# Patient Record
Sex: Male | Born: 1955 | Race: White | Hispanic: No | Marital: Married | Smoking: Former smoker
Health system: Southern US, Community
[De-identification: ages and names within clinical notes are randomized; demographics above are authoritative.]

## PROBLEM LIST (undated history)

## (undated) DIAGNOSIS — Z7982 Long term (current) use of aspirin: Secondary | ICD-10-CM

## (undated) DIAGNOSIS — Z955 Presence of coronary angioplasty implant and graft: Secondary | ICD-10-CM

## (undated) DIAGNOSIS — J449 Chronic obstructive pulmonary disease, unspecified: Secondary | ICD-10-CM

## (undated) DIAGNOSIS — K219 Gastro-esophageal reflux disease without esophagitis: Secondary | ICD-10-CM

## (undated) DIAGNOSIS — E785 Hyperlipidemia, unspecified: Secondary | ICD-10-CM

## (undated) DIAGNOSIS — C3490 Malignant neoplasm of unspecified part of unspecified bronchus or lung: Secondary | ICD-10-CM

## (undated) DIAGNOSIS — M1990 Unspecified osteoarthritis, unspecified site: Secondary | ICD-10-CM

## (undated) DIAGNOSIS — I10 Essential (primary) hypertension: Secondary | ICD-10-CM

## (undated) DIAGNOSIS — I700 Atherosclerosis of aorta: Secondary | ICD-10-CM

## (undated) DIAGNOSIS — I2510 Atherosclerotic heart disease of native coronary artery without angina pectoris: Secondary | ICD-10-CM

## (undated) DIAGNOSIS — E876 Hypokalemia: Secondary | ICD-10-CM

## (undated) DIAGNOSIS — N400 Enlarged prostate without lower urinary tract symptoms: Secondary | ICD-10-CM

## (undated) DIAGNOSIS — Z87891 Personal history of nicotine dependence: Secondary | ICD-10-CM

## (undated) DIAGNOSIS — K4020 Bilateral inguinal hernia, without obstruction or gangrene, not specified as recurrent: Secondary | ICD-10-CM

## (undated) DIAGNOSIS — K5790 Diverticulosis of intestine, part unspecified, without perforation or abscess without bleeding: Secondary | ICD-10-CM

## (undated) DIAGNOSIS — R918 Other nonspecific abnormal finding of lung field: Secondary | ICD-10-CM

## (undated) DIAGNOSIS — I219 Acute myocardial infarction, unspecified: Secondary | ICD-10-CM

## (undated) HISTORY — DX: Hypokalemia: E87.6

## (undated) HISTORY — PX: CARDIAC CATHETERIZATION: SHX172

## (undated) HISTORY — DX: Malignant neoplasm of unspecified part of unspecified bronchus or lung: C34.90

## (undated) HISTORY — PX: COLONOSCOPY: SHX174

## (undated) HISTORY — PX: KNEE ARTHROSCOPY: SUR90

## (undated) HISTORY — PX: UPPER GI ENDOSCOPY: SHX6162

## (undated) HISTORY — DX: Personal history of nicotine dependence: Z87.891

---

## 2011-10-02 ENCOUNTER — Ambulatory Visit: Payer: Self-pay | Admitting: Unknown Physician Specialty

## 2014-04-03 DIAGNOSIS — K219 Gastro-esophageal reflux disease without esophagitis: Secondary | ICD-10-CM | POA: Insufficient documentation

## 2014-04-24 DIAGNOSIS — I219 Acute myocardial infarction, unspecified: Secondary | ICD-10-CM

## 2014-04-24 DIAGNOSIS — I2109 ST elevation (STEMI) myocardial infarction involving other coronary artery of anterior wall: Secondary | ICD-10-CM

## 2014-04-24 HISTORY — DX: Acute myocardial infarction, unspecified: I21.9

## 2014-04-24 HISTORY — PX: CORONARY ANGIOPLASTY: SHX604

## 2014-04-24 HISTORY — DX: ST elevation (STEMI) myocardial infarction involving other coronary artery of anterior wall: I21.09

## 2014-05-03 ENCOUNTER — Encounter (HOSPITAL_COMMUNITY): Payer: Self-pay | Admitting: Cardiovascular Disease

## 2014-05-03 ENCOUNTER — Inpatient Hospital Stay (HOSPITAL_COMMUNITY)
Admission: EM | Admit: 2014-05-03 | Discharge: 2014-05-06 | DRG: 247 | Disposition: A | Discharge status: Home / Self Care | Payer: BC Managed Care – PPO | Source: Other Acute Inpatient Hospital | Attending: Cardiovascular Disease | Admitting: Cardiovascular Disease

## 2014-05-03 ENCOUNTER — Emergency Department: Payer: Self-pay | Admitting: Emergency Medicine

## 2014-05-03 ENCOUNTER — Encounter (HOSPITAL_COMMUNITY)
Admission: EM | Disposition: A | Discharge status: Home / Self Care | Payer: MEDICAID | Source: Other Acute Inpatient Hospital | Attending: Cardiovascular Disease

## 2014-05-03 DIAGNOSIS — Z8249 Family history of ischemic heart disease and other diseases of the circulatory system: Secondary | ICD-10-CM

## 2014-05-03 DIAGNOSIS — I2109 ST elevation (STEMI) myocardial infarction involving other coronary artery of anterior wall: Secondary | ICD-10-CM | POA: Insufficient documentation

## 2014-05-03 DIAGNOSIS — E876 Hypokalemia: Secondary | ICD-10-CM

## 2014-05-03 DIAGNOSIS — G4700 Insomnia, unspecified: Secondary | ICD-10-CM

## 2014-05-03 DIAGNOSIS — K219 Gastro-esophageal reflux disease without esophagitis: Secondary | ICD-10-CM | POA: Diagnosis present

## 2014-05-03 DIAGNOSIS — E780 Pure hypercholesterolemia: Secondary | ICD-10-CM | POA: Diagnosis present

## 2014-05-03 DIAGNOSIS — Z955 Presence of coronary angioplasty implant and graft: Secondary | ICD-10-CM

## 2014-05-03 DIAGNOSIS — I2510 Atherosclerotic heart disease of native coronary artery without angina pectoris: Secondary | ICD-10-CM | POA: Diagnosis present

## 2014-05-03 DIAGNOSIS — N400 Enlarged prostate without lower urinary tract symptoms: Secondary | ICD-10-CM | POA: Diagnosis present

## 2014-05-03 DIAGNOSIS — Z87891 Personal history of nicotine dependence: Secondary | ICD-10-CM

## 2014-05-03 DIAGNOSIS — Z9861 Coronary angioplasty status: Secondary | ICD-10-CM

## 2014-05-03 DIAGNOSIS — I10 Essential (primary) hypertension: Secondary | ICD-10-CM | POA: Diagnosis present

## 2014-05-03 DIAGNOSIS — I2102 ST elevation (STEMI) myocardial infarction involving left anterior descending coronary artery: Secondary | ICD-10-CM

## 2014-05-03 DIAGNOSIS — E785 Hyperlipidemia, unspecified: Secondary | ICD-10-CM

## 2014-05-03 DIAGNOSIS — I213 ST elevation (STEMI) myocardial infarction of unspecified site: Secondary | ICD-10-CM

## 2014-05-03 HISTORY — DX: Atherosclerotic heart disease of native coronary artery without angina pectoris: I25.10

## 2014-05-03 HISTORY — DX: Essential (primary) hypertension: I10

## 2014-05-03 HISTORY — DX: Hyperlipidemia, unspecified: E78.5

## 2014-05-03 HISTORY — DX: Gastro-esophageal reflux disease without esophagitis: K21.9

## 2014-05-03 HISTORY — DX: Benign prostatic hyperplasia without lower urinary tract symptoms: N40.0

## 2014-05-03 HISTORY — PX: LEFT HEART CATHETERIZATION WITH CORONARY ANGIOGRAM: SHX5451

## 2014-05-03 LAB — BRAIN NATRIURETIC PEPTIDE: B NATRIURETIC PEPTIDE 5: 20.1 pg/mL (ref 0.0–100.0)

## 2014-05-03 LAB — CBC WITH DIFFERENTIAL/PLATELET
BASOS PCT: 0 % (ref 0–1)
Basophils Absolute: 0 10*3/uL (ref 0.0–0.1)
EOS ABS: 0 10*3/uL (ref 0.0–0.7)
Eosinophils Relative: 0 % (ref 0–5)
HEMATOCRIT: 39.1 % (ref 39.0–52.0)
Hemoglobin: 13.3 g/dL (ref 13.0–17.0)
LYMPHS ABS: 1.3 10*3/uL (ref 0.7–4.0)
Lymphocytes Relative: 15 % (ref 12–46)
MCH: 31.4 pg (ref 26.0–34.0)
MCHC: 34 g/dL (ref 30.0–36.0)
MCV: 92.2 fL (ref 78.0–100.0)
Monocytes Absolute: 0.4 10*3/uL (ref 0.1–1.0)
Monocytes Relative: 5 % (ref 3–12)
NEUTROS PCT: 80 % — AB (ref 43–77)
Neutro Abs: 7.2 10*3/uL (ref 1.7–7.7)
Platelets: 218 10*3/uL (ref 150–400)
RBC: 4.24 MIL/uL (ref 4.22–5.81)
RDW: 13.3 % (ref 11.5–15.5)
WBC: 9 10*3/uL (ref 4.0–10.5)

## 2014-05-03 LAB — COMPREHENSIVE METABOLIC PANEL
ALT: 18 U/L (ref 0–53)
AST: 25 U/L (ref 0–37)
Albumin: 3.5 g/dL (ref 3.5–5.2)
Alkaline Phosphatase: 106 U/L (ref 39–117)
Anion gap: 11 (ref 5–15)
BUN: 7 mg/dL (ref 6–23)
CALCIUM: 9 mg/dL (ref 8.4–10.5)
CO2: 23 mmol/L (ref 19–32)
Chloride: 107 mEq/L (ref 96–112)
Creatinine, Ser: 0.99 mg/dL (ref 0.50–1.35)
GFR calc non Af Amer: 87 mL/min — ABNORMAL LOW (ref >90)
GLUCOSE: 113 mg/dL — AB (ref 70–99)
Potassium: 3.4 mmol/L — ABNORMAL LOW (ref 3.5–5.1)
SODIUM: 141 mmol/L (ref 135–145)
Total Bilirubin: 1 mg/dL (ref 0.3–1.2)
Total Protein: 6 g/dL (ref 6.0–8.3)

## 2014-05-03 LAB — BASIC METABOLIC PANEL
Anion Gap: 9 (ref 7–16)
BUN: 10 mg/dL (ref 7–18)
CHLORIDE: 107 mmol/L (ref 98–107)
CREATININE: 1.11 mg/dL (ref 0.60–1.30)
Calcium, Total: 8.3 mg/dL — ABNORMAL LOW (ref 8.5–10.1)
Co2: 26 mmol/L (ref 21–32)
EGFR (African American): >60
EGFR (Non-African Amer.): >60
GLUCOSE: 89 mg/dL (ref 65–99)
OSMOLALITY: 282 (ref 275–301)
POTASSIUM: 3.3 mmol/L — AB (ref 3.5–5.1)
SODIUM: 142 mmol/L (ref 136–145)

## 2014-05-03 LAB — CBC
HCT: 44.8 % (ref 40.0–52.0)
HGB: 15.1 g/dL (ref 13.0–18.0)
MCH: 32.4 pg (ref 26.0–34.0)
MCHC: 33.6 g/dL (ref 32.0–36.0)
MCV: 96 fL (ref 80–100)
Platelet: 234 10*3/uL (ref 150–440)
RBC: 4.65 10*6/uL (ref 4.40–5.90)
RDW: 13.3 % (ref 11.5–14.5)
WBC: 7.9 10*3/uL (ref 3.8–10.6)

## 2014-05-03 LAB — TROPONIN I
TROPONIN-I: 0.03 ng/mL
Troponin I: 0.92 ng/mL (ref <0.031)

## 2014-05-03 LAB — TSH: TSH: 1.007 u[IU]/mL (ref 0.350–4.500)

## 2014-05-03 LAB — MRSA PCR SCREENING: MRSA BY PCR: NEGATIVE

## 2014-05-03 SURGERY — LEFT HEART CATHETERIZATION WITH CORONARY ANGIOGRAM
Anesthesia: LOCAL

## 2014-05-03 MED ORDER — ATORVASTATIN CALCIUM 80 MG PO TABS
80.0000 mg | ORAL_TABLET | Freq: Every day | ORAL | Status: DC
  Administered 2014-05-03 – 2014-05-05 (×3): 80 mg via ORAL
  Filled 2014-05-03 (×4): qty 1

## 2014-05-03 MED ORDER — ASPIRIN 81 MG PO CHEW
81.0000 mg | CHEWABLE_TABLET | Freq: Every day | ORAL | Status: DC
  Administered 2014-05-04 – 2014-05-06 (×2): 81 mg via ORAL
  Filled 2014-05-03 (×2): qty 1

## 2014-05-03 MED ORDER — METOPROLOL TARTRATE 12.5 MG HALF TABLET
12.5000 mg | ORAL_TABLET | Freq: Two times a day (BID) | ORAL | Status: DC
  Administered 2014-05-03 – 2014-05-04 (×3): 12.5 mg via ORAL
  Filled 2014-05-03 (×5): qty 1

## 2014-05-03 MED ORDER — TICAGRELOR 90 MG PO TABS
90.0000 mg | ORAL_TABLET | Freq: Two times a day (BID) | ORAL | Status: DC
  Administered 2014-05-03 – 2014-05-06 (×6): 90 mg via ORAL
  Filled 2014-05-03 (×7): qty 1

## 2014-05-03 MED ORDER — MIDAZOLAM HCL 2 MG/2ML IJ SOLN
INTRAMUSCULAR | Status: AC
Start: 2014-05-03 — End: 2014-05-03
  Filled 2014-05-03: qty 2

## 2014-05-03 MED ORDER — SODIUM CHLORIDE 0.9 % IV SOLN: INTRAVENOUS | Status: DC

## 2014-05-03 MED ORDER — OXYCODONE-ACETAMINOPHEN 5-325 MG PO TABS: 1.0000 | ORAL_TABLET | ORAL | Status: DC | PRN

## 2014-05-03 MED ORDER — HEPARIN (PORCINE) IN NACL 2-0.9 UNIT/ML-% IJ SOLN
INTRAMUSCULAR | Status: AC
  Filled 2014-05-03: qty 1500

## 2014-05-03 MED ORDER — NITROGLYCERIN 0.4 MG SL SUBL
0.4000 mg | SUBLINGUAL_TABLET | SUBLINGUAL | Status: DC | PRN
Start: 2014-05-03 — End: 2014-05-06

## 2014-05-03 MED ORDER — LIDOCAINE HCL (PF) 1 % IJ SOLN
INTRAMUSCULAR | Status: AC
  Filled 2014-05-03: qty 30

## 2014-05-03 MED ORDER — BIVALIRUDIN 250 MG IV SOLR
INTRAVENOUS | Status: AC
  Filled 2014-05-03: qty 250

## 2014-05-03 MED ORDER — VERAPAMIL HCL 2.5 MG/ML IV SOLN
INTRAVENOUS | Status: AC
  Filled 2014-05-03: qty 2

## 2014-05-03 MED ORDER — MIDAZOLAM HCL 2 MG/2ML IJ SOLN
INTRAMUSCULAR | Status: AC
  Filled 2014-05-03: qty 2

## 2014-05-03 MED ORDER — NITROGLYCERIN 1 MG/10 ML FOR IR/CATH LAB
INTRA_ARTERIAL | Status: AC
Start: 2014-05-03 — End: 2014-05-03
  Filled 2014-05-03: qty 10

## 2014-05-03 MED ORDER — HEPARIN SODIUM (PORCINE) 5000 UNIT/ML IJ SOLN
5000.0000 [IU] | Freq: Three times a day (TID) | INTRAMUSCULAR | Status: DC
  Administered 2014-05-04 – 2014-05-05 (×4): 5000 [IU] via SUBCUTANEOUS
  Filled 2014-05-03 (×6): qty 1

## 2014-05-03 MED ORDER — HEPARIN SODIUM (PORCINE) 1000 UNIT/ML IJ SOLN
INTRAMUSCULAR | Status: AC
  Filled 2014-05-03: qty 1

## 2014-05-03 MED ORDER — FENTANYL CITRATE 0.05 MG/ML IJ SOLN
INTRAMUSCULAR | Status: AC
  Filled 2014-05-03: qty 2

## 2014-05-03 MED ORDER — ONDANSETRON HCL 4 MG/2ML IJ SOLN: 4.0000 mg | Freq: Four times a day (QID) | INTRAMUSCULAR | Status: DC | PRN

## 2014-05-03 MED ORDER — ACETAMINOPHEN 325 MG PO TABS: 650.0000 mg | ORAL_TABLET | ORAL | Status: DC | PRN

## 2014-05-03 NOTE — Progress Notes (Signed)
Chaplain received Code Stemi call for a patient transported from [facility]  but was unable to respond due to being in another crisis at the time.  Chaplain was able to speak to wife in the Cath Lab waiting area and offered comfort measures as well as informing Cath Lab Staff of wifes presence.  The patient went to [facility], the family was informed of the same. Follow-up by the Unit Chaplain would be appreciated.

## 2014-05-03 NOTE — CV Procedure (Signed)
Cardiac Catheterization Procedure Note  Name: Timothy Yoder MRN: [id number] DOB: 1955/12/02  Procedure: Left Heart Cath, Selective Coronary Angiography, LV angiography, PTCA and stenting of the mid-LAD  Indication: Anterolateral STEMI. Pt initially presented to [facility] and was diagnosed with an anterolateral STEMI. He was treated with IV heparin and a Brilinta bolus of 180 mg. He was transferred directly for cardiac cath and possible PCI.  Procedural Details:  The right wrist was prepped, draped, and anesthetized with 1% lidocaine. Using the modified Seldinger technique, a 5/6 French Slender sheath was introduced into the right radial artery. 3 mg of verapamil was administered through the sheath, weight-based unfractionated heparin was administered intravenously. Standard Judkins catheters were used for selective coronary angiography and left ventriculography. Ventriculography was performed after PCI. Catheter exchanges were performed over an exchange length guidewire.  PROCEDURAL FINDINGS Hemodynamics: AO 112/74 LV 110/11   Coronary angiography: Coronary dominance: right  Left mainstem: Left main is widely patent without stenosis. Divides into the LAD and left circumflex.  Left anterior descending (LAD): The LAD is diffusely diseased. It reaches the LV apex. The first diagonal has a 50% stenosis in its proximal body. The mid LAD has a 85% stenosis. I suspected this was the patient's culprit lesion based on location of injury current on EKG.  Left circumflex (LCx): The circumflex is patent. The ramus intermedius branches are fairly large and it divides into twin vessels. There is no significant stenosis. The first obtuse marginal is totally occluded. The vessel is very small and they occlusion has the appearance of a chronic occlusion.  Right coronary artery (RCA): Large, dominant vessel. The vessel has diffuse plaquing. The mid vessel has 20-30% stenosis. At the junction of  the mid and distal vessel there is a 70% hypodense lesion. The PDA is a large branching and has an eccentric 50% stenosis in its mid body. The PLA is a smaller branch and it has a 70% stenosis at the junction of the posterior AV segment and the second PLA branch.  Left ventriculography: There is mild hypokinesis of the LV apex. The other LV wall segments contract vigorously. The estimated LVEF is 60%.  PCI Note:  Following the diagnostic procedure, the decision was made to proceed with PCI of the mid LAD.  Weight-based bivalirudin was given for anticoagulation. Once a therapeutic ACT was achieved, a 6 [place] XB LAD guide catheter was inserted.  A cougar coronary guidewire was used to cross the lesion.  The lesion was predilated with a 2.5 x 12 mm balloon.  The lesion was then stented with a 2.75 x 16 mm Promus DES.  The stent was postdilated with a 2.75 mm noncompliant balloon to 18 atm.  Following PCI, there was 0% residual stenosis and TIMI-3 flow. Final angiography confirmed an excellent result. The patient tolerated the procedure well. There were no immediate procedural complications. A TR band was used for radial hemostasis. The patient was transferred to the post catheterization recovery area for further monitoring.  PCI Data: Vessel - LAD/Segment - mid Percent Stenosis (pre)  85 TIMI-flow 3 Stent 2.75x16 mm Promus DES Percent Stenosis (post) 0 TIMI-flow (post) 3  Contrast: 155 cc  Radiation dose/Fluoro time: 6.5 minutes  Estimated Blood Loss: minimal  Final Conclusions:   1. Acute anterior wall MI secondary to severe LAD stenosis. Suspect reperfusion occurred after heparin and brilinta was administered. 2. 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 3. Chronic occlusion of a  very small obtuse marginal branch, otherwise patency of the left circumflex. 4. Mild contraction abnormality of the left ventricle with preserved overall LV function.   Recommendations:   Dual antiplatelet therapy with aspirin and brilinta for at least 12 months. Post MI medical therapy. Consider staged PCI +/- FFR of the mid-RCA. Complete Trial would be a reasonable consideration.  Sherren Mocha MD, [facility] 05/03/2014, 5:06 PM

## 2014-05-03 NOTE — H&P (Signed)
History and Physical  Patient ID: Timothy Yoder MRN: [id number], SOB: 1955/07/24 59 y.o. Date of Encounter: 05/03/2014, 5:19 PM  Primary Physician: No primary care provider on file. Primary Cardiologist: none  Chief Complaint: Chest pain  HPI: 59 y.o. male w/ PMHx significant for GERD and BPH who presented to [facility] on 05/03/2014 direct to the cardiac cath lab as a Code STEMI. The patient initially presented to [facility] and was diagnosed with an anterolateral STEMI after presenting with chest pain. He was doing some yard work today when he developed substernal chest discomfort. There were no associated symptoms. The pain was nonradiating. He has a past history of similar symptoms. He does have a history of gastroesophageal reflux disease, but this pain was not similar to that. In the emergency department, he was treated with a heparin bolus and brilinta 180 mg. He was transferred here emergently as a code STEMI directed to the cardiac catheterization lab.  Past Medical History  Diagnosis Date  . Acute MI, anterolateral wall, initial episode of care 05/03/2014  . Gastroesophageal reflux disease   . BPH (benign prostatic hyperplasia)      Surgical History: No past surgical history on file.   Home Meds: Prior to Admission medications   Not on File    Allergies: No Known Allergies  History   Social History  . Marital Status: Married    Spouse Name: N/A    Number of Children: N/A  . Years of Education: N/A   Occupational History  . Not on file.   Social History Main Topics  . Smoking status: Not on file  . Smokeless tobacco: Not on file  . Alcohol Use: Not on file  . Drug Use: Not on file  . Sexual Activity: Not on file   Other Topics Concern  . Not on file   Social History Narrative   The patient is married. He lives in [place]. He works full-time. He quit smoking in 2013.     Family History  Problem Relation Age of Onset  . CAD Father 25    Died of  a massive MI  . CAD Mother 67    Died of an MI    Review of Systems: General: negative for chills, fever, night sweats or weight changes.  ENT: negative for rhinorrhea or epistaxis Cardiovascular: See history of present illness Dermatological: negative for rash Respiratory: negative for cough or wheezing GI: negative for nausea, vomiting, diarrhea, bright red blood per rectum, melena, or hematemesis. Positive for gastroesophageal reflux disease GU: no hematuria, urgency, or frequency Neurologic: negative for visual changes, syncope, headache, or dizziness Heme: no easy bruising or bleeding Endo: negative for excessive thirst, thyroid disorder, or flushing Musculoskeletal: negative for joint pain or swelling, negative for myalgias All other systems reviewed and are otherwise negative except as noted above.  Physical Exam: Heart rate 70 bpm, blood pressure 112/74, respiratory rate 20 General: Well developed, well nourished, alert and oriented, in no acute distress. HEENT: Normocephalic, atraumatic, sclera non-icteric, no xanthomas, nares are without discharge.  Neck: Supple. Carotids 2+ without bruits. JVP normal Lungs: Clear bilaterally to auscultation without wheezes, rales, or rhonchi. Breathing is unlabored. Heart: RRR with normal S1 and S2. No murmurs, rubs, or gallops appreciated. Abdomen: Soft, non-tender, non-distended with normoactive bowel sounds. No hepatomegaly. No rebound/guarding. No obvious abdominal masses. Back: No CVA tenderness Msk:  Strength and tone appear normal for age. Extremities: No clubbing, cyanosis, or edema.  Distal pedal pulses  are 2+ and equal bilaterally. Neuro: CNII-XII intact, moves all extremities spontaneously. Psych:  Responds to questions appropriately with a normal affect.   Labs:  No results found for: WBC, HGB, HCT, MCV, PLT No results for input(s): NA, K, CL, CO2, BUN, CREATININE, CALCIUM, PROT, BILITOT, ALKPHOS, ALT, AST, GLUCOSE in the last  168 hours.  Invalid input(s): LABALBU No results for input(s): CKTOTAL, CKMB, TROPONINI in the last 72 hours. No results found for: CHOL, HDL, LDLCALC, TRIG No results found for: DDIMER  Radiology/Studies:  No results found.   EKG: Normal sinus rhythm with an acute anterolateral injury current  ASSESSMENT AND PLAN:  Acute anterolateral STEMI. The patient has been appropriately treated with heparin and brilinta. He will go directly to cardiac catheterization and possible PCI. I have reviewed the risks, indications, and alternatives to this procedure. He understands and agrees to proceed. Emergency implied consent was obtained. Further disposition pending the results of his cardiac catheterization. Routine post MI medical therapy will be initiated.  Will begin high-dose statin with atorvastatin 80 mg  Will start a beta blocker low-dose  The patient will need dual antiplatelet therapy with aspirin and brilinta  Signed, Sherren Mocha MD 05/03/2014, 5:19 PM

## 2014-05-04 ENCOUNTER — Inpatient Hospital Stay (HOSPITAL_COMMUNITY): Payer: BC Managed Care – PPO

## 2014-05-04 ENCOUNTER — Encounter (HOSPITAL_COMMUNITY): Payer: Self-pay | Admitting: Neurology

## 2014-05-04 LAB — BASIC METABOLIC PANEL
Anion gap: 4 — ABNORMAL LOW (ref 5–15)
BUN: 9 mg/dL (ref 6–23)
CO2: 26 mmol/L (ref 19–32)
Calcium: 8.6 mg/dL (ref 8.4–10.5)
Chloride: 108 mEq/L (ref 96–112)
Creatinine, Ser: 0.99 mg/dL (ref 0.50–1.35)
GFR calc Af Amer: >90 mL/min (ref >90)
GFR calc non Af Amer: 87 mL/min — ABNORMAL LOW (ref >90)
Glucose, Bld: 95 mg/dL (ref 70–99)
POTASSIUM: 3.4 mmol/L — AB (ref 3.5–5.1)
Sodium: 138 mmol/L (ref 135–145)

## 2014-05-04 LAB — CBC
HCT: 38.9 % — ABNORMAL LOW (ref 39.0–52.0)
Hemoglobin: 13.7 g/dL (ref 13.0–17.0)
MCH: 33.3 pg (ref 26.0–34.0)
MCHC: 35.2 g/dL (ref 30.0–36.0)
MCV: 94.4 fL (ref 78.0–100.0)
Platelets: 217 10*3/uL (ref 150–400)
RBC: 4.12 MIL/uL — AB (ref 4.22–5.81)
RDW: 13.4 % (ref 11.5–15.5)
WBC: 7.7 10*3/uL (ref 4.0–10.5)

## 2014-05-04 LAB — LIPID PANEL
CHOLESTEROL: 167 mg/dL (ref 0–200)
HDL: 27 mg/dL — AB (ref >39)
LDL Cholesterol: 123 mg/dL — ABNORMAL HIGH (ref 0–99)
TRIGLYCERIDES: 87 mg/dL (ref <150)
Total CHOL/HDL Ratio: 6.2 RATIO
VLDL: 17 mg/dL (ref 0–40)

## 2014-05-04 LAB — POCT I-STAT, CHEM 8
BUN: 9 mg/dL (ref 6–23)
CHLORIDE: 96 meq/L (ref 96–112)
Calcium, Ion: 1.19 mmol/L (ref 1.13–1.30)
Creatinine, Ser: 0.8 mg/dL (ref 0.50–1.35)
Glucose, Bld: 106 mg/dL — ABNORMAL HIGH (ref 70–99)
HCT: 40 % (ref 39.0–52.0)
Hemoglobin: 13.6 g/dL (ref 13.0–17.0)
Potassium: 3.1 mmol/L — ABNORMAL LOW (ref 3.5–5.1)
Sodium: 135 mmol/L (ref 135–145)
TCO2: 20 mmol/L (ref 0–100)

## 2014-05-04 LAB — TROPONIN I
Troponin I: 3.23 ng/mL (ref <0.031)
Troponin I: 3.9 ng/mL (ref <0.031)

## 2014-05-04 LAB — POCT ACTIVATED CLOTTING TIME: Activated Clotting Time: 435 seconds

## 2014-05-04 MED ORDER — TAMSULOSIN HCL 0.4 MG PO CAPS
0.4000 mg | ORAL_CAPSULE | Freq: Every day | ORAL | Status: DC
  Administered 2014-05-04 – 2014-05-05 (×2): 0.4 mg via ORAL
  Filled 2014-05-04 (×3): qty 1

## 2014-05-04 MED ORDER — PANTOPRAZOLE SODIUM 40 MG PO TBEC
80.0000 mg | DELAYED_RELEASE_TABLET | Freq: Every day | ORAL | Status: DC
  Administered 2014-05-04 – 2014-05-06 (×3): 80 mg via ORAL
  Filled 2014-05-04 (×4): qty 2

## 2014-05-04 MED ORDER — POTASSIUM CHLORIDE CRYS ER 20 MEQ PO TBCR
20.0000 meq | EXTENDED_RELEASE_TABLET | Freq: Two times a day (BID) | ORAL | Status: DC
  Administered 2014-05-04 – 2014-05-06 (×5): 20 meq via ORAL
  Filled 2014-05-04 (×6): qty 1

## 2014-05-04 MED ORDER — ASPIRIN 81 MG PO CHEW: 81.0000 mg | CHEWABLE_TABLET | ORAL | Status: AC

## 2014-05-04 MED ORDER — SODIUM CHLORIDE 0.9 % IJ SOLN: 3.0000 mL | INTRAMUSCULAR | Status: DC | PRN

## 2014-05-04 MED ORDER — SODIUM CHLORIDE 0.9 % IV SOLN: 250.0000 mL | INTRAVENOUS | Status: DC | PRN

## 2014-05-04 MED ORDER — CETYLPYRIDINIUM CHLORIDE 0.05 % MT LIQD
7.0000 mL | Freq: Two times a day (BID) | OROMUCOSAL | Status: DC
  Administered 2014-05-04: 7 mL via OROMUCOSAL

## 2014-05-04 MED ORDER — SODIUM CHLORIDE 0.9 % IJ SOLN
3.0000 mL | Freq: Two times a day (BID) | INTRAMUSCULAR | Status: DC
  Administered 2014-05-04 – 2014-05-05 (×2): 3 mL via INTRAVENOUS

## 2014-05-04 MED ORDER — LOSARTAN POTASSIUM 25 MG PO TABS
25.0000 mg | ORAL_TABLET | Freq: Every day | ORAL | Status: DC
  Administered 2014-05-04 – 2014-05-06 (×3): 25 mg via ORAL
  Filled 2014-05-04 (×3): qty 1

## 2014-05-04 MED FILL — Sodium Chloride IV Soln 0.9%: INTRAVENOUS | Qty: 50 | Status: AC

## 2014-05-04 NOTE — Progress Notes (Signed)
Patient Name: Timothy Yoder Date of Encounter: 05/04/2014     Active Problems:   Acute MI, anterolateral wall, initial episode of care   Acute anterolateral myocardial infarction    SUBJECTIVE  The patient is not having any recurrent chest discomfort.  His potassium is slightly low and we will replete this.  Blood pressure running high and we will add low-dose losartan. The patient was considered for the complete trial but has declined.  He would prefer to proceed and have his follow-up cardiac catheterization during this admission for staged PCI of his moderately severe stenosis of the mid right coronary artery.  We will schedule this for Tuesday, January 12 with Dr. Martinique in Dr. Antionette Char absence.  CURRENT MEDS . antiseptic oral rinse  7 mL Mouth Rinse BID  . aspirin  81 mg Oral Daily  . atorvastatin  80 mg Oral q1800  . heparin  5,000 Units Subcutaneous 3 times per day  . metoprolol tartrate  12.5 mg Oral BID  . ticagrelor  90 mg Oral BID    OBJECTIVE  Filed Vitals:   05/04/14 0300 05/04/14 0400 05/04/14 0600 05/04/14 0800  BP:  125/82 132/90 158/95  Pulse: 66 62 60 77  Temp: 98.4 F (36.9 C)   98.6 F (37 C)  TempSrc: Oral   Oral  Resp: 13 13 13 16   Height:      Weight:      SpO2: 97% 97% 98% 99%    Intake/Output Summary (Last 24 hours) at 05/04/14 1141 Last data filed at 05/04/14 0900  Gross per 24 hour  Intake 894.25 ml  Output    650 ml  Net 244.25 ml   Filed Weights   05/03/14 1745  Weight: 161 lb 13.1 oz (73.4 kg)    PHYSICAL EXAM  General: Pleasant, NAD. Neuro: Alert and oriented X 3. Moves all extremities spontaneously. Psych: Normal affect. HEENT:  Normal  Neck: Supple without bruits or JVD. Lungs:  Resp regular and unlabored, CTA. Heart: RRR no s3, s4, or murmurs. Abdomen: Soft, non-tender, non-distended, BS + x 4.  Extremities: No clubbing, cyanosis or edema. DP/PT/Radials 2+ and equal bilaterally.  Accessory Clinical  Findings  CBC  Recent Labs  05/03/14 1928 05/04/14 0033  WBC 9.0 7.7  NEUTROABS 7.2  --   HGB 13.3 13.7  HCT 39.1 38.9*  MCV 92.2 94.4  PLT 218 272   Basic Metabolic Panel  Recent Labs  05/03/14 1928 05/04/14 0033  NA 141 138  K 3.4* 3.4*  CL 107 108  CO2 23 26  GLUCOSE 113* 95  BUN 7 9  CREATININE 0.99 0.99  CALCIUM 9.0 8.6   Liver Function Tests  Recent Labs  05/03/14 1928  AST 25  ALT 18  ALKPHOS 106  BILITOT 1.0  PROT 6.0  ALBUMIN 3.5   No results for input(s): LIPASE, AMYLASE in the last 72 hours. Cardiac Enzymes  Recent Labs  05/03/14 1928 05/04/14 0033 05/04/14 0735  TROPONINI 0.92* 3.23* 3.90*   BNP Invalid input(s): POCBNP D-Dimer No results for input(s): DDIMER in the last 72 hours. Hemoglobin A1C No results for input(s): HGBA1C in the last 72 hours. Fasting Lipid Panel  Recent Labs  05/04/14 0033  CHOL 167  HDL 27*  LDLCALC 123*  TRIG 87  CHOLHDL 6.2   Thyroid Function Tests  Recent Labs  05/03/14 1930  TSH 1.007    TELE  Normal sinus rhythm  ECG  Normal sinus rhythm Low voltage QRS  Cannot rule out Anteroseptal infarct , age undetermined Abnormal ECG  Radiology/Studies  No results found.  ASSESSMENT AND PLAN  1. Acute anterior wall MI secondary to severe LAD stenosis. Suspect reperfusion occurred after heparin and brilinta was administered. 2. 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 3. 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 4. Mild contraction abnormality of the left ventricle with preserved overall LV function.  5.  Hypercholesterolemia now on statin therapy 6.  Labile hypertension.  Continue low-dose beta blocker.  Add low-dose losartan. 7.  Hypokalemia.  Replete.  Check BMET in a.m. Recommendations:  Dual antiplatelet therapy with aspirin and brilinta for at least 12 months. Post MI medical therapy. Consider  staged PCI +/- FFR of the mid-RCA. Complete Trial would be a reasonable consideration.  However the patient declines to be randomized.  He would like to proceed with cardiac catheterization during this admission.  I will arrange this for tomorrow with Dr. Martinique in Dr. Antionette Char absence.  Signed, Darlin Coco MD

## 2014-05-04 NOTE — Progress Notes (Addendum)
Came to ambulate pt however BP 179/99. He received 12.5 Lopressor 1 hr ago. Pt sts he didn't sleep well and has many visitors right now. Will hold ambulation and he can walk with family later when BP lower. Discussed with pt and family MI, stent, Brilinta, restrictions. Good reception. Will f/u for ambulation and more ed tomorrow. [place], ACSM 11:32 AM 05/04/2014

## 2014-05-04 NOTE — Care Management Note (Addendum)
    Page 1 of 1   05/06/2014     11:22:51 AM CARE MANAGEMENT NOTE 05/06/2014  Patient:  Timothy Yoder, Timothy Yoder   Account Number:  [id number]  Date Initiated:  05/04/2014  Documentation initiated by:  Timothy Yoder  Subjective/Objective Assessment:   adm w mi     Action/Plan:   lives w wife, pcp [facility] in [place] Date:     Anticipated DC Plan:  [place]  CM consult  Medication Assistance      Choice offered to / List presented to:             Status of service:   Medicare Important Message given?  NO (If response is "NO", the following Medicare IM given date fields will be blank) Date Medicare IM given:   Medicare IM given by:   Date Additional Medicare IM given:   Additional Medicare IM given by:    Discharge Disposition:  HOME/SELF CARE  Per UR Regulation:  Reviewed for med. necessity/level of care/duration of stay  If discussed at [place] of Stay Meetings, dates discussed:    Comments:   05-06-14 [place], RN, BSN [phone] CM did call [facility] in [place] and medication Timothy Yoder is available. No further needs from CM at this time.   1/11 1031a Timothy dowell rn,bsn spoke w pt and wife. he has pcp w [facility]. he has [employer] ins. gave him 30day free and copay assist card for brilinta.

## 2014-05-05 ENCOUNTER — Encounter (HOSPITAL_COMMUNITY): Payer: Self-pay | Admitting: Cardiology

## 2014-05-05 ENCOUNTER — Encounter (HOSPITAL_COMMUNITY)
Admission: EM | Disposition: A | Discharge status: Home / Self Care | Payer: BC Managed Care – PPO | Source: Other Acute Inpatient Hospital | Attending: Cardiovascular Disease

## 2014-05-05 DIAGNOSIS — E780 Pure hypercholesterolemia: Secondary | ICD-10-CM

## 2014-05-05 HISTORY — PX: PERCUTANEOUS CORONARY STENT INTERVENTION (PCI-S): SHX5485

## 2014-05-05 LAB — CBC
HEMATOCRIT: 38.9 % — AB (ref 39.0–52.0)
Hemoglobin: 13.4 g/dL (ref 13.0–17.0)
MCH: 32.1 pg (ref 26.0–34.0)
MCHC: 34.4 g/dL (ref 30.0–36.0)
MCV: 93.3 fL (ref 78.0–100.0)
Platelets: 220 10*3/uL (ref 150–400)
RBC: 4.17 MIL/uL — ABNORMAL LOW (ref 4.22–5.81)
RDW: 13.4 % (ref 11.5–15.5)
WBC: 7.1 10*3/uL (ref 4.0–10.5)

## 2014-05-05 LAB — BASIC METABOLIC PANEL
Anion gap: 5 (ref 5–15)
BUN: 9 mg/dL (ref 6–23)
CHLORIDE: 107 meq/L (ref 96–112)
CO2: 28 mmol/L (ref 19–32)
Calcium: 8.8 mg/dL (ref 8.4–10.5)
Creatinine, Ser: 1.07 mg/dL (ref 0.50–1.35)
GFR calc Af Amer: 85 mL/min — ABNORMAL LOW (ref >90)
GFR, EST NON AFRICAN AMERICAN: 74 mL/min — AB (ref >90)
Glucose, Bld: 91 mg/dL (ref 70–99)
POTASSIUM: 3.7 mmol/L (ref 3.5–5.1)
SODIUM: 140 mmol/L (ref 135–145)

## 2014-05-05 LAB — PROTIME-INR
INR: 1.07 (ref 0.00–1.49)
Prothrombin Time: 14 seconds (ref 11.6–15.2)

## 2014-05-05 LAB — POCT ACTIVATED CLOTTING TIME: Activated Clotting Time: 276 seconds

## 2014-05-05 SURGERY — PERCUTANEOUS CORONARY STENT INTERVENTION (PCI-S)

## 2014-05-05 MED ORDER — METOPROLOL TARTRATE 25 MG PO TABS: 25.0000 mg | ORAL_TABLET | Freq: Two times a day (BID) | ORAL | Status: DC

## 2014-05-05 MED ORDER — VERAPAMIL HCL 2.5 MG/ML IV SOLN
INTRAVENOUS | Status: AC
  Filled 2014-05-05: qty 2

## 2014-05-05 MED ORDER — ADENOSINE 12 MG/4ML IV SOLN
12.0000 mL | Freq: Once | INTRAVENOUS | Status: DC
  Filled 2014-05-05: qty 12

## 2014-05-05 MED ORDER — HEPARIN SODIUM (PORCINE) 5000 UNIT/ML IJ SOLN
5000.0000 [IU] | Freq: Three times a day (TID) | INTRAMUSCULAR | Status: DC
Start: 2014-05-06 — End: 2014-05-06
  Administered 2014-05-06: 5000 [IU] via SUBCUTANEOUS
  Filled 2014-05-05: qty 1

## 2014-05-05 MED ORDER — FENTANYL CITRATE 0.05 MG/ML IJ SOLN
INTRAMUSCULAR | Status: AC
  Filled 2014-05-05: qty 2

## 2014-05-05 MED ORDER — HEPARIN (PORCINE) IN NACL 2-0.9 UNIT/ML-% IJ SOLN
INTRAMUSCULAR | Status: AC
  Filled 2014-05-05: qty 1500

## 2014-05-05 MED ORDER — NITROGLYCERIN 1 MG/10 ML FOR IR/CATH LAB
INTRA_ARTERIAL | Status: AC
  Filled 2014-05-05: qty 10

## 2014-05-05 MED ORDER — SODIUM CHLORIDE 0.9 % IV SOLN: 1.0000 mL/kg/h | INTRAVENOUS | Status: DC

## 2014-05-05 MED ORDER — SODIUM CHLORIDE 0.9 % IV SOLN
1.0000 mL/kg/h | INTRAVENOUS | Status: AC
  Administered 2014-05-05: 1 mL/kg/h via INTRAVENOUS

## 2014-05-05 MED ORDER — METOPROLOL TARTRATE 25 MG PO TABS
25.0000 mg | ORAL_TABLET | Freq: Two times a day (BID) | ORAL | Status: DC
  Administered 2014-05-05 – 2014-05-06 (×3): 25 mg via ORAL
  Filled 2014-05-05 (×3): qty 1

## 2014-05-05 MED ORDER — LIDOCAINE HCL (PF) 1 % IJ SOLN
INTRAMUSCULAR | Status: AC
  Filled 2014-05-05: qty 30

## 2014-05-05 MED ORDER — ZOLPIDEM TARTRATE 5 MG PO TABS: 5.0000 mg | ORAL_TABLET | Freq: Every evening | ORAL | Status: DC | PRN

## 2014-05-05 MED ORDER — HEPARIN SODIUM (PORCINE) 1000 UNIT/ML IJ SOLN
INTRAMUSCULAR | Status: AC
  Filled 2014-05-05: qty 1

## 2014-05-05 MED ORDER — MIDAZOLAM HCL 2 MG/2ML IJ SOLN
INTRAMUSCULAR | Status: AC
  Filled 2014-05-05: qty 2

## 2014-05-05 NOTE — CV Procedure (Signed)
    CARDIAC CATH NOTE  Name: Timothy Yoder MRN: [id number] DOB: 04/05/1956  Procedure: FFR of the RCA  Indication: 59 yo WM presented with an anterior MI treated with stenting of the LAD. There was an eccentric 70% lesion in the mid RCA. FFR planned to decide functional significance.  Procedural Details: The right wrist was prepped, draped, and anesthetized with 1% lidocaine. Using the modified Seldinger technique, a 6 Fr slender sheath was introduced into the radial artery. 3 mg verapamil was administered through the radial sheath. Weight-based heparin was given for anticoagulation. Once a therapeutic ACT was achieved, a 6 [place] FR4 guide catheter was inserted.  A Flow wire was used to cross the lesion.  The patient was given IV Adenosine infusion per protocol for maximal hyperemia. FFR was measured at 0.90. This indicates that the lesion is not hemodynamically significant. The patient tolerated the procedure well. There were no immediate procedural complications. A TR band was used for radial hemostasis. The patient was transferred to the post catheterization recovery area for further monitoring.  Conclusions: FFR of 0.90 of the mid RCA  Recommendations: medical management.  Devlin Mcveigh [place], [place] 05/05/2014, 4:22 PM

## 2014-05-05 NOTE — Interval H&P Note (Signed)
History and Physical Interval Note:  05/05/2014 3:27 PM  Timothy Yoder  has presented today for surgery, with the diagnosis of cad  The various methods of treatment have been discussed with the patient and family. After consideration of risks, benefits and other options for treatment, the patient has consented to  Procedure(s): PERCUTANEOUS CORONARY STENT INTERVENTION (PCI-S) (N/A) as a surgical intervention .  The patient's history has been reviewed, patient examined, no change in status, stable for surgery.  I have reviewed the patient's chart and labs.  Questions were answered to the patient's satisfaction.   Cath Lab Visit (complete for each Cath Lab visit)  Clinical Evaluation Leading to the Procedure:   ACS: Yes.    Non-ACS:    Anginal Classification: CCS II  Anti-ischemic medical therapy: Minimal Therapy (1 class of medications)  Non-Invasive Test Results: No non-invasive testing performed  Prior CABG: No previous CABG        Timothy Yoder 05/05/2014 3:27 PM

## 2014-05-05 NOTE — H&P (View-Only) (Signed)
Patient Name: Timothy Yoder Date of Encounter: 05/05/2014     Active Problems:   Acute MI, anterolateral wall, initial episode of care   Acute anterolateral myocardial infarction    SUBJECTIVE  Patient feels well.  He is not having any chest discomfort.  His blood pressure is still labile and is high at times.  He has not been sleeping well here in the hospital.  He is awaiting repeat cardiac catheterization this afternoon.  CURRENT MEDS . aspirin  81 mg Oral Daily  . atorvastatin  80 mg Oral q1800  . heparin  5,000 Units Subcutaneous 3 times per day  . losartan  25 mg Oral Daily  . metoprolol tartrate  12.5 mg Oral BID  . pantoprazole  80 mg Oral Daily  . potassium chloride  20 mEq Oral BID  . sodium chloride  3 mL Intravenous Q12H  . tamsulosin  0.4 mg Oral QPC supper  . ticagrelor  90 mg Oral BID    OBJECTIVE  Filed Vitals:   05/04/14 2345 05/04/14 2347 05/05/14 0436 05/05/14 0500  BP:  119/74 151/86   Pulse:  79 81   Temp: 98.5 F (36.9 C)   98 F (36.7 C)  TempSrc: Oral   Oral  Resp:  18 18   Height:      Weight:      SpO2:  95% 95% 96%    Intake/Output Summary (Last 24 hours) at 05/05/14 0823 Last data filed at 05/05/14 0700  Gross per 24 hour  Intake  903.5 ml  Output    300 ml  Net  603.5 ml   Filed Weights   05/03/14 1745  Weight: 161 lb 13.1 oz (73.4 kg)    PHYSICAL EXAM  General: Pleasant, NAD. Neuro: Alert and oriented X 3. Moves all extremities spontaneously. Psych: Normal affect. HEENT:  Normal  Neck: Supple without bruits or JVD. Lungs:  Resp regular and unlabored, CTA. Heart: RRR no s3, s4, or murmurs. Abdomen: Soft, non-tender, non-distended, BS + x 4.  Extremities: No clubbing, cyanosis or edema. DP/PT/Radials 2+ and equal bilaterally.  Accessory Clinical Findings  CBC  Recent Labs  05/03/14 1928 05/04/14 0033  WBC 9.0 7.7  NEUTROABS 7.2  --   HGB 13.3 13.7  HCT 39.1 38.9*  MCV 92.2 94.4  PLT 218 725   Basic  Metabolic Panel  Recent Labs  05/04/14 0033 05/05/14 0241  NA 138 140  K 3.4* 3.7  CL 108 107  CO2 26 28  GLUCOSE 95 91  BUN 9 9  CREATININE 0.99 1.07  CALCIUM 8.6 8.8   Liver Function Tests  Recent Labs  05/03/14 1928  AST 25  ALT 18  ALKPHOS 106  BILITOT 1.0  PROT 6.0  ALBUMIN 3.5   No results for input(s): LIPASE, AMYLASE in the last 72 hours. Cardiac Enzymes  Recent Labs  05/03/14 1928 05/04/14 0033 05/04/14 0735  TROPONINI 0.92* 3.23* 3.90*   BNP Invalid input(s): POCBNP D-Dimer No results for input(s): DDIMER in the last 72 hours. Hemoglobin A1C No results for input(s): HGBA1C in the last 72 hours. Fasting Lipid Panel  Recent Labs  05/04/14 0033  CHOL 167  HDL 27*  LDLCALC 123*  TRIG 87  CHOLHDL 6.2   Thyroid Function Tests  Recent Labs  05/03/14 1930  TSH 1.007    TELE  Normal sinus rhythm  ECG  Personally reviewed.  EKG today shows significant increase in widespread lateral T-wave inversion since yesterday.  Radiology/Studies  Dg Chest Port 1v Same Day  05/04/2014   CLINICAL DATA:  Myocardial infarction. Congestive heart failure yesterday. Feeling better today.  EXAM: PORTABLE CHEST - 1 VIEW SAME DAY  COMPARISON:  None.  FINDINGS: The cardiac silhouette, mediastinal and hilar contours are within normal limits. The lungs are clear. No pulmonary edema or pleural effusions. The bony thorax is intact.  IMPRESSION: No acute cardiopulmonary findings.   Electronically Signed   By: Kalman Jewels M.D.   On: 05/04/2014 12:50    ASSESSMENT AND PLAN 1. Acute anterior wall MI secondary to severe LAD stenosis. Suspect reperfusion occurred after heparin and brilinta was administered. 2. 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 3. 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 4. Mild contraction abnormality of the left ventricle with preserved overall LV  function.  5. Hypercholesterolemia now on statin therapy 6. Labile hypertension. Will increase dose of beta blocker. Continue losartan. 7. Hypokalemia. Improved after repletion yesterday 8.  Insomnia.  Will add low-dose Ambien  Plan: Repeat cardiac catheterization with probable PCI of right coronary artery lesion today by Dr. Martinique.  Possibly home tomorrow if stable.  Signed, Darlin Coco MD

## 2014-05-05 NOTE — Progress Notes (Signed)
Patient Name: Timothy Yoder Date of Encounter: 05/05/2014     Active Problems:   Acute MI, anterolateral wall, initial episode of care   Acute anterolateral myocardial infarction    SUBJECTIVE  Patient feels well.  He is not having any chest discomfort.  His blood pressure is still labile and is high at times.  He has not been sleeping well here in the hospital.  He is awaiting repeat cardiac catheterization this afternoon.  CURRENT MEDS . aspirin  81 mg Oral Daily  . atorvastatin  80 mg Oral q1800  . heparin  5,000 Units Subcutaneous 3 times per day  . losartan  25 mg Oral Daily  . metoprolol tartrate  12.5 mg Oral BID  . pantoprazole  80 mg Oral Daily  . potassium chloride  20 mEq Oral BID  . sodium chloride  3 mL Intravenous Q12H  . tamsulosin  0.4 mg Oral QPC supper  . ticagrelor  90 mg Oral BID    OBJECTIVE  Filed Vitals:   05/04/14 2345 05/04/14 2347 05/05/14 0436 05/05/14 0500  BP:  119/74 151/86   Pulse:  79 81   Temp: 98.5 F (36.9 C)   98 F (36.7 C)  TempSrc: Oral   Oral  Resp:  18 18   Height:      Weight:      SpO2:  95% 95% 96%    Intake/Output Summary (Last 24 hours) at 05/05/14 0823 Last data filed at 05/05/14 0700  Gross per 24 hour  Intake  903.5 ml  Output    300 ml  Net  603.5 ml   Filed Weights   05/03/14 1745  Weight: 161 lb 13.1 oz (73.4 kg)    PHYSICAL EXAM  General: Pleasant, NAD. Neuro: Alert and oriented X 3. Moves all extremities spontaneously. Psych: Normal affect. HEENT:  Normal  Neck: Supple without bruits or JVD. Lungs:  Resp regular and unlabored, CTA. Heart: RRR no s3, s4, or murmurs. Abdomen: Soft, non-tender, non-distended, BS + x 4.  Extremities: No clubbing, cyanosis or edema. DP/PT/Radials 2+ and equal bilaterally.  Accessory Clinical Findings  CBC  Recent Labs  05/03/14 1928 05/04/14 0033  WBC 9.0 7.7  NEUTROABS 7.2  --   HGB 13.3 13.7  HCT 39.1 38.9*  MCV 92.2 94.4  PLT 218 409   Basic  Metabolic Panel  Recent Labs  05/04/14 0033 05/05/14 0241  NA 138 140  K 3.4* 3.7  CL 108 107  CO2 26 28  GLUCOSE 95 91  BUN 9 9  CREATININE 0.99 1.07  CALCIUM 8.6 8.8   Liver Function Tests  Recent Labs  05/03/14 1928  AST 25  ALT 18  ALKPHOS 106  BILITOT 1.0  PROT 6.0  ALBUMIN 3.5   No results for input(s): LIPASE, AMYLASE in the last 72 hours. Cardiac Enzymes  Recent Labs  05/03/14 1928 05/04/14 0033 05/04/14 0735  TROPONINI 0.92* 3.23* 3.90*   BNP Invalid input(s): POCBNP D-Dimer No results for input(s): DDIMER in the last 72 hours. Hemoglobin A1C No results for input(s): HGBA1C in the last 72 hours. Fasting Lipid Panel  Recent Labs  05/04/14 0033  CHOL 167  HDL 27*  LDLCALC 123*  TRIG 87  CHOLHDL 6.2   Thyroid Function Tests  Recent Labs  05/03/14 1930  TSH 1.007    TELE  Normal sinus rhythm  ECG  Personally reviewed.  EKG today shows significant increase in widespread lateral T-wave inversion since yesterday.  Radiology/Studies  Dg Chest Port 1v Same Day  05/04/2014   CLINICAL DATA:  Myocardial infarction. Congestive heart failure yesterday. Feeling better today.  EXAM: PORTABLE CHEST - 1 VIEW SAME DAY  COMPARISON:  None.  FINDINGS: The cardiac silhouette, mediastinal and hilar contours are within normal limits. The lungs are clear. No pulmonary edema or pleural effusions. The bony thorax is intact.  IMPRESSION: No acute cardiopulmonary findings.   Electronically Signed   By: Kalman Jewels M.D.   On: 05/04/2014 12:50    ASSESSMENT AND PLAN 1. Acute anterior wall MI secondary to severe LAD stenosis. Suspect reperfusion occurred after heparin and brilinta was administered. 2. 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 3. 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 4. Mild contraction abnormality of the left ventricle with preserved overall LV  function.  5. Hypercholesterolemia now on statin therapy 6. Labile hypertension. Will increase dose of beta blocker. Continue losartan. 7. Hypokalemia. Improved after repletion yesterday 8.  Insomnia.  Will add low-dose Ambien  Plan: Repeat cardiac catheterization with probable PCI of right coronary artery lesion today by Dr. [place].  Possibly home tomorrow if stable.  Signed, Darlin Coco MD

## 2014-05-06 ENCOUNTER — Encounter (HOSPITAL_COMMUNITY): Payer: Self-pay | Admitting: Nurse Practitioner

## 2014-05-06 ENCOUNTER — Telehealth: Payer: Self-pay | Admitting: Physician Assistant

## 2014-05-06 ENCOUNTER — Other Ambulatory Visit: Payer: Self-pay | Admitting: Nurse Practitioner

## 2014-05-06 DIAGNOSIS — E876 Hypokalemia: Secondary | ICD-10-CM

## 2014-05-06 DIAGNOSIS — I2109 ST elevation (STEMI) myocardial infarction involving other coronary artery of anterior wall: Principal | ICD-10-CM

## 2014-05-06 DIAGNOSIS — I2510 Atherosclerotic heart disease of native coronary artery without angina pectoris: Secondary | ICD-10-CM

## 2014-05-06 DIAGNOSIS — E785 Hyperlipidemia, unspecified: Secondary | ICD-10-CM

## 2014-05-06 DIAGNOSIS — G4700 Insomnia, unspecified: Secondary | ICD-10-CM

## 2014-05-06 DIAGNOSIS — I10 Essential (primary) hypertension: Secondary | ICD-10-CM

## 2014-05-06 DIAGNOSIS — Z9861 Coronary angioplasty status: Secondary | ICD-10-CM

## 2014-05-06 MED ORDER — METOPROLOL TARTRATE 25 MG PO TABS: 25.0000 mg | ORAL_TABLET | Freq: Two times a day (BID) | ORAL | Status: DC

## 2014-05-06 MED ORDER — ASPIRIN 81 MG PO CHEW: 81.0000 mg | CHEWABLE_TABLET | Freq: Every day | ORAL | Status: DC

## 2014-05-06 MED ORDER — LOSARTAN POTASSIUM 25 MG PO TABS: 25.0000 mg | ORAL_TABLET | Freq: Every day | ORAL | Status: DC

## 2014-05-06 MED ORDER — TICAGRELOR 90 MG PO TABS: 90.0000 mg | ORAL_TABLET | Freq: Two times a day (BID) | ORAL | Status: DC

## 2014-05-06 MED ORDER — PANTOPRAZOLE SODIUM 40 MG PO TBEC: 40.0000 mg | DELAYED_RELEASE_TABLET | Freq: Every day | ORAL | Status: DC

## 2014-05-06 MED ORDER — NITROGLYCERIN 0.4 MG SL SUBL: 0.4000 mg | SUBLINGUAL_TABLET | SUBLINGUAL | Status: DC | PRN

## 2014-05-06 MED ORDER — ATORVASTATIN CALCIUM 80 MG PO TABS: 80.0000 mg | ORAL_TABLET | Freq: Every day | ORAL | Status: DC

## 2014-05-06 MED ORDER — POTASSIUM CHLORIDE CRYS ER 20 MEQ PO TBCR
20.0000 meq | EXTENDED_RELEASE_TABLET | Freq: Every day | ORAL | Status: DC
Start: 2014-05-06 — End: 2015-05-14

## 2014-05-06 MED ORDER — POTASSIUM CHLORIDE CRYS ER 20 MEQ PO TBCR: 20.0000 meq | EXTENDED_RELEASE_TABLET | Freq: Every day | ORAL | Status: DC

## 2014-05-06 MED ORDER — ATORVASTATIN CALCIUM 80 MG PO TABS
80.0000 mg | ORAL_TABLET | Freq: Every day | ORAL | Status: DC
Start: 2014-05-06 — End: 2014-05-06

## 2014-05-06 MED FILL — Sodium Chloride IV Soln 0.9%: INTRAVENOUS | Qty: 50 | Status: CN

## 2014-05-06 NOTE — Discharge Instructions (Signed)
**PLEASE REMEMBER TO BRING ALL OF YOUR MEDICATIONS TO EACH OF YOUR FOLLOW-UP OFFICE VISITS.  NO HEAVY LIFTING X 4 WEEKS. NO SEXUAL ACTIVITY X 4 WEEKS. NO DRIVING X 2 WEEKS. NO SOAKING BATHS, HOT TUBS, POOLS, ETC., X 7 DAYS.  Radial Site Care Refer to this sheet in the next few weeks. These instructions provide you with information on caring for yourself after your procedure. Your caregiver may also give you more specific instructions. Your treatment has been planned according to current medical practices, but problems sometimes occur. Call your caregiver if you have any problems or questions after your procedure. HOME CARE INSTRUCTIONS  You may shower the day after the procedure.Remove the bandage (dressing) and gently wash the site with plain soap and water.Gently pat the site dry.   Do not apply powder or lotion to the site.   Do not submerge the affected site in water for 3 to 5 days.   Inspect the site at least twice daily.   Do not flex or bend the affected arm for 24 hours.   No lifting over 5 pounds (2.3 kg) for 5 days after your procedure.   Do not drive home if you are discharged the same day of the procedure. Have someone else drive you.   What to expect:  Any bruising will usually fade within 1 to 2 weeks.   Blood that collects in the tissue (hematoma) may be painful to the touch. It should usually decrease in size and tenderness within 1 to 2 weeks.  SEEK IMMEDIATE MEDICAL CARE IF:  You have unusual pain at the radial site.   You have redness, warmth, swelling, or pain at the radial site.   You have drainage (other than a small amount of blood on the dressing).   You have chills.   You have a fever or persistent symptoms for more than 72 hours.   You have a fever and your symptoms suddenly get worse.   Your arm becomes pale, cool, tingly, or numb.   You have heavy bleeding from the site. Hold pressure on the site.    Cardiac Diet This diet can help  prevent heart disease and stroke. Many factors influence your heart health, including eating and exercise habits. Coronary risk rises a lot with abnormal blood fat (lipid) levels. Cardiac meal planning includes limiting unhealthy fats, increasing healthy fats, and making other small dietary changes. General guidelines are as follows:  Adjust calorie intake to reach and maintain desirable body weight.  Limit total fat intake to less than 30% of total calories. Saturated fat should be less than 7% of calories.  Saturated fats are found in animal products and in some vegetable products. Saturated vegetable fats are found in coconut oil, cocoa butter, palm oil, and palm kernel oil. Read labels carefully to avoid these products as much as possible. Use butter in moderation. Choose tub margarines and oils that have 2 grams of fat or less. Good cooking oils are canola and olive oils.  Practice low-fat cooking techniques. Do not fry food. Instead, broil, bake, boil, steam, grill, roast on a rack, stir-fry, or microwave it. Other fat reducing suggestions include:  Remove the skin from poultry.  Remove all visible fat from meats.  Skim the fat off stews, soups, and gravies before serving them.  Steam vegetables in water or broth instead of sauting them in fat.  Avoid foods with trans fat (or hydrogenated oils), such as commercially fried foods and commercially baked goods. Chiropractor  shortening and deep-frying fats will contain trans fat.  Increase intake of fruits, vegetables, whole grains, and legumes to replace foods high in fat.  Increase consumption of nuts, legumes, and seeds to at least 4 servings weekly. One serving of a legume equals  cup, and 1 serving of nuts or seeds equals  cup.  Choose whole grains more often. Have 3 servings per day (a serving is 1 ounce [oz]).  Eat 4 to 5 servings of vegetables per day. A serving of vegetables is 1 cup of raw leafy vegetables;  cup of raw or  cooked cut-up vegetables;  cup of vegetable juice.  Eat 4 to 5 servings of fruit per day. A serving of fruit is 1 medium whole fruit;  cup of dried fruit;  cup of fresh, frozen, or canned fruit;  cup of 100% fruit juice.  Increase your intake of dietary fiber to 20 to 30 grams per day. Insoluble fiber may help lower your risk of heart disease and may help curb your appetite.  Soluble fiber binds cholesterol to be removed from the blood. Foods high in soluble fiber are dried beans, citrus fruits, oats, apples, bananas, broccoli, Brussels sprouts, and eggplant.  Try to include foods fortified with plant sterols or stanols, such as yogurt, breads, juices, or margarines. Choose several fortified foods to achieve a daily intake of 2 to 3 grams of plant sterols or stanols.  Foods with omega-3 fats can help reduce your risk of heart disease. Aim to have a 3.5 oz portion of fatty fish twice per week, such as salmon, mackerel, albacore tuna, sardines, lake trout, or herring. If you wish to take a fish oil supplement, choose one that contains 1 gram of both DHA and EPA.  Limit processed meats to 2 servings (3 oz portion) weekly.  Limit the sodium in your diet to 1500 milligrams (mg) per day. If you have high blood pressure, talk to a registered dietitian about a DASH (Dietary Approaches to Stop Hypertension) eating plan.  Limit sweets and beverages with added sugar, such as soda, to no more than 5 servings per week. One serving is:   1 tablespoon sugar.  1 tablespoon jelly or jam.   cup sorbet.  1 cup lemonade.   cup regular soda. CHOOSING FOODS Starches  Allowed: Breads: All kinds (wheat, rye, raisin, white, oatmeal, [place], [place], and English muffin bread). Low-fat rolls: English muffins, frankfurter and hamburger buns, bagels, pita bread, tortillas (not fried). Pancakes, waffles, biscuits, and muffins made with recommended oil.  Avoid: Products made with saturated or trans fats,  oils, or whole milk products. Butter rolls, cheese breads, croissants. Commercial doughnuts, muffins, sweet rolls, biscuits, waffles, pancakes, store-bought mixes. Crackers  Allowed: Low-fat crackers and snacks: Animal, graham, rye, saltine (with recommended oil, no lard), oyster, and matzo crackers. Bread sticks, melba toast, rusks, flatbread, pretzels, and light popcorn.  Avoid: High-fat crackers: cheese crackers, butter crackers, and those made with coconut, palm oil, or trans fat (hydrogenated oils). Buttered popcorn. Cereals  Allowed: Hot or cold whole-grain cereals.  Avoid: Cereals containing coconut, hydrogenated vegetable fat, or animal fat. Potatoes / Pasta / Rice  Allowed: All kinds of potatoes, rice, and pasta (such as macaroni, spaghetti, and noodles).  Avoid: Pasta or rice prepared with cream sauce or high-fat cheese. Chow mein noodles, [place] fries. Vegetables  Allowed: All vegetables and vegetable juices.  Avoid: Fried vegetables. Vegetables in cream, butter, or high-fat cheese sauces. Limit coconut. Fruit in cream or custard. Protein  Allowed:  Limit your intake of meat, seafood, and poultry to no more than 6 oz (cooked weight) per day. All lean, well-trimmed beef, veal, pork, and lamb. All chicken and [place] without skin. All fish and shellfish. Wild game: wild duck, rabbit, pheasant, and venison. Egg whites or low-cholesterol egg substitutes may be used as desired. Meatless dishes: recipes with dried beans, peas, lentils, and tofu (soybean curd). Seeds and nuts: all seeds and most nuts.  Avoid: Prime grade and other heavily marbled and fatty meats, such as short ribs, spare ribs, rib eye roast or steak, frankfurters, sausage, bacon, and high-fat luncheon meats, mutton. Caviar. Commercially fried fish. Domestic duck, goose, venison sausage. Organ meats: liver, gizzard, heart, chitterlings, brains, kidney, sweetbreads. Dairy  Allowed: Low-fat cheeses: nonfat or low-fat  cottage cheese (1% or 2% fat), cheeses made with part skim milk, such as mozzarella, farmers, string, or ricotta. (Cheeses should be labeled no more than 2 to 6 grams fat per oz.). Skim (or 1%) milk: liquid, powdered, or evaporated. Buttermilk made with low-fat milk. Drinks made with skim or low-fat milk or cocoa. Chocolate milk or cocoa made with skim or low-fat (1%) milk. Nonfat or low-fat yogurt.  Avoid: Whole milk cheeses, including colby, cheddar, [place], Monterey Jack, [place], [place], [place], American, Swiss, and blue. Creamed cottage cheese, cream cheese. Whole milk and whole milk products, including buttermilk or yogurt made from whole milk, drinks made from whole milk. Condensed milk, evaporated whole milk, and 2% milk. Soups and Combination Foods  Allowed: Low-fat low-sodium soups: broth, dehydrated soups, homemade broth, soups with the fat removed, homemade cream soups made with skim or low-fat milk. Low-fat spaghetti, lasagna, chili, and Spanish rice if low-fat ingredients and low-fat cooking techniques are used.  Avoid: Cream soups made with whole milk, cream, or high-fat cheese. All other soups. Desserts and Sweets  Allowed: Sherbet, fruit ices, gelatins, meringues, and angel food cake. Homemade desserts with recommended fats, oils, and milk products. Jam, jelly, honey, marmalade, sugars, and syrups. Pure sugar candy, such as gum drops, hard candy, jelly beans, marshmallows, mints, and small amounts of dark chocolate.  Avoid: Commercially prepared cakes, pies, cookies, frosting, pudding, or mixes for these products. Desserts containing whole milk products, chocolate, coconut, lard, palm oil, or palm kernel oil. Ice cream or ice cream drinks. Candy that contains chocolate, coconut, butter, hydrogenated fat, or unknown ingredients. Buttered syrups. Fats and Oils  Allowed: Vegetable oils: safflower, sunflower, corn, soybean, cottonseed, sesame, canola, olive, or peanut. Non-hydrogenated  margarines. Salad dressing or mayonnaise: homemade or commercial, made with a recommended oil. Low or nonfat salad dressing or mayonnaise.  Limit added fats and oils to 6 to 8 tsp per day (includes fats used in cooking, baking, salads, and spreads on bread). Remember to count the "hidden fats" in foods.  Avoid: Solid fats and shortenings: butter, lard, salt pork, bacon drippings. Gravy containing meat fat, shortening, or suet. Cocoa butter, coconut. Coconut oil, palm oil, palm kernel oil, or hydrogenated oils: these ingredients are often used in bakery products, nondairy creamers, whipped toppings, candy, and commercially fried foods. Read labels carefully. Salad dressings made of unknown oils, sour cream, or cheese, such as blue cheese and Roquefort. Cream, all kinds: half-and-half, light, heavy, or whipping. Sour cream or cream cheese (even if "light" or low-fat). Nondairy cream substitutes: coffee creamers and sour cream substitutes made with palm, palm kernel, hydrogenated oils, or coconut oil. Beverages  Allowed: Coffee (regular or decaffeinated), tea. Diet carbonated beverages, mineral water. Alcohol: Check with your  caregiver. Moderation is recommended.  Avoid: Whole milk, regular sodas, and juice drinks with added sugar. Condiments  Allowed: All seasonings and condiments. Cocoa powder. "Cream" sauces made with recommended ingredients.  Avoid: Carob powder made with hydrogenated fats. SAMPLE MENU Breakfast   cup orange juice   cup oatmeal  1 slice toast  1 tsp margarine  1 cup skim milk Lunch  [place] sandwich with 2 oz [place], 2 slices bread  Lettuce and tomato slices  Fresh fruit  Carrot sticks  Coffee or tea Snack  Fresh fruit or low-fat crackers Dinner  3 oz lean ground beef  1 baked potato  1 tsp margarine   cup asparagus  Lettuce salad  1 tbs non-creamy dressing   cup peach slices  1 cup skim milk Document Released: 01/18/2008 Document  Revised: 10/10/2011 Document Reviewed: 06/10/2013 [employer] Patient Information 2015 [place], [place]. This information is not intended to replace advice given to you by your health care provider. Make sure you discuss any questions you have with your health care provider.  Myocardial Infarction A myocardial infarction (MI) is damage to the heart that is not reversible. It is also called a heart attack. An MI usually occurs when a heart (coronary) artery becomes blocked or narrowed. This cuts off the blood supply to the heart. When one or more of the heart (coronary) arteries becomes blocked, that area of the heart begins to die. This causes pain felt during an MI.  If you think you might be having an MI, call your local emergency services immediately (911 in [place] It is recommended that you chew and swallow 3 non-enteric coated baby aspirin if you do not have an aspirin allergy. Do not drive yourself to the hospital or wait to see if your symptoms go away. The sooner MI is treated, the greater the amount of heart muscle saved. Time is muscle. It can save your life. CAUSES  An MI can occur from:  A gradual buildup of a fatty substance called plaque. When plaque builds up in the arteries, this condition is called atherosclerosis. This buildup can block or reduce the blood supply to the heart artery(s).  A sudden plaque rupture within a heart artery that causes a blood clot (thrombus). A blood clot can block the heart artery which does not allow blood flow to the heart.  A severe tightening (spasm) of the heart artery. This is a less common cause of a heart attack. When a heart artery spasms, it cuts off blood flow through the artery. Spasms can occur in heart arteries that do not have atherosclerosis. RISK FACTORS People at risk for an MI usually have one or more risk factors, such as:  High blood pressure.  High cholesterol.  Smoking.  Gender. Men have a higher heart attack  risk.  Overweight/obesity.  Age.  Family history.  Lack of exercise.  Diabetes.  Stress.  Excessive alcohol use.  Street drug use (cocaine and methamphetamines). SYMPTOMS  MI symptoms can vary, such as:  In both men and women, MI symptoms can include the following:  Chest pain. The chest pain may feel like a crushing, squeezing, or "pressure" type feeling. MI pain can be "referred," meaning pain can be caused in one part of the body but felt in another part of the body. Referred MI pain may occur in the left arm, neck, or jaw. Pain may even be felt in the right arm.  Shortness of breath (dyspnea).  Heartburn or indigestion with or without vomiting,  shortness of breath, or sweating (diaphoresis).  Sudden, cold sweats.  Sudden lightheadedness.  Upper back pain.  Women can have unique MI symptoms, such as:  Unexplained feelings of nervousness or anxiety.  Discomfort between the shoulder blades (scapula) or upper back.  Tingling in the hands and arms.  In elderly people (regardless of gender), MI symptoms can be subtle, such as:  Sweating (diaphoresis).  Shortness of breath (dyspnea).  General tiredness (fatigue) or not feeling well (malaise). DIAGNOSIS  Diagnosis of an MI involves several tests such as:  An assessment of your vital signs such as heart rhythm, blood pressure, respiratory rate, and oxygen level.  An EKG (ECG) to look at the electrical activity of your heart.  Blood tests called cardiac markers are drawn at scheduled times to measure proteins or enzymes released by the damaged heart muscle.  A chest X-ray.  An echocardiogram to evaluate heart motion and blood flow.  Coronary angiography (cardiac catheterization). This is a diagnostic procedure to look at the heart arteries. TREATMENT  Acute Intervention. For an MI, the national standard in the [place] is to have an acute intervention in under 90 minutes from the time you get to the  hospital. An acute intervention is a special procedure to open up the heart arteries. It is done in a treatment room called a "catheterization lab" (cath lab). Some hospitals do no have a cath lab. If you are having an MI and the hospital does not have a cath lab, the standard is to transport you to a hospital that has one. In the cath lab, acute intervention includes:  Angioplasty. An angioplasty involves inserting a thin, flexible tube (catheter) into an artery in either your groin or wrist. The catheter is threaded to the heart arteries. A balloon at the end of the catheter is inflated to open a narrowed or blocked heart artery. During an angioplasty procedure, a small mesh tube (stent) may be used to keep the heart artery open. Depending on your condition and health history, one of two types of stents may be placed:  Drug-eluting stent (DES). A DES is coated with a medicine to prevent scar tissue from growing over the stent. With drug-eluting stents, blood thinning medicine will need to be taken for up to a year.  Bare metal stent. This type of stent has no special coating to keep tissue from growing over it. This type of stent is used if you cannot take blood thinning medicine for a prolonged time or you need surgery in the near future. After a bare metal stent is placed, blood thinning medicine will need to be taken for about a month.  If you are taking blood thinning medicine (anti-platelet therapy) after stent placement, do not stop taking it unless your caregiver says it is okay to do so. Make sure you understand how long you need to take the medicine. Surgical Intervention  If an acute intervention is not successful, surgery may be needed:  Open heart surgery (coronary artery bypass graft, CABG). CABG takes a vein (saphenous vein) from your leg. The vein is then attached to the blocked heart artery which bypasses the blockage. This then allows blood flow to the heart muscle. Additional  Interventions  A "clot buster" medicine (thrombolytic) may be given. This medicine can help break up a clot in the heart artery. This medicine may be given if a person cannot get to a cath lab right away.  Intra-aortic balloon pump (IABP). If you have suffered a  very severe MI and are too unstable to go to the cath lab or to surgery, an IABP may be used. This is a temporary mechanical device used to increase blood flow to the heart and reduce the workload of the heart until you are stable enough to go to the cath lab or surgery. HOME CARE INSTRUCTIONS After an MI, you may need the following:  Medicine. Take medicine as directed by your caregiver. Medicines after an MI may:  Keep your blood from clotting easily (blood thinners).  Control your blood pressure.  Help lower your cholesterol.  Control abnormal heart rhythms.  Lifestyle changes. Under the guidance of your caregiver, lifestyle changes include:  Quitting smoking, if you smoke. Your caregiver can help you quit.  Being physically active.  Maintaining a healthy weight.  Eating a heart healthy diet. A dietitian can help you learn healthy eating options.  Managing diabetes.  Reducing stress.  Limiting alcohol intake. SEEK IMMEDIATE MEDICAL CARE IF:   You have severe chest pain, especially if the pain is crushing or pressure-like and spreads to the arms, back, neck, or jaw. This is an emergency. Do not wait to see if the pain will go away. Get medical help at once. Call your local emergency services (911 in the [place] Do not drive yourself to the hospital.  You have shortness of breath during rest, sleep, or with activity.  You have sudden sweating or clammy skin.  You feel sick to your stomach (nauseous) and throw up (vomit).  You suddenly become lightheaded or dizzy.  You feel your heart beating rapidly or you notice "skipped" beats. MAKE SURE YOU:   Understand these instructions.  Will watch your  condition.  Will get help right away if you are not doing well or get worse. Document Released: 04/10/2005 Document Revised: 04/15/2013 Document Reviewed: 06/13/2013 [facility] Patient Information 2015 [place], [place]. This information is not intended to replace advice given to you by your health care provider. Make sure you discuss any questions you have with your health care provider.

## 2014-05-06 NOTE — Progress Notes (Signed)
Patient Name: Timothy Yoder Date of Encounter: 05/06/2014   Principal Problem:   Acute MI, anterolateral wall, initial episode of care Active Problems:   CAD (coronary artery disease)   Essential hypertension   Hyperlipidemia   Hypokalemia   Insomnia    SUBJECTIVE  No chest pain or sob.  R wrist stable w/o pain/bleeding.  Ambulated some last night w/o difficulty.  CURRENT MEDS . aspirin  81 mg Oral Daily  . atorvastatin  80 mg Oral q1800  . heparin  5,000 Units Subcutaneous 3 times per day  . losartan  25 mg Oral Daily  . metoprolol tartrate  25 mg Oral BID  . pantoprazole  80 mg Oral Daily  . potassium chloride  20 mEq Oral BID  . tamsulosin  0.4 mg Oral QPC supper  . ticagrelor  90 mg Oral BID    OBJECTIVE  Filed Vitals:   05/05/14 2204 05/05/14 2235 05/05/14 2341 05/06/14 0400  BP: 134/81 120/70 138/88 132/75  Pulse: 79 75 79 83  Temp:   98.5 F (36.9 C) 98.2 F (36.8 C)  TempSrc:   Oral Oral  Resp:   18 18  Height:      Weight:    155 lb 4.8 oz (70.444 kg)  SpO2: 97% 95% 97% 98%    Intake/Output Summary (Last 24 hours) at 05/06/14 0719 Last data filed at 05/05/14 2143  Gross per 24 hour  Intake 1426.8 ml  Output    175 ml  Net 1251.8 ml   Filed Weights   05/03/14 1745 05/06/14 0400  Weight: 161 lb 13.1 oz (73.4 kg) 155 lb 4.8 oz (70.444 kg)    PHYSICAL EXAM  General: Pleasant, NAD. Neuro: Alert and oriented X 3. Moves all extremities spontaneously. Psych: Normal affect. HEENT:  Normal  Neck: Supple without bruits or JVD. Lungs:  Resp regular and unlabored, CTA. Heart: RRR no s3, s4, or murmurs. Abdomen: Soft, non-tender, non-distended, BS + x 4.  Extremities: No clubbing, cyanosis or edema. DP/PT/Radials 2+ and equal bilaterally.  R wrist w/o bleeding/bruit/hematoma.  Accessory Clinical Findings  CBC  Recent Labs  05/03/14 1928 05/04/14 0033 05/05/14 1830  WBC 9.0 7.7 7.1  NEUTROABS 7.2  --   --   HGB 13.3 13.7 13.4  HCT 39.1  38.9* 38.9*  MCV 92.2 94.4 93.3  PLT 218 217 580   Basic Metabolic Panel  Recent Labs  05/04/14 0033 05/05/14 0241  NA 138 140  K 3.4* 3.7  CL 108 107  CO2 26 28  GLUCOSE 95 91  BUN 9 9  CREATININE 0.99 1.07  CALCIUM 8.6 8.8   Liver Function Tests  Recent Labs  05/03/14 1928  AST 25  ALT 18  ALKPHOS 106  BILITOT 1.0  PROT 6.0  ALBUMIN 3.5   Cardiac Enzymes  Recent Labs  05/03/14 1928 05/04/14 0033 05/04/14 0735  TROPONINI 0.92* 3.23* 3.90*   Fasting Lipid Panel  Recent Labs  05/04/14 0033  CHOL 167  HDL 27*  LDLCALC 123*  TRIG 87  CHOLHDL 6.2   Thyroid Function Tests  Recent Labs  05/03/14 1930  TSH 1.007   TELE  RSR  ECG  1/12 - RSR, 76, poor R progression, deep anterolateral TWI.  Radiology/Studies  Dg Chest Port 1v Same Day  05/04/2014   CLINICAL DATA:  Myocardial infarction. Congestive heart failure yesterday. Feeling better today.  EXAM: PORTABLE CHEST - 1 VIEW SAME DAY  COMPARISON:  None.  FINDINGS: The cardiac  silhouette, mediastinal and hilar contours are within normal limits. The lungs are clear. No pulmonary edema or pleural effusions. The bony thorax is intact.  IMPRESSION: No acute cardiopulmonary findings.   Electronically Signed   By: Kalman Jewels M.D.   On: 05/04/2014 12:50    ASSESSMENT AND PLAN  1.  Acute anterolateral STEMI, initial episode of care/CAD:  S/P successful PCI/DES of the LAD on 1/10 with f/u FFR w/in the RCA yesterday to assess residual dzs.  FFR was nl @ 0.90 Med Rx recommended.  No chest pain overnight.  Ambulating without difficulty.  Plan d/c today.  Cont asa, brilinta, bb, high potency statin.  He is considering cardiac rehab @ [facility].  He'd like to f/u with Dr. Burt Knack here in [place].  2.  HTN:  Stable on bb/arb.  3.  HL:  LDL 123 (statin naive).  Cont high potency lipitor.  LFT's wnl.  F/U lipids/lft's in 6-8 wks.  4.  Hypokalemia: stable - 3.7.  5.  Insomnia:  Slept well last night.  Ambien added  while in hospital.  Signed, Murray Hodgkins NP  I have personally seen and examined patient and agree with note as outlined by Danna Hefty NP.  Patient s/p acute anterolateral MI with PCI of LAD with DES.  Repeat cath yesterday with flow wire showed normal FFR so medical management.  Will continue on DAPT for at least a year, statin and BB.  He is already on an ARB for HTN.  He was started on high dose statin for elevated LDL.  His potassium has been low throughout the admission and he is not on diuretics.  Will supplement with low dose Kdur and have him followup with PCP.  Stable for discharge home today.  Signed: Fransico Him, MD [facility] 05/06/2014

## 2014-05-06 NOTE — Telephone Encounter (Signed)
New message     TCM appt on 05-13-14 with Dayna per Gerald Stabs

## 2014-05-06 NOTE — Progress Notes (Signed)
CARDIAC REHAB PHASE I   PRE:  Rate/Rhythm: 93 SR  BP:  Supine: 144/83  Sitting:   Standing:    SaO2:   MODE:  Ambulation: 450 ft   POST:  Rate/Rhythm: 100 SR  BP:  Supine:   Sitting: 146/86  Standing:    SaO2:  0945-1025 Pt walked 450 ft with steady gait without CP. Would have walked farther but had fire drill. Completed MI ed with pt  And wife. Reviewed NTG use, importance of brilinta with stent, risk factors and ex ed. Understanding voiced. Discussed CRP 2 and permission given to refer to [facility].   Graylon Good, RN BSN  05/06/2014 10:20 AM

## 2014-05-06 NOTE — Discharge Summary (Signed)
Discharge Summary   Patient ID: Timothy Yoder,  MRN: [id number], DOB/AGE: 12/15/1955 59 y.o.  Admit date: 05/03/2014 Discharge date: 05/06/2014  Primary Care Provider: No primary care provider on file. Primary Cardiologist: M. Burt Knack, MD   Discharge Diagnoses Principal Problem:   Acute MI, anterolateral wall, initial episode of care  **Status post successful PCI/DES to the mid LAD this admission.  **70% lesion in the RCA evaluated with FFR and felt to be non-obstructive (FFR = 0.90).  Active Problems:   CAD (coronary artery disease)   Essential hypertension   Hyperlipidemia   Hypokalemia requiring supplementation   Insomnia  Allergies No Known Allergies  Procedures  Cardiac Catheterization and Percutaneous Coronary Intervention 1.10.2016  PROCEDURAL FINDINGS Hemodynamics: AO 112/74 LV 110/11              Coronary angiography: Coronary dominance: right  Left mainstem: Left main is widely patent without stenosis. Divides into the LAD and left circumflex.  Left anterior descending (LAD): The LAD is diffusely diseased. It reaches the LV apex. The first diagonal has a 50% stenosis in its proximal body. The mid LAD has a 85% stenosis. I suspected this was the patient's culprit lesion based on location of injury current on EKG.   **The mid LAD was successfully stented using a 2.75 x 16 mm Promus drug-eluting stent.**  Left circumflex (LCx): The circumflex is patent. The ramus intermedius branches are fairly large and it divides into twin vessels. There is no significant stenosis. The first obtuse marginal is totally occluded. The vessel is very small and they occlusion has the appearance of a chronic occlusion.  Right coronary artery (RCA): Large, dominant vessel. The vessel has diffuse plaquing. The mid vessel has 20-30% stenosis. At the junction of the mid and distal vessel there is a 70% hypodense lesion. The PDA is a large branching and has an eccentric 50% stenosis in its  mid body. The PLA is a smaller branch and it has a 70% stenosis at the junction of the posterior AV segment and the second PLA branch.   **Fractional Flow Reserve was performed within the RCA on 1.12.2016 and was normal @ 0.90.  Medical therapy recommended.**  Left ventriculography: There is mild hypokinesis of the LV apex. The other LV wall segments contract vigorously. The estimated LVEF is 60%. _____________   History of Present Illness  59 y/o male without prior cardiac history.  He was in her usual state of health until 05/03/2014, when he developed substernal chest discomfort while working in his yard.  He presented to [facility] in [place] and was found to have anterolateral ST elevation.  A code STEMI was activated and the patient was emergently transported to the [facility] catheterization laboratory.  Hospital Course  Following arrival at [facility], Timothy Yoder underwent emergent diagnostic cardiac catheterization revealing severe mid LAD disease with an 85% stenosis, which was felt to be the culprit for his presentation.  He was also noted to have an occluded OM1, though this was felt to be chronic.  A 70% stenosis was noted in the mid and distal RCA.  The LAD was successfully stented using a 2.75 x 16 mm Promus DES.  Patient tolerated procedure well and was subsequently monitored in the coronary intensive care unit where he remained stable and peaked his troponin at 3.90.  He was placed on asa, brilinta, beta blocker, arb, and high potency statin therapy.  He had no further chest pain.    With residual RCA disease  of unclear ischemic significance, decision was made to take him back to the cath lab on 05/05/2014 for fractional flow reserve measurement.  This revealed a normal FFR of 0.90, and thus medical therapy was recommended.    Timothy Yoder has been seen by cardiac rehab and has been ambulating without difficulty.  He is considering partaking in outpatient cardiac rehab at [facility].  He will be  discharged home today in good condition.  We have arranged for f/u in 7 days in [place] at the patient's request.  Of note, he has been hypokalemic throughout this admission.  He is currently on potassium supplementation and will require a repeat BMET upon f/u on 05/13/2014.  Discharge Vitals Blood pressure 148/93, pulse 80, temperature 97.8 F (36.6 C), temperature source Oral, resp. rate 18, height 5\' 6"  (1.676 m), weight 155 lb 4.8 oz (70.444 kg), SpO2 96 %.  Filed Weights   05/03/14 1745 05/06/14 0400  Weight: 161 lb 13.1 oz (73.4 kg) 155 lb 4.8 oz (70.444 kg)    Labs  CBC  Recent Labs  05/03/14 1928 05/04/14 0033 05/05/14 1830  WBC 9.0 7.7 7.1  NEUTROABS 7.2  --   --   HGB 13.3 13.7 13.4  HCT 39.1 38.9* 38.9*  MCV 92.2 94.4 93.3  PLT 218 217 786   Basic Metabolic Panel  Recent Labs  05/04/14 0033 05/05/14 0241  NA 138 140  K 3.4* 3.7  CL 108 107  CO2 26 28  GLUCOSE 95 91  BUN 9 9  CREATININE 0.99 1.07  CALCIUM 8.6 8.8   Liver Function Tests  Recent Labs  05/03/14 1928  AST 25  ALT 18  ALKPHOS 106  BILITOT 1.0  PROT 6.0  ALBUMIN 3.5   Cardiac Enzymes  Recent Labs  05/03/14 1928 05/04/14 0033 05/04/14 0735  TROPONINI 0.92* 3.23* 3.90*   Fasting Lipid Panel  Recent Labs  05/04/14 0033  CHOL 167  HDL 27*  LDLCALC 123*  TRIG 87  CHOLHDL 6.2   Thyroid Function Tests  Recent Labs  05/03/14 1930  TSH 1.007   Disposition  Pt is being discharged home today in good condition.  Follow-up Plans & Appointments      Follow-up Information    Follow up with Melina Copa, PA-C On 05/13/2014.   Specialty:  Cardiology   Why:  9:00 AM - Dr. Antionette Char PA.   Contact information:   [address] [place] [address] [phone]      Discharge Medications    Medication List    STOP taking these medications        ibuprofen 200 MG tablet  Commonly known as:  ADVIL,MOTRIN      TAKE these medications         aspirin 81 MG chewable tablet  Chew 1 tablet (81 mg total) by mouth daily.     atorvastatin 80 MG tablet  Commonly known as:  LIPITOR  Take 1 tablet (80 mg total) by mouth daily at 6 PM.     losartan 25 MG tablet  Commonly known as:  COZAAR  Take 1 tablet (25 mg total) by mouth daily.     metoprolol tartrate 25 MG tablet  Commonly known as:  LOPRESSOR  Take 1 tablet (25 mg total) by mouth 2 (two) times daily.     nitroGLYCERIN 0.4 MG SL tablet  Commonly known as:  NITROSTAT  Place 1 tablet (0.4 mg total) under the tongue every 5 (five) minutes x  3 doses as needed for chest pain.     omeprazole 40 MG capsule  Commonly known as:  PRILOSEC  Take 40 mg by mouth daily.     potassium chloride SA 20 MEQ tablet  Commonly known as:  K-DUR,KLOR-CON  Take 1 tablet (20 mEq total) by mouth daily.     tamsulosin 0.4 MG Caps capsule  Commonly known as:  FLOMAX  Take 0.4 mg by mouth daily after supper.     ticagrelor 90 MG Tabs tablet  Commonly known as:  BRILINTA  Take 1 tablet (90 mg total) by mouth 2 (two) times daily.  Notes to Patient:  TAKE ONE DOSE TONIGHT       Outstanding Labs/Studies  Follow-up lipids/lft's in 6-8 wks. Follow-up potassium on 05/13/2014.  Duration of Discharge Encounter   Greater than 30 minutes including physician time.  Signed, Murray Hodgkins NP 05/06/2014, 11:40 AM

## 2014-05-07 ENCOUNTER — Encounter: Payer: Self-pay | Admitting: Physician Assistant

## 2014-05-07 NOTE — Telephone Encounter (Signed)
Patient contacted regarding discharge from [facility] on 05/06/14.  Patient understands to follow up with provider Melina Copa PA on 05/13/2014 at 09:00 at [address]. Patient understands discharge instructions? yes Patient understands medications and regiment? yes Patient understands to bring all medications to this visit? yes  Patient complained of diarrhea, which started yesterday and continues today. Advised patient that if it continues to follow up with his PCP and to try to keep hydrated. Looking at patient's medications, I did not see anything that would cause diarrhea. Patient had no other concerns or questions at this time. Patient verbalized understanding of follow up appointment with Melina Copa PA on 05/13/2014 at 9:00.

## 2014-05-08 ENCOUNTER — Other Ambulatory Visit: Payer: Self-pay | Admitting: Pharmacist

## 2014-05-13 ENCOUNTER — Ambulatory Visit (INDEPENDENT_AMBULATORY_CARE_PROVIDER_SITE_OTHER): Payer: BC Managed Care – PPO | Admitting: Pharmacist

## 2014-05-13 ENCOUNTER — Encounter: Payer: Self-pay | Admitting: Physician Assistant

## 2014-05-13 ENCOUNTER — Ambulatory Visit (INDEPENDENT_AMBULATORY_CARE_PROVIDER_SITE_OTHER): Payer: BC Managed Care – PPO | Admitting: Physician Assistant

## 2014-05-13 VITALS — BP 110/74 | HR 71 | Ht 69.0 in | Wt 157.4 lb

## 2014-05-13 DIAGNOSIS — E785 Hyperlipidemia, unspecified: Secondary | ICD-10-CM

## 2014-05-13 DIAGNOSIS — E876 Hypokalemia: Secondary | ICD-10-CM

## 2014-05-13 DIAGNOSIS — I10 Essential (primary) hypertension: Secondary | ICD-10-CM

## 2014-05-13 DIAGNOSIS — I2109 ST elevation (STEMI) myocardial infarction involving other coronary artery of anterior wall: Secondary | ICD-10-CM

## 2014-05-13 DIAGNOSIS — I2510 Atherosclerotic heart disease of native coronary artery without angina pectoris: Secondary | ICD-10-CM

## 2014-05-13 DIAGNOSIS — Z87891 Personal history of nicotine dependence: Secondary | ICD-10-CM | POA: Insufficient documentation

## 2014-05-13 DIAGNOSIS — Z9861 Coronary angioplasty status: Secondary | ICD-10-CM

## 2014-05-13 LAB — BASIC METABOLIC PANEL
BUN: 10 mg/dL (ref 6–23)
CO2: 30 mEq/L (ref 19–32)
CREATININE: 1.04 mg/dL (ref 0.40–1.50)
Calcium: 9.6 mg/dL (ref 8.4–10.5)
Chloride: 104 mEq/L (ref 96–112)
GFR: 77.41 mL/min (ref >60.00)
Glucose, Bld: 96 mg/dL (ref 70–99)
Potassium: 4.3 mEq/L (ref 3.5–5.1)
Sodium: 137 mEq/L (ref 135–145)

## 2014-05-13 NOTE — Patient Instructions (Addendum)
Your physician recommends that you continue on your current medications as directed. Please refer to the Current Medication list given to you today.    FOLLOW UP WITH DR COOPER IN 6 TO 8 WEEKS  LABS ON SAME DAY   LAB WORK TO BE DONE IN 6 TO 8 WEEKS  LIPIDS AND CMET    LAB WORK TO BE DONE TODAY BMET    PAPER WORK IS BEING FAXED TO OFFICE AND [facility] WILL CONTACT YOU WITH FURTHER INFORMATION  [phone]

## 2014-05-13 NOTE — Progress Notes (Signed)
Pharmacy Transitions of Care Post-ACS Visit  Admit Complaint: Timothy Yoder is a very pleasant 26 yom who was discharged on 05/06/14 after NSTEMI. PMH is significant for CAD, s/p PCI and DES, HTN, HLD, hypokalemia, and insomnia. Patient presents to clinic today with his wife for pharmacy medication reconciliation.  Medications and allergies were reviewed with patient and his wife. Medications were not brought to today's appointment, but patient does have a medication list with him today. He has good recall of his medications and states that he uses a pill box to keep his medications organized.  Current Outpatient Prescriptions  Medication Sig Dispense Refill  . aspirin 81 MG chewable tablet Chew 1 tablet (81 mg total) by mouth daily.    Marland Kitchen atorvastatin (LIPITOR) 80 MG tablet Take 1 tablet (80 mg total) by mouth daily at 6 PM. 30 tablet 3  . losartan (COZAAR) 25 MG tablet Take 1 tablet (25 mg total) by mouth daily. 30 tablet 3  . metoprolol tartrate (LOPRESSOR) 25 MG tablet Take 1 tablet (25 mg total) by mouth 2 (two) times daily. 60 tablet 3  . nitroGLYCERIN (NITROSTAT) 0.4 MG SL tablet Place 1 tablet (0.4 mg total) under the tongue every 5 (five) minutes x 3 doses as needed for chest pain. 25 tablet 3  . omeprazole (PRILOSEC) 40 MG capsule Take 40 mg by mouth daily.    . potassium chloride SA (K-DUR,KLOR-CON) 20 MEQ tablet Take 1 tablet (20 mEq total) by mouth daily. 30 tablet 3  . tamsulosin (FLOMAX) 0.4 MG CAPS capsule Take 0.4 mg by mouth daily after supper.    . ticagrelor (BRILINTA) 90 MG TABS tablet Take 1 tablet (90 mg total) by mouth 2 (two) times daily. 180 tablet 3   No current facility-administered medications for this visit.    ACS Medication Checklist:     Medication      YES      NO N/A or C/I and Explanation  Beta Blocker [x]  []    ACEi or ARB [x]  []     High-dose statin    (atorvastatin 40 or 80mg , rosuvastatin 20 or 40mg ) [x]  []    Clopidogrel +/- other         anti-platelets [x]          []     Aspirin [x]  []    Nitroglycerin [x]  []     Smoking cessation counseling provided [x]  []  Pt quit smoking 3 years ago and is doing great.  Cardiac rehab referral [x]         []  Pt wants to switch his cardiac rehab over to [facility] since he works in [place].    Assessment/Plan: refer to chart below.    Interventions during today's visit include: Intervention     YES      NO  Explanation   Indications      Needs Therapy []  [x]     Unnecessary Therapy []  [x]    Efficacy     Suboptimal Drug Selection []  [x]    Insufficient Dose/Duration []  [x]     Safety      Adverse Drug Event []  [x]     Drug Interaction []  [x]     Excessive Dose/Duration []  [x]    Compliance     Underuse []  [x]     Overuse []  [x]     Pt states that he was only on 1 or 2 medications prior to his heart attack. We reviewed all of his new medications thoroughly today and he was provided with a medication sheet explaining his cardiac medications. He  has been feeling well since he returned home last week. We discussed using Tylenol for pain at home rather than NSAIDs d/t increased risk of bleeding with NSAIDs. Pt understands and is in agreement. He also wonders if we can monitor his labs to check his potassium. He was started on K supplements in the hospital but complains that they are big pills. With the recent addition of losartan, his K will increase and he may not need to continue supplementation. Will f/u with lab work.   Megan E. Supple, Pharm.D Clinical Pharmacy Resident Pager: [phone] 05/13/2014 9:03 AM

## 2014-05-13 NOTE — Progress Notes (Addendum)
Cardiology Office Note  Date:  05/13/2014   Patient ID:  Timothy Yoder, Timothy Yoder Aug 18, 1955, MRN [id number]  PCP:  No primary care provider on file.  Cardiologist: Burt Knack   Chief Complaint: here for post-hospital follow-up of heart attack, doing well  History of Present Illness: Timothy Yoder is a 59 y.o. male with history of recent anterolateral STEMI/CAD (s/p DES to mLAD, moderate RCA felt to be nonobstructive by FFR), HTn, HLD, hypokalemia who presents for post-hospital followup. Prior to recent admission he had no prior cardiac history. He was admitted 1/10-1/13 after presenting with chest pain at which time cath showed severe mid LAD disease with an 85% stenosis, which was felt to be the culprit for his presentation.  He was also noted to have an occluded OM1, though this was felt to be chronic.  A 70% stenosis was noted in the mid and distal RCA. The LAD was successfully stented using a 2.75 x 16 mm Promus DES. LVEF 60% with mild HK of LV. Peak troponin 3.9. He was placed on ASA Brilinta, BB, ARB, and high potency statin therapy. LDL 123. With residual RCA disease, decision was made to take him back to the cath lab on 05/05/2014 for FFR which was 0.90, thus medical therapy was recommended.    He is doing well post-MI. He is a little fatigued but says it's getting better. Denies CP, SOB, LEE, orthopnea, syncope. No difficulty obtaining meds. He did notice how big the KCl tabs are and cuts them in half so they are easier to swallow. No dysphagia.  Past Medical History  Diagnosis Date  . CAD (coronary artery disease)     a. 04/2014 anterolateral STEMI/PCI: LM nl, LAD 14m (2.75x16 Promus DES, D1 50p, LCX nl, RI nl, OM1 100 small/chronic, RCA dominant, 20-83m, 35m/d (FFR 0.90->Med Rx), RPDA 50, RPLA 70, EF 60%.  . Gastroesophageal reflux disease   . BPH (benign prostatic hyperplasia)   . HTN (hypertension)   . Hyperlipidemia   . Hypokalemia   . Former tobacco use     Past Surgical History    Procedure Laterality Date  . Left heart catheterization with coronary angiogram N/A 05/03/2014    Procedure: LEFT HEART CATHETERIZATION WITH CORONARY ANGIOGRAM;  Surgeon: Blane Ohara, MD;  Location: [facility] CATH LAB;  Service: Cardiovascular;  Laterality: N/A;  . Percutaneous coronary stent intervention (pci-s) N/A 05/05/2014    Procedure: PERCUTANEOUS CORONARY STENT INTERVENTION (PCI-S);  Surgeon: Peter M Martinique, MD;  Location: [facility] CATH LAB;  Service: Cardiovascular;  Laterality: N/A;    Current Outpatient Prescriptions  Medication Sig Dispense Refill  . aspirin 81 MG chewable tablet Chew 1 tablet (81 mg total) by mouth daily.    Marland Kitchen atorvastatin (LIPITOR) 80 MG tablet Take 1 tablet (80 mg total) by mouth daily at 6 PM. 30 tablet 3  . losartan (COZAAR) 25 MG tablet Take 1 tablet (25 mg total) by mouth daily. 30 tablet 3  . metoprolol tartrate (LOPRESSOR) 25 MG tablet Take 1 tablet (25 mg total) by mouth 2 (two) times daily. 60 tablet 3  . nitroGLYCERIN (NITROSTAT) 0.4 MG SL tablet Place 1 tablet (0.4 mg total) under the tongue every 5 (five) minutes x 3 doses as needed for chest pain. 25 tablet 3  . omeprazole (PRILOSEC) 40 MG capsule Take 40 mg by mouth daily.    . potassium chloride SA (K-DUR,KLOR-CON) 20 MEQ tablet Take 1 tablet (20 mEq total) by mouth daily. 30 tablet 3  . tamsulosin (  FLOMAX) 0.4 MG CAPS capsule Take 0.4 mg by mouth daily after supper.    . ticagrelor (BRILINTA) 90 MG TABS tablet Take 1 tablet (90 mg total) by mouth 2 (two) times daily. 180 tablet 3   No current facility-administered medications for this visit.    Allergies:   Review of patient's allergies indicates no known allergies.   Social History:  The patient  reports that he has quit smoking. He has never used smokeless tobacco. He reports that he does not drink alcohol or use illicit drugs.   Family History:  The patient's family history includes CAD (age of onset: 38) in his father; CAD (age of onset: 55) in his  mother.  ROS:  Please see the history of present illness. Otherwise, review of systems is positive for mild fatigue. All other systems are reviewed and otherwise negative.   PHYSICAL EXAM:  VS:  BP 110/74 mmHg  Pulse 71  Ht 5\' 9"  (1.753 m)  Wt 157 lb 6.4 oz (71.396 kg)  BMI 23.23 kg/m2 BMI: Body mass index is 23.23 kg/(m^2). Well nourished, well developed, in no acute distress HEENT: normocephalic, atraumatic Neck: no JVD Cardiac:  normal S1, S2; RRR; no murmur Lungs:  clear to auscultation bilaterally, no wheezing, rhonchi or rales Abd: soft, nontender, no hepatomegaly Ext: no edema, right radial cath site without significant ecchymosis, hematoma - Good radial pulse. Skin: warm and dry Neuro:  moves all extremities spontaneously, no focal abnormalities noted  EKG:  NSR 71bpm low voltage QRS with residual ST-T changes anterolaterally - slightly improved from last tracing. QTc 481ms. (This was ordered due to recent STEMI)  Recent Labs: 05/03/2014: ALT 18; B Natriuretic Peptide 20.1; TSH 1.007 05/05/2014: BUN 9; Creatinine 1.07; Hemoglobin 13.4; Platelets 220; Potassium 3.7; Sodium 140  05/04/2014: Cholesterol, Total 167; HDL Cholesterol by NMR 27*; LDL (calc) 123*; Total CHOL/HDL Ratio 6.2; Triglycerides 87; VLDL 17   Estimated Creatinine Clearance: 73.4 mL/min (by C-G formula based on Cr of 1.07).   Wt Readings from Last 3 Encounters:  05/13/14 157 lb 6.4 oz (71.396 kg)  05/06/14 155 lb 4.8 oz (70.444 kg)     Other studies reviewed: Additional studies/records reviewed today include: cardiac cath results (both procedures) - summarized above. All hospital labs reviewed to include CMET and TSH as awell.  ASSESSMENT AND PLAN:  1. CAD/recent anterolateral STEMI s/p PCI as above - doing well post-PCI. Continue aspirin, statin, BB, Brilinta. He would prefer doing cardiac rehab at [facility] since it is closer to his job - will have our office try to set this up. He works a fairly physical job  and we agreed that he should get started with cardiac rehab and if he does well within that first week we can release him to go back to work as tolerated. His wife says she will contact us with what paperwork she determines he needs. He will probably be about 3 weeks out from STEMI at that time. He lives in [place] but prefers office f/u here in [place].  2. Essential HTN - controlled. Continue current regimen.  3. Hyperlipidemia - continue statin. F/u CMET/lipids in 6-8 weeks. 4. Recent hypokalemia - recheck BMET today. He is tolerating KCl but doesn't like the size of the pills. If he still requires supplementation will offer him the smaller 46meq tabs.  Disposition: F/u with Dr. Burt Knack in 6-8 weeks.  Current medicines are reviewed at length with the patient today. See above regarding concerns.  Signed, Melina Copa PA-C 05/13/2014  10:00 AM     [facility] [address] [place] [address] [phone] (office)  [phone] (fax)

## 2014-05-13 NOTE — Progress Notes (Signed)
Quick Note:  Please call patient and let him know BMET is normal. His K is now in a good range and I do think he should continue to take his potassium as prescribed. If he wants to try to split the 102meq dose into two 29meq tablets, I'm fine with that. (He is not a fan of the size of the pills.) Alternatively there is also an elixir and a solution of KCl but I think these might be more pricey (and I hear from some patients are not particularly palatable). F/u as planned. Also let him know we faxed in the cardiac rehab request back to [facility] so he should hear back from them soon. Jasminemarie Sherrard PA-C  ______

## 2014-05-14 ENCOUNTER — Telehealth: Payer: Self-pay | Admitting: *Deleted

## 2014-05-14 NOTE — Telephone Encounter (Signed)
pt notified about lab results and that he can cut K+ in half and take 10 meq in AM and 10 meq in PM. Pt aware cardiac rehab form also faxed over as well. Pt verbalized understanding to Plan of Care

## 2014-05-16 ENCOUNTER — Encounter (HOSPITAL_COMMUNITY): Payer: Self-pay | Admitting: Cardiovascular Disease

## 2014-05-18 ENCOUNTER — Encounter (HOSPITAL_COMMUNITY): Payer: Self-pay | Admitting: Cardiovascular Disease

## 2014-05-22 ENCOUNTER — Telehealth: Payer: Self-pay | Admitting: Cardiovascular Disease

## 2014-05-22 ENCOUNTER — Encounter: Payer: Self-pay | Admitting: *Deleted

## 2014-05-22 NOTE — Telephone Encounter (Signed)
New message      Calling to verify that we got a disability form that was faxed on 05-20-14

## 2014-05-22 NOTE — Telephone Encounter (Signed)
[place] calling to verify if disability forms for the pt were faxed to our office.  According to our medical records dept the disability forms were faxed and received by our office, and are now located at [facility] for process for the next 14 days.  Tried Ambulance person back, and number provided is invalid and inaccurate.  Medical records aware that [employer] is calling.  Will update primary nurse and med records to follow-up on.

## 2014-05-22 NOTE — Telephone Encounter (Signed)
Calling wanting a letter to return to work on Monday.  Per My Chart message he sent to Dr. Burt Knack he occ lifts 50 lbs.but probably get someone else to assist.  Advised would have to send message to Dr. Burt Knack to see if OK to return to work.  Dr. Burt Knack replied that he may return to work part time for one week starting Mon 05/25/14 with no restrictions and then can return full time. Pt requested I fax letter to [facility] office for him to pick up. Faxed to [place] in [place] office.

## 2014-05-22 NOTE — Telephone Encounter (Signed)
New message      Calling to get the status of the disability forms sent from [employer] to [place]

## 2014-06-29 ENCOUNTER — Telehealth: Payer: Self-pay | Admitting: Cardiovascular Disease

## 2014-06-29 NOTE — Telephone Encounter (Signed)
Ruby at [facility] needs clarification of back to work instructions.

## 2014-06-29 NOTE — Telephone Encounter (Signed)
New MEssage  Rn from [place] calling to verify/confirm that pt can go back to work- full duty. Please call back and discuss.

## 2014-06-29 NOTE — Telephone Encounter (Signed)
Letter dated 05/22/14 indicates pt can return to work 06/01/14 without restrictions. Avoid lifting over 50 lbs.  Ruby asking if pt can return to work 06/01/14 without any restrictions or if pt can return to work 06/01/14 with 50 lb weight restriction.  If pt has a 50 lb weight restriction when he returns to work 06/01/14, is there a time limit on that 50 lb weight restriction?  Ruby advised I will forward to Dr Burt Knack for review.

## 2014-06-30 NOTE — Telephone Encounter (Signed)
Changed form to 'no restrictions' 06/01/2014.  Sherren Mocha 06/30/2014 5:47 PM

## 2014-07-01 NOTE — Telephone Encounter (Signed)
I left a message on Timothy Yoder's confidential voicemail that the pt could return to work with no restrictions 06/01/14.  Dr Burt Knack has completed [employer] paperwork and this has been given to medical records.

## 2014-07-03 ENCOUNTER — Telehealth: Payer: Self-pay | Admitting: Cardiovascular Disease

## 2014-07-03 NOTE — Telephone Encounter (Signed)
LVM for Pt FMLA ready For Pick up.

## 2014-07-03 NOTE — Telephone Encounter (Signed)
Per pt Mail FMLA to pt home address, placed in mail today.

## 2014-07-31 ENCOUNTER — Encounter: Payer: Self-pay | Admitting: Cardiovascular Disease

## 2014-07-31 ENCOUNTER — Ambulatory Visit (INDEPENDENT_AMBULATORY_CARE_PROVIDER_SITE_OTHER): Payer: BLUE CROSS/BLUE SHIELD | Admitting: Cardiovascular Disease

## 2014-07-31 ENCOUNTER — Other Ambulatory Visit (INDEPENDENT_AMBULATORY_CARE_PROVIDER_SITE_OTHER): Payer: BLUE CROSS/BLUE SHIELD | Admitting: *Deleted

## 2014-07-31 VITALS — BP 110/70 | HR 67 | Ht 69.0 in | Wt 158.0 lb

## 2014-07-31 DIAGNOSIS — I2510 Atherosclerotic heart disease of native coronary artery without angina pectoris: Secondary | ICD-10-CM | POA: Diagnosis not present

## 2014-07-31 DIAGNOSIS — E785 Hyperlipidemia, unspecified: Secondary | ICD-10-CM | POA: Diagnosis not present

## 2014-07-31 LAB — COMPREHENSIVE METABOLIC PANEL
ALK PHOS: 154 U/L — AB (ref 39–117)
ALT: 40 U/L (ref 0–53)
AST: 30 U/L (ref 0–37)
Albumin: 4 g/dL (ref 3.5–5.2)
BUN: 13 mg/dL (ref 6–23)
CALCIUM: 9.5 mg/dL (ref 8.4–10.5)
CO2: 29 mEq/L (ref 19–32)
Chloride: 105 mEq/L (ref 96–112)
Creatinine, Ser: 0.95 mg/dL (ref 0.40–1.50)
GFR: 85.87 mL/min (ref >60.00)
Glucose, Bld: 91 mg/dL (ref 70–99)
POTASSIUM: 4.4 meq/L (ref 3.5–5.1)
Sodium: 139 mEq/L (ref 135–145)
Total Bilirubin: 0.5 mg/dL (ref 0.2–1.2)
Total Protein: 6.9 g/dL (ref 6.0–8.3)

## 2014-07-31 LAB — LIPID PANEL
CHOL/HDL RATIO: 3
Cholesterol: 86 mg/dL (ref 0–200)
HDL: 25.6 mg/dL — AB (ref >39.00)
LDL Cholesterol: 45 mg/dL (ref 0–99)
NonHDL: 60.4
TRIGLYCERIDES: 76 mg/dL (ref 0.0–149.0)
VLDL: 15.2 mg/dL (ref 0.0–40.0)

## 2014-07-31 MED ORDER — METOPROLOL TARTRATE 25 MG PO TABS: 12.5000 mg | ORAL_TABLET | Freq: Two times a day (BID) | ORAL | Status: DC

## 2014-07-31 NOTE — Patient Instructions (Signed)
Your physician has recommended you make the following change in your medication:  1. STOP Losartan 2. DECREASE Metoprolol Tartrate 25mg  take one-half tablet by mouth twice a day  Your physician wants you to follow-up in: 3 MONTHS with Melina Copa PA. You will receive a reminder letter in the mail two months in advance. If you don't receive a letter, please call our office to schedule the follow-up appointment.  Your physician wants you to follow-up in: January 2017 with Dr Burt Knack.  You will receive a reminder letter in the mail two months in advance. If you don't receive a letter, please call our office to schedule the follow-up appointment.

## 2014-07-31 NOTE — Progress Notes (Signed)
Cardiology Office Note Date:  07/31/2014   ID:  Timothy Yoder, DOB [birth year], MRN [id number]  PCP:  Madelyn Brunner, MD  Cardiologist:  Sherren Mocha, MD    Chief Complaint  Patient presents with  . Follow-up    STEMI     History of Present Illness: Timothy Yoder is a 59 y.o. male who presents for follow-up of coronary artery disease. The patient initially presented 05/03/2014 with an acute anterolateral STEMI. He was noted to have severe stenosis of the mid LAD and this was felt to be his culprit lesion. The patient's LVEF is 60%. He was treated with primary PCI using a 2.75 mm drug-eluting stent. The patient was noted to have a 70% lesion in the right coronary artery and he underwent FFR which was negative. Ongoing medical therapy was recommended. He presents today for follow-up evaluation. Last seen by Melina Copa, PA-C on January 20th and was doing fairly well at that time. Other medical problems include hypertension and hyperlipidemia.  The patient is doing fairly well. He does complain of generalized fatigue, especially at the end of the day. He is participating in cardiac rehabilitation at [facility]. He tells me that his blood pressure has been low after exercise.  The patient denies chest pain, chest pressure, edema, orthopnea, PND, or heart palpitations. He does experience exertional dyspnea with moderate level activity. He also notes easy bruising.    Past Medical History  Diagnosis Date  . CAD (coronary artery disease)     a. 04/2014 anterolateral STEMI/PCI: LM nl, LAD 87m (2.75x16 Promus DES, D1 50p, LCX nl, RI nl, OM1 100 small/chronic, RCA dominant, 20-84m, 104m/d (FFR 0.90->Med Rx), RPDA 50, RPLA 70, EF 60%.  . Gastroesophageal reflux disease   . BPH (benign prostatic hyperplasia)   . HTN (hypertension)   . Hyperlipidemia   . Hypokalemia   . Former tobacco use     Past Surgical History  Procedure Laterality Date  . Left heart catheterization with coronary  angiogram N/A 05/03/2014    Procedure: LEFT HEART CATHETERIZATION WITH CORONARY ANGIOGRAM;  Surgeon: Blane Ohara, MD;  Location: [facility] CATH LAB;  Service: Cardiovascular;  Laterality: N/A;  . Percutaneous coronary stent intervention (pci-s) N/A 05/05/2014    Procedure: PERCUTANEOUS CORONARY STENT INTERVENTION (PCI-S);  Surgeon: Peter M Martinique, MD;  Location: [facility] CATH LAB;  Service: Cardiovascular;  Laterality: N/A;    Current Outpatient Prescriptions  Medication Sig Dispense Refill  . aspirin 81 MG chewable tablet Chew 1 tablet (81 mg total) by mouth daily.    Marland Kitchen atorvastatin (LIPITOR) 80 MG tablet Take 1 tablet (80 mg total) by mouth daily at 6 PM. 30 tablet 3  . metoprolol tartrate (LOPRESSOR) 25 MG tablet Take 0.5 tablets (12.5 mg total) by mouth 2 (two) times daily. 30 tablet 11  . nitroGLYCERIN (NITROSTAT) 0.4 MG SL tablet Place 1 tablet (0.4 mg total) under the tongue every 5 (five) minutes x 3 doses as needed for chest pain. 25 tablet 3  . omeprazole (PRILOSEC) 40 MG capsule Take 40 mg by mouth daily.    . potassium chloride SA (K-DUR,KLOR-CON) 20 MEQ tablet Take 1 tablet (20 mEq total) by mouth daily. 30 tablet 3  . tamsulosin (FLOMAX) 0.4 MG CAPS capsule Take 0.4 mg by mouth daily after supper.    . ticagrelor (BRILINTA) 90 MG TABS tablet Take 1 tablet (90 mg total) by mouth 2 (two) times daily. 180 tablet 3   No current facility-administered medications  for this visit.    Allergies:   Review of patient's allergies indicates no known allergies.   Social History:  The patient  reports that he has quit smoking. He has never used smokeless tobacco. He reports that he does not drink alcohol or use illicit drugs.   Family History:  The patient's family history includes CAD (age of onset: 44) in his father; CAD (age of onset: 72) in his mother.    ROS:  Please see the history of present illness.  Otherwise, review of systems is positive for shortness of breath, easy bruising, excessive  fatigue.  All other systems are reviewed and negative.    PHYSICAL EXAM: VS:  BP 110/70 mmHg  Pulse 67  Ht 5\' 9"  (1.753 m)  Wt 158 lb (71.668 kg)  BMI 23.32 kg/m2  SpO2 95% , BMI Body mass index is 23.32 kg/(m^2). GEN: Well nourished, well developed, in no acute distress HEENT: normal Neck: no JVD, no masses. No carotid bruits Cardiac: RRR without murmur or gallop                Respiratory:  clear to auscultation bilaterally, normal work of breathing GI: soft, nontender, nondistended, + BS MS: no deformity or atrophy Ext: no pretibial edema, pedal pulses 2+= bilaterally Skin: warm and dry, no rash Neuro:  Strength and sensation are intact Psych: euthymic mood, full affect  EKG:  EKG is not ordered today.  Recent Labs: 05/03/2014: ALT 18; B Natriuretic Peptide 20.1; TSH 1.007 05/05/2014: Hemoglobin 13.4; Platelets 220 05/13/2014: BUN 10; Creatinine 1.04; Potassium 4.3; Sodium 137   Lipid Panel     Component Value Date/Time   CHOL 167 05/04/2014 0033   TRIG 87 05/04/2014 0033   HDL 27* 05/04/2014 0033   CHOLHDL 6.2 05/04/2014 0033   VLDL 17 05/04/2014 0033   LDLCALC 123* 05/04/2014 0033     Wt Readings from Last 3 Encounters:  07/31/14 158 lb (71.668 kg)  05/13/14 157 lb 6.4 oz (71.396 kg)  05/06/14 155 lb 4.8 oz (70.444 kg)    ASSESSMENT AND PLAN: 1.  Coronary artery disease, native vessel: The patient is stable without symptoms of angina. I think he is likely experiencing some side effects of his medications. With low blood pressure, I advised him to stop losartan and decrease metoprolol to 12.5 mg twice daily. He does have normal LV systolic function and no diabetes, so an ARB is not a mandatory part of his medical program. I would like him to see Melina Copa, PA in follow-up in 3 months. I will plan on seeing him back when he is 1 year out from his MI.  2. Hyperlipidemia: The patient is tolerating atorvastatin at high dose. He had lipids and LFTs drawn this  morning.  Current medicines are reviewed with the patient today.  The patient does not have concerns regarding medicines.  Labs/ tests ordered today include:  No orders of the defined types were placed in this encounter.    Disposition:   FU 3 months with Melina Copa, PA-C  Signed, Sherren Mocha, MD  07/31/2014 8:17 AM    [facility] [address], [place], [place]  [address] Phone: [phone]; Fax: [phone]

## 2014-07-31 NOTE — Addendum Note (Signed)
Addended by: Eulis Foster on: 07/31/2014 07:34 AM   Modules accepted: Orders

## 2014-08-07 ENCOUNTER — Telehealth: Payer: Self-pay | Admitting: *Deleted

## 2014-08-07 NOTE — Telephone Encounter (Signed)
ptcb and has been notified of lab results with verbal understanding. Pt advised to f/uw/PCP at some time in reference to LFT w/the Alkaline Phosphatase elevated. Pt said ok and thank you. I will fax results to PCP for pt.

## 2014-08-07 NOTE — Telephone Encounter (Signed)
lmptcb to go over lab results 

## 2014-09-11 DIAGNOSIS — R55 Syncope and collapse: Secondary | ICD-10-CM | POA: Insufficient documentation

## 2014-09-14 ENCOUNTER — Telehealth: Payer: Self-pay | Admitting: Cardiovascular Disease

## 2014-09-14 NOTE — Telephone Encounter (Signed)
New message      Pt c/o BP issue: STAT if pt c/o blurred vision, one-sided weakness or slurred speech  1. What are your last 5 BP readings?  Wife did not know  2. Are you having any other symptoms (ex. Dizziness, headache, blurred vision, passed out)?  Pt passed out last friday  3. What is your BP issue? Pt was hospitalized in [place] on Friday because of bp dropped and pt fainted bp was 70/40.  Pt was admitted for observation.  He wore a 24hr monitor.  Pt was released.  Pt is scheduled for a stess test on wed the 25th in [place].  He works in [employer] (medical). Pt was already in cardiac rehab because of a heart attack jan 2016.  Doctors in [place] want to make sure pt can proceed with stress test because of this new bp issure.

## 2014-09-14 NOTE — Telephone Encounter (Signed)
I spoke with the pt's wife and there are 2 issues that need to be addressed:  1.  The pt is scheduled for a treadmill test on Wednesday as part of his conclusion testing for cardiac rehab. Cardiac Rehab will not perform stress test unless approved by Dr Burt Knack.  The pt's records are in [place] for review.    2.   Per DC summary: Other Instructions  Follow-up with physician (specify)  Please follow up with your Cardiologist about your metoprolol in the next 1-2 weeks. He may want to stop this.   I will forward this information to Dr Burt Knack to review.

## 2014-09-14 NOTE — Telephone Encounter (Signed)
Reviewed notes from [place]. Treadmill test is fine. Would stop metoprolol. thx  Sherren Mocha 09/14/2014 12:51 PM

## 2014-09-16 NOTE — Telephone Encounter (Signed)
I spoke with the pt's wife and made her aware that the pt can stop Metoprolol per Dr Burt Knack.  The pt is also okay to proceed with treadmill stress test in cardiac rehab. The pt participates in cardiac rehab and [facility] (phone [phone]).  I contacted the wellness center and confirmed that Mr Kading is a patient. I will fax this note to [phone] so the pt can have treadmill test performed.

## 2014-10-01 ENCOUNTER — Emergency Department
Admission: EM | Admit: 2014-10-01 | Discharge: 2014-10-01 | Disposition: A | Discharge status: Home / Self Care | Payer: BLUE CROSS/BLUE SHIELD | Attending: Emergency Medicine | Admitting: Emergency Medicine

## 2014-10-01 ENCOUNTER — Encounter: Payer: Self-pay | Admitting: Emergency Medicine

## 2014-10-01 ENCOUNTER — Telehealth: Payer: Self-pay | Admitting: Cardiovascular Disease

## 2014-10-01 DIAGNOSIS — Z79899 Other long term (current) drug therapy: Secondary | ICD-10-CM | POA: Insufficient documentation

## 2014-10-01 DIAGNOSIS — R040 Epistaxis: Secondary | ICD-10-CM | POA: Insufficient documentation

## 2014-10-01 DIAGNOSIS — Z87891 Personal history of nicotine dependence: Secondary | ICD-10-CM | POA: Insufficient documentation

## 2014-10-01 DIAGNOSIS — Z7982 Long term (current) use of aspirin: Secondary | ICD-10-CM | POA: Diagnosis not present

## 2014-10-01 DIAGNOSIS — I10 Essential (primary) hypertension: Secondary | ICD-10-CM | POA: Insufficient documentation

## 2014-10-01 LAB — BASIC METABOLIC PANEL
Anion gap: 8 (ref 5–15)
BUN: 9 mg/dL (ref 6–20)
CALCIUM: 9.3 mg/dL (ref 8.9–10.3)
CO2: 27 mmol/L (ref 22–32)
CREATININE: 0.97 mg/dL (ref 0.61–1.24)
Chloride: 107 mmol/L (ref 101–111)
GFR calc non Af Amer: >60 mL/min (ref >60)
GLUCOSE: 101 mg/dL — AB (ref 65–99)
Potassium: 3.7 mmol/L (ref 3.5–5.1)
SODIUM: 142 mmol/L (ref 135–145)

## 2014-10-01 LAB — PROTIME-INR
INR: 1
PROTHROMBIN TIME: 13.4 s (ref 11.4–15.0)

## 2014-10-01 LAB — CBC WITH DIFFERENTIAL/PLATELET
BASOS PCT: 3 %
Basophils Absolute: 0.2 10*3/uL — ABNORMAL HIGH (ref 0–0.1)
EOS PCT: 2 %
Eosinophils Absolute: 0.1 10*3/uL (ref 0–0.7)
HCT: 41.1 % (ref 40.0–52.0)
Hemoglobin: 13.7 g/dL (ref 13.0–18.0)
Lymphocytes Relative: 20 %
Lymphs Abs: 1.2 10*3/uL (ref 1.0–3.6)
MCH: 32.2 pg (ref 26.0–34.0)
MCHC: 33.3 g/dL (ref 32.0–36.0)
MCV: 96.5 fL (ref 80.0–100.0)
Monocytes Absolute: 0.4 10*3/uL (ref 0.2–1.0)
Monocytes Relative: 7 %
Neutro Abs: 3.9 10*3/uL (ref 1.4–6.5)
Neutrophils Relative %: 68 %
Platelets: 232 10*3/uL (ref 150–440)
RBC: 4.26 MIL/uL — ABNORMAL LOW (ref 4.40–5.90)
RDW: 13.8 % (ref 11.5–14.5)
WBC: 5.8 10*3/uL (ref 3.8–10.6)

## 2014-10-01 MED ORDER — CEPHALEXIN 500 MG PO CAPS
ORAL_CAPSULE | ORAL | Status: AC
  Administered 2014-10-01: 500 mg via ORAL
  Filled 2014-10-01: qty 1

## 2014-10-01 MED ORDER — CEPHALEXIN 500 MG PO CAPS: 500.0000 mg | ORAL_CAPSULE | Freq: Three times a day (TID) | ORAL | Status: DC

## 2014-10-01 MED ORDER — CEPHALEXIN 500 MG PO CAPS
500.0000 mg | ORAL_CAPSULE | Freq: Once | ORAL | Status: AC
  Administered 2014-10-01: 500 mg via ORAL

## 2014-10-01 NOTE — ED Notes (Signed)
Pt reports that his nose started bleeding around 0100, they tried to stop it at home with what the call a nurse told him to do. Nose is starting to slow but still bleeding. Pt is very nervous and shaking. He takes blood thinners for a heart attack in Jan.

## 2014-10-01 NOTE — ED Notes (Signed)
MD at bedside. Using nose bleed clip and kit t bedside. Ice pack applied to bridge of nose. Bleeding from left nare.

## 2014-10-01 NOTE — Telephone Encounter (Signed)
New message    Wife calling patient went to emergency room last night - [place] - Emergency room told him to hold medication today.    Pt C/O medication issue:  1. Name of Medication: brilinta  90 mg   2. How are you currently taking this medication (dosage and times per day)? Twice daily   3. Are you having a reaction (difficulty breathing--STAT)? Nose bleeds.   4. What is your medication issue? Wife stating - was told never to stop medication

## 2014-10-01 NOTE — Discharge Instructions (Signed)
1. Take antibiotics as prescribed (Keflex 500 mg 3 times daily 7 days). 2. Keep nasal packing in place. 3. Return to the ER for worsening symptoms, recurrent or persistent bleeding, persistent vomiting, difficulty breathing or other concerns.  Nosebleed Nosebleeds can be caused by many conditions, including trauma, infections, polyps, foreign bodies, dry mucous membranes or climate, medicines, and air conditioning. Most nosebleeds occur in the front of the nose. Because of this location, most nosebleeds can be controlled by pinching the nostrils gently and continuously for at least 10 to 20 minutes. The long, continuous pressure allows enough time for the blood to clot. If pressure is released during that 10 to 20 minute time period, the process may have to be started again. The nosebleed may stop by itself or quit with pressure, or it may need concentrated heating (cautery) or pressure from packing. HOME CARE INSTRUCTIONS   If your nose was packed, try to maintain the pack inside until your health care provider removes it. If a gauze pack was used and it starts to fall out, gently replace it or cut the end off. Do not cut if a balloon catheter was used to pack the nose. Otherwise, do not remove unless instructed.  Avoid blowing your nose for 12 hours after treatment. This could dislodge the pack or clot and start the bleeding again.  If the bleeding starts again, sit up and bend forward, gently pinching the front half of your nose continuously for 20 minutes.  If bleeding was caused by dry mucous membranes, use over-the-counter saline nasal spray or gel. This will keep the mucous membranes moist and allow them to heal. If you must use a lubricant, choose the water-soluble variety. Use it only sparingly and not within several hours of lying down.  Do not use petroleum jelly or mineral oil, as these may drip into the lungs and cause serious problems.  Maintain humidity in your home by using less  air conditioning or by using a humidifier.  Do not use aspirin or medicines which make bleeding more likely. Your health care provider can give you recommendations on this.  Resume normal activities as you are able, but try to avoid straining, lifting, or bending at the waist for several days.  If the nosebleeds become recurrent and the cause is unknown, your health care provider may suggest laboratory tests. SEEK MEDICAL CARE IF: You have a fever. SEEK IMMEDIATE MEDICAL CARE IF:   Bleeding recurs and cannot be controlled.  There is unusual bleeding from or bruising on other parts of the body.  Nosebleeds continue.  There is any worsening of the condition which originally brought you in.  You become light-headed, feel faint, become sweaty, or vomit blood. MAKE SURE YOU:   Understand these instructions.  Will watch your condition.  Will get help right away if you are not doing well or get worse. Document Released: 01/18/2005 Document Revised: 08/25/2013 Document Reviewed: 03/11/2009 [facility] Patient Information 2015 [place], [place]. This information is not intended to replace advice given to you by your health care provider. Make sure you discuss any questions you have with your health care provider.

## 2014-10-01 NOTE — Telephone Encounter (Signed)
I spoke with the pt and his wife and made them aware that per Dr Burt Knack the pt was okay to hold Brilinta today and should resume tomorrow, continue aspirin. The pt's wife did contact a local ENT and because the pt had bleeding today they wanted the pt to call the office tomorrow about an appointment.   The pt's wife was also concerned about the BP being elevated while in the ER.  I made her aware that the pt was most likely anxious due to uncontrolled bleeding and this can increase the BP.  The pt's BP at home yesterday was 115/72, pulse 77. I advised her to contact the office with any other questions or concerns.

## 2014-10-01 NOTE — ED Provider Notes (Signed)
[facility] Emergency Department Provider Note  ____________________________________________  Time seen: Approximately 2:32 AM  I have reviewed the triage vital signs and the nursing notes.   HISTORY  Chief Complaint Epistaxis    HPI Timothy Yoder is a 59 y.o. male who presents from home for left-sided nosebleed which started approximately 1.5 hours ago. Patient awoke with bleeding. Patient denies recent congestion or trauma/injury. Patient takes Brilinta. Patient denies feeling lightheaded, dizzy, nausea, headache.No history of nosebleeds.   Past Medical History  Diagnosis Date  . CAD (coronary artery disease)     a. 04/2014 anterolateral STEMI/PCI: LM nl, LAD 83m(2.75x16 Promus DES, D1 50p, LCX nl, RI nl, OM1 100 small/chronic, RCA dominant, 20-350m7027m(FFR 0.90->Med Rx), RPDA 50, RPLA 70, EF 60%.  . Gastroesophageal reflux disease   . BPH (benign prostatic hyperplasia)   . HTN (hypertension)   . Hyperlipidemia   . Hypokalemia   . Former tobacco use     Patient Active Problem List   Diagnosis Date Noted  . Former tobacco use   . CAD S/P percutaneous coronary angioplasty 05/06/2014  . Essential hypertension 05/06/2014  . Hyperlipidemia 05/06/2014  . Hypokalemia 05/06/2014  . Insomnia 05/06/2014  . Acute MI, anterolateral wall, initial episode of care 05/03/2014  . Acute anterolateral myocardial infarction 05/03/2014    Past Surgical History  Procedure Laterality Date  . Left heart catheterization with coronary angiogram N/A 05/03/2014    Procedure: LEFT HEART CATHETERIZATION WITH CORONARY ANGIOGRAM;  Surgeon: MicBlane OharaD;  [facility];  Service: Cardiovascular;  Laterality: N/A;  . Percutaneous coronary stent intervention (pci-s) N/A 05/05/2014    Procedure: PERCUTANEOUS CORONARY STENT INTERVENTION (PCI-S);  Surgeon: Peter M JorMartiniqueD;  [facility];  Service: Cardiovascular;  Laterality: N/A;    Current  Outpatient Rx  Name  Route  Sig  Dispense  Refill  . aspirin 81 MG chewable tablet   Oral   Chew 1 tablet (81 mg total) by mouth daily.         . aMarland Kitchenorvastatin (LIPITOR) 80 MG tablet   Oral   Take 1 tablet (80 mg total) by mouth daily at 6 PM.   30 tablet   3   . nitroGLYCERIN (NITROSTAT) 0.4 MG SL tablet   Sublingual   Place 1 tablet (0.4 mg total) under the tongue every 5 (five) minutes x 3 doses as needed for chest pain.   25 tablet   3   . omeprazole (PRILOSEC) 40 MG capsule   Oral   Take 40 mg by mouth daily.         . potassium chloride SA (K-DUR,KLOR-CON) 20 MEQ tablet   Oral   Take 1 tablet (20 mEq total) by mouth daily.   30 tablet   3   . tamsulosin (FLOMAX) 0.4 MG CAPS capsule   Oral   Take 0.4 mg by mouth daily after supper.         . ticagrelor (BRILINTA) 90 MG TABS tablet   Oral   Take 1 tablet (90 mg total) by mouth 2 (two) times daily.   180 tablet   3     Allergies Review of patient's allergies indicates no known allergies.  Family History  Problem Relation Age of Onset  . CAD Father 40 54 Died of a massive MI  . CAD Mother 70 78 Died of an MI    Social History History  Substance Use  Topics  . Smoking status: Former Research scientist (life sciences)  . Smokeless tobacco: Never Used  . Alcohol Use: No    Review of Systems Constitutional: No fever/chills Eyes: No visual changes. ENT: Positive for nosebleed. No sore throat. Cardiovascular: Denies chest pain. Respiratory: Denies shortness of breath. Gastrointestinal: No abdominal pain.  No nausea, no vomiting.  No diarrhea.  No constipation. Genitourinary: Negative for dysuria. Musculoskeletal: Negative for back pain. Skin: Negative for rash. Neurological: Negative for headaches, focal weakness or numbness.  10-point ROS otherwise negative.  ____________________________________________   PHYSICAL EXAM:  VITAL SIGNS: ED Triage Vitals  Enc Vitals Group     BP 10/01/14 0218 168/92 mmHg     Pulse  Rate 10/01/14 0218 86     Resp 10/01/14 0218 18     Temp 10/01/14 0218 97.5 F (36.4 C)     Temp Source 10/01/14 0218 Oral     SpO2 10/01/14 0218 100 %     Weight 10/01/14 0215 155 lb (70.308 kg)     Height 10/01/14 0215 '5\' 9"'$  (1.753 m)     Head Cir --      Peak Flow --      Pain Score 10/01/14 0217 0     Pain Loc --      Pain Edu? --      Excl. in [place]? --     Constitutional: Alert and oriented. Well appearing and in mild acute distress. Eyes: Conjunctivae are normal. PERRL. EOMI. Head: Atraumatic. Nose: Nasal clamp in place. Mouth/Throat: Mucous membranes are moist.  Oropharynx non-erythematous. Neck: No stridor.   Cardiovascular: Normal rate, regular rhythm. Grossly normal heart sounds.  Good peripheral circulation. Respiratory: Normal respiratory effort.  No retractions. Lungs CTAB. Gastrointestinal: Soft and nontender. No distention. No abdominal bruits. No CVA tenderness. Musculoskeletal: No lower extremity tenderness nor edema.  No joint effusions. Neurologic:  Normal speech and language. No gross focal neurologic deficits are appreciated. Speech is normal. No gait instability. Skin:  Skin is warm, dry and intact. No rash noted. Psychiatric: Mood and affect are normal. Speech and behavior are normal.  ____________________________________________   LABS (all labs ordered are listed, but only abnormal results are displayed)  Labs Reviewed  CBC WITH DIFFERENTIAL/PLATELET - Abnormal; Notable for the following:    RBC 4.26 (*)    Basophils Absolute 0.2 (*)    All other components within normal limits  BASIC METABOLIC PANEL - Abnormal; Notable for the following:    Glucose, Bld 101 (*)    All other components within normal limits  PROTIME-INR   ____________________________________________  EKG  None ____________________________________________  RADIOLOGY  None ____________________________________________   PROCEDURES  Procedure(s) performed: None  Critical  Care performed: No  ____________________________________________   INITIAL IMPRESSION / ASSESSMENT AND PLAN / ED COURSE  Pertinent labs & imaging results that were available during my care of the patient were reviewed by me and considered in my medical decision making (see chart for details).  59 year old male who presents with left-sided nosebleed. Cotton ball soaked with lidocaine + Afrin and inserted into the left nares. Nasal clamp replaced. Ice pack applied. Will check some basic labs and recheck in 20 minutes.  ----------------------------------------- 3:23 AM on 10/01/2014 -----------------------------------------  Packing removed. Right nares within normal limits. Left nares remains with slow trickle of bleeding. Merocel nasal tampon placed. Patient tolerated procedure well. Will continue to observe for further bleeding.  ----------------------------------------- 4:22 AM on 10/01/2014 -----------------------------------------  No further bleeding. Merocel nasal tampon remains in place. Patient voices  no complaints. Plan for Keflex and ENT follow-up. Advised patient to hold tomorrow's doses of Brilinta, then resume the following day. Strict return precautions given. Patient and spouse verbalized understanding and agree with plan of care. ____________________________________________   FINAL CLINICAL IMPRESSION(S) / ED DIAGNOSES  Final diagnoses:  Left-sided nosebleed      Paulette Blanch, MD 10/01/14 [phone]

## 2014-10-07 ENCOUNTER — Encounter: Payer: Self-pay | Admitting: Cardiovascular Disease

## 2014-10-14 DIAGNOSIS — Z86010 Personal history of colonic polyps: Secondary | ICD-10-CM | POA: Insufficient documentation

## 2014-10-14 DIAGNOSIS — Z860101 Personal history of adenomatous and serrated colon polyps: Secondary | ICD-10-CM | POA: Insufficient documentation

## 2014-11-24 ENCOUNTER — Ambulatory Visit (INDEPENDENT_AMBULATORY_CARE_PROVIDER_SITE_OTHER): Payer: BLUE CROSS/BLUE SHIELD | Admitting: Nurse Practitioner

## 2014-11-24 ENCOUNTER — Encounter: Payer: Self-pay | Admitting: Nurse Practitioner

## 2014-11-24 VITALS — BP 130/86 | HR 86 | Ht 69.0 in | Wt 154.8 lb

## 2014-11-24 DIAGNOSIS — I10 Essential (primary) hypertension: Secondary | ICD-10-CM | POA: Diagnosis not present

## 2014-11-24 DIAGNOSIS — I2510 Atherosclerotic heart disease of native coronary artery without angina pectoris: Secondary | ICD-10-CM | POA: Diagnosis not present

## 2014-11-24 DIAGNOSIS — E785 Hyperlipidemia, unspecified: Secondary | ICD-10-CM

## 2014-11-24 MED ORDER — NITROGLYCERIN 0.4 MG SL SUBL: 0.4000 mg | SUBLINGUAL_TABLET | SUBLINGUAL | Status: DC | PRN

## 2014-11-24 NOTE — Progress Notes (Signed)
CARDIOLOGY OFFICE NOTE  Date:  11/24/2014    Timothy Yoder Date of Birth: 01/13/1956 Medical Record #[id number]  PCP:  Madelyn Brunner, MD  Cardiologist:  Burt Knack    Chief Complaint  Patient presents with  . Coronary Artery Disease    3 month check - seen for Dr. Burt Knack    History of Present Illness: Timothy Yoder is a 59 y.o. male who presents today for a follow up visit. This is a 3 month check. Seen for Dr. Burt Knack. He has known coronary artery disease, HTN, HLD and is a former smoker. The patient initially presented 05/03/2014 with an acute anterolateral STEMI. He was noted to have severe stenosis of the mid LAD and this was felt to be his culprit lesion. The patient's LVEF is 60%. He was treated with primary PCI using a 2.75 mm drug-eluting stent. The patient was noted to have a 70% lesion in the right coronary artery and he underwent FFR which was negative. Ongoing medical therapy was recommended.   He was last seen in April and felt to be doing well. Doing cardiac rehab in [place]. Endorsed some fatigue and his medicines were adjusted.  Comes back today. Here with his wife. Doing ok. No chest pain. He is no longer on his Metoprolol - stopped as of last day at rehab due to a "blackout spell". He had finished exercising - BP low. Dr. Burt Knack stopped the metoprolol. He has not had recurrence. He walks 8 to 10 miles a day and has no issues. BP good at home. Needs NTG refilled. He is no longer fatigued.   Past Medical History  Diagnosis Date  . CAD (coronary artery disease)     a. 04/2014 anterolateral STEMI/PCI: LM nl, LAD 55m(2.75x16 Promus DES, D1 50p, LCX nl, RI nl, OM1 100 small/chronic, RCA dominant, 20-35m7036m(FFR 0.90->Med Rx), RPDA 50, RPLA 70, EF 60%.  . Gastroesophageal reflux disease   . BPH (benign prostatic hyperplasia)   . HTN (hypertension)   . Hyperlipidemia   . Hypokalemia   . Former tobacco use     Past Surgical History  Procedure Laterality  Date  . Left heart catheterization with coronary angiogram N/A 05/03/2014    Procedure: LEFT HEART CATHETERIZATION WITH CORONARY ANGIOGRAM;  Surgeon: MicBlane OharaD;  [facility];  Service: Cardiovascular;  Laterality: N/A;  . Percutaneous coronary stent intervention (pci-s) N/A 05/05/2014    Procedure: PERCUTANEOUS CORONARY STENT INTERVENTION (PCI-S);  Surgeon: Peter M JorMartiniqueD;  [facility];  Service: Cardiovascular;  Laterality: N/A;     Medications: Current Outpatient Prescriptions  Medication Sig Dispense Refill  . aspirin 81 MG chewable tablet Chew 1 tablet (81 mg total) by mouth daily.    . aMarland Kitchenorvastatin (LIPITOR) 80 MG tablet Take 1 tablet (80 mg total) by mouth daily at 6 PM. 30 tablet 3  . nitroGLYCERIN (NITROSTAT) 0.4 MG SL tablet Place 1 tablet (0.4 mg total) under the tongue every 5 (five) minutes x 3 doses as needed for chest pain. 25 tablet 3  . omeprazole (PRILOSEC) 40 MG capsule Take 40 mg by mouth daily.    . potassium chloride SA (K-DUR,KLOR-CON) 20 MEQ tablet Take 1 tablet (20 mEq total) by mouth daily. 30 tablet 3  . tamsulosin (FLOMAX) 0.4 MG CAPS capsule Take 0.4 mg by mouth daily after supper.    . ticagrelor (BRILINTA) 90 MG TABS tablet Take 1 tablet (90 mg total)  by mouth 2 (two) times daily. 180 tablet 3   No current facility-administered medications for this visit.    Allergies: No Known Allergies  Social History: The patient  reports that he has quit smoking. He has never used smokeless tobacco. He reports that he does not drink alcohol or use illicit drugs.   Family History: The patient's family history includes CAD (age of onset: 50) in his father; CAD (age of onset: 78) in his mother.   Review of Systems: Please see the history of present illness.   Otherwise, the review of systems is positive for none.   All other systems are reviewed and negative.   Physical Exam: VS:  BP 130/86 mmHg  Pulse 86  Ht '5\' 9"'$  (1.753 m)  Wt 154  lb 12.8 oz (70.217 kg)  BMI 22.85 kg/m2  SpO2 98% .  BMI Body mass index is 22.85 kg/(m^2).  Wt Readings from Last 3 Encounters:  11/24/14 154 lb 12.8 oz (70.217 kg)  10/01/14 155 lb (70.308 kg)  07/31/14 158 lb (71.668 kg)    General: Pleasant. Well developed, well nourished and in no acute distress.  HEENT: Normal. Neck: Supple, no JVD, carotid bruits, or masses noted.  Cardiac: Regular rate and rhythm. No murmurs, rubs, or gallops. No edema.  Respiratory:  Lungs are clear to auscultation bilaterally with normal work of breathing.  GI: Soft and nontender.  MS: No deformity or atrophy. Gait and ROM intact. Skin: Warm and dry. Color is normal.  Neuro:  Strength and sensation are intact and no gross focal deficits noted.  Psych: Alert, appropriate and with normal affect.   LABORATORY DATA:  EKG:  EKG is not ordered today.   Lab Results  Component Value Date   WBC 5.8 10/01/2014   HGB 13.7 10/01/2014   HCT 41.1 10/01/2014   PLT 232 10/01/2014   GLUCOSE 101* 10/01/2014   CHOL 86 07/31/2014   TRIG 76.0 07/31/2014   HDL 25.60* 07/31/2014   LDLCALC 45 07/31/2014   ALT 40 07/31/2014   AST 30 07/31/2014   NA 142 10/01/2014   K 3.7 10/01/2014   CL 107 10/01/2014   CREATININE 0.97 10/01/2014   BUN 9 10/01/2014   CO2 27 10/01/2014   TSH 1.007 05/03/2014   INR 1.00 10/01/2014    BNP (last 3 results)  Recent Labs  05/03/14 1928  BNP 20.1    ProBNP (last 3 results) No results for input(s): PROBNP in the last 8760 hours.   Other Studies Reviewed Today: N/A  Assessment/Plan: 1. Coronary artery disease, native vessel: He is doing well clinically without symptoms. Continue with CV risk factor modification.   2. Hyperlipidemia: The patient is tolerating atorvastatin at high dose. Lipids from April look great.  3. HTN - BP ok - I have asked him to continue to monitor.   4. Fatigue - resolved.   Current medicines are reviewed with the patient today. The patient  does not have concerns regarding medicines.  Current medicines are reviewed with the patient today.  The patient does not have concerns regarding medicines other than what has been noted above.  The following changes have been made:  See above.  Labs/ tests ordered today include:   No orders of the defined types were placed in this encounter.    Disposition:   FU with Dr. Burt Knack in January as planned.    Patient is agreeable to this plan and will call if any problems develop in the interim.  Signed: Burtis Junes, RN, ANP-C 11/24/2014 8:05 AM  [facility] [address] [place], [place]  [address] Phone: [phone] Fax: [phone]

## 2014-11-24 NOTE — Patient Instructions (Addendum)
We will be checking the following labs today - NONE   Medication Instructions:    Continue with your current medicines.     Testing/Procedures To Be Arranged:  N/A  Follow-Up:   See Dr. Burt Knack in January    Other Special Instructions:   N/A  Call the [place] office at [phone] if you have any questions, problems or concerns.

## 2015-02-17 ENCOUNTER — Telehealth: Payer: Self-pay | Admitting: Cardiovascular Disease

## 2015-02-17 NOTE — Telephone Encounter (Signed)
I spoke with the pt and he complains of a stabbing pain in his left upper chest and left ribs that occur every 2-3 days and lasts for 10-15 seconds.  The pt has also noticed over the past 3 weeks that when he exercises and goes up an incline he is more SOB.  He also notices this when he exerts himself more than his usual.  I have arranged for the pt to see Dr Burt Knack on 02/17/15. Pt instructed to proceed to the ER if he has worsening or change in symptoms.

## 2015-02-17 NOTE — Telephone Encounter (Signed)
Pt c/o Shortness Of Breath: STAT if SOB developed within the last 24 hours or pt is noticeably SOB on the phone  1. Are you currently SOB (can you hear that pt is SOB on the phone)? no  2. How long have you been experiencing SOB? 3wks ago  3. Are you SOB when sitting or when up moving around? Moving around w/strenous activity  4. Are you currently experiencing any other symptoms? Have jabbing pain around left side of chest that last a few seconds.  Pt wish to speak to nurse to see if he need to be seen. Pt stated this eposode doesn't happen everyday. Please advise pt.

## 2015-02-19 ENCOUNTER — Ambulatory Visit (INDEPENDENT_AMBULATORY_CARE_PROVIDER_SITE_OTHER): Payer: BLUE CROSS/BLUE SHIELD | Admitting: Cardiovascular Disease

## 2015-02-19 ENCOUNTER — Encounter: Payer: Self-pay | Admitting: Cardiovascular Disease

## 2015-02-19 VITALS — BP 162/78 | HR 94 | Ht 69.0 in | Wt 157.8 lb

## 2015-02-19 DIAGNOSIS — R0602 Shortness of breath: Secondary | ICD-10-CM

## 2015-02-19 DIAGNOSIS — I10 Essential (primary) hypertension: Secondary | ICD-10-CM | POA: Diagnosis not present

## 2015-02-19 DIAGNOSIS — I2510 Atherosclerotic heart disease of native coronary artery without angina pectoris: Secondary | ICD-10-CM | POA: Diagnosis not present

## 2015-02-19 DIAGNOSIS — I252 Old myocardial infarction: Secondary | ICD-10-CM

## 2015-02-19 DIAGNOSIS — E785 Hyperlipidemia, unspecified: Secondary | ICD-10-CM

## 2015-02-19 DIAGNOSIS — R072 Precordial pain: Secondary | ICD-10-CM | POA: Diagnosis not present

## 2015-02-19 DIAGNOSIS — Z9861 Coronary angioplasty status: Secondary | ICD-10-CM

## 2015-02-19 DIAGNOSIS — R079 Chest pain, unspecified: Secondary | ICD-10-CM

## 2015-02-19 NOTE — Patient Instructions (Addendum)
Medication Instructions:  Your physician recommends that you continue on your current medications as directed. Please refer to the Current Medication list given to you today.  Labwork: No new orders.   Testing/Procedures: Your physician has requested that you have a stress echocardiogram. For further information please visit [email or web address]. Please follow instruction sheet as given.  Follow-Up: Your physician recommends that you schedule a follow-up appointment in: February 2017 with Dr Burt Knack.    Any Other Special Instructions Will Be Listed Below (If Applicable).  Your physician has requested that you regularly monitor and record your blood pressure readings at home. Please use the same machine at the same time of day to check your readings and record them to bring to your follow-up visit.  If your BP is consistently above 140/90 please contact the office at [phone].       If you need a refill on your cardiac medications before your next appointment, please call your pharmacy.

## 2015-02-19 NOTE — Progress Notes (Signed)
Cardiology Office Note Date:  02/21/2015   ID:  Timothy Yoder, DOB [birth year], MRN [id number]  PCP:  Timothy Brunner, MD  Cardiologist:  Timothy Mocha, MD    No chief complaint on file.    History of Present Illness: Timothy Yoder is a 59 y.o. male who presents for follow-up of coronary artery disease. The patient initially presented in January 2016 with an acute anterolateral STEMI. He was noted to have severe stenosis of the mid LAD and was treated with drug-eluting stent platform. His LVEF was normal at 60%. He had a moderate 70% lesion in the right coronary artery and underwent pressure wire analysis which was negative for ischemia. Ongoing medical therapy was recommended.  The patient has done well. However, over the last few weeks he has experienced symptoms of exertional dyspnea and left-sided chest pain. His chest pains are described as a sharp pain, nonradiating, and not associated with physical exertion. These have occurred in the left chest as well as the left upper back. They only last 10-15 seconds. He complains of shortness of breath with exercise. He has not experienced this before. He denies orthopnea, PND, cough, fever, chills, or leg swelling. He's had no recent medication changes. Initially, he was treated with a beta blocker and ARB. However, he had symptomatic hypotension early on at cardiac rehabilitation and his medications had to be stopped. He has not had any bleeding problems on dual antiplatelet therapy with aspirin and brilinta.   Past Medical History  Diagnosis Date  . CAD (coronary artery disease)     a. 04/2014 anterolateral STEMI/PCI: LM nl, LAD 75m(2.75x16 Promus DES, D1 50p, LCX nl, RI nl, OM1 100 small/chronic, RCA dominant, 20-332m7063m(FFR 0.90->Med Rx), RPDA 50, RPLA 70, EF 60%.  . Gastroesophageal reflux disease   . BPH (benign prostatic hyperplasia)   . HTN (hypertension)   . Hyperlipidemia   . Hypokalemia   . Former tobacco use     Past  Surgical History  Procedure Laterality Date  . Left heart catheterization with coronary angiogram N/A 05/03/2014    Procedure: LEFT HEART CATHETERIZATION WITH CORONARY ANGIOGRAM;  Surgeon: MicBlane OharaD;  [facility];  Service: Cardiovascular;  Laterality: N/A;  . Percutaneous coronary stent intervention (pci-s) N/A 05/05/2014    Procedure: PERCUTANEOUS CORONARY STENT INTERVENTION (PCI-S);  Surgeon: Peter M JorMartiniqueD;  [facility];  Service: Cardiovascular;  Laterality: N/A;    Current Outpatient Prescriptions  Medication Sig Dispense Refill  . aspirin 81 MG chewable tablet Chew 1 tablet (81 mg total) by mouth daily.    . aMarland Kitchenorvastatin (LIPITOR) 80 MG tablet Take 1 tablet (80 mg total) by mouth daily at 6 PM. 30 tablet 3  . nitroGLYCERIN (NITROSTAT) 0.4 MG SL tablet Place 1 tablet (0.4 mg total) under the tongue every 5 (five) minutes x 3 doses as needed for chest pain. 25 tablet 3  . omeprazole (PRILOSEC) 40 MG capsule Take 40 mg by mouth daily.    . potassium chloride SA (K-DUR,KLOR-CON) 20 MEQ tablet Take 1 tablet (20 mEq total) by mouth daily. 30 tablet 3  . tamsulosin (FLOMAX) 0.4 MG CAPS capsule Take 0.4 mg by mouth daily after supper.    . ticagrelor (BRILINTA) 90 MG TABS tablet Take 1 tablet (90 mg total) by mouth 2 (two) times daily. 180 tablet 3   No current facility-administered medications for this visit.    Allergies:   Review of patient's allergies  indicates no known allergies.   Social History:  The patient  reports that he has quit smoking. He has never used smokeless tobacco. He reports that he does not drink alcohol or use illicit drugs.   Family History:  The patient's  family history includes CAD (age of onset: 68) in his father; CAD (age of onset: 78) in his mother.    ROS:  Please see the history of present illness.  Otherwise, review of systems is positive for easy bruising.  All other systems are reviewed and negative.    PHYSICAL  EXAM: VS:  BP 162/78 mmHg  Pulse 94  Ht '5\' 9"'$  (1.753 m)  Wt 157 lb 12.8 oz (71.578 kg)  BMI 23.29 kg/m2 , BMI Body mass index is 23.29 kg/(m^2). GEN: Well nourished, well developed, in no acute distress HEENT: normal Neck: no JVD, no masses. No carotid bruits Cardiac: RRR without murmur or gallop                Respiratory:  clear to auscultation bilaterally, normal work of breathing GI: soft, nontender, nondistended, + BS MS: no deformity or atrophy Ext: no pretibial edema, pedal pulses 2+= bilaterally Skin: warm and dry, no rash Neuro:  Strength and sensation are intact Psych: euthymic mood, full affect  EKG:  EKG is ordered today. The ekg ordered today shonormal sinus rhythm 94 bpm, within normal limits.   Recent Labs: 05/03/2014: B Natriuretic Peptide 20.1; TSH 1.007 07/31/2014: ALT 40 10/01/2014: BUN 9; Creatinine, Ser 0.97; Hemoglobin 13.7; Platelets 232; Potassium 3.7; Sodium 142   Lipid Panel     Component Value Date/Time   CHOL 86 07/31/2014 0736   TRIG 76.0 07/31/2014 0736   HDL 25.60* 07/31/2014 0736   CHOLHDL 3 07/31/2014 0736   VLDL 15.2 07/31/2014 0736   LDLCALC 45 07/31/2014 0736      Wt Readings from Last 3 Encounters:  02/19/15 157 lb 12.8 oz (71.578 kg)  11/24/14 154 lb 12.8 oz (70.217 kg)  10/01/14 155 lb (70.308 kg)     ASSESSMENT AND PLAN: 1. CAD, NATIVE VESSEL, WITH OLD MI: THE PATIENT HAS NEW SYMPTOMS OF EXERTIONAL DYSPNEA. I THINK HIS CHEST PAIN IS ATYPICAL. HOWEVER, WITH NEW ONSET EXERTIONAL DYSPNEA I HAVE RECOMMENDED AN EXERCISE STRESS ECHOCARDIOGRAM. IT IS POSSIBLE THAT BRILINTA IS CONTRIBUTING TO SHORTNESS OF BREATH, BUT THAT WOULD BE UNUSUAL AFTER HE HAS TOLERATED IT FOR THIS MANY MONTHS. I DON'T APPRECIATE ANY ABNORMALITY ON HIS EXAM. HE HAS CLEAR LUNG FIELDS AND NO EVIDENCE OF VOLUME EXCESS. WILL TOUCH BASE WITH HIM AGAIN AFTER THE RESULTS OF HIS STRESS TEST ARE AVAILABLE.  2. HIGH BLOOD PRESSURE: THE PATIENT HAS HAD LOW BLOOD PRESSURE IN THE  PAST. I THINK THERE IS AN ANXIETY COMPONENT TODAY. HE HAS BEEN VERY WORRIED ABOUT HIS SYMPTOMS. HE IS GOING TO CHECK BLOOD PRESSURES AT HOME AND REPORT THEM TO [place]. I DID NOT MAKE ANY MEDICATION CHANGES AS HE PREVIOUSLY HAS NOT TOLERATED EVEN LOW DOSES OF METOPROLOL AND LOSARTAN.  3. HYPERLIPIDEMIA: CONTINUES ON ATORVASTATIN AT HIGH DOSE.  Current medicines are reviewed with the patient today.  The patient does not have concerns regarding medicines.  Labs/ tests ordered today include:   Orders Placed This Encounter  Procedures  . EKG 12-Lead  . Echo stress    Disposition:   FU 4 months  Signed, Timothy Mocha, MD  02/21/2015 12:14 PM    [facility] [place], [place], [place]  [address] Phone: [phone];  Fax: [phone]

## 2015-02-21 ENCOUNTER — Encounter: Payer: Self-pay | Admitting: Cardiovascular Disease

## 2015-02-26 ENCOUNTER — Other Ambulatory Visit: Payer: Self-pay | Admitting: Cardiovascular Disease

## 2015-02-26 DIAGNOSIS — I2510 Atherosclerotic heart disease of native coronary artery without angina pectoris: Secondary | ICD-10-CM

## 2015-03-02 ENCOUNTER — Other Ambulatory Visit: Payer: Self-pay | Admitting: Cardiovascular Disease

## 2015-03-02 ENCOUNTER — Ambulatory Visit (HOSPITAL_COMMUNITY): Payer: BLUE CROSS/BLUE SHIELD | Attending: Cardiovascular Disease

## 2015-03-02 ENCOUNTER — Ambulatory Visit (HOSPITAL_BASED_OUTPATIENT_CLINIC_OR_DEPARTMENT_OTHER): Payer: BLUE CROSS/BLUE SHIELD

## 2015-03-02 DIAGNOSIS — R0602 Shortness of breath: Secondary | ICD-10-CM | POA: Insufficient documentation

## 2015-03-02 DIAGNOSIS — R079 Chest pain, unspecified: Secondary | ICD-10-CM

## 2015-03-02 MED ORDER — CARVEDILOL 3.125 MG PO TABS: 3.1250 mg | ORAL_TABLET | Freq: Two times a day (BID) | ORAL | Status: DC

## 2015-03-04 ENCOUNTER — Telehealth: Payer: Self-pay | Admitting: *Deleted

## 2015-03-04 NOTE — Telephone Encounter (Signed)
Called patient to inform of normal stress echo results. He wanted to make sure Dr. Burt Knack is aware that his BP was elevated during the study and that he was prescribed a medication. He is now taking coreg 3.125 twice daily  Also, he dropped off his recent BPs and was told they would be given to Dr. Burt Knack to review.   I advised to continue to monitor BP and call if systolic drops below 875 because he has been sensitive to BP medications in the past.   He also asked if I was sure the study was ok because "they acted like something was wrong while I was there".   I reassured him that Dr. Burt Knack has reviewed the report.  His SOB continues, unchanged, but overall he is feeling better because his BP is lower.  He has BP check scheduled with nurse on 04/01/15 which I advised him to keep.

## 2015-04-01 ENCOUNTER — Telehealth: Payer: Self-pay

## 2015-04-01 ENCOUNTER — Ambulatory Visit (INDEPENDENT_AMBULATORY_CARE_PROVIDER_SITE_OTHER): Payer: BLUE CROSS/BLUE SHIELD

## 2015-04-01 VITALS — BP 134/87 | HR 84 | Wt 156.0 lb

## 2015-04-01 DIAGNOSIS — Z136 Encounter for screening for cardiovascular disorders: Secondary | ICD-10-CM | POA: Diagnosis not present

## 2015-04-01 DIAGNOSIS — Z0130 Encounter for examination of blood pressure without abnormal findings: Secondary | ICD-10-CM

## 2015-04-01 NOTE — Patient Instructions (Signed)
Continue with current plan of care.

## 2015-04-01 NOTE — Telephone Encounter (Signed)
BP recheck.   Patient brought in blood pressure readings from home.  Copied for Dr. Antionette Char review. BP 134/87 HR 84 Weight 156.  Continues on Coreg 3.125 mg bid  Complains of an intermittent moist cough since he started on Coreg 03/03/15  Patient would like to know if he needs to continue Brilinta past January 2107.  He doesn't mind continuing if he needs to; just wonders if he as to.

## 2015-04-01 NOTE — Progress Notes (Signed)
BP recheck.   Patient brought in blood pressure readings from home.  Copied for Dr. Antionette Char review. BP 134/87 HR 84 Weight 156.  Complains of an intermittent moist cough since he started on Coreg 03/03/15  Patient would like to know if he needs to continue Brilinta past January 2107.  He doesn't mind continuing if he needs to; just wonders if he as to.

## 2015-04-03 NOTE — Telephone Encounter (Signed)
Pt can stop brilinta in January 2017 when he is one year out from his MI/PCI.

## 2015-04-05 NOTE — Telephone Encounter (Signed)
Dr Burt Knack reviewed the pt's BP readings and did not recommend any changes at this time. The pt can stop Brilinta in January 2017.

## 2015-04-05 NOTE — Telephone Encounter (Signed)
I spoke with the pt by phone and made him aware of this information.

## 2015-05-14 ENCOUNTER — Other Ambulatory Visit: Payer: Self-pay | Admitting: *Deleted

## 2015-05-14 ENCOUNTER — Other Ambulatory Visit: Payer: Self-pay

## 2015-05-14 MED ORDER — POTASSIUM CHLORIDE CRYS ER 20 MEQ PO TBCR: 20.0000 meq | EXTENDED_RELEASE_TABLET | Freq: Every day | ORAL | Status: DC

## 2015-05-14 MED ORDER — ATORVASTATIN CALCIUM 80 MG PO TABS: 80.0000 mg | ORAL_TABLET | Freq: Every day | ORAL | Status: DC

## 2015-05-14 NOTE — Telephone Encounter (Signed)
Okay to refill? 

## 2015-05-25 ENCOUNTER — Other Ambulatory Visit: Payer: Self-pay | Admitting: Cardiovascular Disease

## 2015-05-25 MED ORDER — CARVEDILOL 3.125 MG PO TABS: 3.1250 mg | ORAL_TABLET | Freq: Two times a day (BID) | ORAL | Status: DC

## 2015-06-02 ENCOUNTER — Encounter: Payer: Self-pay | Admitting: Cardiovascular Disease

## 2015-06-10 ENCOUNTER — Ambulatory Visit (INDEPENDENT_AMBULATORY_CARE_PROVIDER_SITE_OTHER): Payer: BLUE CROSS/BLUE SHIELD | Admitting: Cardiovascular Disease

## 2015-06-10 ENCOUNTER — Encounter: Payer: Self-pay | Admitting: Cardiovascular Disease

## 2015-06-10 VITALS — BP 138/82 | HR 70 | Ht 68.0 in | Wt 163.4 lb

## 2015-06-10 DIAGNOSIS — I252 Old myocardial infarction: Secondary | ICD-10-CM

## 2015-06-10 DIAGNOSIS — E785 Hyperlipidemia, unspecified: Secondary | ICD-10-CM

## 2015-06-10 NOTE — Patient Instructions (Signed)
Medication Instructions:  Your physician has recommended you make the following change in your medication:  1. STOP Brilinita  Labwork: Your physician recommends that you return for a FASTING LIPID and CMP in April--nothing to eat or drink after midnight, lab opens at 7:30 AM  Testing/Procedures: No new orders.   Follow-Up: Your physician wants you to follow-up in: 1 YEAR with Dr Burt Knack. You will receive a reminder letter in the mail two months in advance. If you don't receive a letter, please call our office to schedule the follow-up appointment.   Any Other Special Instructions Will Be Listed Below (If Applicable).     If you need a refill on your cardiac medications before your next appointment, please call your pharmacy.

## 2015-06-10 NOTE — Progress Notes (Signed)
Cardiology Office Note Date:  06/10/2015   ID:  Timothy Yoder, DOB 06/15/1955, MRN [id number]  PCP:  Madelyn Brunner, MD  Cardiologist:  Sherren Mocha, MD    Chief Complaint  Patient presents with  . Coronary Artery Disease    f/u  . Shortness of Breath    f/u    History of Present Illness: Timothy Yoder is a 60 y.o. male who presents for follow-up of coronary artery disease. The patient initially presented in January 2016 with an acute anterolateral STEMI. He was noted to have severe stenosis of the mid LAD and was treated with drug-eluting stent platform. His LVEF was normal at 60%. He had a moderate 70% lesion in the right coronary artery and underwent pressure wire analysis which was negative for ischemia. Ongoing medical therapy was recommended.  The patient is doing fairly well. He complains of easy bruising on aspirin and brilinta. He has had shortness of breath with activity. This was evaluated with a stress echocardiogram and this study was normal. The patient has had no recurrence of chest pain or pressure. He denies edema, orthopnea, or PND. He's undergone evaluation by his primary care physician and was diagnosed with asthma. He's been prescribed an inhaler.   Past Medical History  Diagnosis Date  . CAD (coronary artery disease)     a. 04/2014 anterolateral STEMI/PCI: LM nl, LAD 4m(2.75x16 Promus DES, D1 50p, LCX nl, RI nl, OM1 100 small/chronic, RCA dominant, 20-343m708m(FFR 0.90->Med Rx), RPDA 50, RPLA 70, EF 60%.  . Gastroesophageal reflux disease   . BPH (benign prostatic hyperplasia)   . HTN (hypertension)   . Hyperlipidemia   . Hypokalemia   . Former tobacco use     Past Surgical History  Procedure Laterality Date  . Left heart catheterization with coronary angiogram N/A 05/03/2014    Procedure: LEFT HEART CATHETERIZATION WITH CORONARY ANGIOGRAM;  Surgeon: MicBlane OharaD;  [facility];  Service: Cardiovascular;  Laterality: N/A;  .  Percutaneous coronary stent intervention (pci-s) N/A 05/05/2014    Procedure: PERCUTANEOUS CORONARY STENT INTERVENTION (PCI-S);  Surgeon: Peter M JorMartiniqueD;  [facility];  Service: Cardiovascular;  Laterality: N/A;    Current Outpatient Prescriptions  Medication Sig Dispense Refill  . albuterol (PROAIR HFA) 108 (90 Base) MCG/ACT inhaler Inhale 2 puffs into the lungs every 6 (six) hours as needed for wheezing or shortness of breath.     . aMarland Kitchenpirin 81 MG chewable tablet Chew 1 tablet (81 mg total) by mouth daily.    . aMarland Kitchenorvastatin (LIPITOR) 80 MG tablet Take 1 tablet (80 mg total) by mouth daily at 6 PM. 90 tablet 2  . carvedilol (COREG) 3.125 MG tablet Take 1 tablet (3.125 mg total) by mouth 2 (two) times daily. 180 tablet 2  . fluticasone furoate-vilanterol (BREO ELLIPTA) 200-25 MCG/INH AEPB Inhale 1 puff into the lungs daily.     . nitroGLYCERIN (NITROSTAT) 0.4 MG SL tablet Place 1 tablet (0.4 mg total) under the tongue every 5 (five) minutes x 3 doses as needed for chest pain. 25 tablet 3  . omeprazole (PRILOSEC) 40 MG capsule Take 40 mg by mouth daily.    . potassium chloride SA (K-DUR,KLOR-CON) 20 MEQ tablet Take 1 tablet (20 mEq total) by mouth daily. 90 tablet 0  . tamsulosin (FLOMAX) 0.4 MG CAPS capsule Take 0.4 mg by mouth daily after supper.     No current facility-administered medications for this visit.  Allergies:   Review of patient's allergies indicates no known allergies.   Social History:  The patient  reports that he has quit smoking. He has never used smokeless tobacco. He reports that he does not drink alcohol or use illicit drugs.   Family History:  The patient's family history includes CAD (age of onset: 42) in his father; CAD (age of onset: 45) in his mother.    ROS:  Please see the history of present illness.  Otherwise, review of systems is positive for exertional dyspnea, easy bruising.  All other systems are reviewed and negative.    PHYSICAL  EXAM: VS:  BP 138/82 mmHg  Pulse 70  Ht 5' 8"  (1.727 m)  Wt 74.118 kg (163 lb 6.4 oz)  BMI 24.85 kg/m2 , BMI Body mass index is 24.85 kg/(m^2). GEN: Well nourished, well developed, in no acute distress HEENT: normal Neck: no JVD, no masses. No carotid bruits Cardiac: RRR without murmur or gallop                Respiratory:  clear to auscultation bilaterally, normal work of breathing GI: soft, nontender, nondistended, + BS MS: no deformity or atrophy Ext: no pretibial edema, pedal pulses 2+= bilaterally Skin: warm and dry, no rash Neuro:  Strength and sensation are intact Psych: euthymic mood, full affect  EKG:  EKG is not ordered today.  Recent Labs: 07/31/2014: ALT 40 10/01/2014: BUN 9; Creatinine, Ser 0.97; Hemoglobin 13.7; Platelets 232; Potassium 3.7; Sodium 142   Lipid Panel     Component Value Date/Time   CHOL 86 07/31/2014 0736   TRIG 76.0 07/31/2014 0736   HDL 25.60* 07/31/2014 0736   CHOLHDL 3 07/31/2014 0736   VLDL 15.2 07/31/2014 0736   LDLCALC 45 07/31/2014 0736      Wt Readings from Last 3 Encounters:  06/10/15 74.118 kg (163 lb 6.4 oz)  04/01/15 70.761 kg (156 lb)  02/19/15 71.578 kg (157 lb 12.8 oz)     Cardiac Studies Reviewed: Exercise Stress Echo 03/02/2015: Study Conclusions  - Stress ECG conclusions: The stress ECG was normal. - Staged echo: Normal echo stress  Impressions:  - Normal study after maximal exercise. Low risk study.  ASSESSMENT AND PLAN: 1.  CAD, native vessel, with old MI: The patient has no symptoms of angina. His recent stress echocardiogram showed no evidence of ischemia. Recommended that he can stop brilinta. He should continue on aspirin 81 mg indefinitely. He will continue with his current risk reduction measures which included high-dose atorvastatin. I will see him back in one year.  2. Essential hypertension: Blood pressure is controlled on carvedilol.  3. Hyperlipidemia: Patient is treated with atorvastatin. Most recent  lipids reviewed. Will order repeat lipids and LFTs in about 3 months.  Current medicines are reviewed with the patient today.  The patient does not have concerns regarding medicines.  Labs/ tests ordered today include:   Orders Placed This Encounter  Procedures  . Lipid panel  . Comp Met (CMET)    Disposition:   FU one year  Signed, Sherren Mocha, MD  06/10/2015 8:58 AM    [facility] [place], [place], [place]  [address] Phone: [phone]; Fax: [phone]

## 2015-06-28 ENCOUNTER — Other Ambulatory Visit: Payer: Self-pay | Admitting: Nurse Practitioner

## 2015-07-22 ENCOUNTER — Other Ambulatory Visit: Payer: Self-pay | Admitting: Nurse Practitioner

## 2015-07-22 DIAGNOSIS — R748 Abnormal levels of other serum enzymes: Secondary | ICD-10-CM

## 2015-07-26 ENCOUNTER — Ambulatory Visit
Admission: RE | Admit: 2015-07-26 | Discharge: 2015-07-26 | Disposition: A | Discharge status: Home / Self Care | Payer: BLUE CROSS/BLUE SHIELD | Source: Ambulatory Visit | Attending: Nurse Practitioner | Admitting: Nurse Practitioner

## 2015-07-26 DIAGNOSIS — K8020 Calculus of gallbladder without cholecystitis without obstruction: Secondary | ICD-10-CM | POA: Insufficient documentation

## 2015-07-26 DIAGNOSIS — R748 Abnormal levels of other serum enzymes: Secondary | ICD-10-CM

## 2015-07-30 ENCOUNTER — Other Ambulatory Visit: Payer: Self-pay | Admitting: Nurse Practitioner

## 2015-07-30 DIAGNOSIS — R9389 Abnormal findings on diagnostic imaging of other specified body structures: Secondary | ICD-10-CM

## 2015-07-30 DIAGNOSIS — K8020 Calculus of gallbladder without cholecystitis without obstruction: Secondary | ICD-10-CM

## 2015-07-30 DIAGNOSIS — R1111 Vomiting without nausea: Secondary | ICD-10-CM

## 2015-07-30 DIAGNOSIS — R748 Abnormal levels of other serum enzymes: Secondary | ICD-10-CM

## 2015-08-02 ENCOUNTER — Encounter
Admission: RE | Disposition: A | Discharge status: Home / Self Care | Payer: Self-pay | Source: Ambulatory Visit | Attending: Unknown Physician Specialty

## 2015-08-02 ENCOUNTER — Ambulatory Visit: Payer: BLUE CROSS/BLUE SHIELD | Admitting: *Deleted

## 2015-08-02 ENCOUNTER — Other Ambulatory Visit: Payer: BLUE CROSS/BLUE SHIELD

## 2015-08-02 ENCOUNTER — Ambulatory Visit
Admission: RE | Admit: 2015-08-02 | Discharge: 2015-08-02 | Disposition: A | Discharge status: Home / Self Care | Payer: BLUE CROSS/BLUE SHIELD | Source: Ambulatory Visit | Attending: Unknown Physician Specialty | Admitting: Unknown Physician Specialty

## 2015-08-02 DIAGNOSIS — I252 Old myocardial infarction: Secondary | ICD-10-CM | POA: Insufficient documentation

## 2015-08-02 DIAGNOSIS — Z955 Presence of coronary angioplasty implant and graft: Secondary | ICD-10-CM | POA: Diagnosis not present

## 2015-08-02 DIAGNOSIS — K5730 Diverticulosis of large intestine without perforation or abscess without bleeding: Secondary | ICD-10-CM | POA: Insufficient documentation

## 2015-08-02 DIAGNOSIS — Z87891 Personal history of nicotine dependence: Secondary | ICD-10-CM | POA: Diagnosis not present

## 2015-08-02 DIAGNOSIS — Z7982 Long term (current) use of aspirin: Secondary | ICD-10-CM | POA: Insufficient documentation

## 2015-08-02 DIAGNOSIS — I10 Essential (primary) hypertension: Secondary | ICD-10-CM | POA: Insufficient documentation

## 2015-08-02 DIAGNOSIS — D123 Benign neoplasm of transverse colon: Secondary | ICD-10-CM | POA: Insufficient documentation

## 2015-08-02 DIAGNOSIS — I2510 Atherosclerotic heart disease of native coronary artery without angina pectoris: Secondary | ICD-10-CM | POA: Insufficient documentation

## 2015-08-02 DIAGNOSIS — E785 Hyperlipidemia, unspecified: Secondary | ICD-10-CM | POA: Diagnosis not present

## 2015-08-02 DIAGNOSIS — Z1211 Encounter for screening for malignant neoplasm of colon: Secondary | ICD-10-CM | POA: Diagnosis not present

## 2015-08-02 DIAGNOSIS — K222 Esophageal obstruction: Secondary | ICD-10-CM | POA: Insufficient documentation

## 2015-08-02 DIAGNOSIS — R112 Nausea with vomiting, unspecified: Secondary | ICD-10-CM | POA: Insufficient documentation

## 2015-08-02 DIAGNOSIS — K219 Gastro-esophageal reflux disease without esophagitis: Secondary | ICD-10-CM | POA: Diagnosis not present

## 2015-08-02 HISTORY — PX: COLONOSCOPY WITH PROPOFOL: SHX5780

## 2015-08-02 HISTORY — PX: ESOPHAGOGASTRODUODENOSCOPY (EGD) WITH PROPOFOL: SHX5813

## 2015-08-02 SURGERY — COLONOSCOPY WITH PROPOFOL
Anesthesia: General

## 2015-08-02 MED ORDER — LIDOCAINE HCL (CARDIAC) 20 MG/ML IV SOLN
INTRAVENOUS | Status: DC | PRN
  Administered 2015-08-02: 30 mg via INTRAVENOUS

## 2015-08-02 MED ORDER — PROPOFOL 500 MG/50ML IV EMUL
INTRAVENOUS | Status: DC | PRN
  Administered 2015-08-02: 160 ug/kg/min via INTRAVENOUS

## 2015-08-02 MED ORDER — SODIUM CHLORIDE 0.9 % IV SOLN: INTRAVENOUS | Status: DC

## 2015-08-02 MED ORDER — SODIUM CHLORIDE 0.9 % IV SOLN
INTRAVENOUS | Status: DC
  Administered 2015-08-02: 13:00:00 via INTRAVENOUS

## 2015-08-02 MED ORDER — PROPOFOL 10 MG/ML IV BOLUS
INTRAVENOUS | Status: DC | PRN
  Administered 2015-08-02 (×2): 50 mg via INTRAVENOUS

## 2015-08-02 NOTE — H&P (Signed)
Primary Care Physician:  Madelyn Brunner, MD Primary Gastroenterologist:  Dr. Vira Agar  Pre-Procedure History & Physical: HPI:  Timothy Yoder is a 60 y.o. male is here for an endoscopy and colonoscopy.   Past Medical History  Diagnosis Date  . CAD (coronary artery disease)     a. 04/2014 anterolateral STEMI/PCI: LM nl, LAD 92m(2.75x16 Promus DES, D1 50p, LCX nl, RI nl, OM1 100 small/chronic, RCA dominant, 20-350m7035m(FFR 0.90->Med Rx), RPDA 50, RPLA 70, EF 60%.  . Gastroesophageal reflux disease   . BPH (benign prostatic hyperplasia)   . HTN (hypertension)   . Hyperlipidemia   . Hypokalemia   . Former tobacco use     Past Surgical History  Procedure Laterality Date  . Left heart catheterization with coronary angiogram N/A 05/03/2014    Procedure: LEFT HEART CATHETERIZATION WITH CORONARY ANGIOGRAM;  Surgeon: MicBlane OharaD;  [facility];  Service: Cardiovascular;  Laterality: N/A;  . Percutaneous coronary stent intervention (pci-s) N/A 05/05/2014    Procedure: PERCUTANEOUS CORONARY STENT INTERVENTION (PCI-S);  Surgeon: Peter M JorMartiniqueD;  [facility];  Service: Cardiovascular;  Laterality: N/A;    Prior to Admission medications   Medication Sig Start Date End Date Taking? Authorizing Provider  albuterol (PROAIR HFA) 108 (90 Base) MCG/ACT inhaler Inhale 2 puffs into the lungs every 6 (six) hours as needed for wheezing or shortness of breath.  06/04/15  Yes Historical Provider, MD  aspirin 81 MG chewable tablet Chew 1 tablet (81 mg total) by mouth daily. 05/06/14  Yes ChrRogelia MireP  atorvastatin (LIPITOR) 80 MG tablet Take 1 tablet (80 mg total) by mouth daily at 6 PM. 05/14/15  Yes MicSherren MochaD  carvedilol (COREG) 3.125 MG tablet Take 1 tablet (3.125 mg total) by mouth 2 (two) times daily. 05/25/15  Yes MicSherren MochaD  fluticasone furoate-vilanterol (BREO ELLIPTA) 200-25 MCG/INH AEPB Inhale 1 puff into the lungs daily.  06/02/15  Yes  Historical Provider, MD  omeprazole (PRILOSEC) 40 MG capsule Take 40 mg by mouth daily.   Yes Historical Provider, MD  potassium chloride SA (K-DUR,KLOR-CON) 20 MEQ tablet Take 1 tablet (20 mEq total) by mouth daily. 05/14/15  Yes MicSherren MochaD  tamsulosin (FLOMAX) 0.4 MG CAPS capsule Take 0.4 mg by mouth daily after supper.   Yes Historical Provider, MD  nitroGLYCERIN (NITROSTAT) 0.4 MG SL tablet Place 1 tablet (0.4 mg total) under the tongue every 5 (five) minutes x 3 doses as needed for chest pain. Patient not taking: Reported on 08/02/2015 11/24/14   LorBurtis JunesP    Allergies as of 07/24/2015  . (No Known Allergies)    Family History  Problem Relation Age of Onset  . CAD Father 40 92 Died of a massive MI  . CAD Mother 70 15 Died of an MI    Social History   Social History  . Marital Status: Married    Spouse Name: N/A  . Number of Children: N/A  . Years of Education: N/A   Occupational History  . Not on file.   Social History Main Topics  . 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 Alcohol Use: No  . Drug Use: No  . Sexual Activity: Not on file   Other Topics Concern  . Not on file   Social History Narrative   The patient is married. [place] in [place]  works full-time. He quit smoking in 2013.    Review of Systems: See HPI, otherwise negative ROS  Physical Exam: BP 125/88 mmHg  Pulse 83  Temp(Src) 96.9 F (36.1 C) (Tympanic)  Resp 18  Ht '5\' 9"'$  (1.753 m)  Wt 72.576 kg (160 lb)  BMI 23.62 kg/m2  SpO2 98% General:   Alert,  pleasant and cooperative in NAD Head:  Normocephalic and atraumatic. Neck:  Supple; no masses or thyromegaly. Lungs:  Clear throughout to auscultation.    Heart:  Regular rate and rhythm. Abdomen:  Soft, nontender and nondistended. Normal bowel sounds, without guarding, and without rebound.   Neurologic:  Alert and  oriented x4;  grossly normal neurologically.  Impression/Plan: Timothy Yoder is here for an endoscopy and colonoscopy to be performed for [facility] colon polyps and nausea and vomiting  Risks, benefits, limitations, and alternatives regarding  endoscopy and colonoscopy have been reviewed with the patient.  Questions have been answered.  All parties agreeable.   Gaylyn Cheers, MD  08/02/2015, 3:23 PM

## 2015-08-02 NOTE — Anesthesia Preprocedure Evaluation (Addendum)
Anesthesia Evaluation  Patient identified by MRN, date of birth, ID band Patient awake    Reviewed: Allergy & Precautions, NPO status , Patient's Chart, lab work & pertinent test results  Airway Mallampati: III  TM Distance: >3 FB Neck ROM: Full    Dental  (+) Upper Dentures   Pulmonary former smoker,  Quit 4 1/2 years ago.   Pulmonary exam normal        Cardiovascular Exercise Tolerance: Poor hypertension, Pt. on medications and Pt. on home beta blockers + CAD, + Past MI and + Cardiac Stents  Normal cardiovascular exam  His exercise tolerance was good post MI and Stent until he developed asthma. Now he finds going up stairs not as easy as before.   Neuro/Psych    GI/Hepatic GERD  Medicated and Controlled,  Endo/Other    Renal/GU      Musculoskeletal   Abdominal Normal abdominal exam  (+)   Peds  Hematology   Anesthesia Other Findings   Reproductive/Obstetrics                            Anesthesia Physical Anesthesia Plan  ASA: III  Anesthesia Plan: General   Post-op Pain Management:    Induction: Intravenous  Airway Management Planned: Nasal Cannula  Additional Equipment:   Intra-op Plan:   Post-operative Plan:   Informed Consent: I have reviewed the patients History and Physical, chart, labs and discussed the procedure including the risks, benefits and alternatives for the proposed anesthesia with the patient or authorized representative who has indicated his/her understanding and acceptance.     Plan Discussed with: CRNA  Anesthesia Plan Comments:         Anesthesia Quick Evaluation

## 2015-08-02 NOTE — Transfer of Care (Signed)
Immediate Anesthesia Transfer of Care Note  Patient: Timothy Yoder  Procedure(s) Performed: Procedure(s): COLONOSCOPY WITH PROPOFOL (N/A) ESOPHAGOGASTRODUODENOSCOPY (EGD) WITH PROPOFOL (N/A)  Patient Location: Endoscopy Unit  Anesthesia Type:General  Level of Consciousness: awake, alert , oriented and patient cooperative  Airway & Oxygen Therapy: Patient Spontanous Breathing and Patient connected to nasal cannula oxygen  Post-op Assessment: Report given to RN, Post -op Vital signs reviewed and stable and Patient moving all extremities X 4  Post vital signs: Reviewed and stable  Last Vitals:  Filed Vitals:   08/02/15 1243  BP: 145/87  Pulse: 87  Temp: 36.5 C  Resp: 20    Complications: No apparent anesthesia complications

## 2015-08-02 NOTE — Op Note (Signed)
[facility] Gastroenterology Patient Name: Timothy Yoder Procedure Date: 08/02/2015 2:30 PM MRN: [id number] Account #: [id number] Date of Birth: [birth year] Admit Type: Outpatient Age: 60 Room: [facility] ENDO ROOM 1 Gender: Male Note Status: Finalized Procedure:            Colonoscopy Indications:          High risk colon cancer surveillance: Personal history                        of colonic polyps Providers:            Manya Silvas, MD Referring MD:         Hewitt Blade. Sarina Ser, MD (Referring MD) Medicines:            Propofol per Anesthesia Complications:        No immediate complications. Procedure:            Pre-Anesthesia Assessment:                       - After reviewing the risks and benefits, the patient                        was deemed in satisfactory condition to undergo the                        procedure.                       After obtaining informed consent, the colonoscope was                        passed under direct vision. Throughout the procedure,                        the patient's blood pressure, pulse, and oxygen                        saturations were monitored continuously. The                        Colonoscope was introduced through the anus and                        advanced to the the cecum, identified by appendiceal                        orifice and ileocecal valve. The colonoscopy was                        performed without difficulty. The patient tolerated the                        procedure well. The quality of the bowel preparation                        was good. Findings:      A small polyp was found in the transverse colon. The polyp was sessile.       The polyp was removed with a hot snare. Resection and retrieval were       complete. To prevent bleeding after the polypectomy, one hemostatic clip  was successfully placed. There was no bleeding during, or at the end, of       the procedure.      Multiple  small-mouthed diverticula were found in the sigmoid colon,       descending colon and transverse colon.      The exam was otherwise without abnormality. Impression:           - One small polyp in the transverse colon, removed with                        a hot snare. Resected and retrieved. Clip was placed.                       - Diverticulosis in the sigmoid colon, in the                        descending colon and in the transverse colon.                       - The examination was otherwise normal. Recommendation:       - Await pathology results. Manya Silvas, MD 08/02/2015 3:06:16 PM This report has been signed electronically. Number of Addenda: 0 Note Initiated On: 08/02/2015 2:30 PM Scope Withdrawal Time: 0 hours 10 minutes 23 seconds  Total Procedure Duration: 0 hours 14 minutes 14 seconds       [facility]

## 2015-08-02 NOTE — Op Note (Signed)
[facility] Gastroenterology Patient Name: Timothy Yoder Procedure Date: 08/02/2015 2:30 PM MRN: [id number] Account #: [id number] Date of Birth: 1955-09-11 Admit Type: Outpatient Age: 60 Room: [facility] ENDO ROOM 1 Gender: Male Note Status: Finalized Procedure:            Upper GI endoscopy Indications:          Nausea with vomiting Providers:            Manya Silvas, MD Referring MD:         Hewitt Blade. Sarina Ser, MD (Referring MD) Medicines:            Propofol per Anesthesia Complications:        No immediate complications. Procedure:            Pre-Anesthesia Assessment:                       - After reviewing the risks and benefits, the patient                        was deemed in satisfactory condition to undergo the                        procedure.                       After obtaining informed consent, the endoscope was                        passed under direct vision. Throughout the procedure,                        the patient's blood pressure, pulse, and oxygen                        saturations were monitored continuously. The                        Colonoscope was introduced through the mouth, and                        advanced to the second part of duodenum. The upper GI                        endoscopy was accomplished without difficulty. The                        patient tolerated the procedure well. Findings:      A mild Schatzki ring (acquired) was found at the gastroesophageal       junction.      The stomach was normal.      The examined duodenum was normal. Impression:           - Mild Schatzki ring.                       - Normal stomach.                       - Normal examined duodenum.                       - No specimens collected. Recommendation:       -  Perform a colonoscopy as previously scheduled. Manya Silvas, MD 08/02/2015 2:47:35 PM This report has been signed electronically. Number of Addenda: 0 Note Initiated On:  08/02/2015 2:30 PM      [facility]

## 2015-08-02 NOTE — Anesthesia Postprocedure Evaluation (Signed)
Anesthesia Post Note  Patient: KAEVON COTTA  Procedure(s) Performed: Procedure(s) (LRB): COLONOSCOPY WITH PROPOFOL (N/A) ESOPHAGOGASTRODUODENOSCOPY (EGD) WITH PROPOFOL (N/A)  Patient location during evaluation: Endoscopy Anesthesia Type: General Level of consciousness: awake and alert Pain management: pain level controlled Vital Signs Assessment: post-procedure vital signs reviewed and stable Respiratory status: spontaneous breathing, nonlabored ventilation, respiratory function stable and patient connected to nasal cannula oxygen Cardiovascular status: blood pressure returned to baseline and stable Postop Assessment: no signs of nausea or vomiting Anesthetic complications: no    Last Vitals:  Filed Vitals:   08/02/15 1530 08/02/15 1540  BP: 140/94 154/85  Pulse: 67 66  Temp:    Resp: 17 15    Last Pain:  Filed Vitals:   08/02/15 1540  PainSc: Asleep                 Olamide Carattini S

## 2015-08-03 ENCOUNTER — Ambulatory Visit
Admission: RE | Admit: 2015-08-03 | Discharge: 2015-08-03 | Disposition: A | Discharge status: Home / Self Care | Payer: BLUE CROSS/BLUE SHIELD | Source: Ambulatory Visit | Attending: Nurse Practitioner | Admitting: Nurse Practitioner

## 2015-08-03 DIAGNOSIS — K5730 Diverticulosis of large intestine without perforation or abscess without bleeding: Secondary | ICD-10-CM | POA: Insufficient documentation

## 2015-08-03 DIAGNOSIS — I2510 Atherosclerotic heart disease of native coronary artery without angina pectoris: Secondary | ICD-10-CM | POA: Insufficient documentation

## 2015-08-03 DIAGNOSIS — R9389 Abnormal findings on diagnostic imaging of other specified body structures: Secondary | ICD-10-CM

## 2015-08-03 DIAGNOSIS — M4317 Spondylolisthesis, lumbosacral region: Secondary | ICD-10-CM | POA: Insufficient documentation

## 2015-08-03 DIAGNOSIS — R935 Abnormal findings on diagnostic imaging of other abdominal regions, including retroperitoneum: Secondary | ICD-10-CM | POA: Diagnosis present

## 2015-08-03 DIAGNOSIS — R110 Nausea: Secondary | ICD-10-CM | POA: Insufficient documentation

## 2015-08-03 DIAGNOSIS — R748 Abnormal levels of other serum enzymes: Secondary | ICD-10-CM | POA: Diagnosis not present

## 2015-08-03 DIAGNOSIS — K8020 Calculus of gallbladder without cholecystitis without obstruction: Secondary | ICD-10-CM | POA: Insufficient documentation

## 2015-08-03 DIAGNOSIS — R1111 Vomiting without nausea: Secondary | ICD-10-CM

## 2015-08-03 HISTORY — DX: Presence of coronary angioplasty implant and graft: Z95.5

## 2015-08-03 MED ORDER — IOPAMIDOL (ISOVUE-300) INJECTION 61%
100.0000 mL | Freq: Once | INTRAVENOUS | Status: AC | PRN
  Administered 2015-08-03: 100 mL via INTRAVENOUS

## 2015-08-04 LAB — SURGICAL PATHOLOGY

## 2015-08-06 ENCOUNTER — Other Ambulatory Visit: Payer: BLUE CROSS/BLUE SHIELD

## 2015-08-10 ENCOUNTER — Other Ambulatory Visit: Payer: Self-pay

## 2015-08-10 ENCOUNTER — Other Ambulatory Visit: Payer: BLUE CROSS/BLUE SHIELD

## 2015-08-10 DIAGNOSIS — I2510 Atherosclerotic heart disease of native coronary artery without angina pectoris: Secondary | ICD-10-CM

## 2015-08-10 DIAGNOSIS — Z9861 Coronary angioplasty status: Principal | ICD-10-CM

## 2015-08-12 ENCOUNTER — Other Ambulatory Visit (INDEPENDENT_AMBULATORY_CARE_PROVIDER_SITE_OTHER): Payer: BLUE CROSS/BLUE SHIELD | Admitting: *Deleted

## 2015-08-12 DIAGNOSIS — Z9861 Coronary angioplasty status: Secondary | ICD-10-CM

## 2015-08-12 DIAGNOSIS — I2510 Atherosclerotic heart disease of native coronary artery without angina pectoris: Secondary | ICD-10-CM | POA: Diagnosis not present

## 2015-08-13 LAB — LIPID PANEL
CHOLESTEROL TOTAL: 104 mg/dL (ref 100–199)
Chol/HDL Ratio: 3 ratio units (ref 0.0–5.0)
HDL: 35 mg/dL — AB (ref >39)
LDL Calculated: 58 mg/dL (ref 0–99)
TRIGLYCERIDES: 55 mg/dL (ref 0–149)
VLDL Cholesterol Cal: 11 mg/dL (ref 5–40)

## 2015-08-13 LAB — COMPREHENSIVE METABOLIC PANEL
A/G RATIO: 1.6 (ref 1.2–2.2)
ALT: 33 IU/L (ref 0–44)
AST: 37 IU/L (ref 0–40)
Albumin: 4 g/dL (ref 3.6–4.8)
Alkaline Phosphatase: 160 IU/L — ABNORMAL HIGH (ref 39–117)
BILIRUBIN TOTAL: 0.4 mg/dL (ref 0.0–1.2)
BUN / CREAT RATIO: 9 — AB (ref 10–24)
BUN: 10 mg/dL (ref 8–27)
CO2: 25 mmol/L (ref 18–29)
Calcium: 8.8 mg/dL (ref 8.6–10.2)
Chloride: 102 mmol/L (ref 96–106)
Creatinine, Ser: 1.11 mg/dL (ref 0.76–1.27)
GFR, EST AFRICAN AMERICAN: 82 mL/min/{1.73_m2} (ref >59)
GFR, EST NON AFRICAN AMERICAN: 71 mL/min/{1.73_m2} (ref >59)
GLOBULIN, TOTAL: 2.5 g/dL (ref 1.5–4.5)
Glucose: 93 mg/dL (ref 65–99)
POTASSIUM: 4.8 mmol/L (ref 3.5–5.2)
SODIUM: 140 mmol/L (ref 134–144)
TOTAL PROTEIN: 6.5 g/dL (ref 6.0–8.5)

## 2015-10-28 ENCOUNTER — Other Ambulatory Visit: Payer: Self-pay

## 2015-10-28 ENCOUNTER — Other Ambulatory Visit: Payer: Self-pay | Admitting: Cardiovascular Disease

## 2015-10-28 MED ORDER — POTASSIUM CHLORIDE CRYS ER 20 MEQ PO TBCR: 20.0000 meq | EXTENDED_RELEASE_TABLET | Freq: Every day | ORAL | Status: DC

## 2016-01-03 IMAGING — CR DG CHEST 1V PORT SAME DAY
1 series · 1 of 1 positions shown · non-contrast
Comparison: None.

CLINICAL DATA: Myocardial infarction. Congestive heart failure
yesterday. Feeling better today.

EXAM:
PORTABLE CHEST - 1 VIEW SAME DAY

[portable]
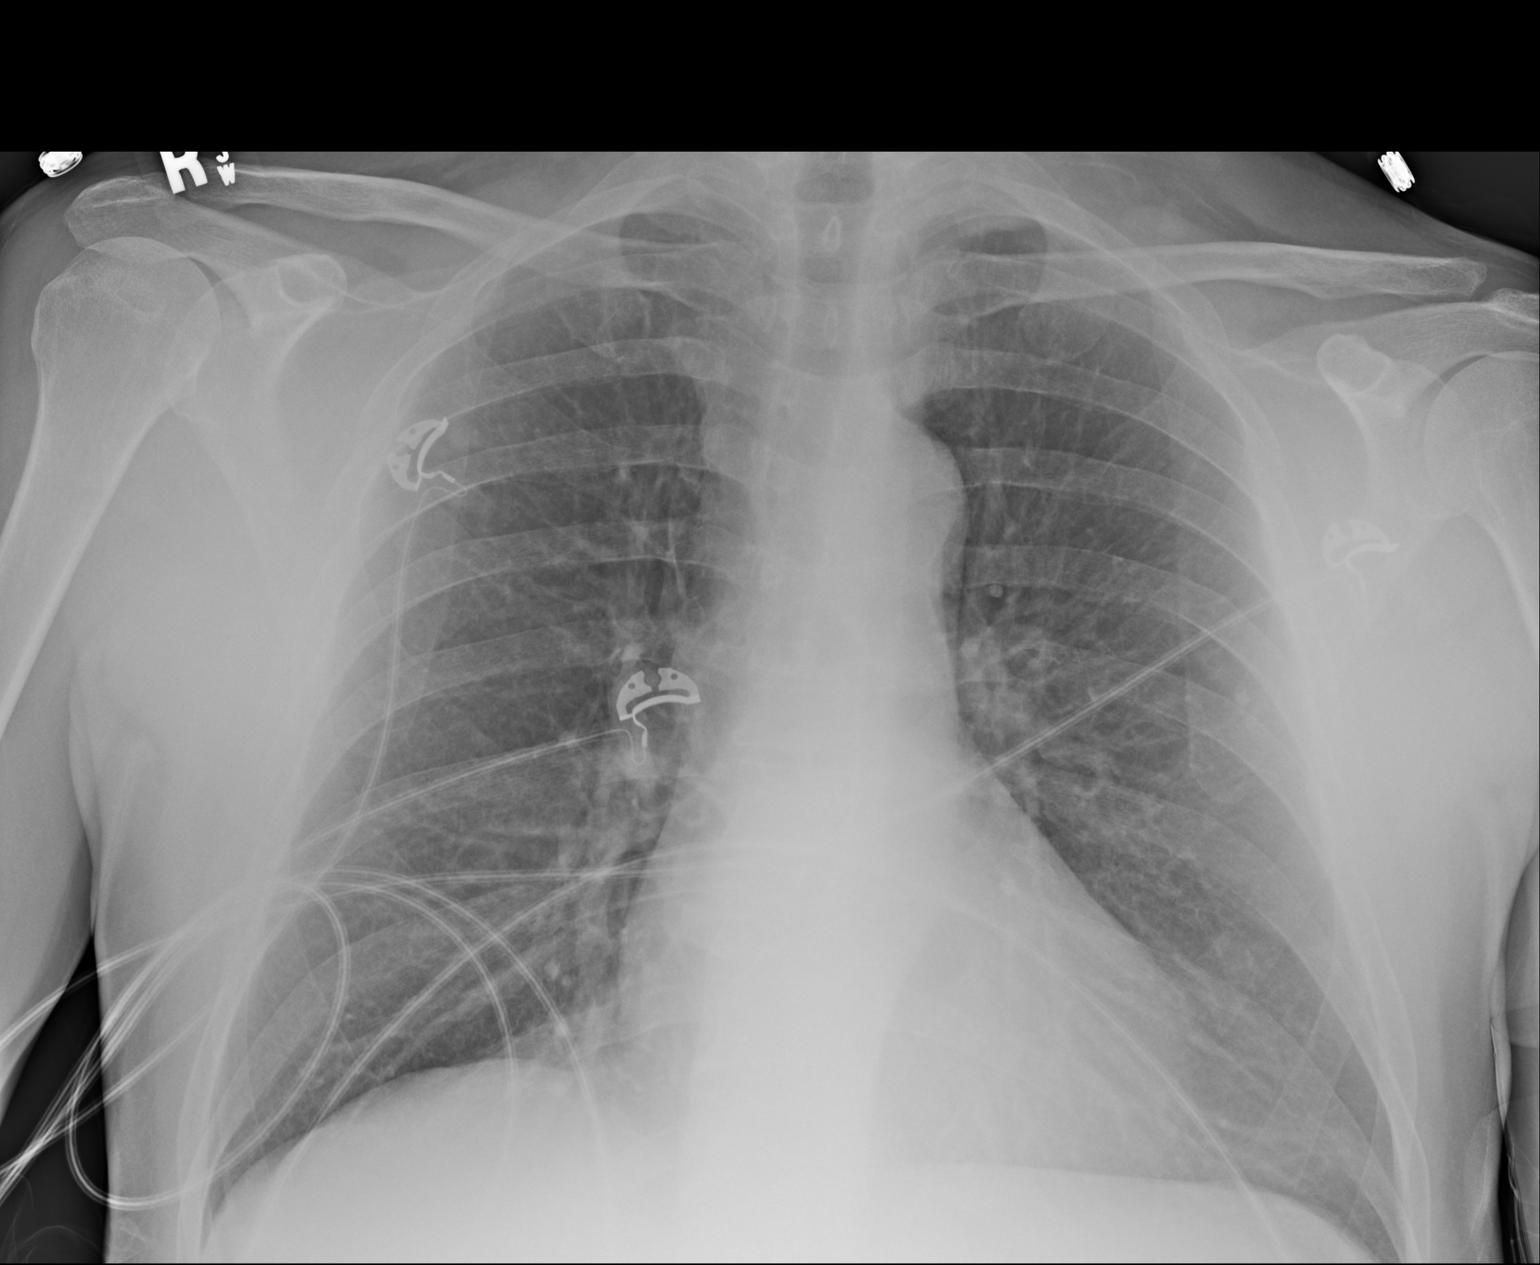

[1 of 1 positions shown; findings below may reference images not displayed]

FINDINGS: The cardiac silhouette, mediastinal and hilar contours are within
normal limits. The lungs are clear. No pulmonary edema or pleural
effusions. The bony thorax is intact.
IMPRESSION: No acute cardiopulmonary findings.

## 2016-01-17 ENCOUNTER — Other Ambulatory Visit: Payer: Self-pay | Admitting: Nurse Practitioner

## 2016-01-17 DIAGNOSIS — K8020 Calculus of gallbladder without cholecystitis without obstruction: Secondary | ICD-10-CM

## 2016-01-17 DIAGNOSIS — R1011 Right upper quadrant pain: Secondary | ICD-10-CM

## 2016-01-20 ENCOUNTER — Ambulatory Visit
Admission: RE | Admit: 2016-01-20 | Discharge: 2016-01-20 | Disposition: A | Discharge status: Home / Self Care | Payer: BLUE CROSS/BLUE SHIELD | Source: Ambulatory Visit | Attending: Nurse Practitioner | Admitting: Nurse Practitioner

## 2016-01-20 DIAGNOSIS — K8020 Calculus of gallbladder without cholecystitis without obstruction: Secondary | ICD-10-CM | POA: Insufficient documentation

## 2016-01-20 DIAGNOSIS — R1011 Right upper quadrant pain: Secondary | ICD-10-CM | POA: Diagnosis not present

## 2016-02-07 ENCOUNTER — Encounter: Payer: Self-pay | Admitting: Physician Assistant

## 2016-02-07 NOTE — Progress Notes (Signed)
Cardiology Office Note    Date:  02/08/2016   ID:  Timothy Yoder, DOB 08/24/1955, MRN [id number]  PCP:  Madelyn Brunner, MD  Cardiologist: Dr.Cooper  Chief Complaint  Patient presents with  . Pre-op Exam    cholecystecyomy    History of Present Illness:  Timothy Yoder is a 60 y.o. male with history of CAD S/P anterolateral STEMI 04/2014 treated with DES mid LAD LVEF 60%, moderate 70 RCA pressure wire analysis was negative for ischemia.  Last saw Dr. Burt Knack 06/10/15 and complained of some DOE. Stress echo was normal 02/2015.  Also diagnosed with asthma. Has history of HTN, HLD, GERD.Brillinta was stopped.   Complains of very hard, sharp chest pain that lasts few minutes. Walks around and eases but then pain returns.No radiation to neck or arm, dyspnea, diaphoresis, no nausea. Not related to meals.Occurs anytime of day.Stopped walking about 3 months ago because of problems with COPD. Can walk 2 or 3 miles on flat surface without trouble. Becomes short Of breath on any incline. He had similar pain when he had his MI so he is worried that it's not just his gallbladder. When he had his MI also had a heavy pressure associated with a sharp pain. He is not having any heavy chest pressure or tightness and no exertional symptoms. Blood pressure is very high today. He has been eating a lot of fast food and salt.    Past Medical History:  Diagnosis Date  . Asthma   . BPH (benign prostatic hyperplasia)   . CAD (coronary artery disease)    a. 04/2014 anterolateral STEMI/PCI: LM nl, LAD 45m(2.75x16 Promus DES, D1 50p, LCX nl, RI nl, OM1 100 small/chronic, RCA dominant, [id number](FFR 0.90->Med Rx), RPDA 50, RPLA 70, EF 60%.  . Former tobacco use   . Gastroesophageal reflux disease   . HTN (hypertension)   . Hyperlipidemia   . Hypokalemia   . Stented coronary artery     Past Surgical History:  Procedure Laterality Date  . COLONOSCOPY WITH PROPOFOL [place] 08/02/2015   Procedure:  COLONOSCOPY WITH PROPOFOL;  Surgeon: RobManya SilvasD;  [facility];  Service: Endoscopy;  Laterality: [place] Y[place];  . ESOPHAGOGASTRODUODENOSCOPY (EGD) WITH PROPOFOL [place] Y[place] 08/02/2015   Procedure: ESOPHAGOGASTRODUODENOSCOPY (EGD) WITH PROPOFOL;  Surgeon: RobManya SilvasD;  [facility];  Service: Endoscopy;  Laterality: [place];  . LEFT HEART CATHETERIZATION WITH CORONARY ANGIOGRAM [place] 05/03/2014   Procedure: LEFT HEART CATHETERIZATION WITH CORONARY ANGIOGRAM;  Surgeon: MicBlane OharaD;  [facility];  Service: Cardiovascular;  Laterality: [place];  . PERCUTANEOUS CORONARY STENT INTERVENTION (PCI-S) [place] Y[place] 05/05/2014   Procedure: PERCUTANEOUS CORONARY STENT INTERVENTION (PCI-S);  Surgeon: Peter M JorMartiniqueD;  [facility];  Service: Cardiovascular;  Laterality: [place];    Current Medications: Outpatient Medications Prior to Visit  Medication Sig Dispense Refill  . albuterol (PROAIR HFA) 108 (90 Base) MCG/ACT inhaler Inhale 2 puffs into the lungs every 6 (six) hours as needed for wheezing or shortness of breath.     . aMarland Kitchenpirin 81 MG chewable tablet Chew 1 tablet (81 mg total) by mouth daily.    . carvedilol (COREG) 3.125 MG tablet Take 1 tablet (3.125 mg total) by mouth 2 (two) times daily. 180 tablet 2  . fluticasone furoate-vilanterol (BREO ELLIPTA) 200-25 MCG/INH AEPB Inhale 1 puff into the lungs daily.     . oMarland Kitcheneprazole (PRILOSEC) 40 MG capsule Take 40 mg by mouth daily.    .Marland Kitchen  tamsulosin (FLOMAX) 0.4 MG CAPS capsule Take 0.4 mg by mouth daily after supper.    Marland Kitchen atorvastatin (LIPITOR) 80 MG tablet Take 1 tablet (80 mg total) by mouth daily at 6 PM. 90 tablet 2  . potassium chloride SA (K-DUR,KLOR-CON) 20 MEQ tablet Take 1 tablet (20 mEq total) by mouth daily. 90 tablet 3  . nitroGLYCERIN (NITROSTAT) 0.4 MG SL tablet Place 1 tablet (0.4 mg total) under the tongue every 5 (five) minutes x 3 doses as needed for chest pain. (Patient not taking: Reported on 08/02/2015) 25  tablet 3   No facility-administered medications prior to visit.      Allergies:   Review of patient's allergies indicates no known allergies.   Social History   Social History  . Marital status: Married    Spouse name: Timothy Yoder  . Number of children: [place] Yoder  . Years of education: [place]   Social History Main Topics  . Smoking status: Former Research scientist (life sciences)  . Smokeless tobacco: Never Used  . Alcohol use No  . Drug use: No  . Sexual activity: Not Asked   Other Topics Concern  . None   Social History Narrative   The patient is married. He lives in [place] He works full-time. He quit smoking in 2013.     Family History:  The patient'sfamily history includes CAD (age of onset: 38) in his father; CAD (age of onset: 60) in his mother.   ROS:   Please see the history of present illness.    Review of Systems  Constitution: Negative.  HENT: Negative.   Cardiovascular: Positive for chest pain and dyspnea on exertion.  Respiratory: Negative.   Endocrine: Negative.   Hematologic/Lymphatic: Negative.   Musculoskeletal: Negative.   Genitourinary: Negative.   Neurological: Negative.    All other systems reviewed and are negative.   PHYSICAL EXAM:   VS:  BP (!) 180/100   Pulse (!) 59   Ht '5\' 9"'$  (1.753 m)   Wt 170 lb (77.1 kg)   BMI 25.10 kg/m   Physical Exam  GEN: Well nourished, well developed, in no acute distress  HEENT: normal  Neck: no JVD, carotid bruits, or masses Cardiac:RRR; As of S4, no murmurs, rubs Respiratory:  clear to auscultation bilaterally, normal work of breathing, no wheezing GI: soft, nontender, nondistended, + BS Ext: without cyanosis, clubbing, or edema, Good distal pulses bilaterally MS: no deformity or atrophy  Skin: warm and dry, no rash Neuro:  Alert and Oriented x 3, Strength and sensation are intact Psych: euthymic mood, full affect  Wt Readings from Last 3 Encounters:  02/08/16 170 lb (77.1 kg)  08/02/15 160 lb (72.6 kg)  06/10/15 163 lb 6.4 oz (74.1  kg)      Studies/Labs Reviewed:   EKG:  EKG is  ordered today.  The ekg ordered today demonstrates  Sinus bradycardia 59 bpm otherwise normal  Recent Labs: 08/12/2015: ALT 33; BUN 10; Creatinine, Ser 1.11; Potassium 4.8; Sodium 140   Lipid Panel    Component Value Date/Time   CHOL 104 08/12/2015 0827   TRIG 55 08/12/2015 0827   HDL 35 (L) 08/12/2015 0827   CHOLHDL 3.0 08/12/2015 0827   CHOLHDL 3 07/31/2014 0736   VLDL 15.2 07/31/2014 0736   LDLCALC 58 08/12/2015 0827    Additional studies/ records that were reviewed today include:  Exercise Stress Echo 03/02/2015: Study Conclusions  - Stress ECG conclusions: The stress ECG was normal. - Staged echo: Normal echo stress  Impressions:  -  Normal study after maximal exercise. Low risk study.  Cardiac cath: Final Conclusions:   1. Acute anterior wall MI secondary to severe LAD stenosis. Suspect reperfusion occurred after heparin and brilinta was administered. 2. 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 3. 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 4. Mild contraction abnormality of the left ventricle with preserved overall LV function.    Recommendations:  Dual antiplatelet therapy with aspirin and brilinta for at least 12 months. Post MI medical therapy. Consider staged PCI +/- FFR of the mid-RCA. Complete Trial would be a reasonable consideration.   Sherren Mocha MD, [facility] 05/03/2014, 5:06 PM     Indication: 60 yo WM presented with an anterior MI treated with stenting of the LAD. There was an eccentric 70% lesion in the mid RCA. FFR planned to decide functional significance.   Procedural Details: The right wrist was prepped, draped, and anesthetized with 1% lidocaine. Using the modified Seldinger technique, a 6 Fr slender sheath was introduced into the radial artery. 3 mg verapamil was administered through the radial sheath. Weight-based  heparin was given for anticoagulation. Once a therapeutic ACT was achieved, a 6 [place] FR4 guide catheter was inserted.  A Flow wire was used to cross the lesion.  The patient was given IV Adenosine infusion per protocol for maximal hyperemia. FFR was measured at 0.90. This indicates that the lesion is not hemodynamically significant. The patient tolerated the procedure well. There were no immediate procedural complications. A TR band was used for radial hemostasis. The patient was transferred to the post catheterization recovery area for further monitoring.   Conclusions: FFR of 0.90 of the mid RCA   Recommendations: medical management.   Peter Martinique, [place] 05/05/2014, 4:22 PM        ASSESSMENT:    1. Preoperative clearance   2. Precordial pain   3. CAD S/P percutaneous coronary angioplasty   4. Essential hypertension   5. Hyperlipidemia, unspecified hyperlipidemia type   6. Chronic obstructive pulmonary disease, unspecified COPD type ([place])      PLAN:  In order of problems listed above:  Preoperative clearance for cholecystectomy to be done at [facility]. Patient has had 3 weeks of chest pain that may be related to his gallbladder but also concerns for possible angina. Recommend exercise Myoview prior to surgery.Discussed with Dr. Burt Knack who concurs.  CAD status post DES to the LAD 07/2014 with residual 70% RCA with an FFR of 0.90 medical treatment recommended. Normal stress echo 02/2015. Now with recurrent chest pain. Recommend stress Myoview  Essential hypertension blood pressure is quite high today. I can't increase his Coreg because of bradycardia. Add lisinopril 10 mg once daily. 2 g sodium diet.  Hyperlipidemia well controlled on Lipitor. Lipids done in April 2017 and were stable  COPD mange with albuterol no wheezing on exam today.     Medication Adjustments/Labs and Tests Ordered: Current medicines are reviewed at length with the patient today.   Concerns regarding medicines are outlined above.  Medication changes, Labs and Tests ordered today are listed in the Patient Instructions below. Patient Instructions  Medication Instructions:   START LISINOPRIL 10 MG ONCE DAILY  Testing/Procedures:  Your physician has requested that you have en exercise stress myoview. For further information please visit [email or web address]. Please follow instruction sheet, as given.    Follow-Up:  Your physician recommends that you schedule a follow-up appointment in: 2 MONTHS WITH  DR COOPER    Low-Sodium Eating Plan Sodium raises blood pressure and causes water to be held in the body. Getting less sodium from food will help lower your blood pressure, reduce any swelling, and protect your heart, liver, and kidneys. We get sodium by adding salt (sodium chloride) to food. Most of our sodium comes from canned, boxed, and frozen foods. Restaurant foods, fast foods, and pizza are also very high in sodium. Even if you take medicine to lower your blood pressure or to reduce fluid in your body, getting less sodium from your food is important. WHAT IS MY PLAN? Most people should limit their sodium intake to 2,300 mg a day. Your health care provider recommends that you limit your sodium intake to __________ a day.  WHAT DO I NEED TO KNOW ABOUT THIS EATING PLAN? For the low-sodium eating plan, you will follow these general guidelines:  Choose foods with a % Daily Value for sodium of less than 5% (as listed on the food label).   Use salt-free seasonings or herbs instead of table salt or sea salt.   Check with your health care provider or pharmacist before using salt substitutes.   Eat fresh foods.  Eat more vegetables and fruits.  Limit canned vegetables. If you do use them, rinse them well to decrease the sodium.   Limit cheese to 1 oz (28 g) per day.   Eat lower-sodium products, often labeled as "lower sodium" or "no salt added."  Avoid foods  that contain monosodium glutamate (MSG). MSG is sometimes added to [place] food and some canned foods.  Check food labels (Nutrition Facts labels) on foods to learn how much sodium is in one serving.  Eat more home-cooked food and less restaurant, buffet, and fast food.  When eating at a restaurant, ask that your food be prepared with less salt, or no salt if possible.  HOW DO I READ FOOD LABELS FOR SODIUM INFORMATION? The Nutrition Facts label lists the amount of sodium in one serving of the food. If you eat more than one serving, you must multiply the listed amount of sodium by the number of servings. Food labels may also identify foods as:  Sodium free--Less than 5 mg in a serving.  Very low sodium--35 mg or less in a serving.  Low sodium--140 mg or less in a serving.  Light in sodium--50% less sodium in a serving. For example, if a food that usually has 300 mg of sodium is changed to become light in sodium, it will have 150 mg of sodium.  Reduced sodium--25% less sodium in a serving. For example, if a food that usually has 400 mg of sodium is changed to reduced sodium, it will have 300 mg of sodium. WHAT FOODS CAN I EAT? Grains Low-sodium cereals, including oats, puffed wheat and rice, and shredded wheat cereals. Low-sodium crackers. Unsalted rice and pasta. Lower-sodium bread.  Vegetables Frozen or fresh vegetables. Low-sodium or reduced-sodium canned vegetables. Low-sodium or reduced-sodium tomato sauce and paste. Low-sodium or reduced-sodium tomato and vegetable juices.  Fruits Fresh, frozen, and canned fruit. Fruit juice.  Meat and Other Protein Products Low-sodium canned tuna and salmon. Fresh or frozen meat, poultry, seafood, and fish. Lamb. Unsalted nuts. Dried beans, peas, and lentils without added salt. Unsalted canned beans. Homemade soups without salt. Eggs.  Dairy Milk. Soy milk. Ricotta cheese. Low-sodium or reduced-sodium cheeses. Yogurt.  Condiments Fresh  and dried herbs and spices. Salt-free seasonings. Onion and garlic powders. Low-sodium varieties of mustard and  ketchup. Fresh or refrigerated horseradish. Lemon juice.  Fats and Oils Reduced-sodium salad dressings. Unsalted butter.  Other Unsalted popcorn and pretzels.  The items listed above may not be a complete list of recommended foods or beverages. Contact your dietitian for more options. WHAT FOODS ARE NOT RECOMMENDED? Grains Instant hot cereals. Bread stuffing, pancake, and biscuit mixes. Croutons. Seasoned rice or pasta mixes. Noodle soup cups. Boxed or frozen macaroni and cheese. Self-rising flour. Regular salted crackers. Vegetables Regular canned vegetables. Regular canned tomato sauce and paste. Regular tomato and vegetable juices. Frozen vegetables in sauces. Salted [place] fries. Olives. Angie Fava. Relishes. Sauerkraut. Salsa. Meat and Other Protein Products Salted, canned, smoked, spiced, or pickled meats, seafood, or fish. Bacon, ham, sausage, hot dogs, corned beef, chipped beef, and packaged luncheon meats. Salt pork. Jerky. Pickled herring. Anchovies, regular canned tuna, and sardines. Salted nuts. Dairy Processed cheese and cheese spreads. Cheese curds. Blue cheese and cottage cheese. Buttermilk.  Condiments Onion and garlic salt, seasoned salt, table salt, and sea salt. Canned and packaged gravies. Worcestershire sauce. Tartar sauce. Barbecue sauce. Teriyaki sauce. Soy sauce, including reduced sodium. Steak sauce. Fish sauce. Oyster sauce. Cocktail sauce. Horseradish that you find on the shelf. Regular ketchup and mustard. Meat flavorings and tenderizers. Bouillon cubes. Hot sauce. Tabasco sauce. Marinades. Taco seasonings. Relishes. Fats and Oils Regular salad dressings. Salted butter. Margarine. Ghee. Bacon fat.  Other Potato and tortilla chips. Corn chips and puffs. Salted popcorn and pretzels. Canned or dried soups. Pizza. Frozen entrees and pot pies.  The  items listed above may not be a complete list of foods and beverages to avoid. Contact your dietitian for more information.   This information is not intended to replace advice given to you by your health care provider. Make sure you discuss any questions you have with your health care provider.   Document Released: 09/30/2001 Document Revised: 05/01/2014 Document Reviewed: 02/12/2013 Elsevier Interactive Patient Education 2016 [place], Ermalinda Barrios, [place]  02/08/2016 8:34 AM    [facility] [address], [place], [place]  [address] Phone: [phone]; Fax: [phone]

## 2016-02-08 ENCOUNTER — Inpatient Hospital Stay
Admission: RE | Admit: 2016-02-08 | Discharge: 2016-02-08 | Disposition: A | Discharge status: Home / Self Care | Payer: BLUE CROSS/BLUE SHIELD | Source: Ambulatory Visit

## 2016-02-08 ENCOUNTER — Encounter: Payer: Self-pay | Admitting: Physician Assistant

## 2016-02-08 ENCOUNTER — Ambulatory Visit (INDEPENDENT_AMBULATORY_CARE_PROVIDER_SITE_OTHER): Payer: BLUE CROSS/BLUE SHIELD | Admitting: Physician Assistant

## 2016-02-08 VITALS — BP 180/100 | HR 59 | Ht 69.0 in | Wt 170.0 lb

## 2016-02-08 DIAGNOSIS — Z01818 Encounter for other preprocedural examination: Secondary | ICD-10-CM

## 2016-02-08 DIAGNOSIS — R072 Precordial pain: Secondary | ICD-10-CM

## 2016-02-08 DIAGNOSIS — Z9861 Coronary angioplasty status: Secondary | ICD-10-CM

## 2016-02-08 DIAGNOSIS — I10 Essential (primary) hypertension: Secondary | ICD-10-CM | POA: Diagnosis not present

## 2016-02-08 DIAGNOSIS — J449 Chronic obstructive pulmonary disease, unspecified: Secondary | ICD-10-CM | POA: Insufficient documentation

## 2016-02-08 DIAGNOSIS — I2510 Atherosclerotic heart disease of native coronary artery without angina pectoris: Secondary | ICD-10-CM

## 2016-02-08 DIAGNOSIS — E785 Hyperlipidemia, unspecified: Secondary | ICD-10-CM

## 2016-02-08 MED ORDER — POTASSIUM CHLORIDE CRYS ER 20 MEQ PO TBCR: 20.0000 meq | EXTENDED_RELEASE_TABLET | Freq: Every day | ORAL | 3 refills | Status: DC

## 2016-02-08 MED ORDER — ATORVASTATIN CALCIUM 80 MG PO TABS: 80.0000 mg | ORAL_TABLET | Freq: Every day | ORAL | 2 refills | Status: DC

## 2016-02-08 MED ORDER — LISINOPRIL 10 MG PO TABS: 10.0000 mg | ORAL_TABLET | Freq: Every day | ORAL | 3 refills | Status: DC

## 2016-02-08 NOTE — Patient Instructions (Signed)
Medication Instructions:   START LISINOPRIL 10 MG ONCE DAILY  Testing/Procedures:  Your physician has requested that you have en exercise stress myoview. For further information please visit [email or web address]. Please follow instruction sheet, as given.    Follow-Up:  Your physician recommends that you schedule a follow-up appointment in: 2 MONTHS WITH DR Burt Knack    Low-Sodium Eating Plan Sodium raises blood pressure and causes water to be held in the body. Getting less sodium from food will help lower your blood pressure, reduce any swelling, and protect your heart, liver, and kidneys. We get sodium by adding salt (sodium chloride) to food. Most of our sodium comes from canned, boxed, and frozen foods. Restaurant foods, fast foods, and pizza are also very high in sodium. Even if you take medicine to lower your blood pressure or to reduce fluid in your body, getting less sodium from your food is important. WHAT IS MY PLAN? Most people should limit their sodium intake to 2,300 mg a day. Your health care provider recommends that you limit your sodium intake to __________ a day.  WHAT DO I NEED TO KNOW ABOUT THIS EATING PLAN? For the low-sodium eating plan, you will follow these general guidelines:  Choose foods with a % Daily Value for sodium of less than 5% (as listed on the food label).   Use salt-free seasonings or herbs instead of table salt or sea salt.   Check with your health care provider or pharmacist before using salt substitutes.   Eat fresh foods.  Eat more vegetables and fruits.  Limit canned vegetables. If you do use them, rinse them well to decrease the sodium.   Limit cheese to 1 oz (28 g) per day.   Eat lower-sodium products, often labeled as "lower sodium" or "no salt added."  Avoid foods that contain monosodium glutamate (MSG). MSG is sometimes added to [place] food and some canned foods.  Check food labels (Nutrition Facts labels) on foods to learn how  much sodium is in one serving.  Eat more home-cooked food and less restaurant, buffet, and fast food.  When eating at a restaurant, ask that your food be prepared with less salt, or no salt if possible.  HOW DO I READ FOOD LABELS FOR SODIUM INFORMATION? The Nutrition Facts label lists the amount of sodium in one serving of the food. If you eat more than one serving, you must multiply the listed amount of sodium by the number of servings. Food labels may also identify foods as:  Sodium free--Less than 5 mg in a serving.  Very low sodium--35 mg or less in a serving.  Low sodium--140 mg or less in a serving.  Light in sodium--50% less sodium in a serving. For example, if a food that usually has 300 mg of sodium is changed to become light in sodium, it will have 150 mg of sodium.  Reduced sodium--25% less sodium in a serving. For example, if a food that usually has 400 mg of sodium is changed to reduced sodium, it will have 300 mg of sodium. WHAT FOODS CAN I EAT? Grains Low-sodium cereals, including oats, puffed wheat and rice, and shredded wheat cereals. Low-sodium crackers. Unsalted rice and pasta. Lower-sodium bread.  Vegetables Frozen or fresh vegetables. Low-sodium or reduced-sodium canned vegetables. Low-sodium or reduced-sodium tomato sauce and paste. Low-sodium or reduced-sodium tomato and vegetable juices.  Fruits Fresh, frozen, and canned fruit. Fruit juice.  Meat and Other Protein Products Low-sodium canned tuna and salmon. Fresh or frozen meat, poultry,  seafood, and fish. Lamb. Unsalted nuts. Dried beans, peas, and lentils without added salt. Unsalted canned beans. Homemade soups without salt. Eggs.  Dairy Milk. Soy milk. Ricotta cheese. Low-sodium or reduced-sodium cheeses. Yogurt.  Condiments Fresh and dried herbs and spices. Salt-free seasonings. Onion and garlic powders. Low-sodium varieties of mustard and ketchup. Fresh or refrigerated horseradish. Lemon juice.    Fats and Oils Reduced-sodium salad dressings. Unsalted butter.  Other Unsalted popcorn and pretzels.  The items listed above may not be a complete list of recommended foods or beverages. Contact your dietitian for more options. WHAT FOODS ARE NOT RECOMMENDED? Grains Instant hot cereals. Bread stuffing, pancake, and biscuit mixes. Croutons. Seasoned rice or pasta mixes. Noodle soup cups. Boxed or frozen macaroni and cheese. Self-rising flour. Regular salted crackers. Vegetables Regular canned vegetables. Regular canned tomato sauce and paste. Regular tomato and vegetable juices. Frozen vegetables in sauces. Salted [place] fries. Olives. Angie Fava. Relishes. Sauerkraut. Salsa. Meat and Other Protein Products Salted, canned, smoked, spiced, or pickled meats, seafood, or fish. Bacon, ham, sausage, hot dogs, corned beef, chipped beef, and packaged luncheon meats. Salt pork. Jerky. Pickled herring. Anchovies, regular canned tuna, and sardines. Salted nuts. Dairy Processed cheese and cheese spreads. Cheese curds. Blue cheese and cottage cheese. Buttermilk.  Condiments Onion and garlic salt, seasoned salt, table salt, and sea salt. Canned and packaged gravies. Worcestershire sauce. Tartar sauce. Barbecue sauce. Teriyaki sauce. Soy sauce, including reduced sodium. Steak sauce. Fish sauce. Oyster sauce. Cocktail sauce. Horseradish that you find on the shelf. Regular ketchup and mustard. Meat flavorings and tenderizers. Bouillon cubes. Hot sauce. Tabasco sauce. Marinades. Taco seasonings. Relishes. Fats and Oils Regular salad dressings. Salted butter. Margarine. Ghee. Bacon fat.  Other Potato and tortilla chips. Corn chips and puffs. Salted popcorn and pretzels. Canned or dried soups. Pizza. Frozen entrees and pot pies.  The items listed above may not be a complete list of foods and beverages to avoid. Contact your dietitian for more information.   This information is not intended to replace  advice given to you by your health care provider. Make sure you discuss any questions you have with your health care provider.   Document Released: 09/30/2001 Document Revised: 05/01/2014 Document Reviewed: 02/12/2013 Elsevier Interactive Patient Education [employer].

## 2016-02-08 NOTE — Pre-Procedure Instructions (Signed)
Erby Pian, MD - 01/28/2016 8:45 AM EDT Formatting of this note may be different from the original.  Follow-up  History of Present Illness: Timothy Yoder is a 60 y.o. male presents to clinic for copd, doing great huis far, no acute changes. No chest pain, syncope, ectopy, edema, calf pain, flu vaccine expected.   Current Medications:  Current Outpatient Prescriptions  Medication Sig Dispense Refill  . albuterol (PROAIR HFA) 90 mcg/actuation inhaler Inhale 1-2 inhalations into the lungs every 6 (six) hours as needed for Wheezing or Shortness of Breath. 3 Inhaler 3  . aspirin 81 MG chewable tablet Take 81 mg by mouth.  Marland Kitchen atorvastatin (LIPITOR) 80 MG tablet Take by mouth.  . carvedilol (COREG) 3.125 MG tablet Take 3.125 mg by mouth 2 (two) times daily with meals.  . nitroGLYcerin (NITROSTAT) 0.4 MG SL tablet Place under the tongue.  Marland Kitchen omeprazole (PRILOSEC) 40 MG DR capsule Take 1 capsule (40 mg total) by mouth 2 (two) times daily. 180 capsule 1  . potassium chloride (K-DUR,KLOR-CON) 20 MEQ ER tablet Take by mouth.  . tamsulosin (FLOMAX) 0.4 mg capsule Take 1 capsule (0.4 mg total) by mouth once daily. Take 30 minutes after same meal each day. 90 capsule 3  . umeclidinium-vilanterol (ANORO ELLIPTA) 62.5-25 mcg/actuation inhaler Inhale 1 inhalation into the lungs once daily. 1 Inhaler 2   No current facility-administered medications for this visit.   Problem List:  Patient Active Problem List  Diagnosis  . Gastroesophageal reflux disease without esophagitis  . Hx of adenomatous colonic polyps  . ST elevation (STEMI) myocardial infarction involving other coronary artery of anterior wall (CMS-HCC)  . Atherosclerotic heart disease of native coronary artery without angina pectoris  . Essential (primary) hypertension  . Hyperlipidemia, unspecified   History: Past Medical History:  Diagnosis Date  . Abdominal hernia  . Anxiety  . Asthma  . Atherosclerotic heart disease  . Chronic  heartburn  . Colon polyp  . Essential (primary) hypertension  . GERD (gastroesophageal reflux disease)  . H/O heart artery stent  . Heart attack January 2016  . Hx of adenomatous colonic polyps 10/14/2014  . Hyperlipidemia  . Hyperlipidemia, acquired  . STEMI (ST elevation myocardial infarction) (CMS-HCC)   Past Surgical History:  Procedure Laterality Date  . COLONOSCOPY 10/02/2011  Adenomatous Polyps: CBF 09/2014; Recall Ltr mailed 08/03/2014 (dw); Posponed til 04/2015 per Dawson Bills NP (dw); Recall Ltr mailed 03/29/2015 (dw)  . COLONOSCOPY 08/02/2015  Adenomatous Polyp: CBF 07/2020  . COLONOSCOPY  . CORONARY ANGIOPLASTY WITH STENT PLACEMENT January 2016  . EGD 10/02/2011  No repeat per RTE  . EGD 08/02/2015  No repeat per RTE  . EGD 08/02/2015  No repeat per RTE  . INTRO CATH TO MAIN PULMONARY ARTERY/RIGHT HEART  . KNEE ARTHROSCOPY Left 06/03/99  . Meniscal repair   Family History  Problem Relation Age of Onset  . Lung cancer Brother  . Hypertension Mother  . Diabetes type II Mother  . Heart attack Mother  . Heart attack Father  . Sudden cardiac death Father  . No Known Problems Son  . Celiac disease Brother  . Colon cancer Brother   Social History   Social History  . Marital status: Married  Spouse name: N/A  . Number of children: N/A  . Years of education: N/A   Social History Main Topics  . Smoking status: Former Smoker  Packs/day: 0.50  Years: 40.00  Quit date: 03/25/2011  . Smokeless tobacco: Never Used  .  Alcohol use No  . Drug use: No  . Sexual activity: No   Other Topics Concern  . None   Social History Narrative   Allergies:  Review of patient's allergies indicates no known allergies.  Review of Systems: As per above. Pretty much unchanged with the exception that his breathing is improved. No associated cardiopulmonary, GI, GU, dermatological symptoms today. No focal neurological symptoms or psychological changes.   Physical Exam: BP 149/86   Pulse 70  Temp 36.6 C (97.9 F) (Oral)  Wt 75.3 kg (166 lb)  SpO2 97%  BMI 26.79 kg/m2 75.3 kg (166 lb) 97% General: NAD. Able to speak in complete sentences without cough or dyspnea HEENT: Normocephalic, nontraumatic. Extraocular movements intact NECK: Supple. No JVD, nodes, thyromegaly CV: RRR no murmurs, gallops, rubs PULM: Normal respiratory effort, Clear to auscultation bilaterally without wheezing or crackles EXTREMITIES: No significant edema, cyanosis or Homans'signs SKIN: Fair turgor. No rashes LYMPHATIC: No nodes NEURO: No gross deficits PSYCH: Appropriate affect, Alert, oriented  Impression: SOB (shortness of breath) Looks more like copd early stage III, ex smoker, improved with our efforts   Will continue proair prn and anoro Follow up in 20 weeks       Plan of Treatment - as of this encounter  Upcoming Encounters Upcoming Encounters  Date Type Specialty Care Team Description  03/01/2016 Post Op General Surgery Delrae Alfred., MD  [facility]  [place], [place] [address]  [phone]  [phone] (Fax)    04/06/2016 Office Visit Internal Medicine Charlett Lango, MD  [place]  [facility] [place], [place] [address]  [phone]  [phone] ([phone]    08/01/2016 Office Visit Pulmonology Erby Pian, MD  [facility]  [place], [place] [address]  [phone]  [phone] (Fax)     Visit Diagnoses - in this encounter  Diagnosis  COPD, moderate , unspecified (CMS-HCC) - Primary   Document Information  Service Providers Document Coverage Dates Oct. 06, 2017  [place] [phone] (Work) [place], [place] [address]   Encounter Providers Erby Pian MD (Attending) tel:[phone] (Work) fax:[phone] [place],  [address]   Encounter Date Oct. 06, 2017

## 2016-02-10 ENCOUNTER — Encounter (HOSPITAL_COMMUNITY)
Admission: RE | Admit: 2016-02-10 | Discharge: 2016-02-10 | Disposition: A | Discharge status: Home / Self Care | Payer: BLUE CROSS/BLUE SHIELD | Source: Ambulatory Visit | Attending: Physician Assistant | Admitting: Physician Assistant

## 2016-02-10 ENCOUNTER — Telehealth: Payer: Self-pay | Admitting: Cardiovascular Disease

## 2016-02-10 DIAGNOSIS — Z01818 Encounter for other preprocedural examination: Secondary | ICD-10-CM | POA: Diagnosis not present

## 2016-02-10 LAB — NM MYOCAR MULTI W/SPECT W/WALL MOTION / EF
CHL CUP MPHR: 159 {beats}/min
CHL CUP RESTING HR STRESS: 90 {beats}/min
CHL CUP STRESS STAGE 1 SBP: 166 mmHg
CHL CUP STRESS STAGE 2 GRADE: 0 %
CHL CUP STRESS STAGE 3 GRADE: 0 %
CHL CUP STRESS STAGE 3 HR: 96 {beats}/min
CHL CUP STRESS STAGE 4 HR: 96 {beats}/min
CHL CUP STRESS STAGE 5 SPEED: 1.7 mph
CHL CUP STRESS STAGE 6 GRADE: 12 %
CHL CUP STRESS STAGE 6 SPEED: 2.5 mph
CHL CUP STRESS STAGE 7 DBP: 100 mmHg
CHL CUP STRESS STAGE 7 GRADE: 0 %
CHL CUP STRESS STAGE 7 HR: 142 {beats}/min
CHL CUP STRESS STAGE 7 SPEED: 0 mph
CHL CUP STRESS STAGE 8 DBP: 96 mmHg
CHL CUP STRESS STAGE 8 SBP: 168 mmHg
CSEPED: 4 min
CSEPEDS: 13 s
CSEPEW: 6 METS
CSEPHR: 96 %
LV sys vol: 22 mL
LVDIAVOL: 55 mL (ref 62–150)
Peak HR: 153 {beats}/min
Percent of predicted max HR: 96 %
RATE: 0.31
SDS: 5
SRS: 2
SSS: 7
Stage 1 DBP: 105 mmHg
Stage 1 Grade: 0 %
Stage 1 HR: 93 {beats}/min
Stage 1 Speed: 0 mph
Stage 2 HR: 99 {beats}/min
Stage 2 Speed: 0 mph
Stage 3 Speed: 1.6 mph
Stage 4 Grade: 0 %
Stage 4 Speed: 1.7 mph
Stage 5 DBP: 102 mmHg
Stage 5 Grade: 10 %
Stage 5 HR: 146 {beats}/min
Stage 5 SBP: 190 mmHg
Stage 6 HR: 153 {beats}/min
Stage 7 SBP: 205 mmHg
Stage 8 Grade: 0 %
Stage 8 HR: 94 {beats}/min
Stage 8 Speed: 0 mph
TID: 1.02

## 2016-02-10 MED ORDER — TECHNETIUM TC 99M TETROFOSMIN IV KIT
30.0000 | PACK | Freq: Once | INTRAVENOUS | Status: AC | PRN
  Administered 2016-02-10: 30 via INTRAVENOUS

## 2016-02-10 MED ORDER — TECHNETIUM TC 99M TETROFOSMIN IV KIT
10.0000 | PACK | Freq: Once | INTRAVENOUS | Status: AC | PRN
  Administered 2016-02-10: 10 via INTRAVENOUS

## 2016-02-10 NOTE — Telephone Encounter (Signed)
Makailyn Mccormick, RN from [facility] calling in to check on status of cardiac clearance for Lap Chole scheduled for  10/24. Pt seen by Ermalinda Barrios, PA on 10/17 and ordered exercise stress myoview for reported precordial pain w/ hx CAD, PCI.   Pt completed testing this morning - results not available at the current time. She explains that all they need is letter stating pt is cleared/not cleared for procedure.  She is aware that test will need to reviewed first by MD.  She is asking for clearance by tomorrow be faxed to their office. She understands I will route this to Dr. Burt Knack to address and office will contact her as soon as Burt Knack has a chance to review results.

## 2016-02-10 NOTE — Telephone Encounter (Signed)
New Message:   Sherri from [facility] wants to speak to a triage nurse-

## 2016-02-11 NOTE — Telephone Encounter (Signed)
Left a message for Sherri at [facility] to call back, in regards to cardiac clearance needed for pts upcoming Surgery on Tuesday.  Noted in the pts chart that his stress test was complete and was reviewed by Estella Husk.  Waiting for Dr Burt Knack to read this, and advise on cardiac clearance needed.

## 2016-02-11 NOTE — Telephone Encounter (Signed)
April from [facility] answering service, is calling back to inform us that Sherri RN in their clinic is gone for the day.  April states that Dr Burt Knack and RN should follow-up with their office on next Monday, to advise to [facility], whether the pt is cleared or not for her upcoming Surgery for next Tuesday 10/24.  Informed April that I will route this message to both Dr Burt Knack and RN for further review, advise on clearance needed, and follow-up with Sherri RN at [facility] thereafter.  April verbalized understanding and agrees with this plan.

## 2016-02-11 NOTE — Telephone Encounter (Signed)
Follow up      Calling to follow up on clearance.  Surgery is tuesday

## 2016-02-11 NOTE — Telephone Encounter (Signed)
Follow up    April from [facility] verbalized that she is returning call for rn about pt  She did not disclose any information other than she wanted to speak to the rn

## 2016-02-14 ENCOUNTER — Telehealth: Payer: Self-pay | Admitting: Physician Assistant

## 2016-02-14 ENCOUNTER — Encounter: Payer: Self-pay | Admitting: *Deleted

## 2016-02-14 NOTE — Telephone Encounter (Signed)
Myoview scan normal with normal LVEF and no ischemia. Pt cleared for surgery at low cardiac risk.

## 2016-02-14 NOTE — Patient Instructions (Addendum)
  Your procedure is scheduled on: 02-15-16 Report to Same Day Surgery 2nd floor medical mall To find out your arrival time please call [phone] between 1PM - 3PM on 02-14-16  Remember: Instructions that are not followed completely may result in serious medical risk, up to and including death, or upon the discretion of your surgeon and anesthesiologist your surgery may need to be rescheduled.    _x___ 1. Do not eat food or drink liquids after midnight. No gum chewing or hard candies.     __x__ 2. No Alcohol for 24 hours before or after surgery.   __x__3. No Smoking for 24 prior to surgery.   ____  4. Bring all medications with you on the day of surgery if instructed.    __x__ 5. Notify your doctor if there is any change in your medical condition     (cold, fever, infections).     Do not wear jewelry, make-up, hairpins, clips or nail polish.  Do not wear lotions, powders, or perfumes. You may wear deodorant.  Do not shave 48 hours prior to surgery. Men may shave face and neck.  Do not bring valuables to the hospital.    [facility] is not responsible for any belongings or valuables.               Contacts, dentures or bridgework may not be worn into surgery.  Leave your suitcase in the car. After surgery it may be brought to your room.  For patients admitted to the hospital, discharge time is determined by your treatment team.   Patients discharged the day of surgery will not be allowed to drive home.    Please read over the following fact sheets that you were given:   [facility] Preparing for Surgery and or MRSA Information   _x___ Take these medicines the morning of surgery with A SIP OF WATER:    1. COREG  2. LISINOPRIL  3. PRILOSEC  4.  5.  6.  ____Fleets enema or Magnesium Citrate as directed.   ____ Use CHG Soap or sage wipes as directed on instruction sheet   _X___ Use inhalers on the day of surgery and bring to hospital day of surgery-USE ANORO AND ALBUTEROL  INHALER AT [place]  ____ Stop metformin 2 days prior to surgery    ____ Take 1/2 of usual insulin dose the night before surgery and none on the morning of  surgery.   _X___ Stop aspirin or coumadin, or plavix-PT STOPPED ASA ON 02-07-16    x__ Stop Anti-inflammatories such as Advil, Aleve, Ibuprofen, Motrin, Naproxen,          Naprosyn, Goodies powders or aspirin products. Ok to take Tylenol.   ____ Stop supplements until after surgery.    ____ Bring C-Pap to the hospital.

## 2016-02-14 NOTE — Pre-Procedure Instructions (Signed)
NM Myocar Multi W/Spect W/Wall Motion / EF (Accession [id number]) (Order [id number])  Imaging  Date: 02/10/2016 Department: [facility] NUCLEAR MEDICINE Released By: Reggy Eye Authorizing: Imogene Burn, PA-C  Exam Information   Status Exam Begun  Exam Ended   Final [99] 02/10/2016 9:28 AM 02/10/2016 11:54 AM  PACS Images   Show images for NM Myocar Multi W/Spect W/Wall Motion / EF  Study Result     Blood pressure demonstrated a hypertensive response to exercise.  No T wave inversion was noted during stress.  The study is normal.  This is a low risk study.  The left ventricular ejection fraction is normal (55-65%).   Normal resting and stress perfusion. No ischemia or infarction EF 60%   Scans on Order [id number]   Scan on 02/10/2016 1:25 PM by Provider Default, MD  Result History   NM Myocar Multi W/Spect W/Wall Motion / EF (Order #[id number]) on 02/10/2016 - Order Result History Report  Result Notes   Notes Recorded by Tamsen Snider on 02/14/2016 at 10:12 AM EDT Left message on machine for pt to contact the office.  ------  Notes Recorded by Imogene Burn, PA-C on 02/14/2016 at 8:01 AM EDT Normal stress test but blood pressure high with exercise. F/u with primary care for HTN      Vitals   Height Weight BMI (Calculated)  '5\' 9"'$  (1.753 m) 170 lb (77.1 kg) 25.2  Nuclear Stress Findings   Isotope administration Rest isotope was administered with an IV injection of 10 mCi technetium tetrofosmin. Rest SPECT images were obtained approximately 45 minutes post tracer injection. Stress isotope was administered with an IV injection of 30 mCi technetium tetrofosmin 2 seconds post IV Lexiscan administration. Stress SPECT images were obtained approximately 30 minutes post tracer injection.    Nuclear Study Quality Overall image quality is poor.Diaphragmatic attenuation artifact was present. Image quality affected due to significant  extracardiac activity.    Nuclear Measurements Study was gated.    Perfusion Summary Normal resting and stress perfusion. No ischemia or infarction EF 60%    Overall Study Impression Myocardial perfusion is normal. The study is normal. This is a low risk study. Overall left ventricular systolic function was normal. LV cavity size is normal. The left ventricular ejection fraction is normal (55-65%). There is no prior study for comparison.  From: ACCF/SCAI/STS/AATS/AHA/ASNC/HFSA/SCCT 2012 Appropriate Use Criteria for Coronary Revascularization Focused Update    Nuclear Stress Measurements   LV sys vol 22 mL    TID 1.02     LV dias vol 55 mL    LHR 0.31     SSS 7     SRS 2     SDS 5          Stress Measurements   Baseline Vitals  Rest HR 90 bpm    Rest BP 166/105 mmHg    Exercise Time  Exercise duration (min) 4 min    Exercise duration (sec) 13 sec    Peak Stress Vitals  Peak HR 153 BPM    Exercise Data  MPHR 159 bpm    Percent HR 96 %    Estimated workload 6 METS       Stress Findings   ECG Baseline ECG exhibits normal sinus rhythm..    Stress Findings The patient exercised following the Bruce protocol.  The patient reported shortness of breath during the stress test. The patient experienced no angina during the stress test.   Test was  stopped per protocol.   Heart rate demonstrated a normal response to exercise. Blood pressure demonstrated a hypertensive response to exercise. Overall, the patient's exercise capacity was normal.   85% of maximum heart rate was achieved after 2 minutes. Recovery time: 5 minutes.  Duke Treadmill Score: low risk The patient's response to exercise was adequate for diagnosis.    Response to Stress No T wave inversion was noted during stress. Arrhythmias during stress: none.  Arrhythmias during recovery: none.  There were no significant arrhythmias noted during the test.  ECG was interpretable and non-conclusive.    Wall  Scoring   Score Index: 1.000 Percent Normal: 100.0%           The left ventricular wall motion is normal.          External Result Report   External Result Report  Imaging   Imaging Information  Signed by   Signed Date/Time  Phone Pager  Josue Hector 02/10/2016 3:30 PM [phone]   Result Notes   Notes Recorded by Tamsen Snider on 02/14/2016 at 10:12 AM EDT Left message on machine for pt to contact the office.  ------  Notes Recorded by Imogene Burn, PA-C on 02/14/2016 at 8:01 AM EDT Normal stress test but blood pressure high with exercise. F/u with primary care for HTN      Signed   Electronically signed by Josue Hector, MD on 02/10/16 at 1531 EDT  Imaging Related Medications   Medication  technetium tetrofosmin (TC-MYOVIEW) injection 10 millicurie  Route: Intravenous  Admin Amount: 10 millicurie  PRN Reason(s): radiopharmaceutical  Last Admin Time: 02/10/16 0950  Number of Expected Doses: 1  Most Recent Administration:  User Action Time Recorded Time Dose Route Site Comment Action Reason  Orpah Melter 02/10/16 8502 77/41/28 7867 10 millicurie Intravenous   Contrast Given   Full Administration Report            Medication  technetium tetrofosmin (TC-MYOVIEW) injection 30 millicurie  Route: Intravenous  Admin Amount: 30 millicurie  PRN Reason(s): radiopharmaceutical  Last Admin Time: 02/10/16 1110  Number of Expected Doses: 1  Most Recent Administration:  User Action Time Recorded Time Dose Route Site Comment Action Reason  Byrd Hesselbach, RT 02/10/16 1110 67/20/94 7096 30 millicurie Intravenous Right Arm  Contrast Given   Full Administration Report            Original Order   Ordered On Ordered By   02/08/2016 8:59 AM Cristopher Estimable, RN           PACS Images   Show images for NM Myocar Multi W/Spect W/Wall Motion / EF  NM Myocar Multi W/Spect W/Wall Motion / EF (Order [id number])  Imaging  Date:  02/10/2016 Department: [place] MEDICINE Released By: Reggy Eye Authorizing: Imogene Burn, PA-C  Order Information   Order Date/Time Release Date/Time Start Date/Time End Date/Time  02/10/16 09:21 AM 02/10/16 09:21 AM 02/10/16 09:21 AM 02/10/2016  Order Details   Frequency Duration Priority Order Class  As needed 1 occurrence Routine Ancillary Performed  Appointment Information   Appt Date/Time/Location   02/10/2016 10:00 AM  at   [place]  PreCertification Number (if available)   Order Questions   Question Answer Comment  Study with Treadmill only no medication needed (Bruce Protocol)   Will [facility] be the location of this test? No   Reason for Exam (SYMPTOM OR DIAGNOSIS REQUIRED)  CHEST PAIN   Note:  Appropriate reason for exam on pre-op X-rays should indicate the reason for the surgery. Examples include: Coronary Artery disease, osteoartiritis of the knee, brain tumor, etc.  Preferred imaging location? [facility]   If indicated for the ordered procedure, I authorize the administration of a radiopharmaceutical per Radiology protocol Yes       Order History  Inpatient  Date/Time Action Taken User Additional [place] In process  02/10/16 6381 Release Reggy Eye FromOrder:[id number]  02/10/16 1115 Result Cheryln Manly, NP In process  02/10/16 1159 Result Byrd Hesselbach, RT In process  02/10/16 1324 Result Interface, Three One Seven In process  02/10/16 1529 Result Josue Hector, MD Final  Verbal Order Info   Action Created on Order Mode Entered by Responsible Provider Signed by Signed on  Ordering 02/08/16 [id number] Verbal with Read Back: Cosign Required Cristopher Estimable, RN Imogene Burn, PA-C Imogene Burn, PA-C 02/08/16 1210  Associated Diagnoses    ICD-9-CM ICD-10-CM  Preoperative clearance    V72.84 Z01.818  Collection  Information   Collected: 02/10/2016 10:46 AM   Resulting Agency: [facility]    Reviewed By List   Imogene Burn, PA-C on 02/14/2016 08:01  Order Provider Info     Office phone Pager E-mail  Ordering User Cristopher Estimable, [place] -- [email or web address]  Authorizing Provider Imogene Burn, PA-C [phone] -- [email or web address]  Attending Provider Imogene Burn, PA-C [phone] -- [email or web address]  Billing Provider Josue Hector, MD [phone] -- [email or web address]  Order-Level Documents:   Scan on 02/10/2016 1:25 PM by Provider Default, MD      Reprint Requisition   NM Myocar Multi W/Spect W/Wall Motion / EF (Order #[id number]) on 02/10/16  Original Order   Ordered On Ordered By   02/08/2016 8:59 AM Cristopher Estimable, RN           Future Appointments    Provider [place]  02/28/2016 8:15 AM Imogene Burn, PA-C [address] [facility]  05/04/2016 8:00 AM (Arrive by 7:45 AM) Sherren Mocha, MD [place] [facility]

## 2016-02-14 NOTE — Telephone Encounter (Signed)
Follow up     Office needs to know something by noon today.   Request for surgical clearance:  1. What type of surgery is being performed? Lap chole 2. When is this surgery scheduled? 10.24.2017   3. Are there any medications that need to be held prior to surgery and how long? No   4. Name of physician performing surgery? Dr. Tamala Julian    5. What is your office phone and fax number? Fax [phone] / phone [phone]

## 2016-02-14 NOTE — Telephone Encounter (Signed)
F/u Message ° °Pt returning RN call. Please call back to discuss  °

## 2016-02-14 NOTE — Telephone Encounter (Signed)
Clearance note, myoview and copy of office note faxed to Attn: [place], [phone].

## 2016-02-14 NOTE — Pre-Procedure Instructions (Signed)
CARDIAC CLEARANCE IN EPIC FROM DR COOPER-LOW RISK

## 2016-02-15 ENCOUNTER — Ambulatory Visit: Payer: BLUE CROSS/BLUE SHIELD

## 2016-02-15 ENCOUNTER — Ambulatory Visit
Admission: RE | Admit: 2016-02-15 | Discharge: 2016-02-15 | Disposition: A | Discharge status: Home / Self Care | Payer: BLUE CROSS/BLUE SHIELD | Source: Ambulatory Visit | Attending: Surgery | Admitting: Surgery

## 2016-02-15 ENCOUNTER — Encounter
Admission: RE | Disposition: A | Discharge status: Home / Self Care | Payer: Self-pay | Source: Ambulatory Visit | Attending: Surgery

## 2016-02-15 ENCOUNTER — Ambulatory Visit: Payer: BLUE CROSS/BLUE SHIELD | Admitting: Certified Registered"

## 2016-02-15 ENCOUNTER — Encounter: Payer: Self-pay | Admitting: *Deleted

## 2016-02-15 DIAGNOSIS — K8010 Calculus of gallbladder with chronic cholecystitis without obstruction: Secondary | ICD-10-CM | POA: Insufficient documentation

## 2016-02-15 DIAGNOSIS — I252 Old myocardial infarction: Secondary | ICD-10-CM | POA: Diagnosis not present

## 2016-02-15 DIAGNOSIS — Z955 Presence of coronary angioplasty implant and graft: Secondary | ICD-10-CM | POA: Insufficient documentation

## 2016-02-15 DIAGNOSIS — Z87891 Personal history of nicotine dependence: Secondary | ICD-10-CM | POA: Insufficient documentation

## 2016-02-15 DIAGNOSIS — K8020 Calculus of gallbladder without cholecystitis without obstruction: Secondary | ICD-10-CM

## 2016-02-15 DIAGNOSIS — K219 Gastro-esophageal reflux disease without esophagitis: Secondary | ICD-10-CM | POA: Diagnosis not present

## 2016-02-15 DIAGNOSIS — I2510 Atherosclerotic heart disease of native coronary artery without angina pectoris: Secondary | ICD-10-CM | POA: Diagnosis not present

## 2016-02-15 DIAGNOSIS — I10 Essential (primary) hypertension: Secondary | ICD-10-CM | POA: Diagnosis not present

## 2016-02-15 DIAGNOSIS — N400 Enlarged prostate without lower urinary tract symptoms: Secondary | ICD-10-CM | POA: Diagnosis not present

## 2016-02-15 DIAGNOSIS — E785 Hyperlipidemia, unspecified: Secondary | ICD-10-CM | POA: Insufficient documentation

## 2016-02-15 DIAGNOSIS — J449 Chronic obstructive pulmonary disease, unspecified: Secondary | ICD-10-CM | POA: Diagnosis not present

## 2016-02-15 HISTORY — PX: CHOLECYSTECTOMY: SHX55

## 2016-02-15 HISTORY — DX: Acute myocardial infarction, unspecified: I21.9

## 2016-02-15 HISTORY — DX: Chronic obstructive pulmonary disease, unspecified: J44.9

## 2016-02-15 SURGERY — LAPAROSCOPIC CHOLECYSTECTOMY WITH INTRAOPERATIVE CHOLANGIOGRAM
Anesthesia: General | Wound class: Clean Contaminated

## 2016-02-15 MED ORDER — HEPARIN SODIUM (PORCINE) 5000 UNIT/ML IJ SOLN
INTRAMUSCULAR | Status: AC
  Filled 2016-02-15: qty 1

## 2016-02-15 MED ORDER — HYDROCODONE-ACETAMINOPHEN 5-325 MG PO TABS
ORAL_TABLET | ORAL | Status: AC
  Filled 2016-02-15: qty 1

## 2016-02-15 MED ORDER — FENTANYL CITRATE (PF) 100 MCG/2ML IJ SOLN
25.0000 ug | INTRAMUSCULAR | Status: DC | PRN
  Administered 2016-02-15 (×2): 25 ug via INTRAVENOUS

## 2016-02-15 MED ORDER — FENTANYL CITRATE (PF) 100 MCG/2ML IJ SOLN
INTRAMUSCULAR | Status: AC
  Administered 2016-02-15: 25 ug via INTRAVENOUS
  Filled 2016-02-15: qty 2

## 2016-02-15 MED ORDER — SODIUM CHLORIDE 0.9 % IV SOLN
INTRAVENOUS | Status: DC | PRN
  Administered 2016-02-15: 300 mL via INTRAMUSCULAR

## 2016-02-15 MED ORDER — GLYCOPYRROLATE 0.2 MG/ML IJ SOLN
INTRAMUSCULAR | Status: DC | PRN
  Administered 2016-02-15: .8 mg via INTRAVENOUS

## 2016-02-15 MED ORDER — HYDROCODONE-ACETAMINOPHEN 5-325 MG PO TABS: 1.0000 | ORAL_TABLET | ORAL | 0 refills | Status: DC | PRN

## 2016-02-15 MED ORDER — FENTANYL CITRATE (PF) 100 MCG/2ML IJ SOLN
INTRAMUSCULAR | Status: DC | PRN
Start: 2016-02-15 — End: 2016-02-15
  Administered 2016-02-15: 100 ug via INTRAVENOUS
  Administered 2016-02-15 (×3): 50 ug via INTRAVENOUS
  Administered 2016-02-15 (×2): 25 ug via INTRAVENOUS

## 2016-02-15 MED ORDER — PROMETHAZINE HCL 25 MG/ML IJ SOLN: 6.2500 mg | INTRAMUSCULAR | Status: DC | PRN

## 2016-02-15 MED ORDER — LABETALOL HCL 5 MG/ML IV SOLN
INTRAVENOUS | Status: DC | PRN
  Administered 2016-02-15 (×3): 5 mg via INTRAVENOUS

## 2016-02-15 MED ORDER — LACTATED RINGERS IV SOLN
INTRAVENOUS | Status: DC
  Administered 2016-02-15: 10:00:00 via INTRAVENOUS

## 2016-02-15 MED ORDER — OXYCODONE HCL 5 MG/5ML PO SOLN: 5.0000 mg | Freq: Once | ORAL | Status: DC | PRN

## 2016-02-15 MED ORDER — NEOSTIGMINE METHYLSULFATE 10 MG/10ML IV SOLN
INTRAVENOUS | Status: DC | PRN
  Administered 2016-02-15: 4 mg via INTRAVENOUS

## 2016-02-15 MED ORDER — HYDROCODONE-ACETAMINOPHEN 5-325 MG PO TABS
1.0000 | ORAL_TABLET | ORAL | Status: DC | PRN
  Administered 2016-02-15: 1 via ORAL

## 2016-02-15 MED ORDER — OXYCODONE HCL 5 MG PO TABS: 5.0000 mg | ORAL_TABLET | Freq: Once | ORAL | Status: DC | PRN

## 2016-02-15 MED ORDER — LIDOCAINE HCL (CARDIAC) 20 MG/ML IV SOLN
INTRAVENOUS | Status: DC | PRN
  Administered 2016-02-15: 80 mg via INTRAVENOUS

## 2016-02-15 MED ORDER — ROCURONIUM BROMIDE 100 MG/10ML IV SOLN
INTRAVENOUS | Status: DC | PRN
  Administered 2016-02-15: 10 mg via INTRAVENOUS
  Administered 2016-02-15: 40 mg via INTRAVENOUS

## 2016-02-15 MED ORDER — ONDANSETRON HCL 4 MG/2ML IJ SOLN
INTRAMUSCULAR | Status: DC | PRN
  Administered 2016-02-15: 4 mg via INTRAVENOUS

## 2016-02-15 MED ORDER — SODIUM CHLORIDE 0.9 % IV SOLN
INTRAVENOUS | Status: DC | PRN
  Administered 2016-02-15: 7 mL

## 2016-02-15 MED ORDER — MIDAZOLAM HCL 2 MG/2ML IJ SOLN
INTRAMUSCULAR | Status: DC | PRN
  Administered 2016-02-15: 2 mg via INTRAVENOUS

## 2016-02-15 MED ORDER — MEPERIDINE HCL 25 MG/ML IJ SOLN: 6.2500 mg | INTRAMUSCULAR | Status: DC | PRN

## 2016-02-15 MED ORDER — PROPOFOL 10 MG/ML IV BOLUS
INTRAVENOUS | Status: DC | PRN
  Administered 2016-02-15: 40 mg via INTRAVENOUS
  Administered 2016-02-15: 140 mg via INTRAVENOUS

## 2016-02-15 SURGICAL SUPPLY — 38 items
APPLIER CLIP ROT 10 11.4 M/L (STAPLE) ×3
BLADE CLIPPER SURG (BLADE) ×3 IMPLANT
CANISTER SUCT 1200ML W/VALVE (MISCELLANEOUS) ×3 IMPLANT
CANNULA DILATOR 10 W/SLV (CANNULA) ×2 IMPLANT
CANNULA DILATOR 10MM W/SLV (CANNULA) ×1
CATH REDDICK CHOLANGI 4FR 50CM (CATHETERS) ×3 IMPLANT
CHLORAPREP W/TINT 26ML (MISCELLANEOUS) ×3 IMPLANT
CLIP APPLIE ROT 10 11.4 M/L (STAPLE) ×1 IMPLANT
CLOSURE WOUND 1/2 X4 (GAUZE/BANDAGES/DRESSINGS) ×1
DRAPE SHEET LG 3/4 BI-LAMINATE (DRAPES) ×3 IMPLANT
ELECT REM PT RETURN 9FT ADLT (ELECTROSURGICAL) ×3
ELECTRODE REM PT RTRN 9FT ADLT (ELECTROSURGICAL) ×1 IMPLANT
GAUZE SPONGE 4X4 12PLY STRL (GAUZE/BANDAGES/DRESSINGS) ×3 IMPLANT
GLOVE BIO SURGEON STRL SZ7.5 (GLOVE) ×15 IMPLANT
GOWN STRL REUS W/ TWL LRG LVL3 (GOWN DISPOSABLE) ×3 IMPLANT
GOWN STRL REUS W/TWL LRG LVL3 (GOWN DISPOSABLE) ×6
IRRIGATION STRYKERFLOW (MISCELLANEOUS) ×1 IMPLANT
IRRIGATOR STRYKERFLOW (MISCELLANEOUS) ×3
IV NS 1000ML (IV SOLUTION) ×2
IV NS 1000ML BAXH (IV SOLUTION) ×1 IMPLANT
KIT RM TURNOVER STRD PROC AR (KITS) ×3 IMPLANT
LABEL OR SOLS (LABEL) ×3 IMPLANT
NDL INSUFF ACCESS 14 VERSASTEP (NEEDLE) ×3 IMPLANT
NEEDLE FILTER BLUNT 18X 1/2SAF (NEEDLE) ×2
NEEDLE FILTER BLUNT 18X1 1/2 (NEEDLE) ×1 IMPLANT
NS IRRIG 500ML POUR BTL (IV SOLUTION) ×3 IMPLANT
PACK LAP CHOLECYSTECTOMY (MISCELLANEOUS) ×3 IMPLANT
SCISSORS METZENBAUM CVD 33 (INSTRUMENTS) ×3 IMPLANT
SEAL FOR SCOPE WARMER C3101 (MISCELLANEOUS) IMPLANT
SLEEVE ENDOPATH XCEL 5M (ENDOMECHANICALS) ×3 IMPLANT
STRIP CLOSURE SKIN 1/2X4 (GAUZE/BANDAGES/DRESSINGS) ×2 IMPLANT
SUT CHROMIC 5 0 RB 1 27 (SUTURE) ×6 IMPLANT
SUT VIC AB 0 CT2 27 (SUTURE) IMPLANT
SYR 3ML LL SCALE MARK (SYRINGE) ×3 IMPLANT
TROCAR XCEL NON-BLD 11X100MML (ENDOMECHANICALS) ×3 IMPLANT
TROCAR XCEL NON-BLD 5MMX100MML (ENDOMECHANICALS) ×3 IMPLANT
TUBING INSUFFLATOR HI FLOW (MISCELLANEOUS) ×3 IMPLANT
WATER STERILE IRR 1000ML POUR (IV SOLUTION) IMPLANT

## 2016-02-15 NOTE — Anesthesia Postprocedure Evaluation (Signed)
Anesthesia Post Note  Patient: KANON NOVOSEL  Procedure(s) Performed: Procedure(s) (LRB): LAPAROSCOPIC CHOLECYSTECTOMY WITH INTRAOPERATIVE CHOLANGIOGRAM (N/A)  Patient location during evaluation: PACU Anesthesia Type: General Level of consciousness: awake and alert and oriented Pain management: pain level controlled Vital Signs Assessment: post-procedure vital signs reviewed and stable Respiratory status: spontaneous breathing, nonlabored ventilation and respiratory function stable Cardiovascular status: blood pressure returned to baseline and stable Postop Assessment: no signs of nausea or vomiting Anesthetic complications: no    Last Vitals:  Vitals:   02/15/16 1323 02/15/16 1342  BP: 128/82 (!) 145/77  Pulse: 64 66  Resp: 13 14  Temp:  36.5 C    Last Pain:  Vitals:   02/15/16 1342  TempSrc: Oral  PainSc: 3                  Marranda Arakelian

## 2016-02-15 NOTE — Anesthesia Procedure Notes (Signed)
Procedure Name: Intubation Performed by: Verbena Boeding Pre-anesthesia Checklist: Patient identified, Patient being monitored, Timeout performed, Emergency Drugs available and Suction available Patient Re-evaluated:Patient Re-evaluated prior to inductionOxygen Delivery Method: Circle system utilized Preoxygenation: Pre-oxygenation with 100% oxygen Intubation Type: IV induction Ventilation: Mask ventilation without difficulty Laryngoscope Size: Mac and 3 Grade View: Grade II Tube type: Oral Tube size: 7.5 mm Number of attempts: 1 Airway Equipment and Method: Stylet Placement Confirmation: ETT inserted through vocal cords under direct vision,  positive ETCO2 and breath sounds checked- equal and bilateral Secured at: 22 cm Tube secured with: Tape Dental Injury: Teeth and Oropharynx as per pre-operative assessment        

## 2016-02-15 NOTE — Telephone Encounter (Signed)
-----   Message from Imogene Burn, PA-C sent at 02/14/2016  8:01 AM EDT ----- Normal stress test but blood pressure high with exercise. F/u with primary care for HTN

## 2016-02-15 NOTE — Discharge Instructions (Addendum)
Take Tylenol or Norco if needed for pain.  Should not drive or do anything dangerous when taking Norco.  Resume aspirin on Saturday.  Remove dressings on Saturday. May shower Sunday.  Avoid straining and heavy lifting for the first week after surgery.   Laparoscopic Cholecystectomy, Care After Refer to this sheet in the next few weeks. These instructions provide you with information about caring for yourself after your procedure. Your health care provider may also give you more specific instructions. Your treatment has been planned according to current medical practices, but problems sometimes occur. Call your health care provider if you have any problems or questions after your procedure. WHAT TO EXPECT AFTER THE PROCEDURE After your procedure, it is common to have:  Pain at your incision sites. You will be given pain medicines to control your pain.  Mild nausea or vomiting. This should improve after the first 24 hours.  Bloating and possible shoulder pain from the gas that was used during the procedure. This will improve after the first 24 hours. HOME CARE INSTRUCTIONS Incision Care  Follow instructions from your health care provider about how to take care of your incisions. Make sure you:  Wash your hands with soap and water before you change your bandage (dressing). If soap and water are not available, use hand sanitizer.  Change your dressing as told by your health care provider.  Leave stitches (sutures), skin glue, or adhesive strips in place. These skin closures may need to be in place for 2 weeks or longer. If adhesive strip edges start to loosen and curl up, you may trim the loose edges. Do not remove adhesive strips completely unless your health care provider tells you to do that.  Do not take baths, swim, or use a hot tub until your health care provider approves. Ask your health care provider if you can take showers. You may only be allowed to take sponge baths for  bathing. General Instructions  Take over-the-counter and prescription medicines only as told by your health care provider.  Do not drive or operate heavy machinery while taking prescription pain medicine.  Return to your normal diet as told by your health care provider.  Do not lift anything that is heavier than 10 lb (4.5 kg).  Do not play contact sports for one week or until your health care provider approves. SEEK MEDICAL CARE IF:   You have redness, swelling, or pain at the site of your incision.  You have fluid, blood, or pus coming from your incision.  You notice a bad smell coming from your incision area.  Your surgical incisions break open.  You have a fever. SEEK IMMEDIATE MEDICAL CARE IF:  You develop a rash.  You have difficulty breathing.  You have chest pain.  You have increasing pain in your shoulders (shoulder strap areas).  You faint or have dizzy episodes while you are standing.  You have severe pain in your abdomen.  You have nausea or vomiting that lasts for more than one day.   This information is not intended to replace advice given to you by your health care provider. Make sure you discuss any questions you have with your health care provider.   Document Released: 04/10/2005 Document Revised: 12/30/2014 Document Reviewed: 11/20/2012 Elsevier Interactive Patient Education [employer].

## 2016-02-15 NOTE — H&P (Signed)
  He comes today for lap cholecystectomy with hx epigastric pains and gallstones.  He had cardiac stress test last week and we received word from his cardiologist Dr Burt Knack that he is cleared for surgery.  He reports no change in condition since office visit.  I discussed the plan for surgery.

## 2016-02-15 NOTE — Telephone Encounter (Signed)
Returned pts call and lmptb 02/15/16

## 2016-02-15 NOTE — Anesthesia Preprocedure Evaluation (Signed)
Anesthesia Evaluation  Patient identified by MRN, date of birth, ID band Patient awake    Reviewed: Allergy & Precautions, NPO status , Patient's Chart, lab work & pertinent test results, reviewed documented beta blocker date and time   History of Anesthesia Complications Negative for: history of anesthetic complications  Airway Mallampati: II  TM Distance: >3 FB Neck ROM: Full    Dental no notable dental hx.    Pulmonary asthma , neg sleep apnea, COPD,  COPD inhaler, former smoker,    breath sounds clear to auscultation- rhonchi (-) wheezing      Cardiovascular Exercise Tolerance: Good hypertension, Pt. on medications and Pt. on home beta blockers + CAD (04/2014 anterolateral STEMI/PCI: LM nl, LAD 58m(2.75x16 Promus DES, D1 50p, LCX nl, RI nl, OM1 100 small/chronic, RCA dominant, 20-368m7028m(FFR 0.90->Med Rx), RPDA 50, RPLA 70, EF 60%.), + Past MI and + Cardiac Stents   Rhythm:Regular Rate:Normal - Systolic murmurs and - Diastolic murmurs NM stress test 02/10/16:  Blood pressure demonstrated a hypertensive response to exercise.  No T wave inversion was noted during stress.  The study is normal.  This is a low risk study.  The left ventricular ejection fraction is normal (55-65%).   Normal resting and stress perfusion. No ischemia or infarction EF 60%   Neuro/Psych negative neurological ROS  negative psych ROS   GI/Hepatic Neg liver ROS, GERD  ,  Endo/Other  negative endocrine ROSneg diabetes  Renal/GU negative Renal ROS     Musculoskeletal negative musculoskeletal ROS (+)   Abdominal (+) - obese,   Peds  Hematology negative hematology ROS (+)   Anesthesia Other Findings Past Medical History: No date: Asthma No date: BPH (benign prostatic hyperplasia) No date: CAD (coronary artery disease)     Comment: a. 04/2014 anterolateral STEMI/PCI: LM nl, LAD               73m90m75x16 Promus DES, D1 50p, LCX nl,  RI nl,              OM1 100 small/chronic, RCA dominant, 20-60m,73m           63m/d87mR 0.90->Med Rx), RPDA 50, RPLA 70, EF               60%. No date: COPD (chronic obstructive pulmonary disease) (* No date: Former tobacco use No date: Gastroesophageal reflux disease No date: HTN (hypertension) No date: Hyperlipidemia No date: Hypokalemia 04/2014: Myocardial infarction No date: Stented coronary artery   Reproductive/Obstetrics                             Anesthesia Physical Anesthesia Plan  ASA: III  Anesthesia Plan: General   Post-op Pain Management:    Induction: Intravenous  Airway Management Planned: Oral ETT  Additional Equipment:   Intra-op Plan:   Post-operative Plan: Extubation in OR  Informed Consent: I have reviewed the patients History and Physical, chart, labs and discussed the procedure including the risks, benefits and alternatives for the proposed anesthesia with the patient or authorized representative who has indicated his/her understanding and acceptance.   Dental advisory given  Plan Discussed with: CRNA and Anesthesiologist  Anesthesia Plan Comments:         Anesthesia Quick Evaluation

## 2016-02-15 NOTE — Telephone Encounter (Signed)
Pt aware of his stress test results and he has been advised to f/u with his pcp re: HTN. Pt agreeable with this plan and verbalized understanding.

## 2016-02-15 NOTE — Transfer of Care (Signed)
Immediate Anesthesia Transfer of Care Note  Patient: Timothy Yoder  Procedure(s) Performed: Procedure(s): LAPAROSCOPIC CHOLECYSTECTOMY WITH INTRAOPERATIVE CHOLANGIOGRAM (N/A)  Patient Location: PACU  Anesthesia Type:General  Level of Consciousness: sedated and responds to stimulation  Airway & Oxygen Therapy: Patient Spontanous Breathing and Patient connected to face mask oxygen  Post-op Assessment: Report given to RN and Post -op Vital signs reviewed and stable  Post vital signs: Reviewed and stable  Last Vitals:  Vitals:   02/15/16 1208 02/15/16 1209  BP: (!) 150/106 (!) 150/106  Pulse: 85 79  Resp: 15   Temp: (!) 36 C     Last Pain:  Vitals:   02/15/16 0911  TempSrc: Oral         Complications: No apparent anesthesia complications

## 2016-02-15 NOTE — Op Note (Signed)
OPERATIVE REPORT  PREOPERATIVE DIAGNOSIS:  Chronic cholecystitis cholelithiasis  POSTOPERATIVE DIAGNOSIS: Chronic cholecystitis cholelithiasis  PROCEDURE: Laparoscopic cholecystectomy with cholangiogram  ANESTHESIA: General  SURGEON: Rochel Brome M.D.  INDICATIONS: He has a history of right upper quadrant pains and also findings of gallstones on ultrasound. Surgery was recommended for definitive treatment    With the patient on the operating table in the supine position under general endotracheal anesthesia the abdomen was prepared with ChloraPrep solution and draped in a sterile manner. A short incision was made in the inferior aspect of the umbilicus and carried down to the deep fascia which was grasped with a laryngeal hook. A Veress needle was inserted aspirated and irrigated with a saline solution. The peritoneal cavity was insufflated with carbon dioxide. The Veress needle was removed. The 10 mm cannula was inserted. The 10 mm 0 laparoscope was inserted to view the peritoneal cavity.  Another incision was made in the epigastrium slightly to the right of the midline to introduce an 11 mm cannula. 2 incisions were made in the lateral aspect of the right upper quadrant to introduce 2   5 mm cannulas. The surface of the liver appeared normal. Brief survey of the abdomen revealed no other abnormalities. The gallbladder was retracted towards the right shoulder.  The gallbladder neck was retracted inferiorly and laterally.  The porta hepatis was identified. The gallbladder was mobilized with incision of the visceral peritoneum. The cystic duct was dissected free from surrounding structures. The cystic artery was dissected free from surrounding structures. A critical view of safety was demonstrated  An Endo Clip was placed across the cystic duct adjacent to the gallbladder neck. An incision was made in the cystic duct to introduce a Reddick catheter. The cholangiogram was done with injection of  half-strength Conray 60 dye. This demonstrated the bile ducts and flow of dye into the duodenum. No retained stones were identified. The cholangiogram appeared normal. The Reddick catheter was removed. The cystic duct was doubly ligated with endoclips and divided. The cystic artery was controlled with double endoclips and divided. The gallbladder was dissected free from the liver with use of hook and cautery and blunt dissection. Bleeding was minimal and hemostasis was intact. The gallbladder was delivered up through the infraumbilical incision opened and suctioned.  There was some sludge within the gallbladder with tiny stones. The gallbladder was removed and submitted in formalin for routine pathology. The right upper quadrant was further inspected and was irrigated and aspirated. Hemostasis was intact. The cannulas were removed Sann no bleeding from the cannula sites. The skin incisions were closed with interrupted 5-0 chromic subcutaneous suture benzoin and Steri-Strips. Gauze dressings were applied with paper tape.  The patient appeared to be in satisfactory condition and was prepared for transfer to the recovery room  [place].D.

## 2016-02-15 NOTE — Telephone Encounter (Signed)
F/u Message ° °Pt returning RN call. Please call back to discuss  °

## 2016-02-16 LAB — SURGICAL PATHOLOGY

## 2016-02-18 ENCOUNTER — Telehealth: Payer: Self-pay | Admitting: Physician Assistant

## 2016-02-18 NOTE — Telephone Encounter (Signed)
PER PT  HAS   APPT IN January WAS  WANTING TO KNOW IF   WAS  NECESSARY TO  KEEP  November  APPT   MICHELE  SAW PT  FOR PRE  OP CLEARANCE   AND  ORDERED  MYOVIEW WHICH WAS  OKAY WILL SEND TO   DR   Burt Knack FOR  RECOMMENDATIONS .Adonis Housekeeper

## 2016-02-18 NOTE — Telephone Encounter (Signed)
Pt wants to know since test results can back fine if follow visit is needed .

## 2016-02-20 NOTE — Telephone Encounter (Signed)
He should keep January appt. No need to return in November as long as he's doing well.

## 2016-02-21 NOTE — Telephone Encounter (Signed)
I spoke with the pt and made him aware that November appointment is not needed per Dr Burt Knack.  The pt will keep January appointment.

## 2016-02-23 ENCOUNTER — Encounter: Payer: Self-pay | Admitting: Physician Assistant

## 2016-02-24 ENCOUNTER — Other Ambulatory Visit: Payer: Self-pay

## 2016-02-24 MED ORDER — CARVEDILOL 3.125 MG PO TABS: 3.1250 mg | ORAL_TABLET | Freq: Two times a day (BID) | ORAL | 3 refills | Status: DC

## 2016-02-28 ENCOUNTER — Ambulatory Visit: Payer: BLUE CROSS/BLUE SHIELD | Admitting: Physician Assistant

## 2016-04-06 DIAGNOSIS — I2510 Atherosclerotic heart disease of native coronary artery without angina pectoris: Secondary | ICD-10-CM | POA: Insufficient documentation

## 2016-05-04 ENCOUNTER — Encounter: Payer: Self-pay | Admitting: Cardiovascular Disease

## 2016-05-04 ENCOUNTER — Ambulatory Visit (INDEPENDENT_AMBULATORY_CARE_PROVIDER_SITE_OTHER): Payer: BLUE CROSS/BLUE SHIELD | Admitting: Cardiovascular Disease

## 2016-05-04 VITALS — BP 128/90 | HR 72 | Ht 68.0 in | Wt 167.6 lb

## 2016-05-04 DIAGNOSIS — I2510 Atherosclerotic heart disease of native coronary artery without angina pectoris: Secondary | ICD-10-CM | POA: Diagnosis not present

## 2016-05-04 DIAGNOSIS — E785 Hyperlipidemia, unspecified: Secondary | ICD-10-CM

## 2016-05-04 NOTE — Patient Instructions (Signed)

## 2016-05-04 NOTE — Progress Notes (Signed)
Cardiology Office Note Date:  05/04/2016   ID:  Timothy Yoder, DOB 1955/08/23, MRN [id number]  PCP:  Madelyn Brunner, MD  Cardiologist:  Sherren Mocha, MD    Chief Complaint  Patient presents with  . Coronary Artery Disease     History of Present Illness: Timothy Yoder is a 61 y.o. male who presents for  follow-up of coronary artery disease. The patient initially presented in January 2016 with an acute anterolateral STEMI. He was noted to have severe stenosis of the mid LAD and was treated with drug-eluting stent platform. His LVEF was normal at 60%. He had a moderate 70% lesion in the right coronary artery and underwent pressure wire analysis which was negative for ischemia. Ongoing medical therapy was recommended.  Since his last visit here he underwent cholecystectomy. His postoperative course was uncomplicated.  The patient is here alone today. He feels well and denies any exertional chest pain or pressure. He denies edema, heart palpitations, orthopnea, PND, lightheadedness, or syncope. Notes he has gained 5 or 10 pounds over the last year. Still has mild shortness of breath with activity. He has been diagnosed with COPD. States that symptoms are improved with medical therapy.  Past Medical History:  Diagnosis Date  . Asthma   . BPH (benign prostatic hyperplasia)   . CAD (coronary artery disease)    a. 04/2014 anterolateral STEMI/PCI: LM nl, LAD 78m(2.75x16 Promus DES, D1 50p, LCX nl, RI nl, OM1 100 small/chronic, RCA dominant, 20-332m7098m(FFR 0.90->Med Rx), RPDA 50, RPLA 70, EF 60%.  . CMarland KitchenPD (chronic obstructive pulmonary disease) ([facility] . Former tobacco use   . Gastroesophageal reflux disease   . HTN (hypertension)   . Hyperlipidemia   . Hypokalemia   . Myocardial infarction 04/2014  . Stented coronary artery     Past Surgical History:  Procedure Laterality Date  . CARDIAC CATHETERIZATION    . CHOLECYSTECTOMY N/A 02/15/2016   Procedure: LAPAROSCOPIC  CHOLECYSTECTOMY WITH INTRAOPERATIVE CHOLANGIOGRAM;  Surgeon: JarLeonie GreenD;  Location: [facility] ORS;  Service: General;  Laterality: N/A;  . COLONOSCOPY WITH PROPOFOL N/A 08/02/2015   Procedure: COLONOSCOPY WITH PROPOFOL;  Surgeon: RobManya SilvasD;  [facility];  Service: Endoscopy;  Laterality: N/A;  . ESOPHAGOGASTRODUODENOSCOPY (EGD) WITH PROPOFOL N/A 08/02/2015   Procedure: ESOPHAGOGASTRODUODENOSCOPY (EGD) WITH PROPOFOL;  Surgeon: RobManya SilvasD;  [facility];  Service: Endoscopy;  Laterality: N/A;  . KNEE ARTHROSCOPY Left   . LEFT HEART CATHETERIZATION WITH CORONARY ANGIOGRAM N/A 05/03/2014   Procedure: LEFT HEART CATHETERIZATION WITH CORONARY ANGIOGRAM;  Surgeon: MicBlane OharaD;  [facility];  Service: Cardiovascular;  Laterality: N/A;  . PERCUTANEOUS CORONARY STENT INTERVENTION (PCI-S) N/A 05/05/2014   Procedure: PERCUTANEOUS CORONARY STENT INTERVENTION (PCI-S);  Surgeon: Peter M JorMartiniqueD;  [facility];  Service: Cardiovascular;  Laterality: N/A;    Current Outpatient Prescriptions  Medication Sig Dispense Refill  . albuterol (PROAIR HFA) 108 (90 Base) MCG/ACT inhaler Inhale 2 puffs into the lungs every 6 (six) hours as needed for wheezing or shortness of breath.     . aMarland Kitchenpirin 81 MG chewable tablet Chew 1 tablet (81 mg total) by mouth daily.    . aMarland Kitchenorvastatin (LIPITOR) 80 MG tablet Take 1 tablet (80 mg total) by mouth daily at 6 PM. 90 tablet 2  . carvedilol (COREG) 3.125 MG tablet Take 1 tablet (3.125 mg total) by mouth 2 (two) times daily. 180 tablet 3  .  lisinopril (PRINIVIL,ZESTRIL) 10 MG tablet Take 1 tablet (10 mg total) by mouth daily. (Patient taking differently: Take 10 mg by mouth every morning. ) 90 tablet 3  . nitroGLYCERIN (NITROSTAT) 0.4 MG SL tablet Place 0.4 mg under the tongue. X 3 doses    . omeprazole (PRILOSEC) 40 MG capsule Take 40 mg by mouth 2 (two) times daily.     . potassium chloride SA  (K-DUR,KLOR-CON) 20 MEQ tablet Take 1 tablet (20 mEq total) by mouth daily. 90 tablet 3  . tamsulosin (FLOMAX) 0.4 MG CAPS capsule Take 0.4 mg by mouth daily after supper.    . umeclidinium-vilanterol (ANORO ELLIPTA) 62.5-25 MCG/INH AEPB Inhale 1 puff into the lungs every morning.     No current facility-administered medications for this visit.     Allergies:   Patient has no known allergies.   Social History:  The patient  reports that he quit smoking about 5 years ago. His smoking use included Cigarettes. He has a 35.00 pack-year smoking history. He has never used smokeless tobacco. He reports that he does not drink alcohol or use drugs.   Family History:  The patient's  family history includes CAD (age of onset: 41) in his father; CAD (age of onset: 93) in his mother.    ROS:  Please see the history of present illness.  Otherwise, review of systems is positive for easy bruising.  All other systems are reviewed and negative.    PHYSICAL EXAM: VS:  BP 128/90   Pulse 72   Ht '5\' 8"'$  (1.727 m)   Wt 167 lb 9.6 oz (76 kg)   SpO2 96%   BMI 25.48 kg/m  , BMI Body mass index is 25.48 kg/m. GEN: Well nourished, well developed, in no acute distress  HEENT: normal  Neck: no JVD, no masses. No carotid bruits Cardiac: RRR without murmur or gallop                Respiratory:  clear to auscultation bilaterally, normal work of breathing GI: soft, nontender, nondistended, + BS MS: no deformity or atrophy  Ext: no pretibial edema, pedal pulses 2+= bilaterally Skin: warm and dry, no rash Neuro:  Strength and sensation are intact Psych: euthymic mood, full affect  EKG:  EKG is not ordered today.  Recent Labs: 08/12/2015: ALT 33; BUN 10; Creatinine, Ser 1.11; Potassium 4.8; Sodium 140   Lipid Panel     Component Value Date/Time   CHOL 104 08/12/2015 0827   TRIG 55 08/12/2015 0827   HDL 35 (L) 08/12/2015 0827   CHOLHDL 3.0 08/12/2015 0827   CHOLHDL 3 07/31/2014 0736   VLDL 15.2 07/31/2014  0736   LDLCALC 58 08/12/2015 0827      Wt Readings from Last 3 Encounters:  05/04/16 167 lb 9.6 oz (76 kg)  02/15/16 165 lb (74.8 kg)  02/08/16 170 lb (77.1 kg)     ASSESSMENT AND PLAN: 1.  CAD, native vessel, without angina. The patient is stable on his current medical program. She is on beta blocker, ACE inhibitor, aspirin, and a statin drug. He will follow-up in one year.  2. Hyperlipidemia: Most recent lipids reviewed demonstrating an LDL cholesterol of 58 mg/dL. The patient is on a high intensity statin drug. He follows regularly with Dr. Gilford Rile and will defer further lab monitoring to him.  3. Essential hypertension: The patient continues on carvedilol and lisinopril. Will continue his current medications without change. We discussed sodium restriction, weight loss strategies, increased exercise, and  dietary changes.  Overall Mr. Leoni seems to be doing very well from a cardiac perspective and I will see him back in one year for follow-up.  Current medicines are reviewed with the patient today.  The patient does not have concerns regarding medicines.  Labs/ tests ordered today include:  No orders of the defined types were placed in this encounter.   Disposition:   FU one year  Signed, Sherren Mocha, MD  05/04/2016 11:30 AM    [facility] [place], [place], [place]  [address] Phone: [phone]; Fax: [phone]

## 2016-06-15 ENCOUNTER — Other Ambulatory Visit: Payer: Self-pay | Admitting: Nurse Practitioner

## 2016-12-23 ENCOUNTER — Other Ambulatory Visit: Payer: Self-pay | Admitting: Physician Assistant

## 2017-02-27 ENCOUNTER — Other Ambulatory Visit: Payer: Self-pay | Admitting: Specialist

## 2017-02-27 DIAGNOSIS — J449 Chronic obstructive pulmonary disease, unspecified: Secondary | ICD-10-CM

## 2017-03-02 ENCOUNTER — Other Ambulatory Visit: Payer: Self-pay | Admitting: Physician Assistant

## 2017-03-06 ENCOUNTER — Ambulatory Visit
Admission: RE | Admit: 2017-03-06 | Discharge: 2017-03-06 | Disposition: A | Discharge status: Home / Self Care | Payer: BLUE CROSS/BLUE SHIELD | Source: Ambulatory Visit | Attending: Specialist | Admitting: Specialist

## 2017-03-06 ENCOUNTER — Ambulatory Visit
Admission: RE | Admit: 2017-03-06 | Discharge: 2017-03-06 | Disposition: A | Discharge status: Home / Self Care | Payer: BLUE CROSS/BLUE SHIELD | Source: Ambulatory Visit | Attending: Nurse Practitioner | Admitting: Nurse Practitioner

## 2017-03-06 ENCOUNTER — Telehealth: Payer: Self-pay | Admitting: *Deleted

## 2017-03-06 ENCOUNTER — Inpatient Hospital Stay: Payer: BLUE CROSS/BLUE SHIELD | Attending: Nurse Practitioner | Admitting: Nurse Practitioner

## 2017-03-06 DIAGNOSIS — I700 Atherosclerosis of aorta: Secondary | ICD-10-CM | POA: Diagnosis not present

## 2017-03-06 DIAGNOSIS — Z122 Encounter for screening for malignant neoplasm of respiratory organs: Secondary | ICD-10-CM

## 2017-03-06 DIAGNOSIS — J439 Emphysema, unspecified: Secondary | ICD-10-CM | POA: Diagnosis not present

## 2017-03-06 DIAGNOSIS — Z87891 Personal history of nicotine dependence: Secondary | ICD-10-CM

## 2017-03-06 DIAGNOSIS — J449 Chronic obstructive pulmonary disease, unspecified: Secondary | ICD-10-CM

## 2017-03-06 NOTE — Telephone Encounter (Signed)
Received referral for initial lung cancer screening scan. Contacted patient and obtained smoking history,(former, quit 2012, 40 pack year) as well as answering questions related to screening process. Patient denies signs of lung cancer such as weight loss or hemoptysis. Patient denies comorbidity that would prevent curative treatment if lung cancer were found. Patient is scheduled for shared decision making visit and CT scan on 03/06/17.

## 2017-03-07 NOTE — Progress Notes (Signed)
In accordance with CMS guidelines, patient has met eligibility criteria including age, absence of signs or symptoms of lung cancer.  Social History   Tobacco Use  . Smoking status: Former Smoker    Packs/day: 1.00    Years: 35.00    Pack years: 35.00    Types: Cigarettes    Last attempt to quit: 02/14/2011    Years since quitting: 6.0  . Smokeless tobacco: Never Used  Substance Use Topics  . Alcohol use: No  . Drug use: No     A shared decision-making session was conducted prior to the performance of CT scan. This includes one or more decision aids, includes benefits and harms of screening, follow-up diagnostic testing, over-diagnosis, false positive rate, and total radiation exposure.  Counseling on the importance of adherence to annual lung cancer LDCT screening, impact of co-morbidities, and ability or willingness to undergo diagnosis and treatment is imperative for compliance of the program.  Counseling on the importance of continued smoking cessation for former smokers; the importance of smoking cessation for current smokers, and information about tobacco cessation interventions have been given to patient including [place] and [place] quit [place] programs.  Written order for lung cancer screening with LDCT has been given to the patient and any and all questions have been answered to the best of my abilities.   Yearly follow up will be coordinated by Burgess Estelle, Thoracic Navigator.  Beckey Rutter, DNP, AGNP-C 03/07/17 3:51 PM

## 2017-03-09 ENCOUNTER — Encounter: Payer: Self-pay | Admitting: *Deleted

## 2017-03-27 DIAGNOSIS — R197 Diarrhea, unspecified: Secondary | ICD-10-CM | POA: Insufficient documentation

## 2017-04-30 ENCOUNTER — Other Ambulatory Visit: Payer: Self-pay | Admitting: Physician Assistant

## 2017-06-18 ENCOUNTER — Encounter: Payer: Self-pay | Admitting: Cardiovascular Disease

## 2017-06-18 ENCOUNTER — Ambulatory Visit (INDEPENDENT_AMBULATORY_CARE_PROVIDER_SITE_OTHER): Payer: BLUE CROSS/BLUE SHIELD | Admitting: Cardiovascular Disease

## 2017-06-18 VITALS — BP 138/90 | HR 67 | Ht 66.0 in | Wt 163.8 lb

## 2017-06-18 DIAGNOSIS — I2510 Atherosclerotic heart disease of native coronary artery without angina pectoris: Secondary | ICD-10-CM | POA: Diagnosis not present

## 2017-06-18 DIAGNOSIS — I10 Essential (primary) hypertension: Secondary | ICD-10-CM

## 2017-06-18 MED ORDER — ATORVASTATIN CALCIUM 80 MG PO TABS: 80.0000 mg | ORAL_TABLET | Freq: Every day | ORAL | 3 refills | Status: DC

## 2017-06-18 MED ORDER — LISINOPRIL 10 MG PO TABS: 10.0000 mg | ORAL_TABLET | Freq: Every day | ORAL | 3 refills | Status: DC

## 2017-06-18 MED ORDER — CARVEDILOL 6.25 MG PO TABS: 6.2500 mg | ORAL_TABLET | Freq: Two times a day (BID) | ORAL | 3 refills | Status: DC

## 2017-06-18 MED ORDER — NITROGLYCERIN 0.4 MG SL SUBL: 0.4000 mg | SUBLINGUAL_TABLET | SUBLINGUAL | 3 refills | Status: DC | PRN

## 2017-06-18 NOTE — Progress Notes (Signed)
Cardiology Office Note Date:  06/18/2017   ID:  Gay, Rape 01/13/1956, MRN [id number]  PCP:  Madelyn Brunner, MD  Cardiologist:  Sherren Mocha, MD    Chief Complaint  Patient presents with  . Hyperlipidemia, unspecified hyperlipidemia type     History of Present Illness: Timothy Yoder is a 62 y.o. male who presents for follow-up of coronary artery disease. The patient initially presented in 2016 with an acute anterolateral STEMI. He was noted to have severe stenosis of the mid LAD and was treated with drug-eluting stent platform. His LVEF was normal at 60%. He had a moderate 70% lesion in the right coronary artery and underwent pressure wire analysis which was negative for ischemia. Ongoing medical therapy was recommended.  Timothy Yoder is here alone today. He's now retired from his job.  He is doing well from a symptomatic perspective.  He denies any chest pain, chest pressure, shortness of breath, leg swelling, or heart palpitations.  Past Medical History:  Diagnosis Date  . Asthma   . BPH (benign prostatic hyperplasia)   . CAD (coronary artery disease)    a. 04/2014 anterolateral STEMI/PCI: LM nl, LAD 107m (2.75x16 Promus DES, D1 50p, LCX nl, RI nl, OM1 100 small/chronic, RCA dominant, 20-27m, 44m/d (FFR 0.90->Med Rx), RPDA 50, RPLA 70, EF 60%.  Marland Kitchen COPD (chronic obstructive pulmonary disease) ([place])   . Former tobacco use   . Gastroesophageal reflux disease   . HTN (hypertension)   . Hyperlipidemia   . Hypokalemia   . Myocardial infarction ([place]) 04/2014  . Stented coronary artery     Past Surgical History:  Procedure Laterality Date  . CARDIAC CATHETERIZATION    . CHOLECYSTECTOMY N/A 02/15/2016   Procedure: LAPAROSCOPIC CHOLECYSTECTOMY WITH INTRAOPERATIVE CHOLANGIOGRAM;  Surgeon: Leonie Green, MD;  Location: [facility] ORS;  Service: General;  Laterality: N/A;  . COLONOSCOPY WITH PROPOFOL N/A 08/02/2015   Procedure: COLONOSCOPY WITH PROPOFOL;  Surgeon: Manya Silvas, MD;  [facility];  Service: Endoscopy;  Laterality: N/A;  . ESOPHAGOGASTRODUODENOSCOPY (EGD) WITH PROPOFOL N/A 08/02/2015   Procedure: ESOPHAGOGASTRODUODENOSCOPY (EGD) WITH PROPOFOL;  Surgeon: Manya Silvas, MD;  Location[facility]COPY;  Service: Endoscopy;  Laterality: N/A;  . KNEE ARTHROSCOPY Left   . LEFT HEART CATHETERIZATION WITH CORONARY ANGIOGRAM N/A 05/03/2014   Procedure: LEFT HEART CATHETERIZATION WITH CORONARY ANGIOGRAM;  Surgeon: Blane Ohara, MD;  [facility];  Service: Cardiovascular;  Laterality: N/A;  . PERCUTANEOUS CORONARY STENT INTERVENTION (PCI-S) N/A 05/05/2014   Procedure: PERCUTANEOUS CORONARY STENT INTERVENTION (PCI-S);  Surgeon: Peter M Martinique, MD;  Location: [facility] CATH LAB;  Service: Cardiovascular;  Laterality: N/A;    Current Outpatient Medications  Medication Sig Dispense Refill  . albuterol (PROAIR HFA) 108 (90 Base) MCG/ACT inhaler Inhale 2 puffs into the lungs every 6 (six) hours as needed for wheezing or shortness of breath.     Marland Kitchen aspirin 81 MG chewable tablet Chew 1 tablet (81 mg total) by mouth daily.    . colestipol (COLESTID) 1 g tablet Take 2 g by mouth daily as needed. Stomach issues    . KLOR-CON M20 20 MEQ tablet TAKE 1 TABLET (20 MEQ TOTAL) BY MOUTH DAILY. 90 tablet 2  . omeprazole (PRILOSEC) 40 MG capsule Take 40 mg by mouth 2 (two) times daily.     . ranitidine (ZANTAC) 150 MG tablet Take 150 mg by mouth 2 (two) times daily.    . tamsulosin (FLOMAX) 0.4 MG CAPS  capsule Take 0.4 mg by mouth daily after supper.    . umeclidinium-vilanterol (ANORO ELLIPTA) 62.5-25 MCG/INH AEPB Inhale 1 puff into the lungs every morning.    Marland Kitchen atorvastatin (LIPITOR) 80 MG tablet Take 1 tablet (80 mg total) by mouth daily at 6 PM. 90 tablet 3  . carvedilol (COREG) 6.25 MG tablet Take 1 tablet (6.25 mg total) by mouth 2 (two) times daily. 180 tablet 3  . lisinopril (PRINIVIL,ZESTRIL) 10 MG tablet Take 1 tablet (10 mg total) by mouth daily.  90 tablet 3  . nitroGLYCERIN (NITROSTAT) 0.4 MG SL tablet Place 1 tablet (0.4 mg total) under the tongue every 5 (five) minutes as needed for chest pain. X 3 doses 25 tablet 3   No current facility-administered medications for this visit.     Allergies:   Patient has no known allergies.   Social History:  The patient  reports that he quit smoking about 6 years ago. His smoking use included cigarettes. He has a 35.00 pack-year smoking history. he has never used smokeless tobacco. He reports that he does not drink alcohol or use drugs.   Family History:  The patient's family history includes CAD (age of onset: 72) in his father; CAD (age of onset: 34) in his mother.    ROS:  Please see the history of present illness.   All other systems are reviewed and negative.   PHYSICAL EXAM: VS:  BP 138/90   Pulse 67   Ht 5\' 6"  (1.676 m)   Wt 163 lb 12.8 oz (74.3 kg)   BMI 26.44 kg/m  , BMI Body mass index is 26.44 kg/m. GEN: Well nourished, well developed, in no acute distress  HEENT: normal  Neck: no JVD, no masses. No carotid bruits Cardiac: RRR without murmur or gallop      Respiratory:  clear to auscultation bilaterally, normal work of breathing GI: soft, nontender, nondistended, + BS MS: no deformity or atrophy  Ext: no pretibial edema, pedal pulses 2+= bilaterally Skin: warm and dry, no rash Neuro:  Strength and sensation are intact Psych: euthymic mood, full affect  EKG:  EKG is ordered today. The ekg ordered today shows NSR 67 bpm, nonspecific ST-T abnormality, wandering baseline  Recent Labs: No results found for requested labs within last 8760 hours.   Lipid Panel     Component Value Date/Time   CHOL 104 08/12/2015 0827   TRIG 55 08/12/2015 0827   HDL 35 (L) 08/12/2015 0827   CHOLHDL 3.0 08/12/2015 0827   CHOLHDL 3 07/31/2014 0736   VLDL 15.2 07/31/2014 0736   LDLCALC 58 08/12/2015 0827      Wt Readings from Last 3 Encounters:  06/18/17 163 lb 12.8 oz (74.3 kg)    03/06/17 165 lb (74.8 kg)  05/04/16 167 lb 9.6 oz (76 kg)    ASSESSMENT AND PLAN: 1.  Coronary artery disease, native vessel, without angina: The patient is doing well on a combination of aspirin, high intensity statin drug, beta-blocker, ACE.  I will see him back in 1 year's problems arise in the interim.  2.  Mixed hyperlipidemia: The patient continues on a high intensity statin drug.  Lipids are followed by his primary care physician.  3.  Hypertension: Blood pressure is treated with carvedilol and lisinopril.  I rechecked his blood pressure and it is the same range at 138/90.  Recommended that he increase his carvedilol to 6.25 mg twice daily and continue on his current dose of lisinopril.  We discussed  diet and lifestyle changes with increasing exercise as well.  Current medicines are reviewed with the patient today.  The patient does not have concerns regarding medicines.  Labs/ tests ordered today include:   Orders Placed This Encounter  Procedures  . EKG 12-Lead    Disposition:   FU one year  Signed, Sherren Mocha, MD  06/18/2017 1:27 PM    [facility] [place], [place], [place]  [address] Phone: [phone]; Fax: [phone]

## 2017-06-18 NOTE — Patient Instructions (Signed)
Medication Instructions:  1) INCREASE COREG to 6.25 mg twice daily  Labwork: None  Testing/Procedures: None  Follow-Up: Your provider wants you to follow-up in: 1 year with Dr. Burt Knack. You will receive a reminder letter in the mail two months in advance. If you don't receive a letter, please call our office to schedule the follow-up appointment.    Any Other Special Instructions Will Be Listed Below (If Applicable).     If you need a refill on your cardiac medications before your next appointment, please call your pharmacy.

## 2017-11-26 ENCOUNTER — Encounter: Payer: Self-pay | Admitting: Cardiovascular Disease

## 2017-11-26 MED ORDER — POTASSIUM CHLORIDE CRYS ER 20 MEQ PO TBCR: 20.0000 meq | EXTENDED_RELEASE_TABLET | Freq: Every day | ORAL | 1 refills | Status: DC

## 2017-11-26 NOTE — Telephone Encounter (Signed)
Pt's medication was sent to pt's pharmacy as requested. Confirmation received.  °

## 2018-02-23 ENCOUNTER — Telehealth: Payer: Self-pay

## 2018-02-23 NOTE — Telephone Encounter (Signed)
Call pt regarding lung screening. Pt would like a Monday or Tuesday morning. Pt is a former smoker.

## 2018-02-25 ENCOUNTER — Encounter: Payer: Self-pay | Admitting: *Deleted

## 2018-02-25 ENCOUNTER — Telehealth: Payer: Self-pay | Admitting: *Deleted

## 2018-02-25 DIAGNOSIS — Z122 Encounter for screening for malignant neoplasm of respiratory organs: Secondary | ICD-10-CM

## 2018-02-25 NOTE — Telephone Encounter (Signed)
Patient has been notified that the annual lung cancer screening low dose CT scan is due currently or will be in the near future.  Confirmed that the patient is within the age range of 75-80, and asymptomatic, and currently exhibits no signs or symptoms of lung cancer.  Patient denies illness that would prevent curative treatment for lung cancer if found.  Verified smoking history, former smoker quit 2012 40pkyr history.  The shared decision making visit was completed on 03-06-17.  Patient is agreeable for the CT scan to be scheduled.  Will call patient back with date and time of appointment.

## 2018-02-26 ENCOUNTER — Telehealth: Payer: Self-pay | Admitting: *Deleted

## 2018-02-26 NOTE — Telephone Encounter (Signed)
Called pt to inform him of his ldct screening appt for Monday 03/11/2018 @ 9:15am here @ [facility], message left on home phone, message left on cell phone and appts mailed to home address per EMR.

## 2018-03-11 ENCOUNTER — Ambulatory Visit
Admission: RE | Admit: 2018-03-11 | Discharge: 2018-03-11 | Disposition: A | Discharge status: Home / Self Care | Payer: BLUE CROSS/BLUE SHIELD | Source: Ambulatory Visit | Attending: Nurse Practitioner | Admitting: Nurse Practitioner

## 2018-03-11 ENCOUNTER — Other Ambulatory Visit: Payer: Self-pay | Admitting: *Deleted

## 2018-03-11 DIAGNOSIS — J432 Centrilobular emphysema: Secondary | ICD-10-CM | POA: Insufficient documentation

## 2018-03-11 DIAGNOSIS — R911 Solitary pulmonary nodule: Secondary | ICD-10-CM | POA: Insufficient documentation

## 2018-03-11 DIAGNOSIS — I700 Atherosclerosis of aorta: Secondary | ICD-10-CM | POA: Insufficient documentation

## 2018-03-11 DIAGNOSIS — I2510 Atherosclerotic heart disease of native coronary artery without angina pectoris: Secondary | ICD-10-CM | POA: Insufficient documentation

## 2018-03-11 DIAGNOSIS — Z87891 Personal history of nicotine dependence: Secondary | ICD-10-CM | POA: Insufficient documentation

## 2018-03-11 DIAGNOSIS — Z122 Encounter for screening for malignant neoplasm of respiratory organs: Secondary | ICD-10-CM | POA: Insufficient documentation

## 2018-03-12 ENCOUNTER — Other Ambulatory Visit: Payer: Self-pay | Admitting: *Deleted

## 2018-03-12 ENCOUNTER — Encounter: Payer: Self-pay | Admitting: *Deleted

## 2018-03-12 ENCOUNTER — Telehealth: Payer: Self-pay | Admitting: *Deleted

## 2018-03-12 DIAGNOSIS — R911 Solitary pulmonary nodule: Secondary | ICD-10-CM

## 2018-03-12 NOTE — Addendum Note (Signed)
Addended by: Telford Nab on: 03/12/2018 10:38 AM   Modules accepted: Orders

## 2018-03-12 NOTE — Addendum Note (Signed)
Addended by: Telford Nab on: 03/12/2018 10:51 AM   Modules accepted: Orders

## 2018-03-12 NOTE — Addendum Note (Signed)
Addended by: Telford Nab on: 03/12/2018 02:38 PM   Modules accepted: Orders

## 2018-03-12 NOTE — Telephone Encounter (Signed)
After discussion with Dr. Gust Brooms nurse(referring provider), regarding lung screening scan results, patient is notified of scan results and plan for PET scan, discussion at Multidisciplinary Thoracic Conference, and follow up with medical oncologist to review results. Patient is agreeable with plan.

## 2018-03-12 NOTE — Progress Notes (Signed)
Timothy Yoder, 62 y.o. male who was referred to the lung cancer screening program/low-dose CT for history of tobacco use. he had a low-dose chest CT on 03/11/2018 which noted a new 2.4 cm right lower lobe pulmonary nodule highly suspicious/concerning for underlying malignancy.  He has been referred to [facility] and discussed at Multidisciplinary Thoracic Conference, he will require PET scan to further evaluate, assist in medical decision-making including possible biopsy, and to optimize management. Patient has been referred to [facility].

## 2018-03-12 NOTE — Progress Notes (Signed)
  Oncology Nurse Navigator Documentation  Navigator Location: [facility]-Med Onc (03/12/18 1400) Referral date to RadOnc/MedOnc: 03/12/18 (03/12/18 1400) )Navigator Encounter Type: Introductory phone call (03/12/18 1400)   Abnormal Finding Date: 03/11/18 (03/12/18 1400)                   Treatment Phase: Abnormal Scans (03/12/18 1400) Barriers/Navigation Needs: Coordination of Care (03/12/18 1400)   Interventions: Coordination of Care (03/12/18 1400)   Coordination of Care: Appts;Radiology (03/12/18 1400)        Acuity: Level 2 (03/12/18 1400)   Acuity Level 2: Initial guidance, education and coordination as needed;Educational needs;Assistance expediting appointments (03/12/18 1400)  Pt introduced to navigator services. All upcoming appts reviewed with patient. Contact info given and instructed to call with any further questions or needs.   Time Spent with Patient: 30 (03/12/18 1400)

## 2018-03-13 ENCOUNTER — Ambulatory Visit (INDEPENDENT_AMBULATORY_CARE_PROVIDER_SITE_OTHER): Payer: BLUE CROSS/BLUE SHIELD | Admitting: Cardiothoracic Surgery

## 2018-03-13 ENCOUNTER — Other Ambulatory Visit: Payer: Self-pay

## 2018-03-13 ENCOUNTER — Encounter: Payer: Self-pay | Admitting: Cardiothoracic Surgery

## 2018-03-13 ENCOUNTER — Encounter
Admission: RE | Admit: 2018-03-13 | Discharge: 2018-03-13 | Disposition: A | Discharge status: Home / Self Care | Payer: BLUE CROSS/BLUE SHIELD | Source: Ambulatory Visit | Attending: Nurse Practitioner | Admitting: Nurse Practitioner

## 2018-03-13 VITALS — BP 148/86 | HR 57 | Temp 97.5°F | Resp 16 | Ht 69.0 in | Wt 165.0 lb

## 2018-03-13 DIAGNOSIS — R918 Other nonspecific abnormal finding of lung field: Secondary | ICD-10-CM

## 2018-03-13 DIAGNOSIS — R911 Solitary pulmonary nodule: Secondary | ICD-10-CM | POA: Diagnosis not present

## 2018-03-13 LAB — GLUCOSE, CAPILLARY: Glucose-Capillary: 67 mg/dL — ABNORMAL LOW (ref 70–99)

## 2018-03-13 MED ORDER — FLUDEOXYGLUCOSE F - 18 (FDG) INJECTION
8.4000 | Freq: Once | INTRAVENOUS | Status: AC | PRN
  Administered 2018-03-13: 8.12 via INTRAVENOUS

## 2018-03-13 NOTE — Patient Instructions (Addendum)
Referral and Appointment with Pulmonary for lymph node biopsy, new lung mass  The patient is aware to call back for any questions or new concerns.

## 2018-03-13 NOTE — Progress Notes (Signed)
Patient ID: Timothy Yoder, male   DOB: 09/02/1955, 62 y.o.   MRN: [id number]  Chief Complaint  Patient presents with  . New Patient (Initial Visit)    new right lung nodule, CT 03-11-18    Referred By Dr. Raul Del Reason for Referral right lower lobe mass  HPI Location, Quality, Duration, Severity, Timing, Context, Modifying Factors, Associated Signs and Symptoms.  Timothy Yoder is a 62 y.o. male.  This patient is a 62 year old white male with a long-standing smoking history who quit approximately 7 years ago.  He has a 40-pack-year smoking history.  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  He did serve in [employer] but does not have any asbestos exposure.  He does have a sister who recently passed away from COPD related complications who is also a smoker.  As part of the lung screening service, he underwent a chest CT last year which was unremarkable and a repeat chest CT this year shows a right lower lobe nodule measuring 2.5 cm with a 1.6 cm subcarinal lymph node.  These were not present 1 year ago.  He is scheduled to have a PET scan done today and a follow-up with Dr. Tasia Catchings in oncology the following day.  He denies any fevers, chills, hemoptysis, weight loss.  He states his appetite is good.  He does get short of breath with moderate exertion.  He is taking low-dose aspirin secondary to stents related to myocardial infarction 3 years ago.   Past Medical History:  Diagnosis Date  . Asthma   . BPH (benign prostatic hyperplasia)   . CAD (coronary artery disease)    a. 04/2014 anterolateral STEMI/PCI: LM nl, LAD 85m (2.75x16 Promus DES, D1 50p, LCX nl, RI nl, OM1 100 small/chronic, RCA dominant, 20-76m, 85m/d (FFR 0.90->Med Rx), RPDA 50, RPLA 70, EF 60%.  Marland Kitchen COPD (chronic obstructive pulmonary disease) (HCC)    Dr Raul Del  . Former tobacco use   . Gastroesophageal reflux disease   . HTN (hypertension)   . Hyperlipidemia   . Hypokalemia   . Myocardial  infarction ([place]) 04/2014   Dr Sherren Mocha  . Stented coronary artery     Past Surgical History:  Procedure Laterality Date  . CARDIAC CATHETERIZATION    . CHOLECYSTECTOMY N/A 02/15/2016   Procedure: LAPAROSCOPIC CHOLECYSTECTOMY WITH INTRAOPERATIVE CHOLANGIOGRAM;  Surgeon: Leonie Green, MD;  Location: [facility] ORS;  Service: General;  Laterality: N/A;  . COLONOSCOPY    . COLONOSCOPY WITH PROPOFOL N/A 08/02/2015   Procedure: COLONOSCOPY WITH PROPOFOL;  Surgeon: Manya Silvas, MD;  Location: [facility] ENDOSCOPY;  Service: Endoscopy;  Laterality: N/A;  . ESOPHAGOGASTRODUODENOSCOPY (EGD) WITH PROPOFOL N/A 08/02/2015   Procedure: ESOPHAGOGASTRODUODENOSCOPY (EGD) WITH PROPOFOL;  Surgeon: Manya Silvas, MD;  Location[facility] ENDOSCOPY;  Service: Endoscopy;  Laterality: N/A;  . KNEE ARTHROSCOPY Left   . LEFT HEART CATHETERIZATION WITH CORONARY ANGIOGRAM N/A 05/03/2014   Procedure: LEFT HEART CATHETERIZATION WITH CORONARY ANGIOGRAM;  Surgeon: Blane Ohara, MD;  Location: [facility] CATH LAB;  Service: Cardiovascular;  Laterality: N/A;  . PERCUTANEOUS CORONARY STENT INTERVENTION (PCI-S) N/A 05/05/2014   Procedure: PERCUTANEOUS CORONARY STENT INTERVENTION (PCI-S);  Surgeon: Peter M Martinique, MD;  Location: [facility] CATH LAB;  Service: Cardiovascular;  Laterality: N/A;    Family History  Problem Relation Age of Onset  . CAD Mother 86       Died of an MI  . CAD Father 65  Died of a massive MI  . COPD Sister   . Colon cancer Neg Hx   . Breast cancer Neg Hx     Social History Social History   Tobacco Use  . Smoking status: Former Smoker    Packs/day: 1.00    Years: 40.00    Pack years: 40.00    Types: Cigarettes    Last attempt to quit: 02/14/2011    Years since quitting: 7.0  . Smokeless tobacco: Never Used  Substance Use Topics  . Alcohol use: No  . Drug use: No    No Known Allergies  Current Outpatient Medications  Medication Sig Dispense Refill  . albuterol (PROAIR HFA) 108 (90  Base) MCG/ACT inhaler Inhale 2 puffs into the lungs every 6 (six) hours as needed for wheezing or shortness of breath.     Marland Kitchen aspirin 81 MG chewable tablet Chew 1 tablet (81 mg total) by mouth daily.    Marland Kitchen atorvastatin (LIPITOR) 80 MG tablet Take 1 tablet (80 mg total) by mouth daily at 6 PM. 90 tablet 3  . carvedilol (COREG) 6.25 MG tablet Take 1 tablet (6.25 mg total) by mouth 2 (two) times daily. 180 tablet 3  . colestipol (COLESTID) 1 g tablet Take 2 g by mouth daily as needed. Stomach issues    . lisinopril (PRINIVIL,ZESTRIL) 10 MG tablet Take 1 tablet (10 mg total) by mouth daily. 90 tablet 3  . nitroGLYCERIN (NITROSTAT) 0.4 MG SL tablet Place 1 tablet (0.4 mg total) under the tongue every 5 (five) minutes as needed for chest pain. X 3 doses 25 tablet 3  . omeprazole (PRILOSEC) 40 MG capsule Take 40 mg by mouth 2 (two) times daily.     . potassium chloride SA (KLOR-CON M20) 20 MEQ tablet Take 1 tablet (20 mEq total) by mouth daily. 90 tablet 1  . ranitidine (ZANTAC) 150 MG tablet Take 150 mg by mouth 2 (two) times daily.    . tamsulosin (FLOMAX) 0.4 MG CAPS capsule Take 0.4 mg by mouth daily after supper.    . umeclidinium-vilanterol (ANORO ELLIPTA) 62.5-25 MCG/INH AEPB Inhale 1 puff into the lungs every morning.     No current facility-administered medications for this visit.       Review of Systems A complete review of systems was asked and was negative except for the following positive findings myocardial infarction, colon polyp, prostate enlargement  Blood pressure (!) 148/86, pulse (!) 57, temperature (!) 97.5 F (36.4 C), temperature source Skin, resp. rate 16, height 5\' 9"  (1.753 m), weight 165 lb (74.8 kg), SpO2 94 %.  Physical Exam CONSTITUTIONAL:  Pleasant, well-developed, well-nourished, and in no acute distress. EYES: Pupils equal and reactive to light, Sclera non-icteric EARS, NOSE, MOUTH AND THROAT:  The oropharynx was clear.  Dentition is good repair.  Oral mucosa pink  and moist. LYMPH NODES:  Lymph nodes in the neck and axillae were normal RESPIRATORY:  Lungs were clear.  Normal respiratory effort without pathologic use of accessory muscles of respiration CARDIOVASCULAR: Heart was regular without murmurs.  There were no carotid bruits. GI: The abdomen was soft, nontender, and nondistended. There were no palpable masses. There was no hepatosplenomegaly. There were normal bowel sounds in all quadrants. GU:  Rectal deferred.   MUSCULOSKELETAL:  Normal muscle strength and tone.  No clubbing or cyanosis.   SKIN:  There were no pathologic skin lesions.  There were no nodules on palpation. NEUROLOGIC:  Sensation is normal.  Cranial nerves are grossly intact.  PSYCH:  Oriented to person, place and time.  Mood and affect are normal.  Data Reviewed CT scan  I have personally reviewed the patient's imaging, laboratory findings and medical records.    Assessment    I have independently reviewed the patient's CT scan.  There is a right lower lobe mass with associated mediastinal adenopathy.  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    Plan    I explained to the patient that the PET scan will be helpful in assessing any evidence of distant disease.  I did not see anything on the CT scan to suggest liver or adrenal disease.  As such we will await the results of the PET scan but I will also take the liberty of setting him up for a bronchoscopy with biopsy of the subcarinal lymphadenopathy.  He states that he will also be presented at our multidisciplinary clinic tomorrow.  I did not make a return visit form at this time but would be happy to see him should the need arise.       [name], MD 03/13/2018, 10:18 AM

## 2018-03-14 ENCOUNTER — Other Ambulatory Visit: Payer: BLUE CROSS/BLUE SHIELD

## 2018-03-14 ENCOUNTER — Telehealth: Payer: Self-pay | Admitting: *Deleted

## 2018-03-14 NOTE — Telephone Encounter (Signed)
Spoke with Anderson Malta at Dr. Zoila Shutter office since our original referral to Dr. Mortimer Fries at [facility] was changed to Dr. Raul Del.   Patient has seen Dr. Raul Del in the past but he does not do biopsies which is why Dr. Genevive Bi is requesting patient to see Dr. Mortimer Fries.   Anderson Malta will call patient today to arrange an appointment for the patient to see Dr. Raul Del.

## 2018-03-14 NOTE — Progress Notes (Signed)
Tumor Board Documentation  Timothy Yoder was presented by Dr Tasia Catchings at our Tumor Board on 03/14/2018, which included representatives from medical oncology, radiation oncology, surgical oncology, surgical, radiology, pathology, navigation, pharmacy, genetics, research, pulmonology, palliative care.  Timothy Yoder currently presents as a new patient with history of the following treatments: none.  Additionally, we reviewed previous medical and familial history, history of present illness, and recent lab results along with all available histopathologic and imaging studies. The tumor board considered available treatment options and made the following recommendations: Biopsy Will see Dr Mortimer Fries 11/26 to arrannge for Bronchoscopy biopsy or Dr Duwayne Heck 11/22  The following procedures/referrals were also placed: No orders of the defined types were placed in this encounter.   Clinical Trial Status: not discussed   Staging used:    National site-specific guidelines   were discussed with respect to the case.  Tumor board is a meeting of clinicians from various specialty areas who evaluate and discuss patients for whom a multidisciplinary approach is being considered. Final determinations in the plan of care are those of the provider(s). The responsibility for follow up of recommendations given during tumor board is that of the provider.   Today's extended care, comprehensive team conference, Timothy Yoder was not present for the discussion and was not examined.   Multidisciplinary Tumor Board is a multidisciplinary case peer review process.  Decisions discussed in the Multidisciplinary Tumor Board reflect the opinions of the specialists present at the conference without having examined the patient.  Ultimately, treatment and diagnostic decisions rest with the primary provider(s) and the patient.

## 2018-03-15 ENCOUNTER — Inpatient Hospital Stay: Payer: BLUE CROSS/BLUE SHIELD | Attending: Oncology | Admitting: Oncology

## 2018-03-15 ENCOUNTER — Encounter: Payer: Self-pay | Admitting: *Deleted

## 2018-03-15 ENCOUNTER — Encounter: Payer: Self-pay | Admitting: Oncology

## 2018-03-15 ENCOUNTER — Other Ambulatory Visit: Payer: Self-pay

## 2018-03-15 VITALS — BP 153/95 | HR 81 | Temp 97.2°F | Ht 68.5 in | Wt 164.3 lb

## 2018-03-15 DIAGNOSIS — R590 Localized enlarged lymph nodes: Secondary | ICD-10-CM

## 2018-03-15 DIAGNOSIS — Z87891 Personal history of nicotine dependence: Secondary | ICD-10-CM

## 2018-03-15 DIAGNOSIS — R911 Solitary pulmonary nodule: Secondary | ICD-10-CM

## 2018-03-15 DIAGNOSIS — R918 Other nonspecific abnormal finding of lung field: Secondary | ICD-10-CM

## 2018-03-15 NOTE — Progress Notes (Signed)
  Oncology Nurse Navigator Documentation  Navigator Location: [facility] (03/15/18 1300)   )Navigator Encounter Type: Clinic/MDC (03/15/18 1300)               Multidisiplinary Clinic Date: 03/15/18 (03/15/18 1300) Multidisiplinary Clinic Type: Thoracic (03/15/18 1300)   Patient Visit Type: MedOnc (03/15/18 1300)   Barriers/Navigation Needs: Coordination of Care (03/15/18 1300)   Interventions: Coordination of Care (03/15/18 1300)   Coordination of Care: Appts (03/15/18 1300)             met with patient and his wife during consultation in thoracic multidisciplinary clinic today with Dr. Tasia Catchings. MD reviewed imaging with patient and discussed next steps. Reviewed upcoming appts with patient. All questions answered during visit. Contact info given and instructed to call with any questions or concerns. Pt informed that will follow up with Dr. Tasia Catchings or Dr. Genevive Bi after biopsy to discuss results and treatment plan. Pt verbalized understanding. Nothing further needed at this time.      Time Spent with Patient: 60 (03/15/18 1300)

## 2018-03-15 NOTE — Progress Notes (Signed)
Patient here today as a new patient  

## 2018-03-17 DIAGNOSIS — R590 Localized enlarged lymph nodes: Secondary | ICD-10-CM | POA: Insufficient documentation

## 2018-03-17 DIAGNOSIS — R918 Other nonspecific abnormal finding of lung field: Secondary | ICD-10-CM | POA: Insufficient documentation

## 2018-03-17 NOTE — Progress Notes (Signed)
Hematology/Oncology Consult note [facility] Telephone:([phone] [phone]   Patient Care Team: Baxter Hire, MD as PCP - General (Internal Medicine) Sherren Mocha, MD as PCP - Cardiology (Cardiology) Telford Nab, RN as Registered Nurse  REFERRING PROVIDER: Dr.Fleming CHIEF COMPLAINTS/REASON FOR VISIT:  Evaluation of lung mass  HISTORY OF PRESENTING ILLNESS:  Timothy Yoder is a  62 y.o.  male with PMH listed below who was referred to me for evaluation of lung mass.  Patient is a former smoker quit smoking 7 years ago.  40-pack-year. Patient chest lung cancer screening on 03/11/2018. CT scan showed 2.4 cm right lower lobe nodule with a new necrotic.  Subcarinal lymph node. PET scan showed hypermetabolic right lower pulmonary nodule, consistent with primary bronchogenic carcinoma.  Fluid density subcarinal structure demonstrate no significant hypermetabolism.  Although this could represent an incidental benign lesion such as a cyst given the interval development since 02/24/2017, necrotic adenopathy cannot be excluded and tissue sampling should be considered.  Patient was seen by Dr. Faith Rogue for initial evaluation on 03/13/2018.  Patient has not got PET scan at that time. Today patient was accompanied by his wife to the oncology clinic to discuss management plan.  And PET scan results. Patient reports chronic shortness of breath with moderate exertion.  Appetite is good denies any fever, chills, hemoptysis, weight loss.  Review of Systems  Constitutional: Negative for appetite change, chills, fatigue, fever and unexpected weight change.  HENT:   Negative for hearing loss and voice change.   Eyes: Negative for eye problems and icterus.  Respiratory: Positive for shortness of breath. Negative for chest tightness and cough.   Cardiovascular: Negative for chest pain and leg swelling.  Gastrointestinal: Negative for abdominal distention and abdominal  pain.  Endocrine: Negative for hot flashes.  Genitourinary: Negative for difficulty urinating, dysuria and frequency.   Musculoskeletal: Negative for arthralgias.  Skin: Negative for itching and rash.  Neurological: Negative for light-headedness and numbness.  Hematological: Negative for adenopathy. Does not bruise/bleed easily.  Psychiatric/Behavioral: Negative for confusion.   MEDICAL HISTORY:  Past Medical History:  Diagnosis Date  . BPH (benign prostatic hyperplasia)   . CAD (coronary artery disease)    a. 04/2014 anterolateral STEMI/PCI: LM nl, LAD 46m (2.75x16 Promus DES, D1 50p, LCX nl, RI nl, OM1 100 small/chronic, RCA dominant, 20-54m, 75m/d (FFR 0.90->Med Rx), RPDA 50, RPLA 70, EF 60%.  Marland Kitchen COPD (chronic obstructive pulmonary disease) (HCC)    Dr Raul Del  . Former tobacco use   . Gastroesophageal reflux disease   . HTN (hypertension)   . Hyperlipidemia   . Hypokalemia   . Myocardial infarction ([place]) 04/2014   Dr Sherren Mocha  . Stented coronary artery     SURGICAL HISTORY: Past Surgical History:  Procedure Laterality Date  . CARDIAC CATHETERIZATION    . CHOLECYSTECTOMY N/A 02/15/2016   Procedure: LAPAROSCOPIC CHOLECYSTECTOMY WITH INTRAOPERATIVE CHOLANGIOGRAM;  Surgeon: Leonie Green, MD;  Location: [facility] ORS;  Service: General;  Laterality: N/A;  . COLONOSCOPY    . COLONOSCOPY WITH PROPOFOL N/A 08/02/2015   Procedure: COLONOSCOPY WITH PROPOFOL;  Surgeon: Manya Silvas, MD;  Location[facility] ENDOSCOPY;  Service: Endoscopy;  Laterality: N/A;  . ESOPHAGOGASTRODUODENOSCOPY (EGD) WITH PROPOFOL N/A 08/02/2015   Procedure: ESOPHAGOGASTRODUODENOSCOPY (EGD) WITH PROPOFOL;  Surgeon: Manya Silvas, MD;  Location: [facility] ENDOSCOPY;  Service: Endoscopy;  Laterality: N/A;  . KNEE ARTHROSCOPY Left   . LEFT HEART CATHETERIZATION WITH CORONARY ANGIOGRAM N/A 05/03/2014   Procedure: LEFT  HEART CATHETERIZATION WITH CORONARY ANGIOGRAM;  Surgeon: Blane Ohara, MD;  Location: [facility]  CATH LAB;  Service: Cardiovascular;  Laterality: N/A;  . PERCUTANEOUS CORONARY STENT INTERVENTION (PCI-S) N/A 05/05/2014   Procedure: PERCUTANEOUS CORONARY STENT INTERVENTION (PCI-S);  Surgeon: Peter M Martinique, MD;  Location: [facility] CATH LAB;  Service: Cardiovascular;  Laterality: N/A;    SOCIAL HISTORY: Social History   Socioeconomic History  . Marital status: Married    Spouse name: Not on file  . Number of children: Not on file  . Years of education: Not on file  . Highest education level: Not on file  Occupational History  . Not on file  Social Needs  . Financial resource strain: Not on file  . Food insecurity:    Worry: Not on file    Inability: Not on file  . Transportation needs:    Medical: Not on file    Non-medical: Not on file  Tobacco Use  . Smoking status: Former Smoker    Packs/day: 1.00    Years: 40.00    Pack years: 40.00    Types: Cigarettes    Last attempt to quit: 02/14/2011    Years since quitting: 7.0  . Smokeless tobacco: Never Used  Substance and Sexual Activity  . Alcohol use: No  . Drug use: No  . Sexual activity: Not on file  Lifestyle  . Physical activity:    Days per week: Not on file    Minutes per session: Not on file  . Stress: Not on file  Relationships  . Social connections:    Talks on phone: Not on file    Gets together: Not on file    Attends religious service: Not on file    Active member of club or organization: Not on file    Attends meetings of clubs or organizations: Not on file    Relationship status: Not on file  . Intimate partner violence:    Fear of current or ex partner: Not on file    Emotionally abused: Not on file    Physically abused: Not on file    Forced sexual activity: Not on file  Other Topics Concern  . Not on file  Social History Narrative   The patient is married. He lives in [place]. He works full-time. He quit smoking in 2013.    FAMILY HISTORY: Family History  Problem Relation Age of Onset  .  CAD Mother 28       Died of an MI  . CAD Father 50       Died of a massive MI  . COPD Sister   . Lung cancer Brother   . Bone cancer Paternal Uncle   . Colon cancer Neg Hx   . Breast cancer Neg Hx     ALLERGIES:  has No Known Allergies.  MEDICATIONS:  Current Outpatient Medications  Medication Sig Dispense Refill  . albuterol (PROAIR HFA) 108 (90 Base) MCG/ACT inhaler Inhale 2 puffs into the lungs every 6 (six) hours as needed for wheezing or shortness of breath.     Marland Kitchen aspirin 81 MG chewable tablet Chew 1 tablet (81 mg total) by mouth daily.    Marland Kitchen atorvastatin (LIPITOR) 80 MG tablet Take 1 tablet (80 mg total) by mouth daily at 6 PM. 90 tablet 3  . carvedilol (COREG) 6.25 MG tablet Take 1 tablet (6.25 mg total) by mouth 2 (two) times daily. 180 tablet 3  . colestipol (COLESTID) 1 g tablet Take 2 g  by mouth daily as needed. Stomach issues    . lisinopril (PRINIVIL,ZESTRIL) 10 MG tablet Take 1 tablet (10 mg total) by mouth daily. 90 tablet 3  . nitroGLYCERIN (NITROSTAT) 0.4 MG SL tablet Place 1 tablet (0.4 mg total) under the tongue every 5 (five) minutes as needed for chest pain. X 3 doses 25 tablet 3  . omeprazole (PRILOSEC) 40 MG capsule Take 40 mg by mouth 2 (two) times daily.     . potassium chloride SA (KLOR-CON M20) 20 MEQ tablet Take 1 tablet (20 mEq total) by mouth daily. 90 tablet 1  . ranitidine (ZANTAC) 150 MG tablet Take 150 mg by mouth 2 (two) times daily.    . tamsulosin (FLOMAX) 0.4 MG CAPS capsule Take 0.4 mg by mouth daily after supper.    . umeclidinium-vilanterol (ANORO ELLIPTA) 62.5-25 MCG/INH AEPB Inhale 1 puff into the lungs every morning.     No current facility-administered medications for this visit.      PHYSICAL EXAMINATION: ECOG PERFORMANCE STATUS: 0 - Asymptomatic Vitals:   03/15/18 0918  BP: (!) 153/95  Pulse: 81  Temp: (!) 97.2 F (36.2 C)   Filed Weights   03/15/18 0918  Weight: 164 lb 5 oz (74.5 kg)    Physical Exam  Constitutional: He is  oriented to person, place, and time. No distress.  HENT:  Head: Normocephalic and atraumatic.  Mouth/Throat: Oropharynx is clear and moist.  Eyes: Pupils are equal, round, and reactive to light. EOM are normal. No scleral icterus.  Neck: Normal range of motion. Neck supple.  Cardiovascular: Normal rate, regular rhythm and normal heart sounds.  Pulmonary/Chest: Effort normal. No respiratory distress. He has no wheezes.  Decreased breath sounds bilaterally.  Abdominal: Soft. Bowel sounds are normal. He exhibits no distension and no mass. There is no tenderness.  Musculoskeletal: Normal range of motion. He exhibits no edema or deformity.  Neurological: He is alert and oriented to person, place, and time. No cranial nerve deficit. Coordination normal.  Skin: Skin is warm and dry. No rash noted. No erythema.  Psychiatric: He has a normal mood and affect. His behavior is normal. Thought content normal.     LABORATORY DATA:  I have reviewed the data as listed Lab Results  Component Value Date   WBC 5.8 10/01/2014   HGB 13.7 10/01/2014   HCT 41.1 10/01/2014   MCV 96.5 10/01/2014   PLT 232 10/01/2014   No results for input(s): NA, K, CL, CO2, GLUCOSE, BUN, CREATININE, CALCIUM, GFRNONAA, GFRAA, PROT, ALBUMIN, AST, ALT, ALKPHOS, BILITOT, BILIDIR, IBILI in the last 8760 hours. Iron/TIBC/Ferritin/ %Sat No results found for: IRON, TIBC, FERRITIN, IRONPCTSAT   RADIOGRAPHIC STUDIES: I have personally reviewed the radiological images as listed and agreed with the findings in the report. 03/11/2018 CT chest lung screen 1. New 2.4 cm right lower lobe nodule with a new necrotic appearing subcarinal lymph node. Lung-RADS 4B, suspicious. Additional imaging evaluation or consultation with Pulmonology or Thoracic Surgery recommended. These results will be called to the ordering clinician or representative by the Radiologist Assistant, and communication documented in the PACS or zVision Dashboard. 2. Aortic  atherosclerosis (ICD10-170.0). Coronary arterycalcification.3.  Emphysema (ICD10-J43.9). 03/13/2018 PET scan # Hypermetabolic right lower lobe pulmonary nodule, consistent with primary bronchogenic carcinoma. Fluid density subcarinal structure demonstrates no significant hypermetabolism. Although this could represent an incidental benign lesion such as a bronchogenic cyst, given interval development since 02/24/2017, necrotic adenopathy cannot be excluded and tissue sampling should be considered.   ASSESSMENT &  PLAN:  1. Lung nodule   2. Personal history of tobacco use, presenting hazards to health   3. Mediastinal lymphadenopathy    I independently reviewed the patient's CT scan and PET scan and discussed with patient and his wife. Given his smoking history, hypermetabolic activity on PET scan and the growth of nodules and lymphadenopathy within the past 12 months, highly suspecting patient has primary lung malignancy with lymph node involvement. Commend patient to establish care with pulmonology for biopsy of subcarinal lymph node and right lung nodule BX bronchoscopy.  Patient has an appointment with pulmonology next week.  If subcarinal lymph node showed cancer involvement, he will be stage III which need concurrent chemoradiation. If lymph node is negative, he may be a good candidate for resection per Dr. Faith Rogue.   All questions were answered. The patient knows to call the clinic with any problems questions or concerns.   Return of visit: To be determined depending on patient pathology results. Thank you for this kind referral and the opportunity to participate in the care of this patient. A copy of today's note is routed to referring provider  Total face to face encounter time for this patient visit was 60 min. >50% of the time was  spent in counseling and coordination of care.    Earlie Server, MD, PhD Hematology Oncology [facility] at [facility] Pager-  [phone]

## 2018-03-18 ENCOUNTER — Ambulatory Visit: Payer: BLUE CROSS/BLUE SHIELD

## 2018-03-19 ENCOUNTER — Telehealth: Payer: Self-pay | Admitting: Cardiovascular Disease

## 2018-03-19 ENCOUNTER — Ambulatory Visit (INDEPENDENT_AMBULATORY_CARE_PROVIDER_SITE_OTHER): Payer: BLUE CROSS/BLUE SHIELD | Admitting: Internal Medicine

## 2018-03-19 ENCOUNTER — Encounter: Payer: Self-pay | Admitting: Internal Medicine

## 2018-03-19 VITALS — BP 152/90 | HR 72 | Ht 68.5 in | Wt 165.2 lb

## 2018-03-19 DIAGNOSIS — R918 Other nonspecific abnormal finding of lung field: Secondary | ICD-10-CM | POA: Diagnosis not present

## 2018-03-19 NOTE — Progress Notes (Signed)
Name: Timothy Yoder MRN: [id number] DOB: Mar 09, 1956     CONSULTATION DATE: 03/19/2018 REFERRING MD : Dr. Genevive Bi  CHIEF COMPLAINT: Abnormal CT chest  STUDIES:     CXR independently reviewed by Me 03/11/2018 CT chest Independently reviewed by Me today Right lower lobe lung mass, subcarinal adenopathy Findings are highly suggestive of primary lung cancer   HISTORY OF PRESENT ILLNESS: 62 year old white male with a long-standing smoking history who quit approximately 7 years ago.   He has a 40-pack-year smoking history.  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   He did serve in [employer] but does not have any asbestos exposure.   He does have a sister who recently passed away from COPD related complications who is also a smoker.    As part of the lung screening service, he underwent a chest CT last year which was unremarkable and a repeat chest CT this year shows a right lower lobe nodule measuring 2.5 cm with a 1.6 cm subcarinal lymph node.    These were not present 1 year ago.   He denies any fevers, chills, hemoptysis, weight loss.  He states his appetite is good.   He does get short of breath with moderate exertion.  He is taking low-dose aspirin secondary to stents related to myocardial infarction 3 years ago.   CT chest results reviewed with patient    The Risks and Benefits of the Bronchoscopy procedure with ENB and EBUS were explained to patient/family.  I have discussed the risk for Acute Bleeding, increased chance of Infection, increased chance of Respiratory Failure and Cardiac Arrest, increased chance of pneumothorax and collapsed lung, as well as increased Stroke and Death.  I have also explained to avoid all types of NSAIDs 1 week prior to procedure date  to decrease chance of bleeding, and also to avoid food and drinks the midnight prior to procedure.  The procedure consists of a video camera with a light source to be placed  and inserted  into the lungs to  look for abnormal tissue and to obtain tissue samples by using needle and biopsy tools.  The patient/family understand the risks and benefits and have agreed to proceed with procedure.   Patient has a diagnosis of COPD He sees Dr. Raul Del Previous PFT shows a ratio of 53% predicted with 62% predicted FEV1 Findings suggest moderate COPD   Patient sees cardiology has a history of MI in 2016 Status post cardiac stents on aspirin therapy Patient needs to see cardiology prior to procedure   PAST MEDICAL HISTORY :   has a past medical history of BPH (benign prostatic hyperplasia), CAD (coronary artery disease), COPD (chronic obstructive pulmonary disease) ([place]), Former tobacco use, Gastroesophageal reflux disease, HTN (hypertension), Hyperlipidemia, Hypokalemia, Myocardial infarction ([place]) (04/2014), and Stented coronary artery.  has a past surgical history that includes left heart catheterization with coronary angiogram (N/A, 05/03/2014); percutaneous coronary stent intervention (pci-s) (N/A, 05/05/2014); Colonoscopy with propofol (N/A, 08/02/2015); Esophagogastroduodenoscopy (egd) with propofol (N/A, 08/02/2015); Cardiac catheterization; Knee arthroscopy (Left); Cholecystectomy (N/A, 02/15/2016); and Colonoscopy. Prior to Admission medications   Medication Sig Start Date End Date Taking? Authorizing Provider  albuterol (PROAIR HFA) 108 (90 Base) MCG/ACT inhaler Inhale 2 puffs into the lungs every 6 (six) hours as needed for wheezing or shortness of breath.  06/04/15   [provider]  aspirin 81 MG chewable tablet Chew 1 tablet (81 mg total) by mouth daily. 05/06/14   Sharolyn Douglas,  Clance Boll, NP  atorvastatin (LIPITOR) 80 MG tablet Take 1 tablet (80 mg total) by mouth daily at 6 PM. 06/18/17   Sherren Mocha, MD  carvedilol (COREG) 6.25 MG tablet Take 1 tablet (6.25 mg total) by mouth 2 (two) times daily. 06/18/17 06/13/18  Sherren Mocha, MD  colestipol  (COLESTID) 1 g tablet Take 2 g by mouth daily as needed. Stomach issues 03/27/17 03/27/18  [provider]  lisinopril (PRINIVIL,ZESTRIL) 10 MG tablet Take 1 tablet (10 mg total) by mouth daily. 06/18/17   Sherren Mocha, MD  nitroGLYCERIN (NITROSTAT) 0.4 MG SL tablet Place 1 tablet (0.4 mg total) under the tongue every 5 (five) minutes as needed for chest pain. X 3 doses 06/18/17   Sherren Mocha, MD  omeprazole (PRILOSEC) 40 MG capsule Take 40 mg by mouth 2 (two) times daily.     [provider]  potassium chloride SA (KLOR-CON M20) 20 MEQ tablet Take 1 tablet (20 mEq total) by mouth daily. 11/26/17   Sherren Mocha, MD  ranitidine (ZANTAC) 150 MG tablet Take 150 mg by mouth 2 (two) times daily.    [provider]  tamsulosin (FLOMAX) 0.4 MG CAPS capsule Take 0.4 mg by mouth daily after supper.    [provider]  umeclidinium-vilanterol (ANORO ELLIPTA) 62.5-25 MCG/INH AEPB Inhale 1 puff into the lungs every morning.    [provider]   No Known Allergies  FAMILY HISTORY:  family history includes Bone cancer in his paternal uncle; CAD (age of onset: 65) in his father; CAD (age of onset: 70) in his mother; COPD in his sister; Lung cancer in his brother. SOCIAL HISTORY:  reports that he quit smoking about 7 years ago. His smoking use included cigarettes. He has a 40.00 pack-year smoking history. He has never used smokeless tobacco. He reports that he does not drink alcohol or use drugs.  REVIEW OF SYSTEMS:   Constitutional: Negative for fever, chills, weight loss, malaise/fatigue and diaphoresis.  HENT: Negative for hearing loss, ear pain, nosebleeds, congestion, sore throat, neck pain, tinnitus and ear discharge.   Eyes: Negative for blurred vision, double vision, photophobia, pain, discharge and redness.  Respiratory: Negative for cough, hemoptysis, sputum production, shortness of breath, wheezing and stridor.   Cardiovascular: Negative for chest  pain, palpitations, orthopnea, claudication, leg swelling and PND.  Gastrointestinal: Negative for heartburn, nausea, vomiting, abdominal pain, diarrhea, constipation, blood in stool and melena.  Genitourinary: Negative for dysuria, urgency, frequency, hematuria and flank pain.  Musculoskeletal: Negative for myalgias, back pain, joint pain and falls.  Skin: Negative for itching and rash.  Neurological: Negative for dizziness, tingling, tremors, sensory change, speech change, focal weakness, seizures, loss of consciousness, weakness and headaches.  Endo/Heme/Allergies: Negative for environmental allergies and polydipsia. Does not bruise/bleed easily.  ALL OTHER ROS ARE NEGATIVE  BP (!) 152/90 (BP Location: Right Arm, Cuff Size: Normal)   Pulse 72   Ht 5' 8.5" (1.74 m)   Wt 165 lb 3.2 oz (74.9 kg)   SpO2 99%   BMI 24.75 kg/m     Physical Examination:   GENERAL:NAD, no fevers, chills, no weakness no fatigue HEAD: Normocephalic, atraumatic.  EYES: Pupils equal, round, reactive to light. Extraocular muscles intact. No scleral icterus.  MOUTH: Moist mucosal membrane.   EAR, NOSE, THROAT: Clear without exudates. No external lesions.  NECK: Supple. No thyromegaly. No nodules. No JVD.  PULMONARY:CTA B/L no wheezes, no crackles, no rhonchi CARDIOVASCULAR: S1 and S2. Regular rate and rhythm. No murmurs, rubs,  or gallops. No edema.  GASTROINTESTINAL: Soft, nontender, nondistended. No masses. Positive bowel sounds.  MUSCULOSKELETAL: No swelling, clubbing, or edema. Range of motion full in all extremities.  NEUROLOGIC: Cranial nerves II through XII are intact. No gross focal neurological deficits.  SKIN: No ulceration, lesions, rashes, or cyanosis. Skin warm and dry. Turgor intact.  PSYCHIATRIC: Mood, affect within normal limits. The patient is awake, alert and oriented x 3. Insight, judgment intact.    03/11/2018 CT chest lung screen 1. New 2.4 cm right lower lobe nodule with a new necrotic  appearing subcarinal lymph node. Lung-RADS 4B, suspicious. Additional imaging evaluation or consultation with Pulmonology or Thoracic Surgery recommended. These results will be called to the ordering clinician or representative by the Radiologist Assistant, and communication documented in the PACS or zVision Dashboard. 2. Aortic atherosclerosis (ICD10-170.0). Coronary arterycalcification.3. Emphysema (ICD10-J43.9). 03/13/2018 PET scan # Hypermetabolic right lower lobe pulmonary nodule, consistent with primary bronchogenic carcinoma. Fluid density subcarinal structure demonstrates no significant hypermetabolism. Although this could represent an incidental benign lesion such as a bronchogenic cyst, given interval development since 02/24/2017, necrotic adenopathy cannot be excluded and tissue sampling should be considered.   ASSESSMENT / PLAN: 62 year old pleasant white male seen today for abnormal CT chest with right lower lobe lung mass and subcarinal adenopathy in a patient with extensive smoking history  Findings suggest primary lung cancer Patient needs biopsy for definitive diagnosis-patient needs electromagnetic navigation bronchoscopy for the right lung mass and will also need endobronchial ultrasound tissue sampling from the subcarinal lymph node     The Risks and Benefits of the Bronchoscopy procedure with ENB and EBUS were explained to patient/family.  I have discussed the risk for Acute Bleeding, increased chance of Infection, increased chance of Respiratory Failure and Cardiac Arrest, increased chance of pneumothorax and collapsed lung, as well as increased Stroke and Death.  I have also explained to avoid all types of NSAIDs 1 week prior to procedure date  to decrease chance of bleeding, and also to avoid food and drinks the midnight prior to procedure.  The procedure consists of a video camera with a light source to be placed and inserted  into the lungs to  look for abnormal  tissue and to obtain tissue samples by using needle and biopsy tools.  The patient/family understand the risks and benefits and have agreed to proceed with procedure.   I also recommend preoperative cardiology evaluation prior to procedure  Patient/Family are satisfied with Plan of action and management. All questions answered Plan for procedure early next week  Esther Bradstreet Patricia Pesa, M.D.  [facility]  Medical Director [place] Director [facility] Cardio-Pulmonary Department

## 2018-03-19 NOTE — H&P (View-Only) (Signed)
Name: Timothy Yoder MRN: [id number] DOB: 1956/03/13     CONSULTATION DATE: 03/19/2018 REFERRING MD : Dr. Genevive Bi  CHIEF COMPLAINT: Abnormal CT chest  STUDIES:     CXR independently reviewed by Me 03/11/2018 CT chest Independently reviewed by Me today Right lower lobe lung mass, subcarinal adenopathy Findings are highly suggestive of primary lung cancer   HISTORY OF PRESENT ILLNESS: 62 year old white male with a long-standing smoking history who quit approximately 7 years ago.   He has a 40-pack-year smoking history.  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   He did serve in [employer] but does not have any asbestos exposure.   He does have a sister who recently passed away from COPD related complications who is also a smoker.    As part of the lung screening service, he underwent a chest CT last year which was unremarkable and a repeat chest CT this year shows a right lower lobe nodule measuring 2.5 cm with a 1.6 cm subcarinal lymph node.    These were not present 1 year ago.   He denies any fevers, chills, hemoptysis, weight loss.  He states his appetite is good.   He does get short of breath with moderate exertion.  He is taking low-dose aspirin secondary to stents related to myocardial infarction 3 years ago.   CT chest results reviewed with patient    The Risks and Benefits of the Bronchoscopy procedure with ENB and EBUS were explained to patient/family.  I have discussed the risk for Acute Bleeding, increased chance of Infection, increased chance of Respiratory Failure and Cardiac Arrest, increased chance of pneumothorax and collapsed lung, as well as increased Stroke and Death.  I have also explained to avoid all types of NSAIDs 1 week prior to procedure date  to decrease chance of bleeding, and also to avoid food and drinks the midnight prior to procedure.  The procedure consists of a video camera with a light source to be placed  and inserted  into the lungs to  look for abnormal tissue and to obtain tissue samples by using needle and biopsy tools.  The patient/family understand the risks and benefits and have agreed to proceed with procedure.   Patient has a diagnosis of COPD He sees Dr. Raul Del Previous PFT shows a ratio of 53% predicted with 62% predicted FEV1 Findings suggest moderate COPD   Patient sees cardiology has a history of MI in 2016 Status post cardiac stents on aspirin therapy Patient needs to see cardiology prior to procedure   PAST MEDICAL HISTORY :   has a past medical history of BPH (benign prostatic hyperplasia), CAD (coronary artery disease), COPD (chronic obstructive pulmonary disease) ([place]), Former tobacco use, Gastroesophageal reflux disease, HTN (hypertension), Hyperlipidemia, Hypokalemia, Myocardial infarction ([place]) (04/2014), and Stented coronary artery.  has a past surgical history that includes left heart catheterization with coronary angiogram (N/A, 05/03/2014); percutaneous coronary stent intervention (pci-s) (N/A, 05/05/2014); Colonoscopy with propofol (N/A, 08/02/2015); Esophagogastroduodenoscopy (egd) with propofol (N/A, 08/02/2015); Cardiac catheterization; Knee arthroscopy (Left); Cholecystectomy (N/A, 02/15/2016); and Colonoscopy. Prior to Admission medications   Medication Sig Start Date End Date Taking? Authorizing Provider  albuterol (PROAIR HFA) 108 (90 Base) MCG/ACT inhaler Inhale 2 puffs into the lungs every 6 (six) hours as needed for wheezing or shortness of breath.  06/04/15   [provider]  aspirin 81 MG chewable tablet Chew 1 tablet (81 mg total) by mouth daily. 05/06/14   Sharolyn Douglas,  Clance Boll, NP  atorvastatin (LIPITOR) 80 MG tablet Take 1 tablet (80 mg total) by mouth daily at 6 PM. 06/18/17   Sherren Mocha, MD  carvedilol (COREG) 6.25 MG tablet Take 1 tablet (6.25 mg total) by mouth 2 (two) times daily. 06/18/17 06/13/18  Sherren Mocha, MD  colestipol  (COLESTID) 1 g tablet Take 2 g by mouth daily as needed. Stomach issues 03/27/17 03/27/18  [provider]  lisinopril (PRINIVIL,ZESTRIL) 10 MG tablet Take 1 tablet (10 mg total) by mouth daily. 06/18/17   Sherren Mocha, MD  nitroGLYCERIN (NITROSTAT) 0.4 MG SL tablet Place 1 tablet (0.4 mg total) under the tongue every 5 (five) minutes as needed for chest pain. X 3 doses 06/18/17   Sherren Mocha, MD  omeprazole (PRILOSEC) 40 MG capsule Take 40 mg by mouth 2 (two) times daily.     [provider]  potassium chloride SA (KLOR-CON M20) 20 MEQ tablet Take 1 tablet (20 mEq total) by mouth daily. 11/26/17   Sherren Mocha, MD  ranitidine (ZANTAC) 150 MG tablet Take 150 mg by mouth 2 (two) times daily.    [provider]  tamsulosin (FLOMAX) 0.4 MG CAPS capsule Take 0.4 mg by mouth daily after supper.    [provider]  umeclidinium-vilanterol (ANORO ELLIPTA) 62.5-25 MCG/INH AEPB Inhale 1 puff into the lungs every morning.    [provider]   No Known Allergies  FAMILY HISTORY:  family history includes Bone cancer in his paternal uncle; CAD (age of onset: 5) in his father; CAD (age of onset: 67) in his mother; COPD in his sister; Lung cancer in his brother. SOCIAL HISTORY:  reports that he quit smoking about 7 years ago. His smoking use included cigarettes. He has a 40.00 pack-year smoking history. He has never used smokeless tobacco. He reports that he does not drink alcohol or use drugs.  REVIEW OF SYSTEMS:   Constitutional: Negative for fever, chills, weight loss, malaise/fatigue and diaphoresis.  HENT: Negative for hearing loss, ear pain, nosebleeds, congestion, sore throat, neck pain, tinnitus and ear discharge.   Eyes: Negative for blurred vision, double vision, photophobia, pain, discharge and redness.  Respiratory: Negative for cough, hemoptysis, sputum production, shortness of breath, wheezing and stridor.   Cardiovascular: Negative for chest  pain, palpitations, orthopnea, claudication, leg swelling and PND.  Gastrointestinal: Negative for heartburn, nausea, vomiting, abdominal pain, diarrhea, constipation, blood in stool and melena.  Genitourinary: Negative for dysuria, urgency, frequency, hematuria and flank pain.  Musculoskeletal: Negative for myalgias, back pain, joint pain and falls.  Skin: Negative for itching and rash.  Neurological: Negative for dizziness, tingling, tremors, sensory change, speech change, focal weakness, seizures, loss of consciousness, weakness and headaches.  Endo/Heme/Allergies: Negative for environmental allergies and polydipsia. Does not bruise/bleed easily.  ALL OTHER ROS ARE NEGATIVE  BP (!) 152/90 (BP Location: Right Arm, Cuff Size: Normal)   Pulse 72   Ht 5' 8.5" (1.74 m)   Wt 165 lb 3.2 oz (74.9 kg)   SpO2 99%   BMI 24.75 kg/m     Physical Examination:   GENERAL:NAD, no fevers, chills, no weakness no fatigue HEAD: Normocephalic, atraumatic.  EYES: Pupils equal, round, reactive to light. Extraocular muscles intact. No scleral icterus.  MOUTH: Moist mucosal membrane.   EAR, NOSE, THROAT: Clear without exudates. No external lesions.  NECK: Supple. No thyromegaly. No nodules. No JVD.  PULMONARY:CTA B/L no wheezes, no crackles, no rhonchi CARDIOVASCULAR: S1 and S2. Regular rate and rhythm. No murmurs, rubs,  or gallops. No edema.  GASTROINTESTINAL: Soft, nontender, nondistended. No masses. Positive bowel sounds.  MUSCULOSKELETAL: No swelling, clubbing, or edema. Range of motion full in all extremities.  NEUROLOGIC: Cranial nerves II through XII are intact. No gross focal neurological deficits.  SKIN: No ulceration, lesions, rashes, or cyanosis. Skin warm and dry. Turgor intact.  PSYCHIATRIC: Mood, affect within normal limits. The patient is awake, alert and oriented x 3. Insight, judgment intact.    03/11/2018 CT chest lung screen 1. New 2.4 cm right lower lobe nodule with a new necrotic  appearing subcarinal lymph node. Lung-RADS 4B, suspicious. Additional imaging evaluation or consultation with Pulmonology or Thoracic Surgery recommended. These results will be called to the ordering clinician or representative by the Radiologist Assistant, and communication documented in the PACS or zVision Dashboard. 2. Aortic atherosclerosis (ICD10-170.0). Coronary arterycalcification.3. Emphysema (ICD10-J43.9). 03/13/2018 PET scan # Hypermetabolic right lower lobe pulmonary nodule, consistent with primary bronchogenic carcinoma. Fluid density subcarinal structure demonstrates no significant hypermetabolism. Although this could represent an incidental benign lesion such as a bronchogenic cyst, given interval development since 02/24/2017, necrotic adenopathy cannot be excluded and tissue sampling should be considered.   ASSESSMENT / PLAN: 62 year old pleasant white male seen today for abnormal CT chest with right lower lobe lung mass and subcarinal adenopathy in a patient with extensive smoking history  Findings suggest primary lung cancer Patient needs biopsy for definitive diagnosis-patient needs electromagnetic navigation bronchoscopy for the right lung mass and will also need endobronchial ultrasound tissue sampling from the subcarinal lymph node     The Risks and Benefits of the Bronchoscopy procedure with ENB and EBUS were explained to patient/family.  I have discussed the risk for Acute Bleeding, increased chance of Infection, increased chance of Respiratory Failure and Cardiac Arrest, increased chance of pneumothorax and collapsed lung, as well as increased Stroke and Death.  I have also explained to avoid all types of NSAIDs 1 week prior to procedure date  to decrease chance of bleeding, and also to avoid food and drinks the midnight prior to procedure.  The procedure consists of a video camera with a light source to be placed and inserted  into the lungs to  look for abnormal  tissue and to obtain tissue samples by using needle and biopsy tools.  The patient/family understand the risks and benefits and have agreed to proceed with procedure.   I also recommend preoperative cardiology evaluation prior to procedure  Patient/Family are satisfied with Plan of action and management. All questions answered Plan for procedure early next week  Elsa Ploch Patricia Pesa, M.D.  [facility]  Medical Director [place] Director [facility] Cardio-Pulmonary Department

## 2018-03-19 NOTE — Patient Instructions (Signed)
Cardiology Evaluation prior to Bronchoscopy  Plan for ENB and EBUS  STOP Aspirin products 1 week prior to procedure   The Risks and Benefits of the Bronchoscopy procedure with ENB/EBUS were explained to patient/family.  I have discussed the risk for Acute Bleeding, increased chance of Infection, increased chance of Respiratory Failure and Cardiac Arrest, increased chance of pneumothorax and collapsed lung, as well as increased Stroke and Death.  I have also explained to avoid all types of NSAIDs 1 week prior to procedure date  to decrease chance of bleeding, and also to avoid food and drinks the midnight prior to procedure.  The procedure consists of a video camera with a light source to be placed and inserted  into the lungs to  look for abnormal tissue and to obtain tissue samples by using needle and biopsy tools.  The patient/family understand the risks and benefits and have agreed to proceed with procedure.

## 2018-03-19 NOTE — Telephone Encounter (Signed)
° °  [facility] Pre-operative Risk Assessment    Request for surgical clearance:  1. What type of surgery is being performed?  Bronchoscopy   2. When is this surgery scheduled? Pending   3. What type of clearance is required (medical clearance vs. Pharmacy clearance to hold med vs. Both)? Medical Clearance   4. Are there any medications that need to be held prior to surgery and how long?Aspirin-Pt have stopped this already   5. Practice name and name of physician performing surgery? Dr Patricia Pesa   6. What is your office phone number [phone]    7.   What is your office fax number [phone]  8.   Anesthesia type (None, local, MAC, general) ? General   Glyn Ade 03/19/2018, 10:49 AM  _________________________________________________________________   (provider comments below)

## 2018-03-20 ENCOUNTER — Telehealth: Payer: Self-pay

## 2018-03-20 NOTE — Telephone Encounter (Signed)
03/20/18 at 4:40 pm EST spoke with Yvone Neu at [facility].  Procedure codes [phone], [phone] and [phone] are valid and billable codes which does not require PA. Call Ref # [id number]. Rhonda J Cobb

## 2018-03-20 NOTE — Telephone Encounter (Signed)
Patient is scheduled for Bronch on 03/26/18 @1300  EBUS for Subcarinal adenopathy, CPT: 50539, [phone] Bronchoscopy with navigation, for right lung mass, CPT: [phone]

## 2018-03-20 NOTE — Telephone Encounter (Signed)
   Primary Cardiologist: Sherren Mocha, MD  Chart reviewed as part of pre-operative protocol coverage.   62 yo male with hx of CAD s/p anterolateral STEMI in 2016 tx with DES to LAD, HTN, HLP.  He has residual disease in the RCA which was not hemodynamically significant by FFR.  He was last seen by Dr. Burt Knack in 05/2017.  RCRI:  0.9%  Patient was contacted 03/20/2018 in reference to pre-operative risk assessment for pending surgery as outlined below. He has done well since last seen without symptoms of chest pain, shortness of breath.    DASI:  6.28 METs  Therefore, based on ACC/AHA guidelines, the patient would be at acceptable risk for the planned procedure without further cardiovascular testing.   Letter will be faxed to requesting surgeon's office.  Richardson Dopp, PA-C 03/20/2018, 3:28 PM

## 2018-03-20 NOTE — Telephone Encounter (Signed)
   Call back staff: Marland Kitchen Clearance letter has been faxed to the requesting surgeon. . Please contact the surgeon's office to ensure it has been received. . This phone note will be removed from the preop pool. . Please sign encounter when completed.  Richardson Dopp, PA-C    03/20/2018 3:39 PM

## 2018-03-25 ENCOUNTER — Encounter
Admission: RE | Admit: 2018-03-25 | Discharge: 2018-03-25 | Disposition: A | Discharge status: Home / Self Care | Payer: BLUE CROSS/BLUE SHIELD | Source: Ambulatory Visit | Attending: Internal Medicine | Admitting: Internal Medicine

## 2018-03-25 ENCOUNTER — Other Ambulatory Visit: Payer: Self-pay

## 2018-03-25 DIAGNOSIS — R001 Bradycardia, unspecified: Secondary | ICD-10-CM | POA: Diagnosis not present

## 2018-03-25 DIAGNOSIS — Z01818 Encounter for other preprocedural examination: Secondary | ICD-10-CM | POA: Insufficient documentation

## 2018-03-25 LAB — BASIC METABOLIC PANEL
Anion gap: 6 (ref 5–15)
BUN: 8 mg/dL (ref 8–23)
CO2: 29 mmol/L (ref 22–32)
Calcium: 9 mg/dL (ref 8.9–10.3)
Chloride: 105 mmol/L (ref 98–111)
Creatinine, Ser: 0.91 mg/dL (ref 0.61–1.24)
GFR calc Af Amer: >60 mL/min (ref >60)
GFR calc non Af Amer: >60 mL/min (ref >60)
Glucose, Bld: 87 mg/dL (ref 70–99)
POTASSIUM: 3.7 mmol/L (ref 3.5–5.1)
Sodium: 140 mmol/L (ref 135–145)

## 2018-03-25 LAB — CBC
HCT: 46.1 % (ref 39.0–52.0)
Hemoglobin: 15.3 g/dL (ref 13.0–17.0)
MCH: 31.5 pg (ref 26.0–34.0)
MCHC: 33.2 g/dL (ref 30.0–36.0)
MCV: 95.1 fL (ref 80.0–100.0)
NRBC: 0 % (ref 0.0–0.2)
Platelets: 232 10*3/uL (ref 150–400)
RBC: 4.85 MIL/uL (ref 4.22–5.81)
RDW: 13.1 % (ref 11.5–15.5)
WBC: 7.6 10*3/uL (ref 4.0–10.5)

## 2018-03-25 NOTE — Patient Instructions (Addendum)
Your procedure is scheduled on: 03/26/18 Report to [facility]. To find out your arrival time please call [phone] between 1PM - 3PM on Today.  Remember: Instructions that are not followed completely may result in serious medical risk, up to and including death, or upon the discretion of your surgeon and anesthesiologist your surgery may need to be rescheduled.     _X__ 1. Do not eat food after midnight the night before your procedure.                 No gum chewing or hard candies. You may drink clear liquids up to 2 hours                 before you are scheduled to arrive for your surgery- DO not drink clear                 liquids within 2 hours of the start of your surgery.                 Clear Liquids include:  water, apple juice without pulp, clear carbohydrate                 drink such as Clearfast or Gatorade, Black Coffee or Tea (Do not add                 anything to coffee or tea).  __X__2.  On the morning of surgery brush your teeth with toothpaste and water, you                 may rinse your mouth with mouthwash if you wish.  Do not swallow any              toothpaste of mouthwash.     _X__ 3.  No Alcohol for 24 hours before or after surgery.   _X__ 4.  Do Not Smoke or use e-cigarettes For 24 Hours Prior to Your Surgery.                 Do not use any chewable tobacco products for at least 6 hours prior to                 surgery.  ____  5.  Bring all medications with you on the day of surgery if instructed.   __X__  6.  Notify your doctor if there is any change in your medical condition      (cold, fever, infections).     Do not wear jewelry, make-up, hairpins, clips or nail polish. Do not wear lotions, powders, or perfumes.  Do not shave 48 hours prior to surgery. Men may shave face and neck. Do not bring valuables to the hospital.    [facility] is not responsible for any belongings or  valuables.  Contacts, dentures/partials or body piercings may not be worn into surgery. Bring a case for your contacts, glasses or hearing aids, a denture cup will be supplied. Leave your suitcase in the car. After surgery it may be brought to your room. For patients admitted to the hospital, discharge time is determined by your treatment team.   Patients discharged the day of surgery will not be allowed to drive home.   Please read over the following fact sheets that you were given:   MRSA Information  __X__ Take these medicines the morning of surgery with A SIP OF WATER:    1. carvedilol (  COREG)  2. omeprazole (PRILOSEC)  3. ranitidine (ZANTAC)  4.  5.  6.  ____ Fleet Enema (as directed)   ____ Use CHG Soap/SAGE wipes as directed  __X__ Use inhalers on the day of surgery  BOTH  ____ Stop metformin/Janumet/Farxiga 2 days prior to surgery    ____ Take 1/2 of usual insulin dose the night before surgery. No insulin the morning          of surgery.   ____ Stop Blood Thinners Coumadin/Plavix/Xarelto/Pleta/Pradaxa/Eliquis/Effient/Aspirin  on   Or contact your Surgeon, Cardiologist or Medical Doctor regarding  ability to stop your blood thinners  __X__ Stop Anti-inflammatories 7 days before surgery such as Advil, Ibuprofen, Motrin,  BC or Goodies Powder, Naprosyn, Naproxen, Aleve, Aspirin (ALREADY STOPPED)   __X__ Stop all herbal supplements, fish oil or vitamin E until after surgery.    ____ Bring C-Pap to the hospital.

## 2018-03-26 ENCOUNTER — Ambulatory Visit
Admission: RE | Admit: 2018-03-26 | Discharge: 2018-03-26 | Disposition: A | Discharge status: Home / Self Care | Payer: BLUE CROSS/BLUE SHIELD | Source: Ambulatory Visit | Attending: Internal Medicine | Admitting: Internal Medicine

## 2018-03-26 ENCOUNTER — Ambulatory Visit: Payer: BLUE CROSS/BLUE SHIELD

## 2018-03-26 ENCOUNTER — Encounter
Admission: RE | Disposition: A | Discharge status: Home / Self Care | Payer: Self-pay | Source: Ambulatory Visit | Attending: Internal Medicine

## 2018-03-26 ENCOUNTER — Other Ambulatory Visit: Payer: Self-pay

## 2018-03-26 ENCOUNTER — Telehealth: Payer: Self-pay | Admitting: *Deleted

## 2018-03-26 ENCOUNTER — Ambulatory Visit: Payer: BLUE CROSS/BLUE SHIELD | Admitting: Anesthesiology

## 2018-03-26 DIAGNOSIS — Z9049 Acquired absence of other specified parts of digestive tract: Secondary | ICD-10-CM | POA: Diagnosis not present

## 2018-03-26 DIAGNOSIS — J449 Chronic obstructive pulmonary disease, unspecified: Secondary | ICD-10-CM | POA: Diagnosis not present

## 2018-03-26 DIAGNOSIS — Z825 Family history of asthma and other chronic lower respiratory diseases: Secondary | ICD-10-CM | POA: Insufficient documentation

## 2018-03-26 DIAGNOSIS — N400 Enlarged prostate without lower urinary tract symptoms: Secondary | ICD-10-CM | POA: Insufficient documentation

## 2018-03-26 DIAGNOSIS — I10 Essential (primary) hypertension: Secondary | ICD-10-CM | POA: Diagnosis not present

## 2018-03-26 DIAGNOSIS — Z955 Presence of coronary angioplasty implant and graft: Secondary | ICD-10-CM | POA: Insufficient documentation

## 2018-03-26 DIAGNOSIS — Z8249 Family history of ischemic heart disease and other diseases of the circulatory system: Secondary | ICD-10-CM | POA: Diagnosis not present

## 2018-03-26 DIAGNOSIS — Z7982 Long term (current) use of aspirin: Secondary | ICD-10-CM | POA: Insufficient documentation

## 2018-03-26 DIAGNOSIS — I2510 Atherosclerotic heart disease of native coronary artery without angina pectoris: Secondary | ICD-10-CM | POA: Diagnosis not present

## 2018-03-26 DIAGNOSIS — Z801 Family history of malignant neoplasm of trachea, bronchus and lung: Secondary | ICD-10-CM | POA: Diagnosis not present

## 2018-03-26 DIAGNOSIS — Z87891 Personal history of nicotine dependence: Secondary | ICD-10-CM | POA: Insufficient documentation

## 2018-03-26 DIAGNOSIS — E785 Hyperlipidemia, unspecified: Secondary | ICD-10-CM | POA: Insufficient documentation

## 2018-03-26 DIAGNOSIS — K219 Gastro-esophageal reflux disease without esophagitis: Secondary | ICD-10-CM | POA: Diagnosis not present

## 2018-03-26 DIAGNOSIS — R918 Other nonspecific abnormal finding of lung field: Secondary | ICD-10-CM | POA: Diagnosis present

## 2018-03-26 DIAGNOSIS — C3400 Malignant neoplasm of unspecified main bronchus: Secondary | ICD-10-CM | POA: Insufficient documentation

## 2018-03-26 DIAGNOSIS — I252 Old myocardial infarction: Secondary | ICD-10-CM | POA: Insufficient documentation

## 2018-03-26 DIAGNOSIS — E876 Hypokalemia: Secondary | ICD-10-CM | POA: Insufficient documentation

## 2018-03-26 DIAGNOSIS — Z79899 Other long term (current) drug therapy: Secondary | ICD-10-CM | POA: Insufficient documentation

## 2018-03-26 HISTORY — PX: FLEXIBLE BRONCHOSCOPY: SHX5094

## 2018-03-26 HISTORY — PX: ENDOBRONCHIAL ULTRASOUND: SHX5096

## 2018-03-26 SURGERY — ENDOBRONCHIAL ULTRASOUND (EBUS)
Anesthesia: General

## 2018-03-26 MED ORDER — SUGAMMADEX SODIUM 200 MG/2ML IV SOLN
INTRAVENOUS | Status: DC | PRN
  Administered 2018-03-26: 200 mg via INTRAVENOUS

## 2018-03-26 MED ORDER — MIDAZOLAM HCL 2 MG/2ML IJ SOLN
INTRAMUSCULAR | Status: DC | PRN
  Administered 2018-03-26 (×2): 1 mg via INTRAVENOUS

## 2018-03-26 MED ORDER — DEXAMETHASONE SODIUM PHOSPHATE 10 MG/ML IJ SOLN
INTRAMUSCULAR | Status: DC | PRN
  Administered 2018-03-26: 8 mg via INTRAVENOUS

## 2018-03-26 MED ORDER — PROPOFOL 10 MG/ML IV BOLUS
INTRAVENOUS | Status: AC
  Filled 2018-03-26: qty 20

## 2018-03-26 MED ORDER — ONDANSETRON HCL 4 MG/2ML IJ SOLN
INTRAMUSCULAR | Status: DC | PRN
  Administered 2018-03-26: 4 mg via INTRAVENOUS

## 2018-03-26 MED ORDER — ROCURONIUM BROMIDE 50 MG/5ML IV SOLN
INTRAVENOUS | Status: AC
  Filled 2018-03-26: qty 1

## 2018-03-26 MED ORDER — LIDOCAINE HCL (PF) 1 % IJ SOLN
INTRAMUSCULAR | Status: AC
  Filled 2018-03-26: qty 5

## 2018-03-26 MED ORDER — SUGAMMADEX SODIUM 200 MG/2ML IV SOLN
INTRAVENOUS | Status: AC
  Filled 2018-03-26: qty 2

## 2018-03-26 MED ORDER — DEXAMETHASONE SODIUM PHOSPHATE 10 MG/ML IJ SOLN
INTRAMUSCULAR | Status: AC
  Filled 2018-03-26: qty 1

## 2018-03-26 MED ORDER — FENTANYL CITRATE (PF) 100 MCG/2ML IJ SOLN: 25.0000 ug | INTRAMUSCULAR | Status: DC | PRN

## 2018-03-26 MED ORDER — LACTATED RINGERS IV SOLN
INTRAVENOUS | Status: DC
  Administered 2018-03-26: 13:00:00 via INTRAVENOUS

## 2018-03-26 MED ORDER — PHENYLEPHRINE HCL 10 MG/ML IJ SOLN
INTRAMUSCULAR | Status: DC | PRN
  Administered 2018-03-26: 100 ug via INTRAVENOUS
  Administered 2018-03-26 (×2): 150 ug via INTRAVENOUS
  Administered 2018-03-26 (×3): 100 ug via INTRAVENOUS

## 2018-03-26 MED ORDER — ONDANSETRON HCL 4 MG/2ML IJ SOLN
INTRAMUSCULAR | Status: AC
  Filled 2018-03-26: qty 2

## 2018-03-26 MED ORDER — EPHEDRINE SULFATE 50 MG/ML IJ SOLN
INTRAMUSCULAR | Status: DC | PRN
  Administered 2018-03-26: 10 mg via INTRAVENOUS

## 2018-03-26 MED ORDER — ROCURONIUM BROMIDE 100 MG/10ML IV SOLN
INTRAVENOUS | Status: DC | PRN
  Administered 2018-03-26: 50 mg via INTRAVENOUS

## 2018-03-26 MED ORDER — FENTANYL CITRATE (PF) 100 MCG/2ML IJ SOLN
INTRAMUSCULAR | Status: DC | PRN
  Administered 2018-03-26: 75 ug via INTRAVENOUS
  Administered 2018-03-26: 25 ug via INTRAVENOUS

## 2018-03-26 MED ORDER — PHENYLEPHRINE HCL 10 MG/ML IJ SOLN
INTRAMUSCULAR | Status: AC
  Filled 2018-03-26: qty 1

## 2018-03-26 MED ORDER — LIDOCAINE HCL (CARDIAC) PF 100 MG/5ML IV SOSY
PREFILLED_SYRINGE | INTRAVENOUS | Status: DC | PRN
  Administered 2018-03-26: 70 mg via INTRAVENOUS

## 2018-03-26 MED ORDER — EPHEDRINE SULFATE 50 MG/ML IJ SOLN
INTRAMUSCULAR | Status: AC
  Filled 2018-03-26: qty 1

## 2018-03-26 MED ORDER — OXYCODONE HCL 5 MG PO TABS: 5.0000 mg | ORAL_TABLET | Freq: Once | ORAL | Status: DC | PRN

## 2018-03-26 MED ORDER — PROPOFOL 10 MG/ML IV BOLUS
INTRAVENOUS | Status: DC | PRN
  Administered 2018-03-26: 150 mg via INTRAVENOUS

## 2018-03-26 MED ORDER — FENTANYL CITRATE (PF) 100 MCG/2ML IJ SOLN
INTRAMUSCULAR | Status: AC
  Filled 2018-03-26: qty 2

## 2018-03-26 MED ORDER — MIDAZOLAM HCL 2 MG/2ML IJ SOLN
INTRAMUSCULAR | Status: AC
  Filled 2018-03-26: qty 2

## 2018-03-26 NOTE — Anesthesia Postprocedure Evaluation (Signed)
Anesthesia Post Note  Patient: Timothy Yoder  Procedure(s) Performed: ENDOBRONCHIAL ULTRASOUND (N/A ) FLEXIBLE BRONCHOSCOPY (N/A )  Patient location during evaluation: PACU Anesthesia Type: General Level of consciousness: awake and alert and oriented Pain management: pain level controlled Vital Signs Assessment: post-procedure vital signs reviewed and stable Respiratory status: spontaneous breathing, nonlabored ventilation and respiratory function stable Cardiovascular status: blood pressure returned to baseline and stable Postop Assessment: no signs of nausea or vomiting Anesthetic complications: no     Last Vitals:  Vitals:   03/26/18 1530 03/26/18 1541  BP: 135/89 (!) 143/88  Pulse: 77 81  Resp: 15 16  Temp:  (!) 36 C  SpO2: 95% 96%    Last Pain:  Vitals:   03/26/18 1541  TempSrc: Temporal  PainSc: 0-No pain                 Zong Mcquarrie

## 2018-03-26 NOTE — Anesthesia Preprocedure Evaluation (Addendum)
Anesthesia Evaluation  Patient identified by MRN, date of birth, ID band Patient awake    Reviewed: Allergy & Precautions, H&P , NPO status , Patient's Chart, lab work & pertinent test results  Airway Mallampati: III       Dental  (+) Upper Dentures, Lower Dentures   Pulmonary shortness of breath and with exertion, COPD, former smoker,           Cardiovascular Exercise Tolerance: Good hypertension, (-) angina+ CAD, + Past MI (2016) and + Cardiac Stents (2016)    Stress Echo 09/13/14:      Normal  left ventricular  ejection fraction  (40-81%)     Diastolic  left ventricular  Dysfunction (not graded)     Normal  right ventricular  contractile performance     Decreased  central venous  and right atrial  pressures    Neuro/Psych negative neurological ROS  negative psych ROS   GI/Hepatic Neg liver ROS, GERD  ,  Endo/Other  negative endocrine ROS  Renal/GU      Musculoskeletal   Abdominal   Peds  Hematology negative hematology ROS (+)   Anesthesia Other Findings Past Medical History: No date: BPH (benign prostatic hyperplasia) No date: CAD (coronary artery disease)     Comment:  a. 04/2014 anterolateral STEMI/PCI: LM nl, LAD 70m               (2.75x16 Promus DES, D1 50p, LCX nl, RI nl, OM1 100               small/chronic, RCA dominant, 20-41m, 53m/d (FFR 0.90->Med              Rx), RPDA 50, RPLA 70, EF 60%. No date: COPD (chronic obstructive pulmonary disease) (HCC)     Comment:  Dr Raul Del No date: Former tobacco use No date: Gastroesophageal reflux disease No date: HTN (hypertension) No date: Hyperlipidemia No date: Hypokalemia 04/2014: Myocardial infarction [facility])     Comment:  Dr Sherren Mocha No date: Stented coronary artery  Past Surgical History: No date: CARDIAC CATHETERIZATION 02/15/2016: CHOLECYSTECTOMY; N/A     Comment:  Procedure: LAPAROSCOPIC CHOLECYSTECTOMY WITH               INTRAOPERATIVE  CHOLANGIOGRAM;  Surgeon: Leonie Green, MD;  Location: [facility] OR[facility];  Service: General;                Laterality: N/A; No date: COLONOSCOPY 08/02/2015: COLONOSCOPY WITH PROPOFOL; N/A     Comment:  Procedure: COLONOSCOPY WITH PROPOFOL;  Surgeon: Manya Silvas, MD;  Location: [place];  Service:               Endoscopy;  Laterality: N/A; 08/02/2015: ESOPHAGOGASTRODUODENOSCOPY (EGD) WITH PROPOFOL; N/A     Comment:  Procedure: ESOPHAGOGASTRODUODENOSCOPY (EGD) WITH               PROPOFOL;  Surgeon: Manya Silvas, MD;  Location: [facility]              ENDOSCOPY;  Service: Endoscopy;  Laterality: N/A; No date: KNEE ARTHROSCOPY; Left 05/03/2014: LEFT HEART CATHETERIZATION WITH CORONARY ANGIOGRAM; N/A     Comment:  Procedure: LEFT HEART CATHETERIZATION WITH CORONARY               ANGIOGRAM;  Surgeon: Blane Ohara, MD;  Location: [facility]  CATH LAB;  Service: Cardiovascular;  Laterality: N/A; 05/05/2014: PERCUTANEOUS CORONARY STENT INTERVENTION (PCI-S); N/A     Comment:  Procedure: PERCUTANEOUS CORONARY STENT INTERVENTION               (PCI-S);  Surgeon: Peter M Martinique, MD;  Location: [facility] CATH              LAB;  Service: Cardiovascular;  Laterality: N/A;     Reproductive/Obstetrics negative OB ROS                            Anesthesia Physical Anesthesia Plan  ASA: III  Anesthesia Plan: General ETT   Post-op Pain Management:    Induction:   PONV Risk Score and Plan: Ondansetron, Dexamethasone and Treatment may vary due to age or medical condition  Airway Management Planned: Oral ETT  Additional Equipment:   Intra-op Plan:   Post-operative Plan:   Informed Consent: I have reviewed the patients History and Physical, chart, labs and discussed the procedure including the risks, benefits and alternatives for the proposed anesthesia with the patient or authorized representative who has indicated his/her understanding  and acceptance.   Dental Advisory Given  Plan Discussed with: Anesthesiologist  Anesthesia Plan Comments:        Anesthesia Quick Evaluation

## 2018-03-26 NOTE — Discharge Instructions (Signed)

## 2018-03-26 NOTE — Telephone Encounter (Signed)
If his lymph node is involved, will proceed with MRI. We should know that in a few days.

## 2018-03-26 NOTE — Op Note (Signed)
PROCEDURE: ENDOBRONCHIAL ULTRASOUND   PROCEDURE DATE: 03/26/2018  TIME:  NAME:  Timothy Yoder  DOB:Feb 07, 1956  MRN: [id number] LOC:  [facility]/None    HOSP DAY:  Code Status History    Date Active Date Inactive Code Status Order ID Comments User Context   05/05/2014 1713 05/06/2014 1500 Full Code [id number]  Martinique, Peter M, MD Inpatient   05/03/2014 1901 05/05/2014 1713 Full Code [id number]  Blane Ohara, MD Inpatient          Indications/Preliminary Diagnosis:Adenopathy  Consent: (Place X beside choice/s below)  The benefits, risks and possible complications of the procedure were        explained to:  _x__ patient  _x__ patient's family  ___ other:___________  who verbalized understanding and gave:  ___ verbal  ___ written  _x__ verbal and written  ___ telephone  ___ other:________ consent.      Unable to obtain consent; procedure performed on emergent basis.     Other:       PRESEDATION ASSESSMENT: History and Physical has been performed. Patient meds and allergies have been reviewed. Presedation airway examination has been performed and documented. Baseline vital signs, sedation score, oxygenation status, and cardiac rhythm were reviewed. Patient was deemed to be in satisfactory condition to undergo the procedure.    PREMEDICATIONS: SEE ANESTHESIOLOGY RECORDS   Insertion Route (Place X beside choice below)   Nasal   Oral  x Endotracheal Tube   Tracheostomy   INTRAPROCEDURE MEDICATIONS: SEE ANESTHESIOLOGY RECORDS   PROCEDURE DETAILS: Timeout performed and correct patient, name, & ID confirmed. Following prep per Pulmonary policy, appropriate sedation was administered.  I proceeded with introducing the endobronchial [place] scope and findings, technical procedures, and specimen collection as noted below. At the end of exam the scope was withdrawn without incident. Impression and Plan as noted below.   SPECIMENS (Sites): (Place X beside choice below)  Specimens Description   No Specimens Obtained     Washings    Lavage    Biopsies    Fine Needle Aspirates    Brushings    Sputum    FINDINGS: large 3x3cm  cystic mass found in subcarinal area 10 cc's of serous fluid was aspirated, then 4 cc's of blood aspirated I proceeded to take additional samples from cystic mass  ESTIMATED BLOOD LOSS: none COMPLICATIONS/RESOLUTION: none       IMPRESSION:POST-PROCEDURE DX: subcarinal cystic mass    RECOMMENDATION/PLAN:  Follow up Pathology Reports     Corrin Parker, M.D.  [facility]  Medical Director [place] Director [place] Department

## 2018-03-26 NOTE — Op Note (Signed)
Electromagnetic Navigation Bronchoscopy: Indication: lung mass/nodule  Preoperative Diagnosis:lung nodule/mass Post Procedure Diagnosis:lung nodule/mass Consent: Verbal/Written  The Risks and Benefits of the procedure explained to patient/family prior to start of procedure and I have discussed the risk for acute bleeding, increased chance of infection, increased chance of respiratory failure and cardiac arrest and death.  I have also explained to avoid all types of NSAIDs to decrease chance of bleeding, and to avoid food and drinks the midnight prior to procedure.  The procedure consists of a video camera with a light source to be placed and inserted  into the lungs to  look for abnormal tissue and to obtain tissue samples by using needle and biopsy tools.  The patient/family understand the risks and benefits and have agreed to proceed with procedure.   Hand washing performed prior to starting the procedure.   Type of Anesthesia: see Anesthesiology records .   Procedure Performed:  Virtual Bronchoscopy with Multi-planar Image analysis, 3-D reconstruction of coronal, sagittal and multi-planar images for the purposes of planning real-time bronchoscopy using the iLogic Electromagnetic Navigation Bronchoscopy System (superDimension).  Description of Procedure: After obtaining informed consent from the patient, the above sedative and anesthetic measures were carried out, flexible fiberoptic bronchoscope was inserted via Endotracheal tube after patient was intubated by CNA/Anesthesiologist.   The virtual camera was then placed into the central portion of the trachea. The trachea itself was inspected.  The main carina, right and left midstem bronchus and all the segmental and subsegmental airways by virtual bronchoscopy were inspected. The camera was directed to standard registration points at the following centers: main carina, right upper lobe bronchus, right lower lobe bronchus, right middle  lobe bronchus, left upper lobe bronchus, and the left lower lobe bronchus. This data was transferred to the i-Logic ENB system for real-time bronchoscopy.   The scope was then navigated to the RLL lung  mass for tissue sampling  Specimans Obtained:  Transbronchial Fine Needle Aspirations 21G times:4  Transbronchial Forceps Biopsy times:12    Fluoroscopy:  Fluoroscopy was utilized during the course of this procedure to assure that biopsies were taken in a safe manner under fluoroscopic guidance with spot films required.   Complications:None  Estimated Blood Loss: minimal approx 1cc  Monitoring:  The patient was monitored with continuous oximetry and received supplemental nasal cannula oxygen throughout the procedure. In addition, serial blood pressure measurements and continuous electrocardiography showed these physiologic parameters to remain tolerable throughout the procedure.   Assessment and Plan/Additional Comments: Follow up Pathology Reports    Corrin Parker, M.D.  [facility]  Medical Director [place] Director [facility] Cardio-Pulmonary Department

## 2018-03-26 NOTE — Transfer of Care (Signed)
Immediate Anesthesia Transfer of Care Note  Patient: Timothy Yoder  Procedure(s) Performed: ENDOBRONCHIAL ULTRASOUND (N/A ) FLEXIBLE BRONCHOSCOPY (N/A )  Patient Location: PACU  Anesthesia Type:General  Level of Consciousness: awake, alert , oriented and patient cooperative  Airway & Oxygen Therapy: Patient Spontanous Breathing and Patient connected to face mask oxygen  Post-op Assessment: Report given to RN and Post -op Vital signs reviewed and stable  Post vital signs: Reviewed and stable  Last Vitals:  Vitals Value Taken Time  BP 136/87 03/26/2018  2:45 PM  Temp    Pulse 96 03/26/2018  2:46 PM  Resp    SpO2 98 % 03/26/2018  2:46 PM  Vitals shown include unvalidated device data.  Last Pain:  Vitals:   03/26/18 1228  PainSc: 0-No pain         Complications: No apparent anesthesia complications

## 2018-03-26 NOTE — Anesthesia Procedure Notes (Signed)
Procedure Name: Intubation Date/Time: 03/26/2018 1:24 PM Performed by: Eben Burow, CRNA Pre-anesthesia Checklist: Patient identified, Emergency Drugs available, Suction available and Patient being monitored Patient Re-evaluated:Patient Re-evaluated prior to induction Oxygen Delivery Method: Circle system utilized Preoxygenation: Pre-oxygenation with 100% oxygen Induction Type: IV induction Ventilation: Mask ventilation without difficulty and Oral airway inserted - appropriate to patient size Laryngoscope Size: Sabra Heck and 2 Grade View: Grade I Tube type: Oral Tube size: 8.5 mm Number of attempts: 1 Airway Equipment and Method: Stylet and LTA kit utilized Placement Confirmation: positive ETCO2,  ETT inserted through vocal cords under direct vision and breath sounds checked- equal and bilateral Secured at: 22 cm Tube secured with: Tape Dental Injury: Teeth and Oropharynx as per pre-operative assessment

## 2018-03-26 NOTE — Progress Notes (Signed)
Ice chips given

## 2018-03-26 NOTE — Interval H&P Note (Signed)
History and Physical Interval Note:  03/26/2018 12:45 PM  Timothy Yoder  has presented today for surgery, with the diagnosis of SUBCARINAL ADENOPATHY,LUNG MASS  The various methods of treatment have been discussed with the patient and family. After consideration of risks, benefits and other options for treatment, the patient has consented to  Procedure(s): ENDOBRONCHIAL ULTRASOUND (N/A) FLEXIBLE BRONCHOSCOPY (N/A) as a surgical intervention .  As well as electromagnetic navigational Bronchoscopy The patient's history has been reviewed, patient examined, no change in status, stable for surgery.  I have reviewed the patient's chart and labs.  Questions were answered to the patient's satisfaction.     Flora Lipps

## 2018-03-26 NOTE — Telephone Encounter (Addendum)
Wife called and asks that we go ahead and schedule the Brain scan discussed to be done before his appointment on 04/01/18. Please advise

## 2018-03-26 NOTE — Anesthesia Post-op Follow-up Note (Signed)
Anesthesia QCDR form completed.        

## 2018-03-27 ENCOUNTER — Encounter: Payer: Self-pay | Admitting: Internal Medicine

## 2018-03-27 NOTE — Telephone Encounter (Signed)
Call returned to Timothy Yoder and explained that Dr Tasia Catchings is awaiting results of biopsy from yesterday. She was understanding of this

## 2018-03-28 LAB — CYTOLOGY - NON PAP

## 2018-03-28 LAB — SURGICAL PATHOLOGY

## 2018-03-29 ENCOUNTER — Telehealth: Payer: Self-pay | Admitting: *Deleted

## 2018-03-29 DIAGNOSIS — C3490 Malignant neoplasm of unspecified part of unspecified bronchus or lung: Secondary | ICD-10-CM

## 2018-03-29 LAB — CYTOLOGY - NON PAP

## 2018-03-29 NOTE — Telephone Encounter (Signed)
Pt is aware of biopsy results. States saw them on his MyChart. Pt informed that since subcarinal node is involved then will go ahead to plan concurrent chemo/rads per Dr. Tasia Catchings. Brain MRI has been ordered as well. Pt will be informed of appts at next clinic visit Monday 12/9. Pt verbalized understanding.

## 2018-04-01 ENCOUNTER — Ambulatory Visit (INDEPENDENT_AMBULATORY_CARE_PROVIDER_SITE_OTHER): Payer: BLUE CROSS/BLUE SHIELD | Admitting: Cardiothoracic Surgery

## 2018-04-01 ENCOUNTER — Encounter: Payer: Self-pay | Admitting: *Deleted

## 2018-04-01 ENCOUNTER — Encounter: Payer: Self-pay | Admitting: Oncology

## 2018-04-01 ENCOUNTER — Other Ambulatory Visit: Payer: Self-pay

## 2018-04-01 ENCOUNTER — Inpatient Hospital Stay: Payer: BLUE CROSS/BLUE SHIELD | Attending: Oncology | Admitting: Oncology

## 2018-04-01 ENCOUNTER — Encounter: Payer: Self-pay | Admitting: Cardiothoracic Surgery

## 2018-04-01 ENCOUNTER — Ambulatory Visit
Admission: RE | Admit: 2018-04-01 | Discharge: 2018-04-01 | Disposition: A | Discharge status: Home / Self Care | Payer: BLUE CROSS/BLUE SHIELD | Source: Ambulatory Visit | Attending: Oncology | Admitting: Oncology

## 2018-04-01 ENCOUNTER — Telehealth: Payer: Self-pay | Admitting: Cardiovascular Disease

## 2018-04-01 VITALS — BP 142/83 | HR 92 | Temp 96.5°F | Resp 18 | Wt 163.1 lb

## 2018-04-01 VITALS — BP 173/89 | HR 67 | Temp 97.7°F | Resp 16 | Ht 68.0 in | Wt 162.0 lb

## 2018-04-01 DIAGNOSIS — R918 Other nonspecific abnormal finding of lung field: Secondary | ICD-10-CM

## 2018-04-01 DIAGNOSIS — C3490 Malignant neoplasm of unspecified part of unspecified bronchus or lung: Secondary | ICD-10-CM | POA: Diagnosis present

## 2018-04-01 DIAGNOSIS — C3431 Malignant neoplasm of lower lobe, right bronchus or lung: Secondary | ICD-10-CM | POA: Insufficient documentation

## 2018-04-01 DIAGNOSIS — Z7189 Other specified counseling: Secondary | ICD-10-CM | POA: Insufficient documentation

## 2018-04-01 HISTORY — DX: Malignant neoplasm of lower lobe, right bronchus or lung: C34.31

## 2018-04-01 HISTORY — DX: Malignant neoplasm of unspecified part of unspecified bronchus or lung: C34.90

## 2018-04-01 MED ORDER — ONDANSETRON HCL 8 MG PO TABS: 8.0000 mg | ORAL_TABLET | Freq: Two times a day (BID) | ORAL | 1 refills | Status: DC | PRN

## 2018-04-01 MED ORDER — GADOBUTROL 1 MMOL/ML IV SOLN
7.5000 mL | Freq: Once | INTRAVENOUS | Status: AC | PRN
  Administered 2018-04-01: 7.5 mL via INTRAVENOUS

## 2018-04-01 MED ORDER — LIDOCAINE-PRILOCAINE 2.5-2.5 % EX CREA: TOPICAL_CREAM | CUTANEOUS | 3 refills | Status: DC

## 2018-04-01 MED ORDER — PROCHLORPERAZINE MALEATE 10 MG PO TABS: 10.0000 mg | ORAL_TABLET | Freq: Four times a day (QID) | ORAL | 1 refills | Status: DC | PRN

## 2018-04-01 NOTE — Progress Notes (Signed)
Patient here for follow up

## 2018-04-01 NOTE — Progress Notes (Signed)
Hematology/Oncology follow up note [facility] Telephone:([phone] Fax:([phone]) [phone]   Patient Care Team: Baxter Hire, MD as PCP - General (Internal Medicine) Sherren Mocha, MD as PCP - Cardiology (Cardiology) Telford Nab, RN as Registered Nurse  REFERRING PROVIDER: Dr.Fleming REASON FOR VISIT:  Follow-up for management of stage III non-small cell lung cancer   HISTORY OF PRESENTING ILLNESS:  Timothy Yoder is a  62 y.o.  male with PMH listed below who was referred to me for evaluation of lung mass.  Patient is a former smoker quit smoking 7 years ago.  40-pack-year. Patient chest lung cancer screening on 03/11/2018. CT scan showed 2.4 cm right lower lobe nodule with a new necrotic.  Subcarinal lymph node. PET scan showed hypermetabolic right lower pulmonary nodule, consistent with primary bronchogenic carcinoma.  Fluid density subcarinal structure demonstrate no significant hypermetabolism.  Although this could represent an incidental benign lesion such as a cyst given the interval development since 02/24/2017, necrotic adenopathy cannot be excluded and tissue sampling should be considered.  Patient was seen by Dr. Faith Rogue for initial evaluation on 03/13/2018.  Patient has not got PET scan at that time. Today patient was accompanied by his wife to the oncology clinic to discuss management plan.  And PET scan results. Patient reports chronic shortness of breath with moderate exertion.  Appetite is good denies any fever, chills, hemoptysis, weight loss.  INTERVAL HISTORY Timothy Yoder is a 62 y.o. male who has above history reviewed by me today presents for follow up visit for management of stage III non-small cell lung cancer During interval patient underwent biopsy via bronchoscopy.  Subcarinal lymph node biopsy positive for non-small cell lung cancer, favoring poorly differentiated adenocarcinoma. Patient is accompanied by his wife to clinic to  discuss results.  No new complaints.  Chronic shortness of breath.  Review of Systems  Constitutional: Negative for appetite change, chills, fatigue, fever and unexpected weight change.  HENT:   Negative for hearing loss and voice change.   Eyes: Negative for eye problems and icterus.  Respiratory: Positive for shortness of breath. Negative for chest tightness and cough.   Cardiovascular: Negative for chest pain and leg swelling.  Gastrointestinal: Negative for abdominal distention and abdominal pain.  Endocrine: Negative for hot flashes.  Genitourinary: Negative for difficulty urinating, dysuria and frequency.   Musculoskeletal: Negative for arthralgias.  Skin: Negative for itching and rash.  Neurological: Negative for light-headedness and numbness.  Hematological: Negative for adenopathy. Does not bruise/bleed easily.  Psychiatric/Behavioral: Negative for confusion.   MEDICAL HISTORY:  Past Medical History:  Diagnosis Date  . BPH (benign prostatic hyperplasia)   . CAD (coronary artery disease)    a. 04/2014 anterolateral STEMI/PCI: LM nl, LAD 11m(2.75x16 Promus DES, D1 50p, LCX nl, RI nl, OM1 100 small/chronic, RCA dominant, 20-333m7066m(FFR 0.90->Med Rx), RPDA 50, RPLA 70, EF 60%.  . CMarland KitchenPD (chronic obstructive pulmonary disease) (HCC)    Dr FleRaul Del Former tobacco use   . Gastroesophageal reflux disease   . HTN (hypertension)   . Hyperlipidemia   . Hypokalemia   . [place]  . [place]   Dr MicSherren Mocha Stented coronary artery     SURGICAL HISTORY: Past Surgical History:  Procedure Laterality Date  . CARDIAC CATHETERIZATION    . CHOLECYSTECTOMY N/A 02/15/2016   Procedure: LAPAROSCOPIC CHOLECYSTECTOMY WITH INTRAOPERATIVE CHOLANGIOGRAM;  Surgeon: JarLeonie GreenD;  Location: [facility] ORS;  Service: General;  Laterality: N/A;  . COLONOSCOPY    . COLONOSCOPY WITH PROPOFOL N/A 08/02/2015   Procedure:  COLONOSCOPY WITH PROPOFOL;  Surgeon: Manya Silvas, MD;  Location[facility] ENDOSCOPY;  Service: Endoscopy;  Laterality: N/A;  . ENDOBRONCHIAL ULTRASOUND N/A 03/26/2018   Procedure: ENDOBRONCHIAL ULTRASOUND;  Surgeon: Flora Lipps, MD;  Location: [facility] ORS;  Service: Cardiopulmonary;  Laterality: N/A;  . ESOPHAGOGASTRODUODENOSCOPY (EGD) WITH PROPOFOL N/A 08/02/2015   Procedure: ESOPHAGOGASTRODUODENOSCOPY (EGD) WITH PROPOFOL;  Surgeon: Manya Silvas, MD;  Location: [facility] ENDOSCOPY;  Service: Endoscopy;  Laterality: N/A;  . FLEXIBLE BRONCHOSCOPY N/A 03/26/2018   Procedure: FLEXIBLE BRONCHOSCOPY;  Surgeon: Flora Lipps, MD;  Location: [facility] ORS;  Service: Cardiopulmonary;  Laterality: N/A;  . KNEE ARTHROSCOPY Left   . LEFT HEART CATHETERIZATION WITH CORONARY ANGIOGRAM N/A 05/03/2014   Procedure: LEFT HEART CATHETERIZATION WITH CORONARY ANGIOGRAM;  Surgeon: Blane Ohara, MD;  Location: [facility] CATH LAB;  Service: Cardiovascular;  Laterality: N/A;  . PERCUTANEOUS CORONARY STENT INTERVENTION (PCI-S) N/A 05/05/2014   Procedure: PERCUTANEOUS CORONARY STENT INTERVENTION (PCI-S);  Surgeon: Peter M Martinique, MD;  Location: [facility] CATH LAB;  Service: Cardiovascular;  Laterality: N/A;    SOCIAL HISTORY: Social History   Socioeconomic History  . Marital status: Married    Spouse name: Not on file  . Number of children: Not on file  . Years of education: Not on file  . Highest education level: Not on file  Occupational History  . Not on file  Social Needs  . Financial resource strain: Not on file  . Food insecurity:    Worry: Not on file    Inability: Not on file  . Transportation needs:    Medical: Not on file    Non-medical: Not on file  Tobacco Use  . Smoking status: Former Smoker    Packs/day: 1.00    Years: 40.00    Pack years: 40.00    Types: Cigarettes    Last attempt to quit: 02/14/2011    Years since quitting: 7.1  . Smokeless tobacco: Never Used  Substance and Sexual Activity  . Alcohol use:  No  . Drug use: No  . Sexual activity: Not on file  Lifestyle  . Physical activity:    Days per week: Not on file    Minutes per session: Not on file  . Stress: Not on file  Relationships  . Social connections:    Talks on phone: Not on file    Gets together: Not on file    Attends religious service: Not on file    Active member of club or organization: Not on file    Attends meetings of clubs or organizations: Not on file    Relationship status: Not on file  . Intimate partner violence:    Fear of current or ex partner: Not on file    Emotionally abused: Not on file    Physically abused: Not on file    Forced sexual activity: Not on file  Other Topics Concern  . Not on file  Social History Narrative   The patient is married. He lives in [place]. He works full-time. He quit smoking in 2013.    FAMILY HISTORY: Family History  Problem Relation Age of Onset  . CAD Mother 72       Died of an MI  . CAD Father 4       Died of a massive MI  . COPD Sister   . Lung cancer Brother   .  Bone cancer Paternal Uncle   . Colon cancer Neg Hx   . Breast cancer Neg Hx     ALLERGIES:  has No Known Allergies.  MEDICATIONS:  Current Outpatient Medications  Medication Sig Dispense Refill  . acetaminophen (TYLENOL) 500 MG tablet Take 1,000 mg by mouth every 6 (six) hours as needed (for pain.).    Marland Kitchen albuterol (PROAIR HFA) 108 (90 Base) MCG/ACT inhaler Inhale 2 puffs into the lungs every 6 (six) hours as needed for wheezing or shortness of breath.     Marland Kitchen atorvastatin (LIPITOR) 80 MG tablet Take 1 tablet (80 mg total) by mouth daily at 6 PM. 90 tablet 3  . carvedilol (COREG) 6.25 MG tablet Take 1 tablet (6.25 mg total) by mouth 2 (two) times daily. 180 tablet 3  . lisinopril (PRINIVIL,ZESTRIL) 10 MG tablet Take 1 tablet (10 mg total) by mouth daily. 90 tablet 3  . nitroGLYCERIN (NITROSTAT) 0.4 MG SL tablet Place 1 tablet (0.4 mg total) under the tongue every 5 (five) minutes as needed for  chest pain. X 3 doses 25 tablet 3  . omeprazole (PRILOSEC) 40 MG capsule Take 40 mg by mouth 2 (two) times daily.     . potassium chloride SA (KLOR-CON M20) 20 MEQ tablet Take 1 tablet (20 mEq total) by mouth daily. 90 tablet 1  . ranitidine (ZANTAC) 150 MG tablet Take 150 mg by mouth 2 (two) times daily.    . tamsulosin (FLOMAX) 0.4 MG CAPS capsule Take 0.4 mg by mouth daily after supper.    . umeclidinium-vilanterol (ANORO ELLIPTA) 62.5-25 MCG/INH AEPB Inhale 1 puff into the lungs every morning.    . colestipol (COLESTID) 1 g tablet Take 2 g by mouth daily as needed (stomach issues).     Marland Kitchen lidocaine-prilocaine (EMLA) cream Apply to affected area once 30 g 3  . ondansetron (ZOFRAN) 8 MG tablet Take 1 tablet (8 mg total) by mouth 2 (two) times daily as needed for refractory nausea / vomiting. Start on day 3 after chemo. 30 tablet 1  . prochlorperazine (COMPAZINE) 10 MG tablet Take 1 tablet (10 mg total) by mouth every 6 (six) hours as needed (Nausea or vomiting). 30 tablet 1   No current facility-administered medications for this visit.      PHYSICAL EXAMINATION: ECOG PERFORMANCE STATUS: 0 - Asymptomatic Vitals:   04/01/18 1327  BP: (!) 142/83  Pulse: 92  Resp: 18  Temp: (!) 96.5 F (35.8 C)   Filed Weights   04/01/18 1327  Weight: 163 lb 1.6 oz (74 kg)    Physical Exam  Constitutional: He is oriented to person, place, and time. No distress.  HENT:  Head: Normocephalic and atraumatic.  Mouth/Throat: Oropharynx is clear and moist.  Eyes: Pupils are equal, round, and reactive to light. EOM are normal. No scleral icterus.  Neck: Normal range of motion. Neck supple.  Cardiovascular: Normal rate, regular rhythm and normal heart sounds.  Pulmonary/Chest: Effort normal. No respiratory distress. He has no wheezes.  Decreased breath sounds bilaterally.  Abdominal: Soft. Bowel sounds are normal. He exhibits no distension and no mass. There is no tenderness.  Musculoskeletal: Normal  range of motion. He exhibits no edema or deformity.  Neurological: He is alert and oriented to person, place, and time. No cranial nerve deficit. Coordination normal.  Skin: Skin is warm and dry. No rash noted. No erythema.  Psychiatric: He has a normal mood and affect. His behavior is normal. Thought content normal.  LABORATORY DATA:  I have reviewed the data as listed Lab Results  Component Value Date   WBC 7.6 03/25/2018   HGB 15.3 03/25/2018   HCT 46.1 03/25/2018   MCV 95.1 03/25/2018   PLT 232 03/25/2018   Recent Labs    03/25/18 0820  NA 140  K 3.7  CL 105  CO2 29  GLUCOSE 87  BUN 8  CREATININE 0.91  CALCIUM 9.0  GFRNONAA >60  GFRAA >60   Iron/TIBC/Ferritin/ %Sat No results found for: IRON, TIBC, FERRITIN, IRONPCTSAT   RADIOGRAPHIC STUDIES: I have personally reviewed the radiological images as listed and agreed with the findings in the report. 03/11/2018 CT chest lung screen 1. New 2.4 cm right lower lobe nodule with a new necrotic appearing subcarinal lymph node. Lung-RADS 4B, suspicious. Additional imaging evaluation or consultation with Pulmonology or Thoracic Surgery recommended. These results will be called to the ordering clinician or representative by the Radiologist Assistant, and communication documented in the PACS or zVision Dashboard. 2. Aortic atherosclerosis (ICD10-170.0). Coronary arterycalcification.3.  Emphysema (ICD10-J43.9). 03/13/2018 PET scan # Hypermetabolic right lower lobe pulmonary nodule, consistent with primary bronchogenic carcinoma. Fluid density subcarinal structure demonstrates no significant hypermetabolism. Although this could represent an incidental benign lesion such as a bronchogenic cyst, given interval development since 02/24/2017, necrotic adenopathy cannot be excluded and tissue sampling should be considered.   ASSESSMENT & PLAN:  1. Malignant neoplasm of lower lobe of right lung ([place])   2. Goals of care,  counseling/discussion    Pathology was reviewed and discussed with patient.  Assuming that his MRI brain is negative, he has stage III disease.  he diagnosis of stage III non small cell lung cancer [adenocarcinoma] and care plan were discussed with patient in detail.  NCCN guidelines were reviewed and shared with patient. Goal of care is with curative intent.  I recommend concurrent chemoradiation with weekly carboplatin [AUC 2] and Taxol 99m/m2 followed by systemic immunotherapy with durvalumab. Chemotherapy education was provided.  We had discussed the composition of chemotherapy regimen, length of chemo cycle, duration of treatment and the time to assess response to treatment.  I explained to the patient the risks and benefits of chemotherapy with carboplatin and Taxol  including all but not limited to hair loss, mouth sore, nausea, vomiting, low blood counts, bleeding, infusion reactions and risk of life threatening infection and even death, secondary malignancy etc.  Risk of neuropathy is associated with Taxol. .  # I discussed the mechanism of action and rationale of using immunotherapy.   Discussed the potential side effects of immunotherapy including but not limited to pneumonitis, diarrhea; skin rash; respiratory failure, neurotoxicity, elevated LFTs/endocrine abnormalities etc.Supportive care measures are necessary for patient well-being and will be provided as necessary. Patient voices understanding and willing to proceed chemotherapy and immunotherapy.  #  Chemotherapy education; port placement will be done by Dr.Oaks.. Antiemetics-Zofran and Compazine; EMLA cream sent to pharmacy  # MRI brain has been scheduled and will be done later this week.   We spent sufficient time to discuss many aspect of care, questions were answered to patient's satisfaction.  All questions were answered. The patient knows to call the clinic with any problems questions or concerns.   Return of visit: To be  determined depending on his start of Radiation.  Total face to face encounter time for this patient visit was 45 min. >50% of the time was  spent in counseling and coordination of care.   ZEarlie Server MD, PhD Hematology  [facility]- [phone] 04/01/18

## 2018-04-01 NOTE — Progress Notes (Signed)
Patient's port placement is to be scheduled for 04-04-18 at [facility] with Dr. Genevive Bi.  The patient is aware he will be contacted by the [facility] to complete a phone interview sometime in the near future.  A request for cardiac clearance has been faxed to Dr. Honor Junes office. Patient was recently cleared for a procedure that was done through Dr. Mortimer Fries. Dr. Genevive Bi is requesting cardiac clearance from Dr. Burt Knack as well. I have called Dr. Antionette Char office and spoken with Avaletta regarding need for cardiac clearance for surgery. Patient will be contacted once clearance is received.   The patient is aware to call the office should he have further questions.

## 2018-04-01 NOTE — Telephone Encounter (Signed)
New Message       [facility] Pre-operative Risk Assessment    Request for surgical clearance:  1. What type of surgery is being performed? 04/04/2018   2. When is this surgery scheduled? Insertion of a port-a-cath   3. What type of clearance is required (medical clearance vs. Pharmacy clearance to hold med vs. Both)? Medical Clearance   4. Are there any medications that need to be held prior to surgery and how long? none  5. Practice name and name of physician performing surgery? [facility] Dr. Faith Rogue   6. What is your office phone number [phone]   7.   What is your office fax number [phone]  8.   Anesthesia type (None, local, MAC, general) ? General    Avaletta L Williams 04/01/2018, 12:48 PM  _________________________________________________________________   (provider comments below)

## 2018-04-01 NOTE — Progress Notes (Signed)
  Oncology Nurse Navigator Documentation  Navigator Location: [facility]-Med Onc (04/01/18 1500)   )Navigator Encounter Type: [facility] Follow-up;Diagnostic Results (04/01/18 1500)     Confirmed Diagnosis Date: 03/28/18 (04/01/18 1500)               Patient Visit Type: MedOnc (04/01/18 1500) Treatment Phase: Pre-Tx/Tx Discussion (04/01/18 1500) Barriers/Navigation Needs: Coordination of Care;Education (04/01/18 1500) Education: Understanding Cancer/ Treatment Options;Newly Diagnosed Cancer Education (04/01/18 1500) Interventions: Coordination of Care;Education (04/01/18 1500)   Coordination of Care: Appts;Radiology;Chemo (04/01/18 1500) Education Method: Verbal;Written (04/01/18 1500)           met with patient and his wife during follow up visit with Dr. Tasia Catchings to discuss results from biopsy and treatment options. All questions answered during visit. Reviewed upcoming appts with patient. Pt given resources regarding diagnosis and supportive services available. Instructed pt to call with any further questions or needs. Pt verbalized understanding.      Time Spent with Patient: 45 (04/01/18 1500)

## 2018-04-01 NOTE — Patient Instructions (Signed)

## 2018-04-01 NOTE — Progress Notes (Signed)
START ON PATHWAY REGIMEN - Non-Small Cell Lung     Administer weekly:     Paclitaxel      Carboplatin   **Always confirm dose/schedule in your pharmacy ordering system**  Patient Characteristics: Stage III - Unresectable, PS = 0, 1 AJCC T Category: T1c Current Disease Status: No Distant Mets or Local Recurrence AJCC N Category: N2 AJCC M Category: M0 AJCC 8 Stage Grouping: IIIA Performance Status: PS = 0, 1 Intent of Therapy: Curative Intent, Discussed with Patient

## 2018-04-01 NOTE — Progress Notes (Signed)
  Patient ID: Timothy Yoder, male   DOB: 1955/09/26, 62 y.o.   MRN: [id number]  HISTORY: Timothy Yoder returns today in follow-up.  He did undergo a bronchoscopy and biopsy of the subcarinal mass.  That was positive for adenocarcinoma.  He tolerated that without difficulty and was recently seen by Dr. Tasia Catchings.  She has recommended that he undergo radiation therapy and chemotherapy and he is sent here today for discussion of Port-A-Cath placement.  He does have an extensive history of coronary disease but recently saw his cardiologist and was cleared to undergo the bronchoscopy.  He did have questions regarding the placement of the port and we reviewed all those with him.   Vitals:   04/01/18 1011  BP: (!) 173/89  Pulse: 67  Resp: 16  Temp: 97.7 F (36.5 C)  SpO2: 96%     EXAM:    Resp: Lungs are clear bilaterally.  No respiratory distress, normal effort. Heart:  Regular without murmurs Abd:  Abdomen is soft, non distended and non tender. No masses are palpable.  There is no rebound and no guarding.  Neurological: Alert and oriented to person, place, and time. Coordination normal.  Skin: Skin is warm and dry. No rash noted. No diaphoretic. No erythema. No pallor.  Psychiatric: Normal mood and affect. Normal behavior. Judgment and thought content normal.    ASSESSMENT: Stage III carcinoma of the lung.  I reviewed with him the indications and risks of Port-A-Cath placement.  This can be placed on either right or left side.  Risks of bleeding, infection, pneumothorax and death were all reviewed.   PLAN:   He would like [place] to proceed.  We will go ahead and obtain some routine paperwork today in preparation for his upcoming port placement.  All of his questions were answered.    [name], MD

## 2018-04-01 NOTE — H&P (View-Only) (Signed)
  Patient ID: Timothy Yoder, male   DOB: 08/13/1955, 62 y.o.   MRN: [id number]  HISTORY: Timothy Yoder returns today in follow-up.  Timothy Yoder did undergo a bronchoscopy and biopsy of the subcarinal mass.  That was positive for adenocarcinoma.  Timothy Yoder tolerated that without difficulty and was recently seen by Dr. Tasia Catchings.  She has recommended that Timothy Yoder undergo radiation therapy and chemotherapy and Timothy Yoder is sent here today for discussion of Port-A-Cath placement.  Timothy Yoder does have an extensive history of coronary disease but recently saw his cardiologist and was cleared to undergo the bronchoscopy.  Timothy Yoder did have questions regarding the placement of the port and we reviewed all those with him.   Vitals:   04/01/18 1011  BP: (!) 173/89  Pulse: 67  Resp: 16  Temp: 97.7 F (36.5 C)  SpO2: 96%     EXAM:    Resp: Lungs are clear bilaterally.  No respiratory distress, normal effort. Heart:  Regular without murmurs Abd:  Abdomen is soft, non distended and non tender. No masses are palpable.  There is no rebound and no guarding.  Neurological: Alert and oriented to person, place, and time. Coordination normal.  Skin: Skin is warm and dry. No rash noted. No diaphoretic. No erythema. No pallor.  Psychiatric: Normal mood and affect. Normal behavior. Judgment and thought content normal.    ASSESSMENT: Stage III carcinoma of the lung.  I reviewed with him the indications and risks of Port-A-Cath placement.  This can be placed on either right or left side.  Risks of bleeding, infection, pneumothorax and death were all reviewed.   PLAN:   Timothy Yoder would like [place] to proceed.  We will go ahead and obtain some routine paperwork today in preparation for his upcoming port placement.  All of his questions were answered.    [name], MD

## 2018-04-01 NOTE — Patient Instructions (Addendum)
We have seen you today and have spoken about your port placement. This has been scheduled  at [facility] by Dr. [name].    Please call our office with any questions or concerns that you have.   Port-a-Cath [facility]) A central line is a soft, flexible tube (catheter) that can be used to collect blood for testing or to give medicine or nutrition through a vein. The tip of the central line ends in a large vein just above the heart called the vena cava. A central line may be placed because:  You need to get medicines or fluids through an IV tube for a long period of time.  You need nutrition but cannot eat or absorb nutrients.  The veins in your hands or arms are hard to access.  You need to have blood taken often for blood tests.  You need a blood transfusion  You need chemotherapy or dialysis.  There are many types of central lines:  Peripherally inserted central catheter (PICC) line. This type is used for intermediate access to long-term access of one week or more. It can be used to draw blood and give fluids or medicines. A PICC looks like an IV tube, but it goes up the arm to the heart. It is usually inserted in the upper arm and taped in place on the arm.  Tunneled central line. This type is used for long-term therapy and dialysis. It is placed in a large vein in the neck, chest, or groin. A tunneled central line is inserted through a small incision made over the vein and is advanced into the heart. It is tunneled beneath the skin and brought out through a second incision.  Non-tunneled central line. This type is used for short-term access, usually of a maximum of 7 days. It is often used in the emergency department. A non-tunneled central line is inserted in the neck, chest, or groin.  Implanted port. This type is used for long-term therapy. It can stay in place longer than other types of central lines. An implanted port is normally inserted in the upper chest but can also be  placed in the upper arm or in the abdomen. It is inserted and removed with surgery, and it is accessed using a special needle.  The type of central line that you receive depends on how long you will need it, your medical condition, and the condition of your veins. What are the risks? Using any type of central line has risks that you should be aware of, including:  Infection.  A blood clot that blocks the central line or forms in the vein and travels to the heart.  Bleeding from the place where the central line was put in.  Developing a hole or crack within the central line. If this happens, the central line will need to be replaced.  Developing an abnormal heart rhythm (arrhythmia). This is rare.  Central line failure.  Follow these instructions at home: Flushing and cleaning the central line  Follow instructions from the health care provider about flushing and cleaning the central line.  Wear a mask when flushing or cleaning the central line.  Before you flush or clean the central line: ? Wash your hands with soap and water. ? Clean the central line hub with rubbing alcohol. Insertion site care  Keep the insertion site of your central line clean and dry at all times.  Check your incision or central line site every day for signs of infection. Check  for: ? More redness, swelling, or pain. ? More fluid or blood. ? Warmth. ? Pus or a bad smell. General instructions  Follow instructions from your health care provider for the type of device that you have.  If the central line accidentally gets pulled on, make sure: ? The bandage (dressing) is okay. ? There is no bleeding. ? The line has not been pulled out.  Return to your normal activities as told by your health care provider. Ask your health care provider what activities are safe for you. You may be restricted from lifting or making repetitive arm movements on the side with the catheter.  Do not swim or bathe unless your  health care provider approves.  Keep your dressing dry. Your health care provider can instruct you about how to keep your specific type of dressing from getting wet.  Keep all follow-up visits as told by your health care provider. This is important. Contact a health care provider if:  You have more redness, swelling, or pain around your incision.  You have more fluid or blood coming from your incision.  Your incision feels warm to the touch.  You have pus or a bad smell coming from your incision. Get help right away if:  You have: ? Chills. ? A fever. ? Shortness of breath. ? Trouble breathing. ? Chest pain. ? Swelling in your neck, face, chest, or arm on the side of your central line.  You are coughing.  You feel your heart beating rapidly or skipping beats.  You feel dizzy or you faint.  Your incision or central line site has red streaks spreading away from the area.  Your incision or central line site is bleeding and does not stop.  Your central line is difficult to flush or will not flush.  You do not get a blood return from the central line.  Your central line gets loose or comes out.  Your central line gets damaged.  Your catheter leaks when flushed or when fluids are infused into it. This information is not intended to replace advice given to you by your health care provider. Make sure you discuss any questions you have with your health care provider. Document Released: 06/01/2005 Document Revised: 12/08/2015 Document Reviewed: 11/17/2015 Elsevier Interactive Patient Education  2017 [employer].

## 2018-04-02 ENCOUNTER — Inpatient Hospital Stay: Payer: BLUE CROSS/BLUE SHIELD

## 2018-04-02 ENCOUNTER — Encounter
Admission: RE | Admit: 2018-04-02 | Discharge: 2018-04-02 | Disposition: A | Discharge status: Home / Self Care | Payer: BLUE CROSS/BLUE SHIELD | Source: Ambulatory Visit | Attending: Cardiothoracic Surgery | Admitting: Cardiothoracic Surgery

## 2018-04-02 NOTE — Patient Instructions (Signed)
Your procedure is scheduled on: 04/04/18 Report to Day Surgery.[facility] SECOND FLOOR To find out your arrival time please call [phone] between 1PM - 3PM on 04/08/18.  Remember: Instructions that are not followed completely may result in serious medical risk,  up to and including death, or upon the discretion of your surgeon and anesthesiologist your  surgery may need to be rescheduled.     _X__ 1. Do not eat food after midnight the night before your procedure.                 No gum chewing or hard candies. You may drink clear liquids up to 2 hours                 before you are scheduled to arrive for your surgery- DO not drink clear                 liquids within 2 hours of the start of your surgery.                 Clear Liquids include:  water, apple juice without pulp, clear carbohydrate                 drink such as Clearfast of Gatorade, Black Coffee or Tea (Do not add                 anything to coffee or tea).  __X__2.  On the morning of surgery brush your teeth with toothpaste and water, you                may rinse your mouth with mouthwash if you wish.  Do not swallow any toothpaste of mouthwash.     _X__ 3.  No Alcohol for 24 hours before or after surgery.   _X__ 4.  Do Not Smoke or use e-cigarettes For 24 Hours Prior to Your Surgery.                 Do not use any chewable tobacco products for at least 6 hours prior to                 surgery.  ____  5.  Bring all medications with you on the day of surgery if instructed.   __X__  6.  Notify your doctor if there is any change in your medical condition      (cold, fever, infections).     Do not wear jewelry, make-up, hairpins, clips or nail polish. Do not wear lotions, powders, or perfumes. You may wear deodorant. Do not shave 48 hours prior to surgery. Men may shave face and neck. Do not bring valuables to the hospital.    [facility] is not responsible for any belongings or  valuables.  Contacts, dentures or bridgework may not be worn into surgery. Leave your suitcase in the car. After surgery it may be brought to your room. For patients admitted to the hospital, discharge time is determined by your treatment team.   Patients discharged the day of surgery will not be allowed to drive     ___X_ Take these medicines the morning of surgery with A SIP OF WATER:    1.CARVEDILOL  2. OMEPRAZOLE  3. POTASSIUM  4.  5.  6.  ____ Fleet Enema (as directed)   ____ Use CHG Soap as directed  X___ Use inhalers on the day of surgery  AND BRING  ____ Stop metformin 2 days prior to surgery  ____ Take 1/2 of usual insulin dose the night before surgery. No insulin the morning          of surgery.   ____ Stop Coumadin/Plavix/aspirin on ____ Stop Anti-inflammatories on    ____ Stop supplements until after surgery.    ____ Bring C-Pap to the hospital.

## 2018-04-03 ENCOUNTER — Ambulatory Visit
Admission: RE | Admit: 2018-04-03 | Discharge: 2018-04-03 | Disposition: A | Discharge status: Home / Self Care | Payer: BLUE CROSS/BLUE SHIELD | Source: Ambulatory Visit | Attending: Radiation Oncology | Admitting: Radiation Oncology

## 2018-04-03 ENCOUNTER — Encounter: Payer: Self-pay | Admitting: Radiation Oncology

## 2018-04-03 ENCOUNTER — Telehealth: Payer: Self-pay | Admitting: *Deleted

## 2018-04-03 ENCOUNTER — Other Ambulatory Visit: Payer: Self-pay

## 2018-04-03 VITALS — BP 157/92 | HR 73 | Temp 97.3°F | Resp 18 | Wt 160.3 lb

## 2018-04-03 DIAGNOSIS — C3431 Malignant neoplasm of lower lobe, right bronchus or lung: Secondary | ICD-10-CM | POA: Diagnosis present

## 2018-04-03 DIAGNOSIS — Z87891 Personal history of nicotine dependence: Secondary | ICD-10-CM | POA: Insufficient documentation

## 2018-04-03 NOTE — Telephone Encounter (Signed)
° ° ° °  Provider requesting status, update clearance for 04/04/18 procedure

## 2018-04-03 NOTE — Consult Note (Signed)
NEW PATIENT EVALUATION  Name: Timothy Yoder  MRN: [id number]  Date:   04/03/2018     DOB: 07/24/1955   This 62 y.o. male patient presents to the clinic for initial evaluation of stage IIIa (T2 N2 M0) non-small cell lung cancer of the right lower lobe.  REFERRING PHYSICIAN: Earlie Server, MD  CHIEF COMPLAINT:  Chief Complaint  Patient presents with  . Lung Cancer    Initial consultation of lung cancer    DIAGNOSIS: The encounter diagnosis was Malignant neoplasm of lower lobe of right lung ([place]).   PREVIOUS INVESTIGATIONS:  CT scans and PET/CT scans reviewed Pathology report reviewed Clinical notes reviewed  HPI: patient is a 62 year old male 40 pack year smoking history who was under lung cancer screening which year before had been negative. CT scan November 18 showed a 2.4 cm right lower lobe nodule with a new necrotic subcarinal lymph node. PET CT scan demonstrate hypermetabolic activity in the rightlower pulmonary nodule consistent with primary bronchogenic carcinoma. The subcarinal lymph node was not hypermetabolic but also could've been representing necrosis.patient underwent EBUS with subcarinal node showing non-small cell lung cancer compatible with poorly differentiated adenocarcinoma. He has been seen by medical oncology and is now referred to radiation oncology for opinion. He is doing fairly well specifically denies cough hemoptysis or chest tightness.he has been recommended for concurrent chemoradiation.  PLANNED TREATMENT REGIMEN: concurrent chemoradiation with curative intent  PAST MEDICAL HISTORY:  has a past medical history of BPH (benign prostatic hyperplasia), CAD (coronary artery disease), COPD (chronic obstructive pulmonary disease) ([place]), Former tobacco use, Gastroesophageal reflux disease, HTN (hypertension), Hyperlipidemia, Hypokalemia, Malignant neoplasm of lung ([place]) (04/01/2018), Myocardial infarction ([place]) (04/2014), and Stented coronary artery.    PAST SURGICAL  HISTORY:  Past Surgical History:  Procedure Laterality Date  . CARDIAC CATHETERIZATION    . CHOLECYSTECTOMY N/A 02/15/2016   Procedure: LAPAROSCOPIC CHOLECYSTECTOMY WITH INTRAOPERATIVE CHOLANGIOGRAM;  Surgeon: Leonie Green, MD;  Location: [facility] ORS;  Service: General;  Laterality: N/A;  . COLONOSCOPY    . COLONOSCOPY WITH PROPOFOL N/A 08/02/2015   Procedure: COLONOSCOPY WITH PROPOFOL;  Surgeon: Manya Silvas, MD;  Location: [facility] ENDOSCOPY;  Service: Endoscopy;  Laterality: N/A;  . CORONARY ANGIOPLASTY  04/2014   STENT  . ENDOBRONCHIAL ULTRASOUND N/A 03/26/2018   Procedure: ENDOBRONCHIAL ULTRASOUND;  Surgeon: Flora Lipps, MD;  Location: [facility] ORS;  Service: Cardiopulmonary;  Laterality: N/A;  . ESOPHAGOGASTRODUODENOSCOPY (EGD) WITH PROPOFOL N/A 08/02/2015   Procedure: ESOPHAGOGASTRODUODENOSCOPY (EGD) WITH PROPOFOL;  Surgeon: Manya Silvas, MD;  Location: [facility] ENDOSCOPY;  Service: Endoscopy;  Laterality: N/A;  . FLEXIBLE BRONCHOSCOPY N/A 03/26/2018   Procedure: FLEXIBLE BRONCHOSCOPY;  Surgeon: Flora Lipps, MD;  Location: [facility] ORS;  Service: Cardiopulmonary;  Laterality: N/A;  . KNEE ARTHROSCOPY Left   . LEFT HEART CATHETERIZATION WITH CORONARY ANGIOGRAM N/A 05/03/2014   Procedure: LEFT HEART CATHETERIZATION WITH CORONARY ANGIOGRAM;  Surgeon: Blane Ohara, MD;  Location: [facility] CATH LAB;  Service: Cardiovascular;  Laterality: N/A;  . PERCUTANEOUS CORONARY STENT INTERVENTION (PCI-S) N/A 05/05/2014   Procedure: PERCUTANEOUS CORONARY STENT INTERVENTION (PCI-S);  Surgeon: Peter M Martinique, MD;  Location: [facility] CATH LAB;  Service: Cardiovascular;  Laterality: N/A;    FAMILY HISTORY: family history includes Bone cancer in his paternal uncle; CAD (age of onset: 13) in his father; CAD (age of onset: 38) in his mother; COPD in his sister; Lung cancer in his brother.  SOCIAL HISTORY:  reports that he quit smoking about 7 years ago. His smoking use included  cigarettes. He has a 40.00 pack-year smoking  history. He has never used smokeless tobacco. He reports that he does not drink alcohol or use drugs.  ALLERGIES: Patient has no known allergies.  MEDICATIONS:  Current Outpatient Medications  Medication Sig Dispense Refill  . acetaminophen (TYLENOL) 500 MG tablet Take 1,000 mg by mouth every 6 (six) hours as needed (for pain.).    Marland Kitchen albuterol (PROAIR HFA) 108 (90 Base) MCG/ACT inhaler Inhale 2 puffs into the lungs every 6 (six) hours as needed for wheezing or shortness of breath.     Marland Kitchen atorvastatin (LIPITOR) 80 MG tablet Take 1 tablet (80 mg total) by mouth daily at 6 PM. 90 tablet 3  . carvedilol (COREG) 6.25 MG tablet Take 1 tablet (6.25 mg total) by mouth 2 (two) times daily. 180 tablet 3  . lidocaine-prilocaine (EMLA) cream Apply to affected area once 30 g 3  . lisinopril (PRINIVIL,ZESTRIL) 10 MG tablet Take 1 tablet (10 mg total) by mouth daily. 90 tablet 3  . nitroGLYCERIN (NITROSTAT) 0.4 MG SL tablet Place 1 tablet (0.4 mg total) under the tongue every 5 (five) minutes as needed for chest pain. X 3 doses 25 tablet 3  . omeprazole (PRILOSEC) 40 MG capsule Take 40 mg by mouth 2 (two) times daily.     . ondansetron (ZOFRAN) 8 MG tablet Take 1 tablet (8 mg total) by mouth 2 (two) times daily as needed for refractory nausea / vomiting. Start on day 3 after chemo. 30 tablet 1  . potassium chloride SA (KLOR-CON M20) 20 MEQ tablet Take 1 tablet (20 mEq total) by mouth daily. 90 tablet 1  . prochlorperazine (COMPAZINE) 10 MG tablet Take 1 tablet (10 mg total) by mouth every 6 (six) hours as needed (Nausea or vomiting). 30 tablet 1  . ranitidine (ZANTAC) 150 MG tablet Take 150 mg by mouth 2 (two) times daily.    . tamsulosin (FLOMAX) 0.4 MG CAPS capsule Take 0.4 mg by mouth daily after supper.    . umeclidinium-vilanterol (ANORO ELLIPTA) 62.5-25 MCG/INH AEPB Inhale 1 puff into the lungs every morning.    . colestipol (COLESTID) 1 g tablet Take 2 g by mouth daily as needed (stomach issues).       No current facility-administered medications for this encounter.     ECOG PERFORMANCE STATUS:  0 - Asymptomatic  REVIEW OF SYSTEMS:  Patient denies any weight loss, fatigue, weakness, fever, chills or night sweats. Patient denies any loss of vision, blurred vision. Patient denies any ringing  of the ears or hearing loss. No irregular heartbeat. Patient denies heart murmur or history of fainting. Patient denies any chest pain or pain radiating to her upper extremities. Patient denies any shortness of breath, difficulty breathing at night, cough or hemoptysis. Patient denies any swelling in the lower legs. Patient denies any nausea vomiting, vomiting of blood, or coffee ground material in the vomitus. Patient denies any stomach pain. Patient states has had normal bowel movements no significant constipation or diarrhea. Patient denies any dysuria, hematuria or significant nocturia. Patient denies any problems walking, swelling in the joints or loss of balance. Patient denies any skin changes, loss of hair or loss of weight. Patient denies any excessive worrying or anxiety or significant depression. Patient denies any problems with insomnia. Patient denies excessive thirst, polyuria, polydipsia. Patient denies any swollen glands, patient denies easy bruising or easy bleeding. Patient denies any recent infections, allergies or URI. Patient "s visual fields have not changed significantly in recent  time.    PHYSICAL EXAM: BP (!) 157/92 (BP Location: Right Arm, Patient Position: Sitting)   Pulse 73   Temp (!) 97.3 F (36.3 C) (Tympanic)   Resp 18   Wt 160 lb 4.4 oz (72.7 kg)   BMI 24.37 kg/m  Well-developed well-nourished patient in NAD. HEENT reveals PERLA, EOMI, discs not visualized.  Oral cavity is clear. No oral mucosal lesions are identified. Neck is clear without evidence of cervical or supraclavicular adenopathy. Lungs are clear to A&P. Cardiac examination is essentially unremarkable with  regular rate and rhythm without murmur rub or thrill. Abdomen is benign with no organomegaly or masses noted. Motor sensory and DTR levels are equal and symmetric in the upper and lower extremities. Cranial nerves II through XII are grossly intact. Proprioception is intact. No peripheral adenopathy or edema is identified. No motor or sensory levels are noted. Crude visual fields are within normal range.  LABORATORY DATA: pathology reports reviewed    RADIOLOGY RESULTS:CT scans PET/CT scanand MRI scan of brain reviewed   IMPRESSION: stage IIIa (T2 N2 M0) poorly differentiated adenocarcinoma the right lower lobe in 62 year old male  PLAN: at this time I agree with concurrent chemoradiation therapy with curative intent. I would plan on delivering 7000 cGy to his right lower lobe lesion. I believe I can dose painting his subcarinal node 5000 cGy both using I M RT treatment planning and delivery to spare normal lung volume esophagus spinal cord. Risks and benefits of treatment including increased cough fatigue alteration of blood counts normal lung volume loss possible radiation esophagitis based on the close proximity of the node to the esophagus all were discussed in detail with the patient and his wife.There will be extra effort by both professional staff as well as technical staff to coordinate and manage concurrent chemoradiation and ensuing side effects during his treatments.  I have personally set up and ordered CT simulation for early next week. He is scheduled to have a port placed.patient wife both comprehend my treatment plan well.  I would like to take this opportunity to thank you for allowing me to participate in the care of your patient.Noreene Filbert, MD

## 2018-04-03 NOTE — Telephone Encounter (Signed)
   Primary Cardiologist: Sherren Mocha, MD  Chart reviewed as part of pre-operative protocol coverage. Patient was contacted 04/03/2018 in reference to pre-operative risk assessment for pending surgery as outlined below.  Timothy Yoder was last seen on 06/18/17 by Dr. Burt Knack.  Since that day, Timothy Yoder has done well with no new cardiac issues. He works part time and has been active with no exertional symptoms.  Therefore, based on ACC/AHA guidelines, the patient would be at acceptable risk for the planned procedure without further cardiovascular testing.   I will route this recommendation to the requesting party via Epic fax function and remove from pre-op pool.  Please call with questions.  Daune Perch, NP 04/03/2018, 4:33 PM

## 2018-04-03 NOTE — Telephone Encounter (Signed)
Message left that we have received cardiac clearance.   A copy of clearance has been forwarded to Same Day Surgery.   Clearance note is also in Epic.

## 2018-04-04 ENCOUNTER — Ambulatory Visit
Admission: RE | Admit: 2018-04-04 | Discharge: 2018-04-04 | Disposition: A | Discharge status: Home / Self Care | Payer: BLUE CROSS/BLUE SHIELD | Source: Home / Self Care | Attending: Cardiothoracic Surgery | Admitting: Cardiothoracic Surgery

## 2018-04-04 ENCOUNTER — Encounter: Payer: Self-pay | Admitting: *Deleted

## 2018-04-04 ENCOUNTER — Ambulatory Visit: Payer: BLUE CROSS/BLUE SHIELD

## 2018-04-04 ENCOUNTER — Ambulatory Visit: Payer: BLUE CROSS/BLUE SHIELD | Admitting: Family

## 2018-04-04 ENCOUNTER — Other Ambulatory Visit: Payer: Self-pay

## 2018-04-04 ENCOUNTER — Ambulatory Visit
Admission: RE | Admit: 2018-04-04 | Discharge: 2018-04-04 | Disposition: A | Discharge status: Home / Self Care | Payer: BLUE CROSS/BLUE SHIELD | Attending: Cardiothoracic Surgery | Admitting: Cardiothoracic Surgery

## 2018-04-04 ENCOUNTER — Encounter
Admission: RE | Disposition: A | Discharge status: Home / Self Care | Payer: Self-pay | Source: Home / Self Care | Attending: Cardiothoracic Surgery

## 2018-04-04 DIAGNOSIS — I252 Old myocardial infarction: Secondary | ICD-10-CM | POA: Diagnosis not present

## 2018-04-04 DIAGNOSIS — I10 Essential (primary) hypertension: Secondary | ICD-10-CM | POA: Insufficient documentation

## 2018-04-04 DIAGNOSIS — Z79899 Other long term (current) drug therapy: Secondary | ICD-10-CM | POA: Insufficient documentation

## 2018-04-04 DIAGNOSIS — R590 Localized enlarged lymph nodes: Secondary | ICD-10-CM | POA: Diagnosis not present

## 2018-04-04 DIAGNOSIS — K219 Gastro-esophageal reflux disease without esophagitis: Secondary | ICD-10-CM | POA: Insufficient documentation

## 2018-04-04 DIAGNOSIS — N400 Enlarged prostate without lower urinary tract symptoms: Secondary | ICD-10-CM | POA: Diagnosis not present

## 2018-04-04 DIAGNOSIS — I2510 Atherosclerotic heart disease of native coronary artery without angina pectoris: Secondary | ICD-10-CM | POA: Diagnosis not present

## 2018-04-04 DIAGNOSIS — Z87891 Personal history of nicotine dependence: Secondary | ICD-10-CM | POA: Diagnosis not present

## 2018-04-04 DIAGNOSIS — J449 Chronic obstructive pulmonary disease, unspecified: Secondary | ICD-10-CM | POA: Insufficient documentation

## 2018-04-04 DIAGNOSIS — R918 Other nonspecific abnormal finding of lung field: Secondary | ICD-10-CM

## 2018-04-04 DIAGNOSIS — C3490 Malignant neoplasm of unspecified part of unspecified bronchus or lung: Secondary | ICD-10-CM | POA: Insufficient documentation

## 2018-04-04 HISTORY — PX: PORTACATH PLACEMENT: SHX2246

## 2018-04-04 SURGERY — INSERTION, TUNNELED CENTRAL VENOUS DEVICE, WITH PORT
Anesthesia: General | Site: Chest | Laterality: Left

## 2018-04-04 MED ORDER — MIDAZOLAM HCL 2 MG/2ML IJ SOLN
INTRAMUSCULAR | Status: DC | PRN
  Administered 2018-04-04: 2 mg via INTRAVENOUS

## 2018-04-04 MED ORDER — OXYCODONE-ACETAMINOPHEN 5-325 MG PO TABS: 1.0000 | ORAL_TABLET | Freq: Four times a day (QID) | ORAL | 0 refills | Status: DC | PRN

## 2018-04-04 MED ORDER — OXYCODONE-ACETAMINOPHEN 5-325 MG PO TABS: 1.0000 | ORAL_TABLET | Freq: Once | ORAL | Status: DC

## 2018-04-04 MED ORDER — OXYCODONE-ACETAMINOPHEN 5-325 MG PO TABS
ORAL_TABLET | ORAL | Status: AC
  Administered 2018-04-04: 1
  Filled 2018-04-04: qty 1

## 2018-04-04 MED ORDER — PROPOFOL 10 MG/ML IV BOLUS
INTRAVENOUS | Status: DC | PRN
  Administered 2018-04-04: 150 mg via INTRAVENOUS

## 2018-04-04 MED ORDER — MIDAZOLAM HCL 2 MG/2ML IJ SOLN
INTRAMUSCULAR | Status: AC
  Filled 2018-04-04: qty 2

## 2018-04-04 MED ORDER — HEPARIN SODIUM (PORCINE) 5000 UNIT/ML IJ SOLN
INTRAMUSCULAR | Status: AC
  Filled 2018-04-04: qty 1

## 2018-04-04 MED ORDER — CEFAZOLIN SODIUM-DEXTROSE 1-4 GM/50ML-% IV SOLN
INTRAVENOUS | Status: AC
  Filled 2018-04-04: qty 50

## 2018-04-04 MED ORDER — CEFAZOLIN SODIUM-DEXTROSE 2-4 GM/100ML-% IV SOLN
INTRAVENOUS | Status: AC
  Filled 2018-04-04: qty 100

## 2018-04-04 MED ORDER — SODIUM CHLORIDE 0.9 % IV SOLN
INTRAVENOUS | Status: DC | PRN
  Administered 2018-04-04: 30 mL

## 2018-04-04 MED ORDER — FENTANYL CITRATE (PF) 100 MCG/2ML IJ SOLN
INTRAMUSCULAR | Status: DC | PRN
  Administered 2018-04-04 (×2): 50 ug via INTRAVENOUS

## 2018-04-04 MED ORDER — LACTATED RINGERS IV SOLN
INTRAVENOUS | Status: DC
  Administered 2018-04-04: 08:00:00 via INTRAVENOUS

## 2018-04-04 MED ORDER — CHLORHEXIDINE GLUCONATE CLOTH 2 % EX PADS: 6.0000 | MEDICATED_PAD | Freq: Once | CUTANEOUS | Status: DC

## 2018-04-04 MED ORDER — FENTANYL CITRATE (PF) 100 MCG/2ML IJ SOLN
INTRAMUSCULAR | Status: AC
  Filled 2018-04-04: qty 2

## 2018-04-04 MED ORDER — ONDANSETRON HCL 4 MG/2ML IJ SOLN
INTRAMUSCULAR | Status: AC
  Filled 2018-04-04: qty 2

## 2018-04-04 MED ORDER — CEFAZOLIN SODIUM-DEXTROSE 2-4 GM/100ML-% IV SOLN
2.0000 g | INTRAVENOUS | Status: AC
  Administered 2018-04-04: 2 g via INTRAVENOUS

## 2018-04-04 MED ORDER — LIDOCAINE HCL 1 % IJ SOLN
INTRAMUSCULAR | Status: DC | PRN
  Administered 2018-04-04: 14 mL

## 2018-04-04 MED ORDER — ONDANSETRON HCL 4 MG/2ML IJ SOLN: 4.0000 mg | Freq: Once | INTRAMUSCULAR | Status: DC | PRN

## 2018-04-04 MED ORDER — LIDOCAINE HCL (PF) 1 % IJ SOLN
INTRAMUSCULAR | Status: AC
  Filled 2018-04-04: qty 30

## 2018-04-04 MED ORDER — FENTANYL CITRATE (PF) 100 MCG/2ML IJ SOLN: 25.0000 ug | INTRAMUSCULAR | Status: DC | PRN

## 2018-04-04 MED ORDER — ONDANSETRON HCL 4 MG/2ML IJ SOLN
INTRAMUSCULAR | Status: DC | PRN
  Administered 2018-04-04: 4 mg via INTRAVENOUS

## 2018-04-04 SURGICAL SUPPLY — 41 items
BAG DECANTER FOR FLEXI CONT (MISCELLANEOUS) ×2 IMPLANT
BLADE SURG SZ11 CARB STEEL (BLADE) ×2 IMPLANT
CANISTER SUCT 1200ML W/VALVE (MISCELLANEOUS) ×2 IMPLANT
CHLORAPREP W/TINT 26ML (MISCELLANEOUS) ×2 IMPLANT
COVER LIGHT HANDLE STERIS (MISCELLANEOUS) ×4 IMPLANT
DRAPE C-ARM XRAY 36X54 (DRAPES) ×2 IMPLANT
DRSG TEGADERM 2-3/8X2-3/4 SM (GAUZE/BANDAGES/DRESSINGS) ×2 IMPLANT
DRSG TEGADERM 4X4.75 (GAUZE/BANDAGES/DRESSINGS) ×2 IMPLANT
DRSG TELFA 4X3 1S NADH ST (GAUZE/BANDAGES/DRESSINGS) ×2 IMPLANT
ELECT CAUTERY BLADE TIP 2.5 (TIP) ×2
ELECT REM PT RETURN 9FT ADLT (ELECTROSURGICAL) ×2
ELECTRODE CAUTERY BLDE TIP 2.5 (TIP) ×1 IMPLANT
ELECTRODE REM PT RTRN 9FT ADLT (ELECTROSURGICAL) ×1 IMPLANT
GLOVE SURG SYN 7.5  E (GLOVE) ×1
GLOVE SURG SYN 7.5 E (GLOVE) ×1 IMPLANT
GOWN STRL REUS W/ TWL LRG LVL3 (GOWN DISPOSABLE) ×2 IMPLANT
GOWN STRL REUS W/TWL LRG LVL3 (GOWN DISPOSABLE) ×2
IV NS 500ML (IV SOLUTION) ×1
IV NS 500ML BAXH (IV SOLUTION) ×1 IMPLANT
KIT PORT POWER 8FR ISP CVUE (Port) ×2 IMPLANT
KIT TURNOVER KIT A (KITS) ×2 IMPLANT
LABEL OR SOLS (LABEL) ×2 IMPLANT
MARKER SKIN DUAL TIP RULER LAB (MISCELLANEOUS) ×2 IMPLANT
NEEDLE FILTER BLUNT 18X 1/2SAF (NEEDLE) ×1
NEEDLE FILTER BLUNT 18X1 1/2 (NEEDLE) ×1 IMPLANT
NEEDLE HYPO 22GX1.5 SAFETY (NEEDLE) ×4 IMPLANT
NS IRRIG 500ML POUR BTL (IV SOLUTION) ×2 IMPLANT
PACK PORT-A-CATH (MISCELLANEOUS) ×2 IMPLANT
STRIP CLOSURE SKIN 1/4X4 (GAUZE/BANDAGES/DRESSINGS) IMPLANT
SUT ETHILON 4-0 (SUTURE) ×2
SUT ETHILON 4-0 FS2 18XMFL BLK (SUTURE) ×2
SUT PROLENE 2 0 SH DA (SUTURE) ×4 IMPLANT
SUT VIC AB 2-0 SH 27 (SUTURE) ×1
SUT VIC AB 2-0 SH 27XBRD (SUTURE) ×1 IMPLANT
SUT VIC AB 3-0 SH 27 (SUTURE) ×1
SUT VIC AB 3-0 SH 27X BRD (SUTURE) ×1 IMPLANT
SUTURE ETHLN 4-0 FS2 18XMF BLK (SUTURE) ×2 IMPLANT
SYR 10ML SLIP (SYRINGE) ×2 IMPLANT
SYR 3ML LL SCALE MARK (SYRINGE) ×2 IMPLANT
TAPE CLOTH 3X10 WHT NS LF (GAUZE/BANDAGES/DRESSINGS) IMPLANT
TAPE TRANSPORE STRL 2 31045 (GAUZE/BANDAGES/DRESSINGS) ×2 IMPLANT

## 2018-04-04 NOTE — Anesthesia Preprocedure Evaluation (Signed)
Anesthesia Evaluation  Patient identified by MRN, date of birth, ID band Patient awake    Reviewed: Allergy & Precautions, H&P , NPO status , Patient's Chart, lab work & pertinent test results, reviewed documented beta blocker date and time   Airway Mallampati: II  TM Distance: >3 FB Neck ROM: full    Dental  (+) Poor Dentition   Pulmonary neg pulmonary ROS, COPD, former smoker,    Pulmonary exam normal        Cardiovascular Exercise Tolerance: Good hypertension, On Medications + CAD and + Past MI  negative cardio ROS Normal cardiovascular exam Rhythm:regular Rate:Normal     Neuro/Psych negative neurological ROS  negative psych ROS   GI/Hepatic negative GI ROS, Neg liver ROS, GERD  Medicated,  Endo/Other  negative endocrine ROS  Renal/GU negative Renal ROS  negative genitourinary   Musculoskeletal   Abdominal   Peds  Hematology negative hematology ROS (+)   Anesthesia Other Findings Past Medical History: No date: BPH (benign prostatic hyperplasia) No date: CAD (coronary artery disease)     Comment:  a. 04/2014 anterolateral STEMI/PCI: LM nl, LAD 3m              (2.75x16 Promus DES, D1 50p, LCX nl, RI nl, OM1 100               small/chronic, RCA dominant, [id number](FFR 0.90->Med              Rx), RPDA 50, RPLA 70, EF 60%. No date: COPD (chronic obstructive pulmonary disease) ([facility])     Comment:  Dr FleRaul Del date: Former tobacco use No date: Gastroesophageal reflux disease No date: HTN (hypertension) No date: Hyperlipidemia No date: Hypokalemia 04/01/2018: [place] [facility]: [facility]   Comment:  Dr MicSherren Mocha date: Stented coronary artery Past Surgical History: No date: CARDIAC CATHETERIZATION 02/15/2016: CHOLECYSTECTOMY; N/A     Comment:  Procedure: LAPAROSCOPIC CHOLECYSTECTOMY WITH               INTRAOPERATIVE CHOLANGIOGRAM;  Surgeon: JarLeonie GreenD;  Location: [facility] ORS;  Service: General;                Laterality: N/A; No date: COLONOSCOPY 08/02/2015: COLONOSCOPY WITH PROPOFOL; N/A     Comment:  Procedure: COLONOSCOPY WITH PROPOFOL;  Surgeon: RobManya SilvasD;  [facility];  Laterality: N/A; 04/2014: CORONARY ANGIOPLASTY     Comment:  STENT 03/26/2018: ENDOBRONCHIAL ULTRASOUND; N/A     Comment:  Procedure: ENDOBRONCHIAL ULTRASOUND;  Surgeon: KasFlora LippsD;  Location: [facility] ORS;  Service:               Cardiopulmonary;  Laterality: N/A; 08/02/2015: ESOPHAGOGASTRODUODENOSCOPY (EGD) WITH PROPOFOL; N/A     Comment:  Procedure: ESOPHAGOGASTRODUODENOSCOPY (EGD) WITH               PROPOFOL;  Surgeon: RobManya SilvasD;  [facility];  Service: Endoscopy;  Laterality: N/A; 03/26/2018: FLEXIBLE BRONCHOSCOPY; N/A     Comment:  Procedure: FLEXIBLE BRONCHOSCOPY;  Surgeon: Flora Lipps, MD;  Location: [facility] ORS;  Service:               Cardiopulmonary;  Laterality: N/A; No date: KNEE ARTHROSCOPY; Left 05/03/2014: LEFT HEART CATHETERIZATION WITH CORONARY ANGIOGRAM; N/A     Comment:  Procedure: LEFT HEART CATHETERIZATION WITH CORONARY               ANGIOGRAM;  Surgeon: Blane Ohara, MD;  Location: [facility]               CATH LAB;  Service: Cardiovascular;  Laterality: N/A; 05/05/2014: PERCUTANEOUS CORONARY STENT INTERVENTION (PCI-S); N/A     Comment:  Procedure: PERCUTANEOUS CORONARY STENT INTERVENTION               (PCI-S);  Surgeon: Peter M Martinique, MD;  Location: [facility] CATH              LAB;  Service: Cardiovascular;  Laterality: N/A;   Reproductive/Obstetrics negative OB ROS                             Anesthesia Physical Anesthesia Plan  ASA: III  Anesthesia Plan: General   Post-op Pain Management:    Induction:   PONV Risk Score and Plan:   Airway Management  Planned:   Additional Equipment:   Intra-op Plan:   Post-operative Plan:   Informed Consent: I have reviewed the patients History and Physical, chart, labs and discussed the procedure including the risks, benefits and alternatives for the proposed anesthesia with the patient or authorized representative who has indicated his/her understanding and acceptance.   Dental Advisory Given  Plan Discussed with: CRNA  Anesthesia Plan Comments:         Anesthesia Quick Evaluation

## 2018-04-04 NOTE — Op Note (Signed)
04/04/2018  9:42 AM  PATIENT:  Timothy Yoder  62 y.o. male  PRE-OPERATIVE DIAGNOSIS:  lung cancer  POST-OPERATIVE DIAGNOSIS:  lung cancer  PROCEDURE:  Procedure(s): INSERTION PORT-A-CATH (Left)  SURGEON:  Surgeon(s) and Role:    * [name], MD - Primary  ASSISTANTS:    ANESTHESIA:     DICTATION:   The patient was brought to the operating suite and placed in the supine position. LMA anesthesia was induced. The patient was then prepped and draped in usual sterile fashion. The left subclavian was percutaneously catheterized. A wire was placed into the venous system under fluoroscopic guidance. An appropriate site was selected on the chest wall and a Port-A-Cath pocket was created. The catheter was tunneled from the port site up to the insertion site. The catheter was then inserted through a peel-away sheath and positioned at the appropriate level in the superior vena cava. The catheter was then assembled and aspirated and flushed easily. It was then secured to the anterior chest wall with interrupted Prolene sutures. The catheter was flushed one last time and the wounds were then closed. The subcutaneous tissues were closed with running absorbable sutures and the skin with nylon. Sterile dressings were applied. Patient was then transported to the recovery room in stable condition.   [name], MD

## 2018-04-04 NOTE — Anesthesia Postprocedure Evaluation (Signed)
Anesthesia Post Note  Patient: Timothy Yoder  Procedure(s) Performed: INSERTION PORT-A-CATH (Left Chest)  Patient location during evaluation: PACU Anesthesia Type: General Level of consciousness: awake and alert Pain management: pain level controlled Vital Signs Assessment: post-procedure vital signs reviewed and stable Respiratory status: spontaneous breathing, nonlabored ventilation, respiratory function stable and patient connected to nasal cannula oxygen Cardiovascular status: blood pressure returned to baseline and stable Postop Assessment: no apparent nausea or vomiting Anesthetic complications: no     Last Vitals:  Vitals:   04/04/18 0938 04/04/18 0948  BP: (!) 168/95   Pulse: 99   Resp:    Temp: (!) 35.6 C   SpO2: 94% 99%    Last Pain:  Vitals:   04/04/18 0948  TempSrc:   PainSc: 0-No pain                 Molli Barrows

## 2018-04-04 NOTE — Transfer of Care (Signed)
Immediate Anesthesia Transfer of Care Note  Patient: Timothy Yoder  Procedure(s) Performed: INSERTION PORT-A-CATH (Left Chest)  Patient Location: PACU  Anesthesia Type:General  Level of Consciousness: awake and drowsy  Airway & Oxygen Therapy: Patient Spontanous Breathing and Patient connected to face mask oxygen  Post-op Assessment: Report given to RN and Post -op Vital signs reviewed and stable  Post vital signs: Reviewed and stable  Last Vitals:  Vitals Value Taken Time  BP 168/95 04/04/2018  9:38 AM  Temp 35.6 C 04/04/2018  9:38 AM  Pulse 91 04/04/2018  9:40 AM  Resp    SpO2 100 % 04/04/2018  9:40 AM  Vitals shown include unvalidated device data.  Last Pain:  Vitals:   04/04/18 0701  TempSrc: Temporal  PainSc: 0-No pain         Complications: No apparent anesthesia complications

## 2018-04-04 NOTE — Anesthesia Post-op Follow-up Note (Signed)
Anesthesia QCDR form completed.        

## 2018-04-04 NOTE — Interval H&P Note (Signed)
History and Physical Interval Note:  04/04/2018 7:20 AM  Timothy Yoder  has presented today for surgery, with the diagnosis of lung cancer  The various methods of treatment have been discussed with the patient and family. After consideration of risks, benefits and other options for treatment, the patient has consented to  Procedure(s): INSERTION PORT-A-CATH (N/A) as a surgical intervention .  The patient's history has been reviewed, patient examined, no change in status, stable for surgery.  I have reviewed the patient's chart and labs.  Questions were answered to the patient's satisfaction.     [name]

## 2018-04-05 ENCOUNTER — Ambulatory Visit (INDEPENDENT_AMBULATORY_CARE_PROVIDER_SITE_OTHER): Payer: BLUE CROSS/BLUE SHIELD | Admitting: Internal Medicine

## 2018-04-05 ENCOUNTER — Encounter: Payer: Self-pay | Admitting: Internal Medicine

## 2018-04-05 VITALS — BP 144/84 | HR 74 | Resp 16 | Ht 68.0 in | Wt 163.0 lb

## 2018-04-05 DIAGNOSIS — C3490 Malignant neoplasm of unspecified part of unspecified bronchus or lung: Secondary | ICD-10-CM

## 2018-04-05 DIAGNOSIS — J441 Chronic obstructive pulmonary disease with (acute) exacerbation: Secondary | ICD-10-CM

## 2018-04-05 DIAGNOSIS — J449 Chronic obstructive pulmonary disease, unspecified: Secondary | ICD-10-CM

## 2018-04-05 MED ORDER — AZITHROMYCIN 250 MG PO TABS: ORAL_TABLET | ORAL | 0 refills | Status: DC

## 2018-04-05 MED ORDER — PREDNISONE 20 MG PO TABS: 20.0000 mg | ORAL_TABLET | Freq: Every day | ORAL | 0 refills | Status: DC

## 2018-04-05 NOTE — Progress Notes (Signed)
Name: Timothy Yoder MRN: [id number] DOB: 03-May-1955     CONSULTATION DATE: 03/19/2018 REFERRING MD : Dr. Genevive Bi  CHIEF COMPLAINT: COPD and Abnormal CT chest  STUDIES:     CXR independently reviewed by Me 03/11/2018 CT chest Independently reviewed by Me today Right lower lobe lung mass, subcarinal adenopathy Findings are highly suggestive of primary lung cancer  Patient has a diagnosis of COPD He sees Dr. Raul Del Previous PFT shows a ratio of 53% predicted with 62% predicted FEV1 Findings suggest moderate COPD   Patient sees cardiology has a history of MI in 2016 Status post cardiac stents on aspirin therapy Patient needs to see cardiology prior to procedure  HISTORY OF PRESENT ILLNESS: Follow up ENb/EBUS Dx with Adenocarcinoma Patient had port placed yesterday Follow with radiation oncology pending   Patient has a 40-pack-year smoking history Quit 7 years ago  Patient has COPD Moderate with FEV1 of 53% predicted  At this time his COPD seems to be under control He takes [facility] daily Uses albuterol infrequently  No signs of exacerbation at this time  no signs of infection   Patient takes low-dose aspirin for stents related to myocardial infarction 3 years ago He denies any fevers chills hemoptysis weight loss at this time Appetite is good       Prior to Admission medications   Medication Sig Start Date End Date Taking? Authorizing Provider  albuterol (PROAIR HFA) 108 (90 Base) MCG/ACT inhaler Inhale 2 puffs into the lungs every 6 (six) hours as needed for wheezing or shortness of breath.  06/04/15   [provider]  aspirin 81 MG chewable tablet Chew 1 tablet (81 mg total) by mouth daily. 05/06/14   Theora Gianotti, NP  atorvastatin (LIPITOR) 80 MG tablet Take 1 tablet (80 mg total) by mouth daily at 6 PM. 06/18/17   Sherren Mocha, MD  carvedilol (COREG) 6.25 MG tablet Take 1 tablet (6.25 mg total) by mouth 2 (two) times daily. 06/18/17  06/13/18  Sherren Mocha, MD  colestipol (COLESTID) 1 g tablet Take 2 g by mouth daily as needed. Stomach issues 03/27/17 03/27/18  [provider]  lisinopril (PRINIVIL,ZESTRIL) 10 MG tablet Take 1 tablet (10 mg total) by mouth daily. 06/18/17   Sherren Mocha, MD  nitroGLYCERIN (NITROSTAT) 0.4 MG SL tablet Place 1 tablet (0.4 mg total) under the tongue every 5 (five) minutes as needed for chest pain. X 3 doses 06/18/17   Sherren Mocha, MD  omeprazole (PRILOSEC) 40 MG capsule Take 40 mg by mouth 2 (two) times daily.     [provider]  potassium chloride SA (KLOR-CON M20) 20 MEQ tablet Take 1 tablet (20 mEq total) by mouth daily. 11/26/17   Sherren Mocha, MD  ranitidine (ZANTAC) 150 MG tablet Take 150 mg by mouth 2 (two) times daily.    [provider]  tamsulosin (FLOMAX) 0.4 MG CAPS capsule Take 0.4 mg by mouth daily after supper.    [provider]  umeclidinium-vilanterol (ANORO ELLIPTA) 62.5-25 MCG/INH AEPB Inhale 1 puff into the lungs every morning.    [provider]   No Known Allergies    Review of Systems:  Gen:  Denies  fever, sweats, chills weigh loss  HEENT: Denies blurred vision, double vision, ear pain, eye pain, hearing loss, nose bleeds, sore throat Cardiac:  No dizziness, chest pain or heaviness, chest tightness,edema, No JVD Resp:   No cough, -sputum production, -shortness of breath,-wheezing, -hemoptysis,  Gi: Denies swallowing difficulty, stomach pain,  nausea or vomiting, diarrhea, constipation, bowel incontinence Gu:  Denies bladder incontinence, burning urine Ext:   Denies Joint pain, stiffness or swelling Skin: Denies  skin rash, easy bruising or bleeding or hives Endoc:  Denies polyuria, polydipsia , polyphagia or weight change Psych:   Denies depression, insomnia or hallucinations  Other:  All other systems negative   Ht 5\' 8"  (1.727 m)   BMI 24.37 kg/m   BP (!) 144/84 (BP Location: Left Arm, Cuff Size: Normal)    Pulse 74   Resp 16   Ht 5\' 8"  (1.727 m)   Wt 163 lb (73.9 kg)   SpO2 95%   BMI 24.78 kg/m    Physical Examination:   GENERAL:NAD, no fevers, chills, no weakness no fatigue HEAD: Normocephalic, atraumatic.  EYES: Pupils equal, round, reactive to light. Extraocular muscles intact. No scleral icterus.  MOUTH: Moist mucosal membrane. Dentition intact. No abscess noted.  EAR, NOSE, THROAT: Clear without exudates. No external lesions.  NECK: Supple. No thyromegaly. No nodules. No JVD.  PULMONARY: CTA B/L no wheezing, rhonchi, crackles CARDIOVASCULAR: S1 and S2. Regular rate and rhythm. No murmurs, rubs, or gallops. No edema. Pedal pulses 2+ bilaterally.  GASTROINTESTINAL: Soft, nontender, nondistended. No masses. Positive bowel sounds. No hepatosplenomegaly.  MUSCULOSKELETAL: Left port placement  Bandages in place mild pain no swelling,  NEUROLOGIC: Cranial nerves II through XII are intact. No gross focal neurological deficits. Sensation intact. Reflexes intact.  SKIN: No ulceration, lesions, rashes, or cyanosis. Skin warm and dry. Turgor intact.  PSYCHIATRIC: Mood, affect within normal limits. The patient is awake, alert and oriented x 3. Insight, judgment intact.  ALL OTHER ROS ARE NEGATIVE       03/11/2018 CT chest lung screen 1. New 2.4 cm right lower lobe nodule with a new necrotic appearing subcarinal lymph node. Lung-RADS 4B, suspicious. Additional imaging evaluation or consultation with Pulmonology or Thoracic Surgery recommended. These results will be called to the ordering clinician or representative by the Radiologist Assistant, and communication documented in the PACS or zVision Dashboard. 2. Aortic atherosclerosis (ICD10-170.0). Coronary arterycalcification.3. Emphysema (ICD10-J43.9). 03/13/2018 PET scan # Hypermetabolic right lower lobe pulmonary nodule, consistent with primary bronchogenic carcinoma. Fluid density subcarinal structure demonstrates no significant  hypermetabolism. Although this could represent an incidental benign lesion such as a bronchogenic cyst, given interval development since 02/24/2017, necrotic adenopathy cannot be excluded and tissue sampling should be considered.   ASSESSMENT / PLAN: 62 year old pleasant white male seen today for follow-up abnormal CT chest status post endobronchial ultrasound of the subcarinal mass which shows adenocarcinoma poorly differentiated in the setting of moderate COPD which seems to be well-controlled at this time   #1 adenocarcinoma Follow-up with oncology Follow-up with radiation oncology  #2 moderate COPD Gold stage B Continue Anoro as prescribed(anticholinergic and long-acting beta agonist) Albuterol as needed Patient is exposed to sick contact at this time I have prescribed Z-Pak and prednisone just in case he needs it Instructions provided to patient   Patient satisfied with Plan of action and management. All questions answered Follow-up in 6 months    Drake Wuertz Patricia Pesa, M.D.  [facility]  Medical Director [place] Director [facility] Cardio-Pulmonary Department

## 2018-04-05 NOTE — Patient Instructions (Signed)
Continue Inhalers as prescribed  Follow up wit Oncology as scheduled  Z pak and Prednisone if needed for exacerbation

## 2018-04-08 ENCOUNTER — Ambulatory Visit
Admission: RE | Admit: 2018-04-08 | Discharge: 2018-04-08 | Disposition: A | Discharge status: Home / Self Care | Payer: BLUE CROSS/BLUE SHIELD | Source: Ambulatory Visit | Attending: Radiation Oncology | Admitting: Radiation Oncology

## 2018-04-08 DIAGNOSIS — C3431 Malignant neoplasm of lower lobe, right bronchus or lung: Secondary | ICD-10-CM | POA: Insufficient documentation

## 2018-04-10 DIAGNOSIS — C3431 Malignant neoplasm of lower lobe, right bronchus or lung: Secondary | ICD-10-CM | POA: Diagnosis not present

## 2018-04-15 ENCOUNTER — Ambulatory Visit (INDEPENDENT_AMBULATORY_CARE_PROVIDER_SITE_OTHER): Payer: BLUE CROSS/BLUE SHIELD | Admitting: Cardiothoracic Surgery

## 2018-04-15 ENCOUNTER — Other Ambulatory Visit: Payer: Self-pay

## 2018-04-15 ENCOUNTER — Encounter: Payer: Self-pay | Admitting: Cardiothoracic Surgery

## 2018-04-15 VITALS — BP 176/106 | HR 97 | Temp 98.1°F | Resp 16 | Ht 68.0 in | Wt 163.6 lb

## 2018-04-15 DIAGNOSIS — R918 Other nonspecific abnormal finding of lung field: Secondary | ICD-10-CM | POA: Diagnosis not present

## 2018-04-15 NOTE — Patient Instructions (Signed)
Please call our office if you have questions or concerns.   

## 2018-04-15 NOTE — Progress Notes (Signed)
He returns today in follow-up.  He is now 11 days out from his Port-A-Cath insertion.  He states he has had no problems.  He begins his chemo in early January and his radiation next week.  He did take a Z-Pak since his wife was ill and his family physician prescribed one for her.  Otherwise he seems to be getting along pretty well.  His Port-A-Cath site is well-healed.  There is no erythema or drainage.  I removed all the sutures.  We Steri-Stripped his wound.  We will see him back as needed.

## 2018-04-18 ENCOUNTER — Ambulatory Visit
Admission: RE | Admit: 2018-04-18 | Discharge: 2018-04-18 | Disposition: A | Discharge status: Home / Self Care | Payer: BLUE CROSS/BLUE SHIELD | Source: Ambulatory Visit | Attending: Radiation Oncology | Admitting: Radiation Oncology

## 2018-04-22 ENCOUNTER — Ambulatory Visit
Admission: RE | Admit: 2018-04-22 | Discharge: 2018-04-22 | Disposition: A | Discharge status: Home / Self Care | Payer: BLUE CROSS/BLUE SHIELD | Source: Ambulatory Visit | Attending: Radiation Oncology | Admitting: Radiation Oncology

## 2018-04-22 DIAGNOSIS — C3431 Malignant neoplasm of lower lobe, right bronchus or lung: Secondary | ICD-10-CM | POA: Diagnosis not present

## 2018-04-23 ENCOUNTER — Ambulatory Visit
Admission: RE | Admit: 2018-04-23 | Discharge: 2018-04-23 | Disposition: A | Discharge status: Home / Self Care | Payer: BLUE CROSS/BLUE SHIELD | Source: Ambulatory Visit | Attending: Radiation Oncology | Admitting: Radiation Oncology

## 2018-04-23 DIAGNOSIS — C3431 Malignant neoplasm of lower lobe, right bronchus or lung: Secondary | ICD-10-CM | POA: Diagnosis not present

## 2018-04-25 ENCOUNTER — Ambulatory Visit
Admission: RE | Admit: 2018-04-25 | Discharge: 2018-04-25 | Disposition: A | Discharge status: Home / Self Care | Payer: BLUE CROSS/BLUE SHIELD | Source: Ambulatory Visit | Attending: Radiation Oncology | Admitting: Radiation Oncology

## 2018-04-25 DIAGNOSIS — Z510 Encounter for antineoplastic radiation therapy: Secondary | ICD-10-CM | POA: Diagnosis not present

## 2018-04-25 DIAGNOSIS — C3431 Malignant neoplasm of lower lobe, right bronchus or lung: Secondary | ICD-10-CM | POA: Diagnosis present

## 2018-04-26 ENCOUNTER — Encounter: Payer: Self-pay | Admitting: Oncology

## 2018-04-26 ENCOUNTER — Inpatient Hospital Stay: Payer: BLUE CROSS/BLUE SHIELD

## 2018-04-26 ENCOUNTER — Ambulatory Visit
Admission: RE | Admit: 2018-04-26 | Discharge: 2018-04-26 | Disposition: A | Discharge status: Home / Self Care | Payer: BLUE CROSS/BLUE SHIELD | Source: Ambulatory Visit | Attending: Radiation Oncology | Admitting: Radiation Oncology

## 2018-04-26 ENCOUNTER — Inpatient Hospital Stay (HOSPITAL_BASED_OUTPATIENT_CLINIC_OR_DEPARTMENT_OTHER): Payer: BLUE CROSS/BLUE SHIELD | Admitting: Oncology

## 2018-04-26 ENCOUNTER — Other Ambulatory Visit: Payer: Self-pay

## 2018-04-26 VITALS — BP 130/71 | HR 69 | Temp 96.9°F | Resp 18 | Wt 160.0 lb

## 2018-04-26 VITALS — BP 124/80 | HR 66 | Resp 18

## 2018-04-26 DIAGNOSIS — R197 Diarrhea, unspecified: Secondary | ICD-10-CM | POA: Insufficient documentation

## 2018-04-26 DIAGNOSIS — C3431 Malignant neoplasm of lower lobe, right bronchus or lung: Secondary | ICD-10-CM

## 2018-04-26 DIAGNOSIS — Z87891 Personal history of nicotine dependence: Secondary | ICD-10-CM

## 2018-04-26 DIAGNOSIS — D6481 Anemia due to antineoplastic chemotherapy: Secondary | ICD-10-CM

## 2018-04-26 DIAGNOSIS — Z5111 Encounter for antineoplastic chemotherapy: Secondary | ICD-10-CM | POA: Insufficient documentation

## 2018-04-26 DIAGNOSIS — C3490 Malignant neoplasm of unspecified part of unspecified bronchus or lung: Secondary | ICD-10-CM

## 2018-04-26 DIAGNOSIS — R112 Nausea with vomiting, unspecified: Secondary | ICD-10-CM | POA: Insufficient documentation

## 2018-04-26 DIAGNOSIS — K210 Gastro-esophageal reflux disease with esophagitis: Secondary | ICD-10-CM

## 2018-04-26 DIAGNOSIS — Z7189 Other specified counseling: Secondary | ICD-10-CM

## 2018-04-26 DIAGNOSIS — Z510 Encounter for antineoplastic radiation therapy: Secondary | ICD-10-CM | POA: Diagnosis not present

## 2018-04-26 LAB — COMPREHENSIVE METABOLIC PANEL
ALT: 17 U/L (ref 0–44)
AST: 20 U/L (ref 15–41)
Albumin: 3.8 g/dL (ref 3.5–5.0)
Alkaline Phosphatase: 147 U/L — ABNORMAL HIGH (ref 38–126)
Anion gap: 6 (ref 5–15)
BUN: 12 mg/dL (ref 8–23)
CO2: 26 mmol/L (ref 22–32)
CREATININE: 0.87 mg/dL (ref 0.61–1.24)
Calcium: 8.8 mg/dL — ABNORMAL LOW (ref 8.9–10.3)
Chloride: 108 mmol/L (ref 98–111)
GFR calc Af Amer: >60 mL/min (ref >60)
GFR calc non Af Amer: >60 mL/min (ref >60)
Glucose, Bld: 102 mg/dL — ABNORMAL HIGH (ref 70–99)
Potassium: 3.9 mmol/L (ref 3.5–5.1)
Sodium: 140 mmol/L (ref 135–145)
Total Bilirubin: 0.9 mg/dL (ref 0.3–1.2)
Total Protein: 6.7 g/dL (ref 6.5–8.1)

## 2018-04-26 LAB — CBC WITH DIFFERENTIAL/PLATELET
Abs Immature Granulocytes: 0.02 10*3/uL (ref 0.00–0.07)
Basophils Absolute: 0 10*3/uL (ref 0.0–0.1)
Basophils Relative: 1 %
Eosinophils Absolute: 0.1 10*3/uL (ref 0.0–0.5)
Eosinophils Relative: 2 %
HEMATOCRIT: 41.9 % (ref 39.0–52.0)
Hemoglobin: 14.1 g/dL (ref 13.0–17.0)
Immature Granulocytes: 0 %
Lymphocytes Relative: 17 %
Lymphs Abs: 1.2 10*3/uL (ref 0.7–4.0)
MCH: 31.5 pg (ref 26.0–34.0)
MCHC: 33.7 g/dL (ref 30.0–36.0)
MCV: 93.7 fL (ref 80.0–100.0)
MONO ABS: 0.6 10*3/uL (ref 0.1–1.0)
Monocytes Relative: 9 %
Neutro Abs: 4.7 10*3/uL (ref 1.7–7.7)
Neutrophils Relative %: 71 %
Platelets: 200 10*3/uL (ref 150–400)
RBC: 4.47 MIL/uL (ref 4.22–5.81)
RDW: 13 % (ref 11.5–15.5)
WBC: 6.6 10*3/uL (ref 4.0–10.5)
nRBC: 0 % (ref 0.0–0.2)

## 2018-04-26 MED ORDER — SODIUM CHLORIDE 0.9% FLUSH
10.0000 mL | INTRAVENOUS | Status: DC | PRN
  Administered 2018-04-26: 10 mL via INTRAVENOUS
  Filled 2018-04-26: qty 10

## 2018-04-26 MED ORDER — SODIUM CHLORIDE 0.9 % IV SOLN
230.0000 mg | Freq: Once | INTRAVENOUS | Status: AC
  Administered 2018-04-26: 230 mg via INTRAVENOUS
  Filled 2018-04-26: qty 23

## 2018-04-26 MED ORDER — TRAZODONE HCL 50 MG PO TABS: 50.0000 mg | ORAL_TABLET | Freq: Every day | ORAL | 0 refills | Status: DC

## 2018-04-26 MED ORDER — FAMOTIDINE IN NACL 20-0.9 MG/50ML-% IV SOLN
20.0000 mg | Freq: Once | INTRAVENOUS | Status: AC
  Administered 2018-04-26: 20 mg via INTRAVENOUS
  Filled 2018-04-26: qty 50

## 2018-04-26 MED ORDER — SODIUM CHLORIDE 0.9 % IV SOLN
45.0000 mg/m2 | Freq: Once | INTRAVENOUS | Status: AC
  Administered 2018-04-26: 84 mg via INTRAVENOUS
  Filled 2018-04-26: qty 14

## 2018-04-26 MED ORDER — SODIUM CHLORIDE 0.9 % IV SOLN
20.0000 mg | Freq: Once | INTRAVENOUS | Status: AC
  Administered 2018-04-26: 20 mg via INTRAVENOUS
  Filled 2018-04-26: qty 2

## 2018-04-26 MED ORDER — SODIUM CHLORIDE 0.9 % IV SOLN
Freq: Once | INTRAVENOUS | Status: AC
  Administered 2018-04-26: 10:00:00 via INTRAVENOUS
  Filled 2018-04-26: qty 250

## 2018-04-26 MED ORDER — PALONOSETRON HCL INJECTION 0.25 MG/5ML
0.2500 mg | Freq: Once | INTRAVENOUS | Status: AC
  Administered 2018-04-26: 0.25 mg via INTRAVENOUS
  Filled 2018-04-26: qty 5

## 2018-04-26 MED ORDER — HEPARIN SOD (PORK) LOCK FLUSH 100 UNIT/ML IV SOLN
500.0000 [IU] | Freq: Once | INTRAVENOUS | Status: AC
  Administered 2018-04-26: 500 [IU] via INTRAVENOUS
  Filled 2018-04-26: qty 5

## 2018-04-26 MED ORDER — DIPHENHYDRAMINE HCL 50 MG/ML IJ SOLN
50.0000 mg | Freq: Once | INTRAMUSCULAR | Status: AC
  Administered 2018-04-26: 50 mg via INTRAVENOUS
  Filled 2018-04-26: qty 1

## 2018-04-26 NOTE — Progress Notes (Signed)
Hematology/Oncology follow up note [facility] [phone] [phone]   Patient Care Team: Baxter Hire, MD as PCP - General (Internal Medicine) Sherren Mocha, MD as PCP - Cardiology (Cardiology) Telford Nab, RN as Registered Nurse  REFERRING PROVIDER: Dr.Fleming REASON FOR VISIT:  Follow-up for management of stage III non-small cell lung cancer   HISTORY OF PRESENTING ILLNESS:  Timothy Yoder is a  63 y.o.  male with PMH listed below who was referred to me for evaluation of lung mass.  Patient is a former smoker quit smoking 7 years ago.  40-pack-year. Patient chest lung cancer screening on 03/11/2018. CT scan showed 2.4 cm right lower lobe nodule with a new necrotic.  Subcarinal lymph node. PET scan showed hypermetabolic right lower pulmonary nodule, consistent with primary bronchogenic carcinoma.  Fluid density subcarinal structure demonstrate no significant hypermetabolism.  Although this could represent an incidental benign lesion such as a cyst given the interval development since 02/24/2017, necrotic adenopathy cannot be excluded and tissue sampling should be considered.  Patient was seen by Dr. Faith Rogue for initial evaluation on 03/13/2018.  Patient has not got PET scan at that time. Today patient was accompanied by his wife to the oncology clinic to discuss management plan.  And PET scan results. Patient reports chronic shortness of breath with moderate exertion.  Appetite is good denies any fever, chills, hemoptysis, weight loss.  # underwent biopsy via bronchoscopy.  Subcarinal lymph node biopsy positive for non-small cell lung cancer, favoring poorly differentiated adenocarcinoma. #Mediport was placed by Dr. Genevive Bi   INTERVAL HISTORY Timothy Yoder is a 63 y.o. male who has above history reviewed by me today presents for follow up visit for management of stage III non-small cell lung cancer Reports doing well.  Patient has been  started on radiation this week. No new complaints.  Chronic shortness of breath at baseline.  Review of Systems  Constitutional: Positive for fatigue. Negative for appetite change, chills and fever.  HENT:   Negative for hearing loss and voice change.   Eyes: Negative for eye problems.  Respiratory: Negative for chest tightness and cough.   Cardiovascular: Negative for chest pain.  Gastrointestinal: Negative for abdominal distention, abdominal pain and blood in stool.  Endocrine: Negative for hot flashes.  Genitourinary: Negative for difficulty urinating and frequency.   Musculoskeletal: Negative for arthralgias.  Skin: Negative for itching and rash.  Neurological: Negative for extremity weakness.  Hematological: Negative for adenopathy.  Psychiatric/Behavioral: Negative for confusion.   MEDICAL HISTORY:  Past Medical History:  Diagnosis Date  . BPH (benign prostatic hyperplasia)   . CAD (coronary artery disease)    a. 04/2014 anterolateral STEMI/PCI: LM nl, LAD 74m(2.75x16 Promus DES, D1 50p, LCX nl, RI nl, OM1 100 small/chronic, RCA dominant, [id number](FFR 0.90->Med Rx), RPDA 50, RPLA 70, EF 60%.  . CMarland KitchenPD (chronic obstructive pulmonary disease) (HCC)    Dr FleRaul Del Former tobacco use   . Gastroesophageal reflux disease   . HTN (hypertension)   . Hyperlipidemia   . Hypokalemia   . Malignant [place] of lung ([place]  . Myocardial infarction ([place]   Dr MicSherren Mocha Stented coronary artery     SURGICAL HISTORY: Past Surgical History:  Procedure Laterality Date  . CARDIAC CATHETERIZATION    . CHOLECYSTECTOMY N/A 02/15/2016   Procedure: LAPAROSCOPIC CHOLECYSTECTOMY WITH INTRAOPERATIVE CHOLANGIOGRAM;  Surgeon: JarLeonie GreenD;  Location: [facility] ORS;  Service: General;  Laterality: N/A;  .  COLONOSCOPY    . COLONOSCOPY WITH PROPOFOL N/A 08/02/2015   Procedure: COLONOSCOPY WITH PROPOFOL;  Surgeon: Manya Silvas, MD;  Location: [facility] ENDOSCOPY;   Service: Endoscopy;  Laterality: N/A;  . CORONARY ANGIOPLASTY  04/2014   STENT  . ENDOBRONCHIAL ULTRASOUND N/A 03/26/2018   Procedure: ENDOBRONCHIAL ULTRASOUND;  Surgeon: Flora Lipps, MD;  Location: [facility] ORS;  Service: Cardiopulmonary;  Laterality: N/A;  . ESOPHAGOGASTRODUODENOSCOPY (EGD) WITH PROPOFOL N/A 08/02/2015   Procedure: ESOPHAGOGASTRODUODENOSCOPY (EGD) WITH PROPOFOL;  Surgeon: Manya Silvas, MD;  Location: [facility] ENDOSCOPY;  Service: Endoscopy;  Laterality: N/A;  . FLEXIBLE BRONCHOSCOPY N/A 03/26/2018   Procedure: FLEXIBLE BRONCHOSCOPY;  Surgeon: Flora Lipps, MD;  Location: [facility] ORS;  Service: Cardiopulmonary;  Laterality: N/A;  . KNEE ARTHROSCOPY Left   . LEFT HEART CATHETERIZATION WITH CORONARY ANGIOGRAM N/A 05/03/2014   Procedure: LEFT HEART CATHETERIZATION WITH CORONARY ANGIOGRAM;  Surgeon: Blane Ohara, MD;  Location: [facility] CATH LAB;  Service: Cardiovascular;  Laterality: N/A;  . PERCUTANEOUS CORONARY STENT INTERVENTION (PCI-S) N/A 05/05/2014   Procedure: PERCUTANEOUS CORONARY STENT INTERVENTION (PCI-S);  Surgeon: Peter M Martinique, MD;  Location: [facility] CATH LAB;  Service: Cardiovascular;  Laterality: N/A;  . PORTACATH PLACEMENT Left 04/04/2018   Procedure: INSERTION PORT-A-CATH;  Surgeon: [name], MD;  Location: [facility] ORS;  Service: General;  Laterality: Left;    SOCIAL HISTORY: Social History   Socioeconomic History  . Marital status: Married    Spouse name: Not on file  . Number of children: Not on file  . Years of education: Not on file  . Highest education level: Not on file  Occupational History  . Not on file  Social Needs  . Financial resource strain: Not on file  . Food insecurity:    Worry: Not on file    Inability: Not on file  . Transportation needs:    Medical: Not on file    Non-medical: Not on file  Tobacco Use  . Smoking status: Former Smoker    Packs/day: 1.00    Years: 40.00    Pack years: 40.00    Types: Cigarettes    Last attempt to quit:  02/14/2011    Years since quitting: 7.2  . Smokeless tobacco: Never Used  Substance and Sexual Activity  . Alcohol use: No  . Drug use: No  . Sexual activity: Not on file  Lifestyle  . Physical activity:    Days per week: Not on file    Minutes per session: Not on file  . Stress: Not on file  Relationships  . Social connections:    Talks on phone: Not on file    Gets together: Not on file    Attends religious service: Not on file    Active member of club or organization: Not on file    Attends meetings of clubs or organizations: Not on file    Relationship status: Not on file  . Intimate partner violence:    Fear of current or ex partner: Not on file    Emotionally abused: Not on file    Physically abused: Not on file    Forced sexual activity: Not on file  Other Topics Concern  . Not on file  Social History Narrative   The patient is married. He lives in [place]. He works full-time. He quit smoking in 2013.    FAMILY HISTORY: Family History  Problem Relation Age of Onset  . CAD Mother 67       Died of an MI  .  CAD Father 35       Died of a massive MI  . COPD Sister   . Lung cancer Brother   . Bone cancer Paternal Uncle   . Colon cancer Neg Hx   . Breast cancer Neg Hx     ALLERGIES:  has No Known Allergies.  MEDICATIONS:  Current Outpatient Medications  Medication Sig Dispense Refill  . acetaminophen (TYLENOL) 500 MG tablet Take 1,000 mg by mouth every 6 (six) hours as needed (for pain.).    Marland Kitchen albuterol (PROAIR HFA) 108 (90 Base) MCG/ACT inhaler Inhale 2 puffs into the lungs every 6 (six) hours as needed for wheezing or shortness of breath.     Marland Kitchen aspirin EC 81 MG tablet Take 81 mg by mouth daily.    Marland Kitchen atorvastatin (LIPITOR) 80 MG tablet Take 1 tablet (80 mg total) by mouth daily at 6 PM. 90 tablet 3  . carvedilol (COREG) 6.25 MG tablet Take 1 tablet (6.25 mg total) by mouth 2 (two) times daily. 180 tablet 3  . lidocaine-prilocaine (EMLA) cream Apply to  affected area once 30 g 3  . lisinopril (PRINIVIL,ZESTRIL) 10 MG tablet Take 1 tablet (10 mg total) by mouth daily. 90 tablet 3  . nitroGLYCERIN (NITROSTAT) 0.4 MG SL tablet Place 1 tablet (0.4 mg total) under the tongue every 5 (five) minutes as needed for chest pain. X 3 doses 25 tablet 3  . omeprazole (PRILOSEC) 40 MG capsule Take 40 mg by mouth 2 (two) times daily.     . ondansetron (ZOFRAN) 8 MG tablet Take 1 tablet (8 mg total) by mouth 2 (two) times daily as needed for refractory nausea / vomiting. Start on day 3 after chemo. 30 tablet 1  . potassium chloride SA (KLOR-CON M20) 20 MEQ tablet Take 1 tablet (20 mEq total) by mouth daily. 90 tablet 1  . prochlorperazine (COMPAZINE) 10 MG tablet Take 1 tablet (10 mg total) by mouth every 6 (six) hours as needed (Nausea or vomiting). 30 tablet 1  . tamsulosin (FLOMAX) 0.4 MG CAPS capsule Take 0.4 mg by mouth daily after supper.    . umeclidinium-vilanterol (ANORO ELLIPTA) 62.5-25 MCG/INH AEPB Inhale 1 puff into the lungs every morning.    . colestipol (COLESTID) 1 g tablet Take 2 g by mouth daily as needed (stomach issues).     . traZODone (DESYREL) 50 MG tablet Take 1 tablet (50 mg total) by mouth at bedtime. 30 tablet 0   No current facility-administered medications for this visit.    Facility-Administered Medications Ordered in Other Visits  Medication Dose Route Frequency Provider Last Rate Last Dose  . sodium chloride flush (NS) 0.9 % injection 10 mL  10 mL Intravenous PRN Earlie Server, MD   10 mL at 04/26/18 0849     PHYSICAL EXAMINATION: ECOG PERFORMANCE STATUS: 0 - Asymptomatic Vitals:   04/26/18 0905  BP: 130/71  Pulse: 69  Resp: 18  Temp: (!) 96.9 F (36.1 C)   Filed Weights   04/26/18 0905  Weight: 160 lb (72.6 kg)    Physical Exam Constitutional:      General: He is not in acute distress. HENT:     Head: Normocephalic and atraumatic.  Eyes:     General: No scleral icterus.    Pupils: Pupils are equal, round, and  reactive to light.  Neck:     Musculoskeletal: Normal range of motion and neck supple.  Cardiovascular:     Rate and Rhythm: Normal rate and regular  rhythm.     Heart sounds: Normal heart sounds.  Pulmonary:     Effort: Pulmonary effort is normal. No respiratory distress.     Breath sounds: No wheezing.  Abdominal:     General: Bowel sounds are normal. There is no distension.     Palpations: Abdomen is soft. There is no mass.     Tenderness: There is no abdominal tenderness.  Musculoskeletal: Normal range of motion.        General: No deformity.  Skin:    General: Skin is warm and dry.     Findings: No erythema or rash.  Neurological:     Mental Status: He is alert and oriented to person, place, and time.     Cranial Nerves: No cranial nerve deficit.     Coordination: Coordination normal.  Psychiatric:        Behavior: Behavior normal.        Thought Content: Thought content normal.      LABORATORY DATA:  I have reviewed the data as listed Lab Results  Component Value Date   WBC 6.6 04/26/2018   HGB 14.1 04/26/2018   HCT 41.9 04/26/2018   MCV 93.7 04/26/2018   PLT 200 04/26/2018   Recent Labs    03/25/18 0820 04/26/18 0849  NA 140 140  K 3.7 3.9  CL 105 108  CO2 29 26  GLUCOSE 87 102*  BUN 8 12  CREATININE 0.91 0.87  CALCIUM 9.0 8.8*  GFRNONAA >60 >60  GFRAA >60 >60  PROT  --  6.7  ALBUMIN  --  3.8  AST  --  20  ALT  --  17  ALKPHOS  --  147*  BILITOT  --  0.9   Iron/TIBC/Ferritin/ %Sat No results found for: IRON, TIBC, FERRITIN, IRONPCTSAT   RADIOGRAPHIC STUDIES: I have personally reviewed the radiological images as listed and agreed with the findings in the report. 03/11/2018 CT chest lung screen 1. New 2.4 cm right lower lobe nodule with a new necrotic appearing subcarinal lymph node. Lung-RADS 4B, suspicious. Additional imaging evaluation or consultation with Pulmonology or Thoracic Surgery recommended. These results will be called to the ordering  clinician or representative by the Radiologist Assistant, and communication documented in the PACS or zVision Dashboard. 2. Aortic atherosclerosis (ICD10-170.0). Coronary arterycalcification.3.  Emphysema (ICD10-J43.9). 03/13/2018 PET scan # Hypermetabolic right lower lobe pulmonary nodule, consistent with primary bronchogenic carcinoma. Fluid density subcarinal structure demonstrates no significant hypermetabolism. Although this could represent an incidental benign lesion such as a bronchogenic cyst, given interval development since 02/24/2017, necrotic adenopathy cannot be excluded and tissue sampling should be considered.  MRI brain 04/01/2018  Normal MRI head with and without contrast for age.  No intracranial metastasis.   ASSESSMENT & PLAN:  1. Malignant neoplasm of lower lobe of right lung ([place])   2. Goals of care, counseling/discussion   3. Encounter for antineoplastic chemotherapy   #MRI was independently reviewed by me and discussed with patient.  No intracranial metastasis. #Labs reviewed and discussed with patient.  Patient has started on radiation this week. Proceed with carboplatin AUC of 2, Taxol 45 mg/m weekly.  First dose today. Patient has EMLA cream, antiemetics and he has been to chemo class.  We spent sufficient time to discuss many aspect of care, questions were answered to patient's satisfaction. All questions were answered. The patient knows to call the clinic with any problems questions or concerns.   Return of visit: 1 week Total face to face  encounter time for this patient visit was 66mn. >50% of the time was  spent in counseling and coordination of care.   ZEarlie Server MD, PhD Hematology Oncology [facility] CaguasPager- [phone]/03/20

## 2018-04-26 NOTE — Progress Notes (Signed)
Patient here for follow up. Will receive first treatment of carbo taxol. Pt states being very anxious and would like something prescribed for it.

## 2018-04-29 ENCOUNTER — Encounter: Payer: Self-pay | Admitting: *Deleted

## 2018-04-29 ENCOUNTER — Ambulatory Visit
Admission: RE | Admit: 2018-04-29 | Discharge: 2018-04-29 | Disposition: A | Discharge status: Home / Self Care | Payer: BLUE CROSS/BLUE SHIELD | Source: Ambulatory Visit | Attending: Radiation Oncology | Admitting: Radiation Oncology

## 2018-04-29 DIAGNOSIS — Z510 Encounter for antineoplastic radiation therapy: Secondary | ICD-10-CM | POA: Diagnosis not present

## 2018-04-29 NOTE — Progress Notes (Signed)
  Oncology Nurse Navigator Documentation  Navigator Location: [facility] (04/29/18 1100)   )Navigator Encounter Type: Treatment (04/29/18 1100)                   Treatment Initiated Date: 04/22/18 (04/29/18 1100)   Treatment Phase: Treatment (04/29/18 1100) Barriers/Navigation Needs: No barriers at this time (04/29/18 1100)   Interventions: None required (04/29/18 1100)                      Time Spent with Patient: 15 (04/29/18 1100)

## 2018-04-30 ENCOUNTER — Ambulatory Visit
Admission: RE | Admit: 2018-04-30 | Discharge: 2018-04-30 | Disposition: A | Discharge status: Home / Self Care | Payer: BLUE CROSS/BLUE SHIELD | Source: Ambulatory Visit | Attending: Radiation Oncology | Admitting: Radiation Oncology

## 2018-04-30 DIAGNOSIS — Z510 Encounter for antineoplastic radiation therapy: Secondary | ICD-10-CM | POA: Diagnosis not present

## 2018-05-01 ENCOUNTER — Ambulatory Visit
Admission: RE | Admit: 2018-05-01 | Discharge: 2018-05-01 | Disposition: A | Discharge status: Home / Self Care | Payer: BLUE CROSS/BLUE SHIELD | Source: Ambulatory Visit | Attending: Radiation Oncology | Admitting: Radiation Oncology

## 2018-05-01 DIAGNOSIS — Z510 Encounter for antineoplastic radiation therapy: Secondary | ICD-10-CM | POA: Diagnosis not present

## 2018-05-02 ENCOUNTER — Ambulatory Visit
Admission: RE | Admit: 2018-05-02 | Discharge: 2018-05-02 | Disposition: A | Discharge status: Home / Self Care | Payer: BLUE CROSS/BLUE SHIELD | Source: Ambulatory Visit | Attending: Radiation Oncology | Admitting: Radiation Oncology

## 2018-05-02 DIAGNOSIS — Z510 Encounter for antineoplastic radiation therapy: Secondary | ICD-10-CM | POA: Diagnosis not present

## 2018-05-03 ENCOUNTER — Ambulatory Visit
Admission: RE | Admit: 2018-05-03 | Discharge: 2018-05-03 | Disposition: A | Discharge status: Home / Self Care | Payer: BLUE CROSS/BLUE SHIELD | Source: Ambulatory Visit | Attending: Radiation Oncology | Admitting: Radiation Oncology

## 2018-05-03 ENCOUNTER — Ambulatory Visit: Payer: BLUE CROSS/BLUE SHIELD | Admitting: Oncology

## 2018-05-03 ENCOUNTER — Ambulatory Visit: Payer: BLUE CROSS/BLUE SHIELD

## 2018-05-03 ENCOUNTER — Other Ambulatory Visit: Payer: BLUE CROSS/BLUE SHIELD

## 2018-05-03 DIAGNOSIS — Z510 Encounter for antineoplastic radiation therapy: Secondary | ICD-10-CM | POA: Diagnosis not present

## 2018-05-06 ENCOUNTER — Inpatient Hospital Stay (HOSPITAL_BASED_OUTPATIENT_CLINIC_OR_DEPARTMENT_OTHER): Payer: BLUE CROSS/BLUE SHIELD | Admitting: Oncology

## 2018-05-06 ENCOUNTER — Ambulatory Visit
Admission: RE | Admit: 2018-05-06 | Discharge: 2018-05-06 | Disposition: A | Discharge status: Home / Self Care | Payer: BLUE CROSS/BLUE SHIELD | Source: Ambulatory Visit | Attending: Radiation Oncology | Admitting: Radiation Oncology

## 2018-05-06 ENCOUNTER — Inpatient Hospital Stay: Payer: BLUE CROSS/BLUE SHIELD

## 2018-05-06 ENCOUNTER — Other Ambulatory Visit: Payer: Self-pay

## 2018-05-06 ENCOUNTER — Encounter: Payer: Self-pay | Admitting: Oncology

## 2018-05-06 VITALS — BP 135/78 | HR 57 | Temp 96.6°F | Resp 18 | Wt 167.6 lb

## 2018-05-06 DIAGNOSIS — C3431 Malignant neoplasm of lower lobe, right bronchus or lung: Secondary | ICD-10-CM

## 2018-05-06 DIAGNOSIS — R112 Nausea with vomiting, unspecified: Secondary | ICD-10-CM | POA: Diagnosis not present

## 2018-05-06 DIAGNOSIS — Z87891 Personal history of nicotine dependence: Secondary | ICD-10-CM

## 2018-05-06 DIAGNOSIS — Z510 Encounter for antineoplastic radiation therapy: Secondary | ICD-10-CM | POA: Diagnosis not present

## 2018-05-06 DIAGNOSIS — Z5111 Encounter for antineoplastic chemotherapy: Secondary | ICD-10-CM

## 2018-05-06 LAB — CBC WITH DIFFERENTIAL/PLATELET
ABS IMMATURE GRANULOCYTES: 0.07 10*3/uL (ref 0.00–0.07)
Basophils Absolute: 0 10*3/uL (ref 0.0–0.1)
Basophils Relative: 1 %
Eosinophils Absolute: 0.1 10*3/uL (ref 0.0–0.5)
Eosinophils Relative: 2 %
HCT: 40.1 % (ref 39.0–52.0)
Hemoglobin: 13.3 g/dL (ref 13.0–17.0)
Immature Granulocytes: 1 %
Lymphocytes Relative: 20 %
Lymphs Abs: 1 10*3/uL (ref 0.7–4.0)
MCH: 31.6 pg (ref 26.0–34.0)
MCHC: 33.2 g/dL (ref 30.0–36.0)
MCV: 95.2 fL (ref 80.0–100.0)
MONOS PCT: 10 %
Monocytes Absolute: 0.5 10*3/uL (ref 0.1–1.0)
Neutro Abs: 3.2 10*3/uL (ref 1.7–7.7)
Neutrophils Relative %: 66 %
Platelets: 203 10*3/uL (ref 150–400)
RBC: 4.21 MIL/uL — ABNORMAL LOW (ref 4.22–5.81)
RDW: 13.1 % (ref 11.5–15.5)
WBC: 4.9 10*3/uL (ref 4.0–10.5)
nRBC: 0 % (ref 0.0–0.2)

## 2018-05-06 LAB — COMPREHENSIVE METABOLIC PANEL
ALT: 17 U/L (ref 0–44)
AST: 19 U/L (ref 15–41)
Albumin: 3.6 g/dL (ref 3.5–5.0)
Alkaline Phosphatase: 126 U/L (ref 38–126)
Anion gap: 5 (ref 5–15)
BUN: 11 mg/dL (ref 8–23)
CHLORIDE: 111 mmol/L (ref 98–111)
CO2: 26 mmol/L (ref 22–32)
Calcium: 8.8 mg/dL — ABNORMAL LOW (ref 8.9–10.3)
Creatinine, Ser: 0.92 mg/dL (ref 0.61–1.24)
GFR calc Af Amer: >60 mL/min (ref >60)
GFR calc non Af Amer: >60 mL/min (ref >60)
Glucose, Bld: 112 mg/dL — ABNORMAL HIGH (ref 70–99)
Potassium: 4.2 mmol/L (ref 3.5–5.1)
Sodium: 142 mmol/L (ref 135–145)
Total Bilirubin: 0.5 mg/dL (ref 0.3–1.2)
Total Protein: 6.5 g/dL (ref 6.5–8.1)

## 2018-05-06 MED ORDER — SODIUM CHLORIDE 0.9% FLUSH
10.0000 mL | Freq: Once | INTRAVENOUS | Status: AC
  Administered 2018-05-06: 10 mL via INTRAVENOUS
  Filled 2018-05-06: qty 10

## 2018-05-06 MED ORDER — FAMOTIDINE IN NACL 20-0.9 MG/50ML-% IV SOLN
20.0000 mg | Freq: Once | INTRAVENOUS | Status: AC
  Administered 2018-05-06: 20 mg via INTRAVENOUS
  Filled 2018-05-06: qty 50

## 2018-05-06 MED ORDER — DIPHENHYDRAMINE HCL 50 MG/ML IJ SOLN
50.0000 mg | Freq: Once | INTRAMUSCULAR | Status: AC
  Administered 2018-05-06: 50 mg via INTRAVENOUS
  Filled 2018-05-06: qty 1

## 2018-05-06 MED ORDER — PALONOSETRON HCL INJECTION 0.25 MG/5ML
0.2500 mg | Freq: Once | INTRAVENOUS | Status: AC
  Administered 2018-05-06: 0.25 mg via INTRAVENOUS
  Filled 2018-05-06: qty 5

## 2018-05-06 MED ORDER — SODIUM CHLORIDE 0.9 % IV SOLN
45.0000 mg/m2 | Freq: Once | INTRAVENOUS | Status: AC
  Administered 2018-05-06: 84 mg via INTRAVENOUS
  Filled 2018-05-06: qty 14

## 2018-05-06 MED ORDER — SODIUM CHLORIDE 0.9 % IV SOLN
230.0000 mg | Freq: Once | INTRAVENOUS | Status: AC
  Administered 2018-05-06: 230 mg via INTRAVENOUS
  Filled 2018-05-06: qty 23

## 2018-05-06 MED ORDER — SODIUM CHLORIDE 0.9 % IV SOLN
20.0000 mg | Freq: Once | INTRAVENOUS | Status: AC
  Administered 2018-05-06: 20 mg via INTRAVENOUS
  Filled 2018-05-06: qty 2

## 2018-05-06 MED ORDER — SODIUM CHLORIDE 0.9 % IV SOLN
Freq: Once | INTRAVENOUS | Status: AC
  Administered 2018-05-06: 10:00:00 via INTRAVENOUS
  Filled 2018-05-06: qty 250

## 2018-05-06 MED ORDER — HEPARIN SOD (PORK) LOCK FLUSH 100 UNIT/ML IV SOLN
500.0000 [IU] | Freq: Once | INTRAVENOUS | Status: AC
  Administered 2018-05-06: 500 [IU] via INTRAVENOUS
  Filled 2018-05-06: qty 5

## 2018-05-06 NOTE — Progress Notes (Signed)
Hematology/Oncology follow up note [facility] Telephone:([phone] Fax:([phone]) [phone]   Patient Care Team: Baxter Hire, MD as PCP - General (Internal Medicine) Sherren Mocha, MD as PCP - Cardiology (Cardiology) Telford Nab, RN as Registered Nurse  REFERRING PROVIDER: Dr.Fleming REASON FOR VISIT:  Follow-up for management of stage III non-small cell lung cancer   HISTORY OF PRESENTING ILLNESS:  Timothy Yoder is a  63 y.o.  male with PMH listed below who was referred to me for evaluation of lung mass.  Patient is a former smoker quit smoking 7 years ago.  40-pack-year. Patient chest lung cancer screening on 03/11/2018. CT scan showed 2.4 cm right lower lobe nodule with a new necrotic.  Subcarinal lymph node. PET scan showed hypermetabolic right lower pulmonary nodule, consistent with primary bronchogenic carcinoma.  Fluid density subcarinal structure demonstrate no significant hypermetabolism.  Although this could represent an incidental benign lesion such as a cyst given the interval development since 02/24/2017, necrotic adenopathy cannot be excluded and tissue sampling should be considered.  Patient was seen by Dr. Faith Rogue for initial evaluation on 03/13/2018.  Patient has not got PET scan at that time. Today patient was accompanied by his wife to the oncology clinic to discuss management plan.  And PET scan results. Patient reports chronic shortness of breath with moderate exertion.  Appetite is good denies any fever, chills, hemoptysis, weight loss.  # underwent biopsy via bronchoscopy.  Subcarinal lymph node biopsy positive for non-small cell lung cancer, favoring poorly differentiated adenocarcinoma. #Mediport was placed by Dr. Genevive Bi   INTERVAL HISTORY Timothy Yoder is a 63 y.o. male who has above history reviewed by me today presents for follow up visit for management of stage III non-small cell lung cancer. Patient reports doing well.  He had  mild nausea during the interval which is manageable.  He does have questions about antiemetics and he brought his pill bottles today to the clinic.  I spent time and discussed with patient about the instructions.  #Fatigue, reports feeling" wiped out' a few days after last chemotherapy.  Spontaneously resolved. Today he feels well.  No new complaints.  Chronic shortness of breath at baseline.   Review of Systems  Constitutional: Positive for fatigue. Negative for appetite change, chills, fever and unexpected weight change.  HENT:   Negative for hearing loss and voice change.   Eyes: Negative for eye problems and icterus.  Respiratory: Negative for chest tightness, cough and shortness of breath.   Cardiovascular: Negative for chest pain and leg swelling.  Gastrointestinal: Negative for abdominal distention, abdominal pain and blood in stool.  Endocrine: Negative for hot flashes.  Genitourinary: Negative for difficulty urinating, dysuria and frequency.   Musculoskeletal: Negative for arthralgias.  Skin: Negative for itching and rash.  Neurological: Negative for extremity weakness, light-headedness and numbness.  Hematological: Negative for adenopathy. Does not bruise/bleed easily.  Psychiatric/Behavioral: Negative for confusion.   MEDICAL HISTORY:  Past Medical History:  Diagnosis Date  . BPH (benign prostatic hyperplasia)   . CAD (coronary artery disease)    a. 04/2014 anterolateral STEMI/PCI: LM nl, LAD 53m(2.75x16 Promus DES, D1 50p, LCX nl, RI nl, OM1 100 small/chronic, RCA dominant, [id number](FFR 0.90->Med Rx), RPDA 50, RPLA 70, EF 60%.  . CMarland KitchenPD (chronic obstructive pulmonary disease) (HCC)    Dr FleRaul Del Former tobacco use   . Gastroesophageal reflux disease   . HTN (hypertension)   . Hyperlipidemia   . Hypokalemia   .  Malignant neoplasm of lung ([place]) 04/01/2018  . Myocardial infarction ([place]) 04/2014   Dr Sherren Mocha  . Stented coronary artery     SURGICAL  HISTORY: Past Surgical History:  Procedure Laterality Date  . CARDIAC CATHETERIZATION    . CHOLECYSTECTOMY N/A 02/15/2016   Procedure: LAPAROSCOPIC CHOLECYSTECTOMY WITH INTRAOPERATIVE CHOLANGIOGRAM;  Surgeon: Leonie Green, MD;  Location: [facility] ORS;  Service: General;  Laterality: N/A;  . COLONOSCOPY    . COLONOSCOPY WITH PROPOFOL N/A 08/02/2015   Procedure: COLONOSCOPY WITH PROPOFOL;  Surgeon: Manya Silvas, MD;  Location: [facility] ENDOSCOPY;  Service: Endoscopy;  Laterality: N/A;  . CORONARY ANGIOPLASTY  04/2014   STENT  . ENDOBRONCHIAL ULTRASOUND N/A 03/26/2018   Procedure: ENDOBRONCHIAL ULTRASOUND;  Surgeon: Flora Lipps, MD;  Location: [facility] ORS;  Service: Cardiopulmonary;  Laterality: N/A;  . ESOPHAGOGASTRODUODENOSCOPY (EGD) WITH PROPOFOL N/A 08/02/2015   Procedure: ESOPHAGOGASTRODUODENOSCOPY (EGD) WITH PROPOFOL;  Surgeon: Manya Silvas, MD;  Location: [facility] ENDOSCOPY;  Service: Endoscopy;  Laterality: N/A;  . FLEXIBLE BRONCHOSCOPY N/A 03/26/2018   Procedure: FLEXIBLE BRONCHOSCOPY;  Surgeon: Flora Lipps, MD;  Location: [facility] ORS;  Service: Cardiopulmonary;  Laterality: N/A;  . KNEE ARTHROSCOPY Left   . LEFT HEART CATHETERIZATION WITH CORONARY ANGIOGRAM N/A 05/03/2014   Procedure: LEFT HEART CATHETERIZATION WITH CORONARY ANGIOGRAM;  Surgeon: Blane Ohara, MD;  Location: [facility] CATH LAB;  Service: Cardiovascular;  Laterality: N/A;  . PERCUTANEOUS CORONARY STENT INTERVENTION (PCI-S) N/A 05/05/2014   Procedure: PERCUTANEOUS CORONARY STENT INTERVENTION (PCI-S);  Surgeon: Peter M Martinique, MD;  Location: [facility] CATH LAB;  Service: Cardiovascular;  Laterality: N/A;  . PORTACATH PLACEMENT Left 04/04/2018   Procedure: INSERTION PORT-A-CATH;  Surgeon: [name], MD;  Location: [facility] ORS;  Service: General;  Laterality: Left;    SOCIAL HISTORY: Social History   Socioeconomic History  . Marital status: Married    Spouse name: Not on file  . Number of children: Not on file  . Years of  education: Not on file  . Highest education level: Not on file  Occupational History  . Not on file  Social Needs  . Financial resource strain: Not on file  . Food insecurity:    Worry: Not on file    Inability: Not on file  . Transportation needs:    Medical: Not on file    Non-medical: Not on file  Tobacco Use  . Smoking status: Former Smoker    Packs/day: 1.00    Years: 40.00    Pack years: 40.00    Types: Cigarettes    Last attempt to quit: 02/14/2011    Years since quitting: 7.2  . Smokeless tobacco: Never Used  Substance and Sexual Activity  . Alcohol use: No  . Drug use: No  . Sexual activity: Not on file  Lifestyle  . Physical activity:    Days per week: Not on file    Minutes per session: Not on file  . Stress: Not on file  Relationships  . Social connections:    Talks on phone: Not on file    Gets together: Not on file    Attends religious service: Not on file    Active member of club or organization: Not on file    Attends meetings of clubs or organizations: Not on file    Relationship status: Not on file  . Intimate partner violence:    Fear of current or ex partner: Not on file    Emotionally abused: Not on file    Physically abused:  Not on file    Forced sexual activity: Not on file  Other Topics Concern  . Not on file  Social History Narrative   The patient is married. He lives in [place]. He works full-time. He quit smoking in 2013.    FAMILY HISTORY: Family History  Problem Relation Age of Onset  . CAD Mother 84       Died of an MI  . CAD Father 29       Died of a massive MI  . COPD Sister   . Lung cancer Brother   . Bone cancer Paternal Uncle   . Colon cancer Neg Hx   . Breast cancer Neg Hx     ALLERGIES:  has No Known Allergies.  MEDICATIONS:  Current Outpatient Medications  Medication Sig Dispense Refill  . acetaminophen (TYLENOL) 500 MG tablet Take 1,000 mg by mouth every 6 (six) hours as needed (for pain.).    Marland Kitchen albuterol  (PROAIR HFA) 108 (90 Base) MCG/ACT inhaler Inhale 2 puffs into the lungs every 6 (six) hours as needed for wheezing or shortness of breath.     Marland Kitchen aspirin EC 81 MG tablet Take 81 mg by mouth daily.    Marland Kitchen atorvastatin (LIPITOR) 80 MG tablet Take 1 tablet (80 mg total) by mouth daily at 6 PM. 90 tablet 3  . carvedilol (COREG) 6.25 MG tablet Take 1 tablet (6.25 mg total) by mouth 2 (two) times daily. 180 tablet 3  . lidocaine-prilocaine (EMLA) cream Apply to affected area once 30 g 3  . lisinopril (PRINIVIL,ZESTRIL) 10 MG tablet Take 1 tablet (10 mg total) by mouth daily. 90 tablet 3  . nitroGLYCERIN (NITROSTAT) 0.4 MG SL tablet Place 1 tablet (0.4 mg total) under the tongue every 5 (five) minutes as needed for chest pain. X 3 doses 25 tablet 3  . omeprazole (PRILOSEC) 40 MG capsule Take 40 mg by mouth 2 (two) times daily.     . ondansetron (ZOFRAN) 8 MG tablet Take 1 tablet (8 mg total) by mouth 2 (two) times daily as needed for refractory nausea / vomiting. Start on day 3 after chemo. 30 tablet 1  . potassium chloride SA (KLOR-CON M20) 20 MEQ tablet Take 1 tablet (20 mEq total) by mouth daily. 90 tablet 1  . prochlorperazine (COMPAZINE) 10 MG tablet Take 1 tablet (10 mg total) by mouth every 6 (six) hours as needed (Nausea or vomiting). 30 tablet 1  . tamsulosin (FLOMAX) 0.4 MG CAPS capsule Take 0.4 mg by mouth daily after supper.    . traZODone (DESYREL) 50 MG tablet Take 1 tablet (50 mg total) by mouth at bedtime. 30 tablet 0  . umeclidinium-vilanterol (ANORO ELLIPTA) 62.5-25 MCG/INH AEPB Inhale 1 puff into the lungs every morning.    . colestipol (COLESTID) 1 g tablet Take 2 g by mouth daily as needed (stomach issues).      No current facility-administered medications for this visit.    Facility-Administered Medications Ordered in Other Visits  Medication Dose Route Frequency Provider Last Rate Last Dose  . 0.9 %  sodium chloride infusion   Intravenous Once Earlie Server, MD      . CARBOplatin  (PARAPLATIN) 230 mg in sodium chloride 0.9 % 250 mL chemo infusion  230 mg Intravenous Once Earlie Server, MD      . dexamethasone (DECADRON) 20 mg in sodium chloride 0.9 % 50 mL IVPB  20 mg Intravenous Once Earlie Server, MD      . diphenhydrAMINE (BENADRYL)  injection 50 mg  50 mg Intravenous Once Earlie Server, MD      . famotidine (PEPCID) IVPB 20 mg premix  20 mg Intravenous Once Earlie Server, MD      . heparin lock flush 100 unit/mL  500 Units Intravenous Once Earlie Server, MD      . PACLitaxel (TAXOL) 84 mg in sodium chloride 0.9 % 250 mL chemo infusion (</= 79m/m2)  45 mg/m2 (Treatment Plan Recorded) Intravenous Once YEarlie Server MD      . palonosetron (ALOXI) injection 0.25 mg  0.25 mg Intravenous Once YEarlie Server MD         PHYSICAL EXAMINATION: ECOG PERFORMANCE STATUS: 0 - Asymptomatic Vitals:   05/06/18 0928  BP: 135/78  Pulse: (!) 57  Resp: 18  Temp: (!) 96.6 F (35.9 C)  SpO2: 97%   Filed Weights   05/06/18 0928  Weight: 167 lb 9.6 oz (76 kg)    Physical Exam Constitutional:      General: He is not in acute distress. HENT:     Head: Normocephalic and atraumatic.  Eyes:     General: No scleral icterus.    Pupils: Pupils are equal, round, and reactive to light.  Neck:     Musculoskeletal: Normal range of motion and neck supple.  Cardiovascular:     Rate and Rhythm: Normal rate and regular rhythm.     Heart sounds: Normal heart sounds.  Pulmonary:     Effort: Pulmonary effort is normal. No respiratory distress.     Breath sounds: No wheezing.  Abdominal:     General: Bowel sounds are normal. There is no distension.     Palpations: Abdomen is soft. There is no mass.     Tenderness: There is no abdominal tenderness.  Musculoskeletal: Normal range of motion.        General: No deformity.  Skin:    General: Skin is warm and dry.     Findings: No erythema or rash.  Neurological:     Mental Status: He is alert and oriented to person, place, and time.     Cranial Nerves: No cranial nerve  deficit.     Coordination: Coordination normal.  Psychiatric:        Behavior: Behavior normal.        Thought Content: Thought content normal.      LABORATORY DATA:  I have reviewed the data as listed Lab Results  Component Value Date   WBC 4.9 05/06/2018   HGB 13.3 05/06/2018   HCT 40.1 05/06/2018   MCV 95.2 05/06/2018   PLT 203 05/06/2018   Recent Labs    03/25/18 0820 04/26/18 0849 05/06/18 0913  NA 140 140 142  K 3.7 3.9 4.2  CL 105 108 111  CO2 _0 GLUCOSE 87 102* 112*  BUN _1 CREATININE 0.91 0.87 0.92  CALCIUM 9.0 8.8* 8.8*  GFRNONAA >60 >60 >60  GFRAA >60 >60 >60  PROT  --  6.7 6.5  ALBUMIN  --  3.8 3.6  AST  --  20 19  ALT  --  17 17  ALKPHOS  --  147* 126  BILITOT  --  0.9 0.5   Iron/TIBC/Ferritin/ %Sat No results found for: IRON, TIBC, FERRITIN, IRONPCTSAT   RADIOGRAPHIC STUDIES: I have personally reviewed the radiological images as listed and agreed with the findings in the report. 03/11/2018 CT chest lung screen 1. New 2.4 cm right lower lobe nodule with a new necrotic  appearing subcarinal lymph node. Lung-RADS 4B, suspicious. Additional imaging evaluation or consultation with Pulmonology or Thoracic Surgery recommended. These results will be called to the ordering clinician or representative by the Radiologist Assistant, and communication documented in the PACS or zVision Dashboard. 2. Aortic atherosclerosis (ICD10-170.0). Coronary arterycalcification.3.  Emphysema (ICD10-J43.9). 03/13/2018 PET scan # Hypermetabolic right lower lobe pulmonary nodule, consistent with primary bronchogenic carcinoma. Fluid density subcarinal structure demonstrates no significant hypermetabolism. Although this could represent an incidental benign lesion such as a bronchogenic cyst, given interval development since 02/24/2017, necrotic adenopathy cannot be excluded and tissue sampling should be considered.  MRI brain 04/01/2018  Normal MRI head with and  without contrast for age.  No intracranial metastasis.   ASSESSMENT & PLAN:  1. Malignant neoplasm of lower lobe of right lung ([place])   2. Encounter for antineoplastic chemotherapy   3. Non-intractable vomiting with nausea, unspecified vomiting type    Labs reviewed and discussed with patient. Counts acceptable to proceed with today's carboplatin AUC of 2, Taxol 45 mg/m. #Nausea, due to chemotherapy I discussed antiemetic Zofran and Compazine instructions with patient and he appreciate explanation.  Continue home antiemetics as instructed.  We spent sufficient time to discuss many aspect of care, questions were answered to patient's satisfaction. All questions were answered. The patient knows to call the clinic with any problems questions or concerns.   Return of visit: 1 week Total face to face encounter time for this patient visit was 25 min. >50% of the time was  spent in counseling and coordination of care.   Earlie Server, MD, PhD Hematology Oncology [facility] Pager- [phone] 05/06/18

## 2018-05-06 NOTE — Progress Notes (Signed)
Patient here for follow up. Pt states he had nausea after first treatment but took medication and it helped.

## 2018-05-07 ENCOUNTER — Ambulatory Visit
Admission: RE | Admit: 2018-05-07 | Discharge: 2018-05-07 | Disposition: A | Discharge status: Home / Self Care | Payer: BLUE CROSS/BLUE SHIELD | Source: Ambulatory Visit | Attending: Radiation Oncology | Admitting: Radiation Oncology

## 2018-05-07 DIAGNOSIS — Z510 Encounter for antineoplastic radiation therapy: Secondary | ICD-10-CM | POA: Diagnosis not present

## 2018-05-08 ENCOUNTER — Ambulatory Visit
Admission: RE | Admit: 2018-05-08 | Discharge: 2018-05-08 | Disposition: A | Discharge status: Home / Self Care | Payer: BLUE CROSS/BLUE SHIELD | Source: Ambulatory Visit | Attending: Radiation Oncology | Admitting: Radiation Oncology

## 2018-05-08 DIAGNOSIS — Z510 Encounter for antineoplastic radiation therapy: Secondary | ICD-10-CM | POA: Diagnosis not present

## 2018-05-09 ENCOUNTER — Ambulatory Visit
Admission: RE | Admit: 2018-05-09 | Discharge: 2018-05-09 | Disposition: A | Discharge status: Home / Self Care | Payer: BLUE CROSS/BLUE SHIELD | Source: Ambulatory Visit | Attending: Radiation Oncology | Admitting: Radiation Oncology

## 2018-05-09 DIAGNOSIS — Z510 Encounter for antineoplastic radiation therapy: Secondary | ICD-10-CM | POA: Diagnosis not present

## 2018-05-10 ENCOUNTER — Ambulatory Visit
Admission: RE | Admit: 2018-05-10 | Discharge: 2018-05-10 | Disposition: A | Discharge status: Home / Self Care | Payer: BLUE CROSS/BLUE SHIELD | Source: Ambulatory Visit | Attending: Radiation Oncology | Admitting: Radiation Oncology

## 2018-05-10 DIAGNOSIS — Z510 Encounter for antineoplastic radiation therapy: Secondary | ICD-10-CM | POA: Diagnosis not present

## 2018-05-13 ENCOUNTER — Inpatient Hospital Stay: Payer: BLUE CROSS/BLUE SHIELD

## 2018-05-13 ENCOUNTER — Ambulatory Visit
Admission: RE | Admit: 2018-05-13 | Discharge: 2018-05-13 | Disposition: A | Discharge status: Home / Self Care | Payer: BLUE CROSS/BLUE SHIELD | Source: Ambulatory Visit | Attending: Radiation Oncology | Admitting: Radiation Oncology

## 2018-05-13 ENCOUNTER — Other Ambulatory Visit: Payer: Self-pay

## 2018-05-13 ENCOUNTER — Encounter: Payer: Self-pay | Admitting: Oncology

## 2018-05-13 ENCOUNTER — Inpatient Hospital Stay (HOSPITAL_BASED_OUTPATIENT_CLINIC_OR_DEPARTMENT_OTHER): Payer: BLUE CROSS/BLUE SHIELD | Admitting: Oncology

## 2018-05-13 VITALS — BP 132/85 | HR 69 | Temp 96.2°F | Wt 161.6 lb

## 2018-05-13 DIAGNOSIS — D6481 Anemia due to antineoplastic chemotherapy: Secondary | ICD-10-CM

## 2018-05-13 DIAGNOSIS — K209 Esophagitis, unspecified without bleeding: Secondary | ICD-10-CM

## 2018-05-13 DIAGNOSIS — Z5111 Encounter for antineoplastic chemotherapy: Secondary | ICD-10-CM

## 2018-05-13 DIAGNOSIS — C3431 Malignant neoplasm of lower lobe, right bronchus or lung: Secondary | ICD-10-CM | POA: Diagnosis not present

## 2018-05-13 DIAGNOSIS — Z510 Encounter for antineoplastic radiation therapy: Secondary | ICD-10-CM | POA: Diagnosis not present

## 2018-05-13 DIAGNOSIS — T451X5A Adverse effect of antineoplastic and immunosuppressive drugs, initial encounter: Secondary | ICD-10-CM

## 2018-05-13 DIAGNOSIS — Z87891 Personal history of nicotine dependence: Secondary | ICD-10-CM

## 2018-05-13 LAB — CBC WITH DIFFERENTIAL/PLATELET
Abs Immature Granulocytes: 0.06 10*3/uL (ref 0.00–0.07)
Basophils Absolute: 0 10*3/uL (ref 0.0–0.1)
Basophils Relative: 1 %
EOS ABS: 0.1 10*3/uL (ref 0.0–0.5)
Eosinophils Relative: 1 %
HCT: 38.6 % — ABNORMAL LOW (ref 39.0–52.0)
Hemoglobin: 12.8 g/dL — ABNORMAL LOW (ref 13.0–17.0)
Immature Granulocytes: 2 %
Lymphocytes Relative: 21 %
Lymphs Abs: 0.8 10*3/uL (ref 0.7–4.0)
MCH: 31.5 pg (ref 26.0–34.0)
MCHC: 33.2 g/dL (ref 30.0–36.0)
MCV: 95.1 fL (ref 80.0–100.0)
Monocytes Absolute: 0.3 10*3/uL (ref 0.1–1.0)
Monocytes Relative: 9 %
Neutro Abs: 2.3 10*3/uL (ref 1.7–7.7)
Neutrophils Relative %: 66 %
Platelets: 168 10*3/uL (ref 150–400)
RBC: 4.06 MIL/uL — ABNORMAL LOW (ref 4.22–5.81)
RDW: 13 % (ref 11.5–15.5)
WBC: 3.5 10*3/uL — ABNORMAL LOW (ref 4.0–10.5)
nRBC: 0 % (ref 0.0–0.2)

## 2018-05-13 LAB — COMPREHENSIVE METABOLIC PANEL
ALK PHOS: 109 U/L (ref 38–126)
ALT: 20 U/L (ref 0–44)
ANION GAP: 5 (ref 5–15)
AST: 19 U/L (ref 15–41)
Albumin: 3.6 g/dL (ref 3.5–5.0)
BUN: 14 mg/dL (ref 8–23)
CALCIUM: 8.4 mg/dL — AB (ref 8.9–10.3)
CO2: 26 mmol/L (ref 22–32)
Chloride: 108 mmol/L (ref 98–111)
Creatinine, Ser: 0.84 mg/dL (ref 0.61–1.24)
GFR calc Af Amer: >60 mL/min (ref >60)
GFR calc non Af Amer: >60 mL/min (ref >60)
Glucose, Bld: 100 mg/dL — ABNORMAL HIGH (ref 70–99)
Potassium: 3.9 mmol/L (ref 3.5–5.1)
Sodium: 139 mmol/L (ref 135–145)
Total Bilirubin: 0.4 mg/dL (ref 0.3–1.2)
Total Protein: 6.2 g/dL — ABNORMAL LOW (ref 6.5–8.1)

## 2018-05-13 MED ORDER — FAMOTIDINE IN NACL 20-0.9 MG/50ML-% IV SOLN
20.0000 mg | Freq: Once | INTRAVENOUS | Status: AC
  Administered 2018-05-13: 20 mg via INTRAVENOUS
  Filled 2018-05-13: qty 50

## 2018-05-13 MED ORDER — SODIUM CHLORIDE 0.9% FLUSH
10.0000 mL | INTRAVENOUS | Status: DC | PRN
  Administered 2018-05-13: 10 mL via INTRAVENOUS
  Filled 2018-05-13: qty 10

## 2018-05-13 MED ORDER — PALONOSETRON HCL INJECTION 0.25 MG/5ML
0.2500 mg | Freq: Once | INTRAVENOUS | Status: AC
  Administered 2018-05-13: 0.25 mg via INTRAVENOUS
  Filled 2018-05-13: qty 5

## 2018-05-13 MED ORDER — SODIUM CHLORIDE 0.9 % IV SOLN
Freq: Once | INTRAVENOUS | Status: AC
  Administered 2018-05-13: 11:00:00 via INTRAVENOUS
  Filled 2018-05-13: qty 250

## 2018-05-13 MED ORDER — SODIUM CHLORIDE 0.9 % IV SOLN
45.0000 mg/m2 | Freq: Once | INTRAVENOUS | Status: AC
  Administered 2018-05-13: 84 mg via INTRAVENOUS
  Filled 2018-05-13: qty 14

## 2018-05-13 MED ORDER — SODIUM CHLORIDE 0.9 % IV SOLN
20.0000 mg | Freq: Once | INTRAVENOUS | Status: AC
  Administered 2018-05-13: 20 mg via INTRAVENOUS
  Filled 2018-05-13: qty 2

## 2018-05-13 MED ORDER — HEPARIN SOD (PORK) LOCK FLUSH 100 UNIT/ML IV SOLN
500.0000 [IU] | Freq: Once | INTRAVENOUS | Status: AC
  Administered 2018-05-13: 500 [IU] via INTRAVENOUS
  Filled 2018-05-13: qty 5

## 2018-05-13 MED ORDER — SODIUM CHLORIDE 0.9 % IV SOLN
230.0000 mg | Freq: Once | INTRAVENOUS | Status: AC
  Administered 2018-05-13: 230 mg via INTRAVENOUS
  Filled 2018-05-13: qty 23

## 2018-05-13 MED ORDER — DIPHENHYDRAMINE HCL 50 MG/ML IJ SOLN
50.0000 mg | Freq: Once | INTRAMUSCULAR | Status: AC
  Administered 2018-05-13: 50 mg via INTRAVENOUS
  Filled 2018-05-13: qty 1

## 2018-05-13 NOTE — Progress Notes (Signed)
Hematology/Oncology follow up note [facility] Telephone:([phone] [phone]   Patient Care Team: Baxter Hire, MD as PCP - General (Internal Medicine) Sherren Mocha, MD as PCP - Cardiology (Cardiology) Telford Nab, RN as Registered Nurse  REFERRING PROVIDER: Dr.Fleming REASON FOR VISIT:  Follow-up for management of stage III non-small cell lung cancer   HISTORY OF PRESENTING ILLNESS:  Timothy Yoder is a  63 y.o.  male with PMH listed below who was referred to me for evaluation of lung mass.  Patient is a former smoker quit smoking 7 years ago.  40-pack-year. Patient chest lung cancer screening on 03/11/2018. CT scan showed 2.4 cm right lower lobe nodule with a new necrotic.  Subcarinal lymph node. PET scan showed hypermetabolic right lower pulmonary nodule, consistent with primary bronchogenic carcinoma.  Fluid density subcarinal structure demonstrate no significant hypermetabolism.  Although this could represent an incidental benign lesion such as a cyst given the interval development since 02/24/2017, necrotic adenopathy cannot be excluded and tissue sampling should be considered.  Patient was seen by Dr. Faith Rogue for initial evaluation on 03/13/2018.  Patient has not got PET scan at that time. Today patient was accompanied by his wife to the oncology clinic to discuss management plan.  And PET scan results. Patient reports chronic shortness of breath with moderate exertion.  Appetite is good denies any fever, chills, hemoptysis, weight loss.  # underwent biopsy via bronchoscopy.  Subcarinal lymph node biopsy positive for non-small cell lung cancer, favoring poorly differentiated adenocarcinoma. #Mediport was placed by Dr. Genevive Bi   INTERVAL HISTORY Timothy Yoder is a 63 y.o. male who has above history reviewed by me today presents for follow up visit for management of stage III non-small cell lung cancer. Patient has been on concurrent chemo  and radiation. Reports tolerating well.  Mild nausea during the interval which is manageable with home antiemetics. Chronic fatigue, usually worsen a few days after chemotherapy and getting better when he is close to the next treatment. Appetite is fair.  Good days and bad days with appetite. Chronic shortness of breath at baseline. Occasionally he feels middle chest pain, intermittent.  Denies any dysphagia or odynophagia. Patient takes omeprazole 40 mg twice daily.    Review of Systems  Constitutional: Positive for fatigue. Negative for appetite change, chills, fever and unexpected weight change.  HENT:   Negative for hearing loss and voice change.   Eyes: Negative for eye problems and icterus.  Respiratory: Negative for chest tightness, cough and shortness of breath.   Cardiovascular: Negative for chest pain and leg swelling.  Gastrointestinal: Negative for abdominal distention, abdominal pain and blood in stool.  Endocrine: Negative for hot flashes.  Genitourinary: Negative for difficulty urinating, dysuria and frequency.   Musculoskeletal: Negative for arthralgias.  Skin: Negative for itching and rash.  Neurological: Negative for extremity weakness, light-headedness and numbness.  Hematological: Negative for adenopathy. Does not bruise/bleed easily.  Psychiatric/Behavioral: Negative for confusion.   MEDICAL HISTORY:  Past Medical History:  Diagnosis Date  . BPH (benign prostatic hyperplasia)   . CAD (coronary artery disease)    a. 04/2014 anterolateral STEMI/PCI: LM nl, LAD 24m(2.75x16 Promus DES, D1 50p, LCX nl, RI nl, OM1 100 small/chronic, RCA dominant, [id number](FFR 0.90->Med Rx), RPDA 50, RPLA 70, EF 60%.  . CMarland KitchenPD (chronic obstructive pulmonary disease) (HCC)    Dr FleRaul Del Former tobacco use   . Gastroesophageal reflux disease   . HTN (hypertension)   .  Hyperlipidemia   . Hypokalemia   . Malignant neoplasm of lung ([place]) 04/01/2018  . Myocardial infarction ([place])  04/2014   Dr Sherren Mocha  . Stented coronary artery     SURGICAL HISTORY: Past Surgical History:  Procedure Laterality Date  . CARDIAC CATHETERIZATION    . CHOLECYSTECTOMY N/A 02/15/2016   Procedure: LAPAROSCOPIC CHOLECYSTECTOMY WITH INTRAOPERATIVE CHOLANGIOGRAM;  Surgeon: Leonie Green, MD;  Location: [facility] ORS;  Service: General;  Laterality: N/A;  . COLONOSCOPY    . COLONOSCOPY WITH PROPOFOL N/A 08/02/2015   Procedure: COLONOSCOPY WITH PROPOFOL;  Surgeon: Manya Silvas, MD;  Location: [facility] ENDOSCOPY;  Service: Endoscopy;  Laterality: N/A;  . CORONARY ANGIOPLASTY  04/2014   STENT  . ENDOBRONCHIAL ULTRASOUND N/A 03/26/2018   Procedure: ENDOBRONCHIAL ULTRASOUND;  Surgeon: Flora Lipps, MD;  Location: [facility] ORS;  Service: Cardiopulmonary;  Laterality: N/A;  . ESOPHAGOGASTRODUODENOSCOPY (EGD) WITH PROPOFOL N/A 08/02/2015   Procedure: ESOPHAGOGASTRODUODENOSCOPY (EGD) WITH PROPOFOL;  Surgeon: Manya Silvas, MD;  Location: [facility] ENDOSCOPY;  Service: Endoscopy;  Laterality: N/A;  . FLEXIBLE BRONCHOSCOPY N/A 03/26/2018   Procedure: FLEXIBLE BRONCHOSCOPY;  Surgeon: Flora Lipps, MD;  Location: [facility] ORS;  Service: Cardiopulmonary;  Laterality: N/A;  . KNEE ARTHROSCOPY Left   . LEFT HEART CATHETERIZATION WITH CORONARY ANGIOGRAM N/A 05/03/2014   Procedure: LEFT HEART CATHETERIZATION WITH CORONARY ANGIOGRAM;  Surgeon: Blane Ohara, MD;  Location: [facility] CATH LAB;  Service: Cardiovascular;  Laterality: N/A;  . PERCUTANEOUS CORONARY STENT INTERVENTION (PCI-S) N/A 05/05/2014   Procedure: PERCUTANEOUS CORONARY STENT INTERVENTION (PCI-S);  Surgeon: Peter M Martinique, MD;  Location: [facility] CATH LAB;  Service: Cardiovascular;  Laterality: N/A;  . PORTACATH PLACEMENT Left 04/04/2018   Procedure: INSERTION PORT-A-CATH;  Surgeon: [name], MD;  Location: [facility] ORS;  Service: General;  Laterality: Left;    SOCIAL HISTORY: Social History   Socioeconomic History  . Marital status: Married    Spouse  name: Not on file  . Number of children: Not on file  . Years of education: Not on file  . Highest education level: Not on file  Occupational History  . Not on file  Social Needs  . Financial resource strain: Not on file  . Food insecurity:    Worry: Not on file    Inability: Not on file  . Transportation needs:    Medical: Not on file    Non-medical: Not on file  Tobacco Use  . Smoking status: Former Smoker    Packs/day: 1.00    Years: 40.00    Pack years: 40.00    Types: Cigarettes    Last attempt to quit: 02/14/2011    Years since quitting: 7.2  . Smokeless tobacco: Never Used  Substance and Sexual Activity  . Alcohol use: No  . Drug use: No  . Sexual activity: Not on file  Lifestyle  . Physical activity:    Days per week: Not on file    Minutes per session: Not on file  . Stress: Not on file  Relationships  . Social connections:    Talks on phone: Not on file    Gets together: Not on file    Attends religious service: Not on file    Active member of club or organization: Not on file    Attends meetings of clubs or organizations: Not on file    Relationship status: Not on file  . Intimate partner violence:    Fear of current or ex partner: Not on file    Emotionally abused:  Not on file    Physically abused: Not on file    Forced sexual activity: Not on file  Other Topics Concern  . Not on file  Social History Narrative   The patient is married. He lives in [place]. He works full-time. He quit smoking in 2013.    FAMILY HISTORY: Family History  Problem Relation Age of Onset  . CAD Mother 32       Died of an MI  . CAD Father 40       Died of a massive MI  . COPD Sister   . Lung cancer Brother   . Bone cancer Paternal Uncle   . Colon cancer Neg Hx   . Breast cancer Neg Hx     ALLERGIES:  has No Known Allergies.  MEDICATIONS:  Current Outpatient Medications  Medication Sig Dispense Refill  . acetaminophen (TYLENOL) 500 MG tablet Take 1,000 mg by  mouth every 6 (six) hours as needed (for pain.).    Marland Kitchen albuterol (PROAIR HFA) 108 (90 Base) MCG/ACT inhaler Inhale 2 puffs into the lungs every 6 (six) hours as needed for wheezing or shortness of breath.     Marland Kitchen aspirin EC 81 MG tablet Take 81 mg by mouth daily.    Marland Kitchen atorvastatin (LIPITOR) 80 MG tablet Take 1 tablet (80 mg total) by mouth daily at 6 PM. 90 tablet 3  . carvedilol (COREG) 6.25 MG tablet Take 1 tablet (6.25 mg total) by mouth 2 (two) times daily. 180 tablet 3  . lidocaine-prilocaine (EMLA) cream Apply to affected area once 30 g 3  . lisinopril (PRINIVIL,ZESTRIL) 10 MG tablet Take 1 tablet (10 mg total) by mouth daily. 90 tablet 3  . nitroGLYCERIN (NITROSTAT) 0.4 MG SL tablet Place 1 tablet (0.4 mg total) under the tongue every 5 (five) minutes as needed for chest pain. X 3 doses 25 tablet 3  . omeprazole (PRILOSEC) 40 MG capsule Take 40 mg by mouth 2 (two) times daily.     . ondansetron (ZOFRAN) 8 MG tablet Take 1 tablet (8 mg total) by mouth 2 (two) times daily as needed for refractory nausea / vomiting. Start on day 3 after chemo. 30 tablet 1  . potassium chloride SA (KLOR-CON M20) 20 MEQ tablet Take 1 tablet (20 mEq total) by mouth daily. 90 tablet 1  . prochlorperazine (COMPAZINE) 10 MG tablet Take 1 tablet (10 mg total) by mouth every 6 (six) hours as needed (Nausea or vomiting). 30 tablet 1  . tamsulosin (FLOMAX) 0.4 MG CAPS capsule Take 0.4 mg by mouth daily after supper.    . tobramycin-dexamethasone (TOBRADEX) ophthalmic solution INSTILL 2 DROPS IN BOTH EYES EVERY 6 HOURS    . traZODone (DESYREL) 50 MG tablet Take 1 tablet (50 mg total) by mouth at bedtime. 30 tablet 0  . umeclidinium-vilanterol (ANORO ELLIPTA) 62.5-25 MCG/INH AEPB Inhale 1 puff into the lungs every morning.    . colestipol (COLESTID) 1 g tablet Take 2 g by mouth daily as needed (stomach issues).      No current facility-administered medications for this visit.    Facility-Administered Medications Ordered in  Other Visits  Medication Dose Route Frequency Provider Last Rate Last Dose  . CARBOplatin (PARAPLATIN) 230 mg in sodium chloride 0.9 % 250 mL chemo infusion  230 mg Intravenous Once Earlie Server, MD      . heparin lock flush 100 unit/mL  500 Units Intravenous Once Earlie Server, MD      . PACLitaxel (TAXOL) [phone]  mg in sodium chloride 0.9 % 250 mL chemo infusion (</= 70m/m2)  45 mg/m2 (Treatment Plan Recorded) Intravenous Once YEarlie Server MD 264 mL/hr at 05/13/18 1130 84 mg at 05/13/18 1130  . sodium chloride flush (NS) 0.9 % injection 10 mL  10 mL Intravenous PRN YEarlie Server MD   10 mL at 05/13/18 0845     PHYSICAL EXAMINATION: ECOG PERFORMANCE STATUS: 0 - Asymptomatic Vitals:   05/13/18 0931  BP: 132/85  Pulse: 69  Temp: (!) 96.2 F (35.7 C)  SpO2: 96%   Filed Weights   05/13/18 0931  Weight: 161 lb 9.6 oz (73.3 kg)    Physical Exam Constitutional:      General: He is not in acute distress. HENT:     Head: Normocephalic and atraumatic.  Eyes:     General: No scleral icterus.    Pupils: Pupils are equal, round, and reactive to light.  Neck:     Musculoskeletal: Normal range of motion and neck supple.  Cardiovascular:     Rate and Rhythm: Normal rate and regular rhythm.     Heart sounds: Normal heart sounds.  Pulmonary:     Effort: Pulmonary effort is normal. No respiratory distress.     Breath sounds: No wheezing.  Abdominal:     General: Bowel sounds are normal. There is no distension.     Palpations: Abdomen is soft. There is no mass.     Tenderness: There is no abdominal tenderness.  Musculoskeletal: Normal range of motion.        General: No deformity.  Skin:    General: Skin is warm and dry.     Findings: No erythema or rash.  Neurological:     Mental Status: He is alert and oriented to person, place, and time.     Cranial Nerves: No cranial nerve deficit.     Coordination: Coordination normal.  Psychiatric:        Behavior: Behavior normal.        Thought Content:  Thought content normal.      LABORATORY DATA:  I have reviewed the data as listed Lab Results  Component Value Date   WBC 3.5 (L) 05/13/2018   HGB 12.8 (L) 05/13/2018   HCT 38.6 (L) 05/13/2018   MCV 95.1 05/13/2018   PLT 168 05/13/2018   Recent Labs    04/26/18 0849 05/06/18 0913 05/13/18 0845  NA 140 142 139  K 3.9 4.2 3.9  CL 108 111 108  CO2 _0 GLUCOSE 102* 112* 100*  BUN _1 CREATININE 0.87 0.92 0.84  CALCIUM 8.8* 8.8* 8.4*  GFRNONAA >60 >60 >60  GFRAA >60 >60 >60  PROT 6.7 6.5 6.2*  ALBUMIN 3.8 3.6 3.6  AST _2 ALT _3 ALKPHOS 147* 126 109  BILITOT 0.9 0.5 0.4   Iron/TIBC/Ferritin/ %Sat No results found for: IRON, TIBC, FERRITIN, IRONPCTSAT   RADIOGRAPHIC STUDIES: I have personally reviewed the radiological images as listed and agreed with the findings in the report. 03/11/2018 CT chest lung screen 1. New 2.4 cm right lower lobe nodule with a new necrotic appearing subcarinal lymph node. Lung-RADS 4B, suspicious. Additional imaging evaluation or consultation with Pulmonology or Thoracic Surgery recommended. These results will be called to the ordering clinician or representative by the Radiologist Assistant, and communication documented in the PACS or zVision Dashboard. 2. Aortic atherosclerosis (ICD10-170.0). Coronary arterycalcification.3.  Emphysema (ICD10-J43.9). 03/13/2018 PET scan # Hypermetabolic right lower lobe  pulmonary nodule, consistent with primary bronchogenic carcinoma. Fluid density subcarinal structure demonstrates no significant hypermetabolism. Although this could represent an incidental benign lesion such as a bronchogenic cyst, given interval development since 02/24/2017, necrotic adenopathy cannot be excluded and tissue sampling should be considered.  03/26/2018 DIAGNOSIS:  A. SUBCARINA, CYSTIC MASS; FNA:  - NON-SMALL CELL CARCINOMA CONSISTENT WITH POORLY DIFFERENTIATED  ADENOCARCINOMA.   MRI brain 04/01/2018    Normal MRI head with and without contrast for age.  No intracranial metastasis.   ASSESSMENT & PLAN:  1. Malignant neoplasm of lower lobe of right lung ([place])   2. Encounter for antineoplastic chemotherapy   3. Anemia due to antineoplastic chemotherapy   4. Esophagitis   Cancer Staging Malignant neoplasm of lower lobe of right lung [facility]) Staging form: Lung, AJCC 8th Edition - Clinical stage from 05/13/2018: Stage IIIA (cT1c, cN2, cM0) - Signed by Earlie Server, MD on 05/13/2018  #Stage IIIa lung adenocarcinoma On concurrent chemo and radiation.  Labs reviewed and discussed with patient. Counts are acceptable to proceed with today's carboplatin AUC of 2 and Taxol 36m/m.  #Mild anemia secondary to concurrent chemo and radiation.  Close monitoring. # Intermittent mid chest pain, acid reflux symptoms, likely secondary to radiation induced esophagitis.  We had a discussion about avoiding spicy and irritant food.  Continue take omeprazole 40 mg twice daily.    All questions were answered. The patient knows to call the clinic with any problems questions or concerns.   Return of visit: 1 week for evaluation prior to next chemotherapy. Total face to face encounter time for this patient visit was 25 min. >50% of the time was  spent in counseling and coordination of care.   ZEarlie Server MD, PhD Hematology Oncology [facility] For OrthopaedicsPager- [phone]/20/20

## 2018-05-13 NOTE — Progress Notes (Signed)
Patient here for follow up. He states he has good and bad days with his appetite.

## 2018-05-14 ENCOUNTER — Ambulatory Visit
Admission: RE | Admit: 2018-05-14 | Discharge: 2018-05-14 | Disposition: A | Discharge status: Home / Self Care | Payer: BLUE CROSS/BLUE SHIELD | Source: Ambulatory Visit | Attending: Radiation Oncology | Admitting: Radiation Oncology

## 2018-05-14 DIAGNOSIS — Z510 Encounter for antineoplastic radiation therapy: Secondary | ICD-10-CM | POA: Diagnosis not present

## 2018-05-15 ENCOUNTER — Ambulatory Visit
Admission: RE | Admit: 2018-05-15 | Discharge: 2018-05-15 | Disposition: A | Discharge status: Home / Self Care | Payer: BLUE CROSS/BLUE SHIELD | Source: Ambulatory Visit | Attending: Radiation Oncology | Admitting: Radiation Oncology

## 2018-05-15 DIAGNOSIS — Z510 Encounter for antineoplastic radiation therapy: Secondary | ICD-10-CM | POA: Diagnosis not present

## 2018-05-16 ENCOUNTER — Ambulatory Visit
Admission: RE | Admit: 2018-05-16 | Discharge: 2018-05-16 | Disposition: A | Discharge status: Home / Self Care | Payer: BLUE CROSS/BLUE SHIELD | Source: Ambulatory Visit | Attending: Radiation Oncology | Admitting: Radiation Oncology

## 2018-05-16 DIAGNOSIS — Z510 Encounter for antineoplastic radiation therapy: Secondary | ICD-10-CM | POA: Diagnosis not present

## 2018-05-17 ENCOUNTER — Ambulatory Visit
Admission: RE | Admit: 2018-05-17 | Discharge: 2018-05-17 | Disposition: A | Discharge status: Home / Self Care | Payer: BLUE CROSS/BLUE SHIELD | Source: Ambulatory Visit | Attending: Radiation Oncology | Admitting: Radiation Oncology

## 2018-05-17 DIAGNOSIS — Z510 Encounter for antineoplastic radiation therapy: Secondary | ICD-10-CM | POA: Diagnosis not present

## 2018-05-20 ENCOUNTER — Ambulatory Visit
Admission: RE | Admit: 2018-05-20 | Discharge: 2018-05-20 | Disposition: A | Discharge status: Home / Self Care | Payer: BLUE CROSS/BLUE SHIELD | Source: Ambulatory Visit | Attending: Radiation Oncology | Admitting: Radiation Oncology

## 2018-05-20 ENCOUNTER — Inpatient Hospital Stay: Payer: BLUE CROSS/BLUE SHIELD

## 2018-05-20 ENCOUNTER — Encounter: Payer: Self-pay | Admitting: Oncology

## 2018-05-20 ENCOUNTER — Other Ambulatory Visit: Payer: Self-pay

## 2018-05-20 ENCOUNTER — Inpatient Hospital Stay (HOSPITAL_BASED_OUTPATIENT_CLINIC_OR_DEPARTMENT_OTHER): Payer: BLUE CROSS/BLUE SHIELD | Admitting: Oncology

## 2018-05-20 VITALS — BP 114/74 | HR 73 | Temp 96.9°F | Resp 18 | Wt 155.3 lb

## 2018-05-20 DIAGNOSIS — Z510 Encounter for antineoplastic radiation therapy: Secondary | ICD-10-CM | POA: Diagnosis not present

## 2018-05-20 DIAGNOSIS — K210 Gastro-esophageal reflux disease with esophagitis: Secondary | ICD-10-CM

## 2018-05-20 DIAGNOSIS — K209 Esophagitis, unspecified without bleeding: Secondary | ICD-10-CM

## 2018-05-20 DIAGNOSIS — Z5111 Encounter for antineoplastic chemotherapy: Secondary | ICD-10-CM

## 2018-05-20 DIAGNOSIS — C3431 Malignant neoplasm of lower lobe, right bronchus or lung: Secondary | ICD-10-CM | POA: Diagnosis not present

## 2018-05-20 DIAGNOSIS — T451X5A Adverse effect of antineoplastic and immunosuppressive drugs, initial encounter: Secondary | ICD-10-CM

## 2018-05-20 DIAGNOSIS — D6481 Anemia due to antineoplastic chemotherapy: Secondary | ICD-10-CM | POA: Diagnosis not present

## 2018-05-20 DIAGNOSIS — R197 Diarrhea, unspecified: Secondary | ICD-10-CM

## 2018-05-20 LAB — CBC WITH DIFFERENTIAL/PLATELET
Abs Immature Granulocytes: 0.07 10*3/uL (ref 0.00–0.07)
Basophils Absolute: 0 10*3/uL (ref 0.0–0.1)
Basophils Relative: 1 %
Eosinophils Absolute: 0 10*3/uL (ref 0.0–0.5)
Eosinophils Relative: 1 %
HCT: 39.7 % (ref 39.0–52.0)
Hemoglobin: 13.4 g/dL (ref 13.0–17.0)
Immature Granulocytes: 2 %
Lymphocytes Relative: 15 %
Lymphs Abs: 0.7 10*3/uL (ref 0.7–4.0)
MCH: 31.8 pg (ref 26.0–34.0)
MCHC: 33.8 g/dL (ref 30.0–36.0)
MCV: 94.1 fL (ref 80.0–100.0)
Monocytes Absolute: 0.4 10*3/uL (ref 0.1–1.0)
Monocytes Relative: 9 %
NEUTROS PCT: 72 %
Neutro Abs: 3.4 10*3/uL (ref 1.7–7.7)
Platelets: 181 10*3/uL (ref 150–400)
RBC: 4.22 MIL/uL (ref 4.22–5.81)
RDW: 13.1 % (ref 11.5–15.5)
WBC: 4.7 10*3/uL (ref 4.0–10.5)
nRBC: 0 % (ref 0.0–0.2)

## 2018-05-20 LAB — COMPREHENSIVE METABOLIC PANEL
ALT: 29 U/L (ref 0–44)
AST: 25 U/L (ref 15–41)
Albumin: 3.7 g/dL (ref 3.5–5.0)
Alkaline Phosphatase: 101 U/L (ref 38–126)
Anion gap: 5 (ref 5–15)
BILIRUBIN TOTAL: 0.6 mg/dL (ref 0.3–1.2)
BUN: 12 mg/dL (ref 8–23)
CALCIUM: 8.7 mg/dL — AB (ref 8.9–10.3)
CO2: 25 mmol/L (ref 22–32)
Chloride: 108 mmol/L (ref 98–111)
Creatinine, Ser: 0.92 mg/dL (ref 0.61–1.24)
GFR calc Af Amer: >60 mL/min (ref >60)
Glucose, Bld: 82 mg/dL (ref 70–99)
Potassium: 3.6 mmol/L (ref 3.5–5.1)
Sodium: 138 mmol/L (ref 135–145)
Total Protein: 6.4 g/dL — ABNORMAL LOW (ref 6.5–8.1)

## 2018-05-20 MED ORDER — SODIUM CHLORIDE 0.9 % IV SOLN
20.0000 mg | Freq: Once | INTRAVENOUS | Status: AC
  Administered 2018-05-20: 20 mg via INTRAVENOUS
  Filled 2018-05-20: qty 2

## 2018-05-20 MED ORDER — SODIUM CHLORIDE 0.9 % IV SOLN
45.0000 mg/m2 | Freq: Once | INTRAVENOUS | Status: AC
  Administered 2018-05-20: 84 mg via INTRAVENOUS
  Filled 2018-05-20: qty 14

## 2018-05-20 MED ORDER — SODIUM CHLORIDE 0.9 % IV SOLN
Freq: Once | INTRAVENOUS | Status: AC
  Administered 2018-05-20: 09:00:00 via INTRAVENOUS
  Filled 2018-05-20: qty 250

## 2018-05-20 MED ORDER — SODIUM CHLORIDE 0.9% FLUSH
10.0000 mL | Freq: Once | INTRAVENOUS | Status: AC
  Administered 2018-05-20: 10 mL via INTRAVENOUS
  Filled 2018-05-20: qty 10

## 2018-05-20 MED ORDER — FAMOTIDINE IN NACL 20-0.9 MG/50ML-% IV SOLN
20.0000 mg | Freq: Once | INTRAVENOUS | Status: AC
  Administered 2018-05-20: 20 mg via INTRAVENOUS
  Filled 2018-05-20: qty 50

## 2018-05-20 MED ORDER — HEPARIN SOD (PORK) LOCK FLUSH 100 UNIT/ML IV SOLN
500.0000 [IU] | Freq: Once | INTRAVENOUS | Status: AC
  Administered 2018-05-20: 500 [IU] via INTRAVENOUS
  Filled 2018-05-20: qty 5

## 2018-05-20 MED ORDER — PALONOSETRON HCL INJECTION 0.25 MG/5ML
0.2500 mg | Freq: Once | INTRAVENOUS | Status: AC
  Administered 2018-05-20: 0.25 mg via INTRAVENOUS
  Filled 2018-05-20: qty 5

## 2018-05-20 MED ORDER — TRAZODONE HCL 50 MG PO TABS: 50.0000 mg | ORAL_TABLET | Freq: Every day | ORAL | 2 refills | Status: DC

## 2018-05-20 MED ORDER — SODIUM CHLORIDE 0.9 % IV SOLN
230.0000 mg | Freq: Once | INTRAVENOUS | Status: AC
  Administered 2018-05-20: 230 mg via INTRAVENOUS
  Filled 2018-05-20: qty 23

## 2018-05-20 MED ORDER — DIPHENHYDRAMINE HCL 50 MG/ML IJ SOLN
50.0000 mg | Freq: Once | INTRAMUSCULAR | Status: AC
  Administered 2018-05-20: 50 mg via INTRAVENOUS
  Filled 2018-05-20: qty 1

## 2018-05-20 NOTE — Progress Notes (Signed)
Hematology/Oncology follow up note [facility] [phone] [phone]   Patient Care Team: Baxter Hire, MD as PCP - General (Internal Medicine) Sherren Mocha, MD as PCP - Cardiology (Cardiology) Telford Nab, RN as Registered Nurse  REFERRING PROVIDER: Dr.Fleming REASON FOR VISIT:  Follow-up for management of stage III non-small cell lung cancer   HISTORY OF PRESENTING ILLNESS:  Timothy Yoder is a  63 y.o.  male with PMH listed below who was referred to me for evaluation of lung mass.  Patient is a former smoker quit smoking 7 years ago.  40-pack-year. Patient chest lung cancer screening on 03/11/2018. CT scan showed 2.4 cm right lower lobe nodule with a new necrotic.  Subcarinal lymph node. PET scan showed hypermetabolic right lower pulmonary nodule, consistent with primary bronchogenic carcinoma.  Fluid density subcarinal structure demonstrate no significant hypermetabolism.  Although this could represent an incidental benign lesion such as a cyst given the interval development since 02/24/2017, necrotic adenopathy cannot be excluded and tissue sampling should be considered.  Patient was seen by Dr. Faith Rogue for initial evaluation on 03/13/2018.  Patient has not got PET scan at that time. Today patient was accompanied by his wife to the oncology clinic to discuss management plan.  And PET scan results. Patient reports chronic shortness of breath with moderate exertion.  Appetite is good denies any fever, chills, hemoptysis, weight loss.  # underwent biopsy via bronchoscopy.  Subcarinal lymph node biopsy positive for non-small cell lung cancer, favoring poorly differentiated adenocarcinoma. #Mediport was placed by Dr. Genevive Bi   INTERVAL HISTORY Timothy Yoder is a 63 y.o. male who has above history reviewed by me today presents for follow up visit for management of stage III non-small cell lung cancer.  Patient has been on concurrent chemo  and radiation.  Reports tolerating well. He has had constipation and took a dose of Dulcolax.  Yesterday he had multiple episodes of loose stools. Today he has not had any loose bowel movement so far .  Denies any abdominal pain, fever, nausea vomiting. Appetite is fair.  There are good days and bad days with appetites. Chronic shortness of breath at baseline. 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    Review of Systems  Constitutional: Positive for fatigue. Negative for appetite change, chills, fever and unexpected weight change.  HENT:   Negative for hearing loss and voice change.   Eyes: Negative for eye problems and icterus.  Respiratory: Negative for chest tightness, cough and shortness of breath.   Cardiovascular: Negative for chest pain and leg swelling.  Gastrointestinal: Positive for constipation and diarrhea. Negative for abdominal distention, abdominal pain and blood in stool.  Endocrine: Negative for hot flashes.  Genitourinary: Negative for difficulty urinating, dysuria and frequency.   Musculoskeletal: Negative for arthralgias.  Skin: Negative for itching and rash.  Neurological: Negative for extremity weakness, light-headedness and numbness.  Hematological: Negative for adenopathy. Does not bruise/bleed easily.  Psychiatric/Behavioral: Negative for confusion.   MEDICAL HISTORY:  Past Medical History:  Diagnosis Date  . BPH (benign prostatic hyperplasia)   . CAD (coronary artery disease)    a. 04/2014 anterolateral STEMI/PCI: LM nl, LAD 36m(2.75x16 Promus DES, D1 50p, LCX nl, RI nl, OM1 100 small/chronic, RCA dominant, 20-365m7069m(FFR 0.90->Med Rx), RPDA 50, RPLA 70, EF 60%.  . CMarland KitchenPD (chronic obstructive pulmonary disease) (HCC)    Dr FleRaul Del Former tobacco use   . Gastroesophageal reflux disease   .  HTN (hypertension)   . Hyperlipidemia   . Hypokalemia   . Malignant neoplasm of lung ([place]) 04/01/2018  . Myocardial infarction  ([place]) 04/2014   Dr Sherren Mocha  . Stented coronary artery     SURGICAL HISTORY: Past Surgical History:  Procedure Laterality Date  . CARDIAC CATHETERIZATION    . CHOLECYSTECTOMY N/A 02/15/2016   Procedure: LAPAROSCOPIC CHOLECYSTECTOMY WITH INTRAOPERATIVE CHOLANGIOGRAM;  Surgeon: Leonie Green, MD;  Location: [facility] ORS;  Service: General;  Laterality: N/A;  . COLONOSCOPY    . COLONOSCOPY WITH PROPOFOL N/A 08/02/2015   Procedure: COLONOSCOPY WITH PROPOFOL;  Surgeon: Manya Silvas, MD;  Location: [facility] ENDOSCOPY;  Service: Endoscopy;  Laterality: N/A;  . CORONARY ANGIOPLASTY  04/2014   STENT  . ENDOBRONCHIAL ULTRASOUND N/A 03/26/2018   Procedure: ENDOBRONCHIAL ULTRASOUND;  Surgeon: Flora Lipps, MD;  Location: [facility] ORS;  Service: Cardiopulmonary;  Laterality: N/A;  . ESOPHAGOGASTRODUODENOSCOPY (EGD) WITH PROPOFOL N/A 08/02/2015   Procedure: ESOPHAGOGASTRODUODENOSCOPY (EGD) WITH PROPOFOL;  Surgeon: Manya Silvas, MD;  Location[facility]COPY;  Service: Endoscopy;  Laterality: N/A;  . FLEXIBLE BRONCHOSCOPY N/A 03/26/2018   Procedure: FLEXIBLE BRONCHOSCOPY;  Surgeon: Flora Lipps, MD;  Location: [facility] ORS;  Service: Cardiopulmonary;  Laterality: N/A;  . KNEE ARTHROSCOPY Left   . LEFT HEART CATHETERIZATION WITH CORONARY ANGIOGRAM N/A 05/03/2014   Procedure: LEFT HEART CATHETERIZATION WITH CORONARY ANGIOGRAM;  Surgeon: Blane Ohara, MD;  Location: [facility] CATH LAB;  Service: Cardiovascular;  Laterality: N/A;  . PERCUTANEOUS CORONARY STENT INTERVENTION (PCI-S) N/A 05/05/2014   Procedure: PERCUTANEOUS CORONARY STENT INTERVENTION (PCI-S);  Surgeon: Peter M Martinique, MD;  Location: [facility] CATH LAB;  Service: Cardiovascular;  Laterality: N/A;  . PORTACATH PLACEMENT Left 04/04/2018   Procedure: INSERTION PORT-A-CATH;  Surgeon: [name], MD;  Location: [facility] ORS;  Service: General;  Laterality: Left;    SOCIAL HISTORY: Social History   Socioeconomic History  . Marital status: Married     Spouse name: Not on file  . Number of children: Not on file  . Years of education: Not on file  . Highest education level: Not on file  Occupational History  . Not on file  Social Needs  . Financial resource strain: Not on file  . Food insecurity:    Worry: Not on file    Inability: Not on file  . Transportation needs:    Medical: Not on file    Non-medical: Not on file  Tobacco Use  . Smoking status: Former Smoker    Packs/day: 1.00    Years: 40.00    Pack years: 40.00    Types: Cigarettes    Last attempt to quit: 02/14/2011    Years since quitting: 7.2  . Smokeless tobacco: Never Used  Substance and Sexual Activity  . Alcohol use: No  . Drug use: No  . Sexual activity: Not on file  Lifestyle  . Physical activity:    Days per week: Not on file    Minutes per session: Not on file  . Stress: Not on file  Relationships  . Social connections:    Talks on phone: Not on file    Gets together: Not on file    Attends religious service: Not on file    Active member of club or organization: Not on file    Attends meetings of clubs or organizations: Not on file    Relationship status: Not on file  . Intimate partner violence:    Fear of current or ex partner: Not on file  Emotionally abused: Not on file    Physically abused: Not on file    Forced sexual activity: Not on file  Other Topics Concern  . Not on file  Social History Narrative   The patient is married. He lives in [place]. He works full-time. He quit smoking in 2013.    FAMILY HISTORY: Family History  Problem Relation Age of Onset  . CAD Mother 49       Died of an MI  . CAD Father 27       Died of a massive MI  . COPD Sister   . Lung cancer Brother   . Bone cancer Paternal Uncle   . Colon cancer Neg Hx   . Breast cancer Neg Hx     ALLERGIES:  has No Known Allergies.  MEDICATIONS:  Current Outpatient Medications  Medication Sig Dispense Refill  . acetaminophen (TYLENOL) 500 MG tablet Take  1,000 mg by mouth every 6 (six) hours as needed (for pain.).    Marland Kitchen albuterol (PROAIR HFA) 108 (90 Base) MCG/ACT inhaler Inhale 2 puffs into the lungs every 6 (six) hours as needed for wheezing or shortness of breath.     Marland Kitchen aspirin EC 81 MG tablet Take 81 mg by mouth daily.    Marland Kitchen atorvastatin (LIPITOR) 80 MG tablet Take 1 tablet (80 mg total) by mouth daily at 6 PM. 90 tablet 3  . carvedilol (COREG) 6.25 MG tablet Take 1 tablet (6.25 mg total) by mouth 2 (two) times daily. 180 tablet 3  . colestipol (COLESTID) 1 g tablet Take 2 g by mouth daily as needed (stomach issues).     Marland Kitchen lidocaine-prilocaine (EMLA) cream Apply to affected area once 30 g 3  . lisinopril (PRINIVIL,ZESTRIL) 10 MG tablet Take 1 tablet (10 mg total) by mouth daily. 90 tablet 3  . nitroGLYCERIN (NITROSTAT) 0.4 MG SL tablet Place 1 tablet (0.4 mg total) under the tongue every 5 (five) minutes as needed for chest pain. X 3 doses 25 tablet 3  . omeprazole (PRILOSEC) 40 MG capsule Take 40 mg by mouth 2 (two) times daily.     . ondansetron (ZOFRAN) 8 MG tablet Take 1 tablet (8 mg total) by mouth 2 (two) times daily as needed for refractory nausea / vomiting. Start on day 3 after chemo. 30 tablet 1  . potassium chloride SA (KLOR-CON M20) 20 MEQ tablet Take 1 tablet (20 mEq total) by mouth daily. 90 tablet 1  . prochlorperazine (COMPAZINE) 10 MG tablet Take 1 tablet (10 mg total) by mouth every 6 (six) hours as needed (Nausea or vomiting). 30 tablet 1  . tamsulosin (FLOMAX) 0.4 MG CAPS capsule Take 0.4 mg by mouth daily after supper.    . tobramycin-dexamethasone (TOBRADEX) ophthalmic solution INSTILL 2 DROPS IN BOTH EYES EVERY 6 HOURS    . traZODone (DESYREL) 50 MG tablet Take 1 tablet (50 mg total) by mouth at bedtime. 30 tablet 0  . umeclidinium-vilanterol (ANORO ELLIPTA) 62.5-25 MCG/INH AEPB Inhale 1 puff into the lungs every morning.     No current facility-administered medications for this visit.    Facility-Administered Medications  Ordered in Other Visits  Medication Dose Route Frequency Provider Last Rate Last Dose  . heparin lock flush 100 unit/mL  500 Units Intravenous Once Earlie Server, MD         PHYSICAL EXAMINATION: ECOG PERFORMANCE STATUS: 0 - Asymptomatic Vitals:   05/20/18 0842 05/20/18 0855  BP: 114/74   Pulse: 73   Resp: 18  Temp: (!) 96.9 F (36.1 C)   SpO2:  96%   Filed Weights   05/20/18 0842  Weight: 155 lb 4.8 oz (70.4 kg)    Physical Exam Constitutional:      General: He is not in acute distress. HENT:     Head: Normocephalic and atraumatic.  Eyes:     General: No scleral icterus.    Pupils: Pupils are equal, round, and reactive to light.  Neck:     Musculoskeletal: Normal range of motion and neck supple.  Cardiovascular:     Rate and Rhythm: Normal rate and regular rhythm.     Heart sounds: Normal heart sounds.  Pulmonary:     Effort: Pulmonary effort is normal. No respiratory distress.     Breath sounds: No wheezing.  Abdominal:     General: Bowel sounds are normal. There is no distension.     Palpations: Abdomen is soft. There is no mass.     Tenderness: There is no abdominal tenderness.  Musculoskeletal: Normal range of motion.        General: No deformity.  Skin:    General: Skin is warm and dry.     Findings: No erythema or rash.  Neurological:     Mental Status: He is alert and oriented to person, place, and time.     Cranial Nerves: No cranial nerve deficit.     Coordination: Coordination normal.  Psychiatric:        Behavior: Behavior normal.        Thought Content: Thought content normal.      LABORATORY DATA:  I have reviewed the data as listed Lab Results  Component Value Date   WBC 4.7 05/20/2018   HGB 13.4 05/20/2018   HCT 39.7 05/20/2018   MCV 94.1 05/20/2018   PLT 181 05/20/2018   Recent Labs    05/06/18 0913 05/13/18 0845 05/20/18 0803  NA 142 139 138  K 4.2 3.9 3.6  CL 111 108 108  CO2 _0 GLUCOSE 112* 100* 82  BUN _1 CREATININE 0.92 0.84 0.92  CALCIUM 8.8* 8.4* 8.7*  GFRNONAA >60 >60 >60  GFRAA >60 >60 >60  PROT 6.5 6.2* 6.4*  ALBUMIN 3.6 3.6 3.7  AST _2 ALT _3 ALKPHOS 126 109 101  BILITOT 0.5 0.4 0.6   Iron/TIBC/Ferritin/ %Sat No results found for: IRON, TIBC, FERRITIN, IRONPCTSAT   RADIOGRAPHIC STUDIES: I have personally reviewed the radiological images as listed and agreed with the findings in the report. 03/11/2018 CT chest lung screen 1. New 2.4 cm right lower lobe nodule with a new necrotic appearing subcarinal lymph node. Lung-RADS 4B, suspicious. Additional imaging evaluation or consultation with Pulmonology or Thoracic Surgery recommended. These results will be called to the ordering clinician or representative by the Radiologist Assistant, and communication documented in the PACS or zVision Dashboard. 2. Aortic atherosclerosis (ICD10-170.0). Coronary arterycalcification.3.  Emphysema (ICD10-J43.9). 03/13/2018 PET scan # Hypermetabolic right lower lobe pulmonary nodule, consistent with primary bronchogenic carcinoma. Fluid density subcarinal structure demonstrates no significant hypermetabolism. Although this could represent an incidental benign lesion such as a bronchogenic cyst, given interval development since 02/24/2017, necrotic adenopathy cannot be excluded and tissue sampling should be considered.  03/26/2018 DIAGNOSIS:  A. SUBCARINA, CYSTIC MASS; FNA:  - NON-SMALL CELL CARCINOMA CONSISTENT WITH POORLY DIFFERENTIATED  ADENOCARCINOMA.   MRI brain 04/01/2018  Normal MRI head with and without contrast for age.  No intracranial metastasis.  ASSESSMENT & PLAN:  1. Encounter for antineoplastic chemotherapy   2. Malignant neoplasm of lower lobe of right lung ([place])   3. Anemia due to antineoplastic chemotherapy   4. Esophagitis   5. Diarrhea, unspecified type   Cancer Staging Malignant neoplasm of lower lobe of right lung [facility]) Staging form: Lung, AJCC 8th  Edition - Clinical stage from 05/13/2018: Stage IIIA (cT1c, cN2, cM0) - Signed by Earlie Server, MD on 05/13/2018  #Stage IIIA lung adenocarcinoma On concurrent chemo and radiation.   Labs reviewed and discussed with patient.  Counts acceptable to proceed with today's carboplatin AUC of 2 and Taxol 45 mg/m. #Mild anemia secondary to concurrent chemo and radiation.  Hemoglobin is normal today.  Continue to monitor. #Radiation-induced esophagitis, continue take omeprazole 40 mg twice daily.  Symptoms stable. #Diarrhea, likely secondary to laxative use.  No diarrhea today so far.  I asked patient to update me if continues to have diarrhea.We had a discussion about using Colace 100 mg daily as his bowel regimen, and use laxatives as rescue.  He voices understanding.   The patient knows to call the clinic with any problems questions or concerns.   Return of visit: 1 week for evaluation prior to next chemotherapy. We spent sufficient time to discuss many aspect of care, questions were answered to patient's satisfaction. Total face to face encounter time for this patient visit was 25 min. >50% of the time was  spent in counseling and coordination of care.   Earlie Server, MD, PhD Hematology Oncology [facility] Pager- [phone] 05/20/18

## 2018-05-20 NOTE — Progress Notes (Signed)
Patient here for follow up. States he only has 4 trazodone pills left and would like a refill.

## 2018-05-21 ENCOUNTER — Ambulatory Visit
Admission: RE | Admit: 2018-05-21 | Discharge: 2018-05-21 | Disposition: A | Discharge status: Home / Self Care | Payer: BLUE CROSS/BLUE SHIELD | Source: Ambulatory Visit | Attending: Radiation Oncology | Admitting: Radiation Oncology

## 2018-05-21 ENCOUNTER — Other Ambulatory Visit: Payer: Self-pay | Admitting: *Deleted

## 2018-05-21 DIAGNOSIS — Z510 Encounter for antineoplastic radiation therapy: Secondary | ICD-10-CM | POA: Diagnosis not present

## 2018-05-21 MED ORDER — SUCRALFATE 1 G PO TABS: 1.0000 g | ORAL_TABLET | Freq: Three times a day (TID) | ORAL | 6 refills | Status: DC

## 2018-05-22 ENCOUNTER — Ambulatory Visit
Admission: RE | Admit: 2018-05-22 | Discharge: 2018-05-22 | Disposition: A | Discharge status: Home / Self Care | Payer: BLUE CROSS/BLUE SHIELD | Source: Ambulatory Visit | Attending: Radiation Oncology | Admitting: Radiation Oncology

## 2018-05-22 DIAGNOSIS — Z510 Encounter for antineoplastic radiation therapy: Secondary | ICD-10-CM | POA: Diagnosis not present

## 2018-05-23 ENCOUNTER — Ambulatory Visit
Admission: RE | Admit: 2018-05-23 | Discharge: 2018-05-23 | Disposition: A | Discharge status: Home / Self Care | Payer: BLUE CROSS/BLUE SHIELD | Source: Ambulatory Visit | Attending: Radiation Oncology | Admitting: Radiation Oncology

## 2018-05-23 DIAGNOSIS — Z510 Encounter for antineoplastic radiation therapy: Secondary | ICD-10-CM | POA: Diagnosis not present

## 2018-05-24 ENCOUNTER — Ambulatory Visit
Admission: RE | Admit: 2018-05-24 | Discharge: 2018-05-24 | Disposition: A | Discharge status: Home / Self Care | Payer: BLUE CROSS/BLUE SHIELD | Source: Ambulatory Visit | Attending: Radiation Oncology | Admitting: Radiation Oncology

## 2018-05-24 DIAGNOSIS — Z510 Encounter for antineoplastic radiation therapy: Secondary | ICD-10-CM | POA: Diagnosis not present

## 2018-05-27 ENCOUNTER — Inpatient Hospital Stay: Payer: BLUE CROSS/BLUE SHIELD

## 2018-05-27 ENCOUNTER — Ambulatory Visit
Admission: RE | Admit: 2018-05-27 | Discharge: 2018-05-27 | Disposition: A | Discharge status: Home / Self Care | Payer: BLUE CROSS/BLUE SHIELD | Source: Ambulatory Visit | Attending: Radiation Oncology | Admitting: Radiation Oncology

## 2018-05-27 ENCOUNTER — Other Ambulatory Visit: Payer: Self-pay

## 2018-05-27 ENCOUNTER — Encounter: Payer: Self-pay | Admitting: Oncology

## 2018-05-27 ENCOUNTER — Inpatient Hospital Stay (HOSPITAL_BASED_OUTPATIENT_CLINIC_OR_DEPARTMENT_OTHER): Payer: BLUE CROSS/BLUE SHIELD | Admitting: Oncology

## 2018-05-27 ENCOUNTER — Inpatient Hospital Stay: Payer: BLUE CROSS/BLUE SHIELD | Attending: Oncology

## 2018-05-27 VITALS — BP 121/73 | HR 74 | Resp 18

## 2018-05-27 VITALS — BP 121/77 | HR 72 | Temp 96.9°F | Resp 18 | Wt 154.3 lb

## 2018-05-27 DIAGNOSIS — C3431 Malignant neoplasm of lower lobe, right bronchus or lung: Secondary | ICD-10-CM | POA: Insufficient documentation

## 2018-05-27 DIAGNOSIS — K5900 Constipation, unspecified: Secondary | ICD-10-CM

## 2018-05-27 DIAGNOSIS — K208 Other esophagitis: Secondary | ICD-10-CM

## 2018-05-27 DIAGNOSIS — T451X5A Adverse effect of antineoplastic and immunosuppressive drugs, initial encounter: Secondary | ICD-10-CM

## 2018-05-27 DIAGNOSIS — Z5111 Encounter for antineoplastic chemotherapy: Secondary | ICD-10-CM

## 2018-05-27 DIAGNOSIS — Z510 Encounter for antineoplastic radiation therapy: Secondary | ICD-10-CM | POA: Diagnosis not present

## 2018-05-27 DIAGNOSIS — D6481 Anemia due to antineoplastic chemotherapy: Secondary | ICD-10-CM | POA: Diagnosis not present

## 2018-05-27 DIAGNOSIS — K209 Esophagitis, unspecified without bleeding: Secondary | ICD-10-CM

## 2018-05-27 LAB — CBC WITH DIFFERENTIAL/PLATELET
ABS IMMATURE GRANULOCYTES: 0.06 10*3/uL (ref 0.00–0.07)
Basophils Absolute: 0 10*3/uL (ref 0.0–0.1)
Basophils Relative: 1 %
Eosinophils Absolute: 0 10*3/uL (ref 0.0–0.5)
Eosinophils Relative: 1 %
HCT: 37.2 % — ABNORMAL LOW (ref 39.0–52.0)
Hemoglobin: 12.9 g/dL — ABNORMAL LOW (ref 13.0–17.0)
Immature Granulocytes: 2 %
Lymphocytes Relative: 18 %
Lymphs Abs: 0.7 10*3/uL (ref 0.7–4.0)
MCH: 32.4 pg (ref 26.0–34.0)
MCHC: 34.7 g/dL (ref 30.0–36.0)
MCV: 93.5 fL (ref 80.0–100.0)
Monocytes Absolute: 0.4 10*3/uL (ref 0.1–1.0)
Monocytes Relative: 11 %
NEUTROS ABS: 2.6 10*3/uL (ref 1.7–7.7)
Neutrophils Relative %: 67 %
Platelets: 145 10*3/uL — ABNORMAL LOW (ref 150–400)
RBC: 3.98 MIL/uL — ABNORMAL LOW (ref 4.22–5.81)
RDW: 13.2 % (ref 11.5–15.5)
WBC: 3.8 10*3/uL — ABNORMAL LOW (ref 4.0–10.5)
nRBC: 0 % (ref 0.0–0.2)

## 2018-05-27 LAB — COMPREHENSIVE METABOLIC PANEL
ALT: 29 U/L (ref 0–44)
AST: 26 U/L (ref 15–41)
Albumin: 3.7 g/dL (ref 3.5–5.0)
Alkaline Phosphatase: 98 U/L (ref 38–126)
Anion gap: 5 (ref 5–15)
BUN: 14 mg/dL (ref 8–23)
CHLORIDE: 108 mmol/L (ref 98–111)
CO2: 26 mmol/L (ref 22–32)
Calcium: 8.7 mg/dL — ABNORMAL LOW (ref 8.9–10.3)
Creatinine, Ser: 0.92 mg/dL (ref 0.61–1.24)
GFR calc Af Amer: >60 mL/min (ref >60)
GFR calc non Af Amer: >60 mL/min (ref >60)
Glucose, Bld: 88 mg/dL (ref 70–99)
POTASSIUM: 3.7 mmol/L (ref 3.5–5.1)
Sodium: 139 mmol/L (ref 135–145)
Total Bilirubin: 0.6 mg/dL (ref 0.3–1.2)
Total Protein: 6.3 g/dL — ABNORMAL LOW (ref 6.5–8.1)

## 2018-05-27 MED ORDER — SODIUM CHLORIDE 0.9% FLUSH
10.0000 mL | Freq: Once | INTRAVENOUS | Status: AC
  Administered 2018-05-27: 10 mL via INTRAVENOUS
  Filled 2018-05-27: qty 10

## 2018-05-27 MED ORDER — HEPARIN SOD (PORK) LOCK FLUSH 100 UNIT/ML IV SOLN
500.0000 [IU] | Freq: Once | INTRAVENOUS | Status: AC | PRN
  Administered 2018-05-27: 500 [IU]

## 2018-05-27 MED ORDER — FAMOTIDINE IN NACL 20-0.9 MG/50ML-% IV SOLN
20.0000 mg | Freq: Once | INTRAVENOUS | Status: AC
  Administered 2018-05-27: 20 mg via INTRAVENOUS
  Filled 2018-05-27: qty 50

## 2018-05-27 MED ORDER — SODIUM CHLORIDE 0.9 % IV SOLN
230.0000 mg | Freq: Once | INTRAVENOUS | Status: AC
  Administered 2018-05-27: 230 mg via INTRAVENOUS
  Filled 2018-05-27: qty 23

## 2018-05-27 MED ORDER — HEPARIN SOD (PORK) LOCK FLUSH 100 UNIT/ML IV SOLN
500.0000 [IU] | Freq: Once | INTRAVENOUS | Status: DC
  Filled 2018-05-27: qty 5

## 2018-05-27 MED ORDER — SODIUM CHLORIDE 0.9 % IV SOLN
45.0000 mg/m2 | Freq: Once | INTRAVENOUS | Status: AC
  Administered 2018-05-27: 84 mg via INTRAVENOUS
  Filled 2018-05-27: qty 14

## 2018-05-27 MED ORDER — PALONOSETRON HCL INJECTION 0.25 MG/5ML
0.2500 mg | Freq: Once | INTRAVENOUS | Status: AC
  Administered 2018-05-27: 0.25 mg via INTRAVENOUS
  Filled 2018-05-27: qty 5

## 2018-05-27 MED ORDER — DIPHENHYDRAMINE HCL 50 MG/ML IJ SOLN
50.0000 mg | Freq: Once | INTRAMUSCULAR | Status: AC
  Administered 2018-05-27: 50 mg via INTRAVENOUS
  Filled 2018-05-27: qty 1

## 2018-05-27 MED ORDER — SODIUM CHLORIDE 0.9 % IV SOLN
Freq: Once | INTRAVENOUS | Status: AC
  Administered 2018-05-27: 09:00:00 via INTRAVENOUS
  Filled 2018-05-27: qty 250

## 2018-05-27 MED ORDER — SODIUM CHLORIDE 0.9 % IV SOLN
20.0000 mg | Freq: Once | INTRAVENOUS | Status: AC
  Administered 2018-05-27: 20 mg via INTRAVENOUS
  Filled 2018-05-27: qty 2

## 2018-05-27 NOTE — Progress Notes (Signed)
Hematology/Oncology follow up note [facility] [phone] [phone]   Patient Care Team: Baxter Hire, MD as PCP - General (Internal Medicine) Sherren Mocha, MD as PCP - Cardiology (Cardiology) Telford Nab, RN as Registered Nurse  REFERRING PROVIDER: Dr.Fleming REASON FOR VISIT:  Follow-up for management of stage III non-small cell lung cancer   HISTORY OF PRESENTING ILLNESS:  Timothy Yoder is a  63 y.o.  male with PMH listed below who was referred to me for evaluation of lung mass.  Patient is a former smoker quit smoking 7 years ago.  40-pack-year. Patient chest lung cancer screening on 03/11/2018. CT scan showed 2.4 cm right lower lobe nodule with a new necrotic.  Subcarinal lymph node. PET scan showed hypermetabolic right lower pulmonary nodule, consistent with primary bronchogenic carcinoma.  Fluid density subcarinal structure demonstrate no significant hypermetabolism.  Although this could represent an incidental benign lesion such as a cyst given the interval development since 02/24/2017, necrotic adenopathy cannot be excluded and tissue sampling should be considered.  Patient was seen by Dr. Faith Rogue for initial evaluation on 03/13/2018.  Patient has not got PET scan at that time. Today patient was accompanied by his wife to the oncology clinic to discuss management plan.  And PET scan results. Patient reports chronic shortness of breath with moderate exertion.  Appetite is good denies any fever, chills, hemoptysis, weight loss.  # underwent biopsy via bronchoscopy.  Subcarinal lymph node biopsy positive for non-small cell lung cancer, favoring poorly differentiated adenocarcinoma. #Mediport was placed by Dr. Genevive Bi   INTERVAL HISTORY Timothy Yoder is a 63 y.o. male who has above history reviewed by me today presents for follow up visit for management of stage III non-small cell lung cancer.  #Patient reports doing well.   Currently on concurrent chemo and radiation.  Chronic shortness of breath at baseline. Radiation-induced esophagitis, takes omeprazole 40 mg twice daily.  He was also started on sucralfate. Reports sometimes he has pain when he swallows. Constipation is better controlled after he started on daily stool softener.  Review of Systems  Constitutional: Positive for fatigue. Negative for appetite change, chills, fever and unexpected weight change.  HENT:   Negative for hearing loss and voice change.   Eyes: Negative for eye problems and icterus.  Respiratory: Negative for chest tightness, cough and shortness of breath.   Cardiovascular: Negative for chest pain and leg swelling.  Gastrointestinal: Negative for abdominal distention, abdominal pain, blood in stool, constipation and diarrhea.  Endocrine: Negative for hot flashes.  Genitourinary: Negative for difficulty urinating, dysuria and frequency.   Musculoskeletal: Negative for arthralgias.  Skin: Negative for itching and rash.  Neurological: Negative for extremity weakness, light-headedness and numbness.  Hematological: Negative for adenopathy. Does not bruise/bleed easily.  Psychiatric/Behavioral: Negative for confusion.   MEDICAL HISTORY:  Past Medical History:  Diagnosis Date  . BPH (benign prostatic hyperplasia)   . CAD (coronary artery disease)    a. 04/2014 anterolateral STEMI/PCI: LM nl, LAD 46m(2.75x16 Promus DES, D1 50p, LCX nl, RI nl, OM1 100 small/chronic, RCA dominant, [id number](FFR 0.90->Med Rx), RPDA 50, RPLA 70, EF 60%.  . CMarland KitchenPD (chronic obstructive pulmonary disease) (HCC)    Dr FleRaul Del Former tobacco use   . Gastroesophageal reflux disease   . HTN (hypertension)   . Hyperlipidemia   . Hypokalemia   . [place]  . [place]   Dr MicSherren Mocha  Stented coronary artery     SURGICAL HISTORY: Past Surgical History:  Procedure Laterality Date  .  CARDIAC CATHETERIZATION    . CHOLECYSTECTOMY N/A 02/15/2016   Procedure: LAPAROSCOPIC CHOLECYSTECTOMY WITH INTRAOPERATIVE CHOLANGIOGRAM;  Surgeon: Leonie Green, MD;  Location: [facility] ORS;  Service: General;  Laterality: N/A;  . COLONOSCOPY    . COLONOSCOPY WITH PROPOFOL N/A 08/02/2015   Procedure: COLONOSCOPY WITH PROPOFOL;  Surgeon: Manya Silvas, MD;  Location: [facility] ENDOSCOPY;  Service: Endoscopy;  Laterality: N/A;  . CORONARY ANGIOPLASTY  04/2014   STENT  . ENDOBRONCHIAL ULTRASOUND N/A 03/26/2018   Procedure: ENDOBRONCHIAL ULTRASOUND;  Surgeon: Flora Lipps, MD;  Location: [facility] ORS;  Service: Cardiopulmonary;  Laterality: N/A;  . ESOPHAGOGASTRODUODENOSCOPY (EGD) WITH PROPOFOL N/A 08/02/2015   Procedure: ESOPHAGOGASTRODUODENOSCOPY (EGD) WITH PROPOFOL;  Surgeon: Manya Silvas, MD;  Location: [facility] ENDOSCOPY;  Service: Endoscopy;  Laterality: N/A;  . FLEXIBLE BRONCHOSCOPY N/A 03/26/2018   Procedure: FLEXIBLE BRONCHOSCOPY;  Surgeon: Flora Lipps, MD;  Location: [facility] ORS;  Service: Cardiopulmonary;  Laterality: N/A;  . KNEE ARTHROSCOPY Left   . LEFT HEART CATHETERIZATION WITH CORONARY ANGIOGRAM N/A 05/03/2014   Procedure: LEFT HEART CATHETERIZATION WITH CORONARY ANGIOGRAM;  Surgeon: Blane Ohara, MD;  Location: [facility] CATH LAB;  Service: Cardiovascular;  Laterality: N/A;  . PERCUTANEOUS CORONARY STENT INTERVENTION (PCI-S) N/A 05/05/2014   Procedure: PERCUTANEOUS CORONARY STENT INTERVENTION (PCI-S);  Surgeon: Peter M Martinique, MD;  Location: [facility] CATH LAB;  Service: Cardiovascular;  Laterality: N/A;  . PORTACATH PLACEMENT Left 04/04/2018   Procedure: INSERTION PORT-A-CATH;  Surgeon: [name], MD;  Location: [facility] ORS;  Service: General;  Laterality: Left;    SOCIAL HISTORY: Social History   Socioeconomic History  . Marital status: Married    Spouse name: Not on file  . Number of children: Not on file  . Years of education: Not on file  . Highest education level: Not on file    Occupational History  . Not on file  Social Needs  . Financial resource strain: Not on file  . Food insecurity:    Worry: Not on file    Inability: Not on file  . Transportation needs:    Medical: Not on file    Non-medical: Not on file  Tobacco Use  . Smoking status: Former Smoker    Packs/day: 1.00    Years: 40.00    Pack years: 40.00    Types: Cigarettes    Last attempt to quit: 02/14/2011    Years since quitting: 7.2  . Smokeless tobacco: Never Used  Substance and Sexual Activity  . Alcohol use: No  . Drug use: No  . Sexual activity: Not on file  Lifestyle  . Physical activity:    Days per week: Not on file    Minutes per session: Not on file  . Stress: Not on file  Relationships  . Social connections:    Talks on phone: Not on file    Gets together: Not on file    Attends religious service: Not on file    Active member of club or organization: Not on file    Attends meetings of clubs or organizations: Not on file    Relationship status: Not on file  . Intimate partner violence:    Fear of current or ex partner: Not on file    Emotionally abused: Not on file    Physically abused: Not on file    Forced sexual activity: Not on file  Other Topics Concern  .  Not on file  Social History Narrative   The patient is married. He lives in [place]. He works full-time. He quit smoking in 2013.    FAMILY HISTORY: Family History  Problem Relation Age of Onset  . CAD Mother 47       Died of an MI  . CAD Father 71       Died of a massive MI  . COPD Sister   . Lung cancer Brother   . Bone cancer Paternal Uncle   . Colon cancer Neg Hx   . Breast cancer Neg Hx     ALLERGIES:  has No Known Allergies.  MEDICATIONS:  Current Outpatient Medications  Medication Sig Dispense Refill  . acetaminophen (TYLENOL) 500 MG tablet Take 1,000 mg by mouth every 6 (six) hours as needed (for pain.).    Marland Kitchen albuterol (PROAIR HFA) 108 (90 Base) MCG/ACT inhaler Inhale 2 puffs into  the lungs every 6 (six) hours as needed for wheezing or shortness of breath.     Marland Kitchen aspirin EC 81 MG tablet Take 81 mg by mouth daily.    Marland Kitchen atorvastatin (LIPITOR) 80 MG tablet Take 1 tablet (80 mg total) by mouth daily at 6 PM. 90 tablet 3  . carvedilol (COREG) 6.25 MG tablet Take 1 tablet (6.25 mg total) by mouth 2 (two) times daily. 180 tablet 3  . colestipol (COLESTID) 1 g tablet Take 2 g by mouth daily as needed (stomach issues).     Marland Kitchen lidocaine-prilocaine (EMLA) cream Apply to affected area once 30 g 3  . lisinopril (PRINIVIL,ZESTRIL) 10 MG tablet Take 1 tablet (10 mg total) by mouth daily. 90 tablet 3  . nitroGLYCERIN (NITROSTAT) 0.4 MG SL tablet Place 1 tablet (0.4 mg total) under the tongue every 5 (five) minutes as needed for chest pain. X 3 doses 25 tablet 3  . omeprazole (PRILOSEC) 40 MG capsule Take 40 mg by mouth 2 (two) times daily.     . ondansetron (ZOFRAN) 8 MG tablet Take 1 tablet (8 mg total) by mouth 2 (two) times daily as needed for refractory nausea / vomiting. Start on day 3 after chemo. 30 tablet 1  . potassium chloride SA (KLOR-CON M20) 20 MEQ tablet Take 1 tablet (20 mEq total) by mouth daily. 90 tablet 1  . prochlorperazine (COMPAZINE) 10 MG tablet Take 1 tablet (10 mg total) by mouth every 6 (six) hours as needed (Nausea or vomiting). 30 tablet 1  . sucralfate (CARAFATE) 1 g tablet Take 1 tablet (1 g total) by mouth 3 (three) times daily before meals. Dissolve in warm water, swish and swallow 90 tablet 6  . tamsulosin (FLOMAX) 0.4 MG CAPS capsule Take 0.4 mg by mouth daily after supper.    . tobramycin-dexamethasone (TOBRADEX) ophthalmic solution INSTILL 2 DROPS IN BOTH EYES EVERY 6 HOURS    . traZODone (DESYREL) 50 MG tablet Take 1 tablet (50 mg total) by mouth at bedtime. 30 tablet 2  . umeclidinium-vilanterol (ANORO ELLIPTA) 62.5-25 MCG/INH AEPB Inhale 1 puff into the lungs every morning.     No current facility-administered medications for this visit.     Facility-Administered Medications Ordered in Other Visits  Medication Dose Route Frequency Provider Last Rate Last Dose  . heparin lock flush 100 unit/mL  500 Units Intravenous Once Earlie Server, MD         PHYSICAL EXAMINATION: ECOG PERFORMANCE STATUS: 0 - Asymptomatic Vitals:   05/27/18 0900  BP: 121/77  Pulse: 72  Resp: 18  Temp: (!) 96.9 F (36.1 C)  SpO2: 98%   Filed Weights   05/27/18 0900  Weight: 154 lb 4.8 oz (70 kg)    Physical Exam Constitutional:      General: He is not in acute distress. HENT:     Head: Normocephalic and atraumatic.  Eyes:     General: No scleral icterus.    Pupils: Pupils are equal, round, and reactive to light.  Neck:     Musculoskeletal: Normal range of motion and neck supple.  Cardiovascular:     Rate and Rhythm: Normal rate and regular rhythm.     Heart sounds: Normal heart sounds.  Pulmonary:     Effort: Pulmonary effort is normal. No respiratory distress.     Breath sounds: No wheezing.  Abdominal:     General: Bowel sounds are normal. There is no distension.     Palpations: Abdomen is soft. There is no mass.     Tenderness: There is no abdominal tenderness.  Musculoskeletal: Normal range of motion.        General: No deformity.  Skin:    General: Skin is warm and dry.     Findings: No erythema or rash.  Neurological:     Mental Status: He is alert and oriented to person, place, and time.     Cranial Nerves: No cranial nerve deficit.     Coordination: Coordination normal.  Psychiatric:        Behavior: Behavior normal.        Thought Content: Thought content normal.      LABORATORY DATA:  I have reviewed the data as listed Lab Results  Component Value Date   WBC 3.8 (L) 05/27/2018   HGB 12.9 (L) 05/27/2018   HCT 37.2 (L) 05/27/2018   MCV 93.5 05/27/2018   PLT 145 (L) 05/27/2018   Recent Labs    05/06/18 0913 05/13/18 0845 05/20/18 0803  NA 142 139 138  K 4.2 3.9 3.6  CL 111 108 108  CO2 _0 GLUCOSE  112* 100* 82  BUN _1 CREATININE 0.92 0.84 0.92  CALCIUM 8.8* 8.4* 8.7*  GFRNONAA >60 >60 >60  GFRAA >60 >60 >60  PROT 6.5 6.2* 6.4*  ALBUMIN 3.6 3.6 3.7  AST _2 ALT _3 ALKPHOS 126 109 101  BILITOT 0.5 0.4 0.6   Iron/TIBC/Ferritin/ %Sat No results found for: IRON, TIBC, FERRITIN, IRONPCTSAT   RADIOGRAPHIC STUDIES: I have personally reviewed the radiological images as listed and agreed with the findings in the report. 03/11/2018 CT chest lung screen 1. New 2.4 cm right lower lobe nodule with a new necrotic appearing subcarinal lymph node. Lung-RADS 4B, suspicious. Additional imaging evaluation or consultation with Pulmonology or Thoracic Surgery recommended. These results will be called to the ordering clinician or representative by the Radiologist Assistant, and communication documented in the PACS or zVision Dashboard. 2. Aortic atherosclerosis (ICD10-170.0). Coronary arterycalcification.3.  Emphysema (ICD10-J43.9). 03/13/2018 PET scan # Hypermetabolic right lower lobe pulmonary nodule, consistent with primary bronchogenic carcinoma. Fluid density subcarinal structure demonstrates no significant hypermetabolism. Although this could represent an incidental benign lesion such as a bronchogenic cyst, given interval development since 02/24/2017, necrotic adenopathy cannot be excluded and tissue sampling should be considered.  03/26/2018 DIAGNOSIS:  A. SUBCARINA, CYSTIC MASS; FNA:  - NON-SMALL CELL CARCINOMA CONSISTENT WITH POORLY DIFFERENTIATED  ADENOCARCINOMA.   MRI brain 04/01/2018  Normal MRI head with and without contrast for age.  No intracranial  metastasis.   ASSESSMENT & PLAN:  1. Malignant neoplasm of lower lobe of right lung ([place])   2. Encounter for antineoplastic chemotherapy   3. Anemia due to antineoplastic chemotherapy   4. Esophagitis   Cancer Staging Malignant neoplasm of lower lobe of right lung [facility]) Staging form: Lung, AJCC 8th Edition -  Clinical stage from 05/13/2018: Stage IIIA (cT1c, cN2, cM0) - Signed by Earlie Server, MD on 05/13/2018  #Stage IIIA lung adenocarcinoma On concurrent chemo and radiation.   Labs reviewed and discussed with patient.  Counts acceptable to proceed with today's carboplatin AUC of 2 and Taxol 45 mg/m.  #Mild anemia secondary to concurrent chemoradiation.  Hemoglobin has been stable.  Continue to monitor. #Radiation-induced esophagitis, symptoms stable.  Continue taking omeprazole 40 mg twice daily.  Continue sucralfate.  I also gave him prescription of Magic mouthwash if he needs to alleviate while swallowing. Pain   The patient knows to call the clinic with any problems questions or concerns.   Return of visit: 1 week for evaluation prior to next chemotherapy. We spent sufficient time to discuss many aspect of care, questions were answered to patient's satisfaction. Total face to face encounter time for this patient visit was 25 min. >50% of the time was  spent in counseling and coordination of care.   Earlie Server, MD, PhD Hematology Oncology [facility] at [facility] Pager- [phone] 05/27/18

## 2018-05-27 NOTE — Progress Notes (Signed)
Patient here for follow up. No concerns voiced.  °

## 2018-05-28 ENCOUNTER — Ambulatory Visit
Admission: RE | Admit: 2018-05-28 | Discharge: 2018-05-28 | Disposition: A | Discharge status: Home / Self Care | Payer: BLUE CROSS/BLUE SHIELD | Source: Ambulatory Visit | Attending: Radiation Oncology | Admitting: Radiation Oncology

## 2018-05-28 DIAGNOSIS — Z510 Encounter for antineoplastic radiation therapy: Secondary | ICD-10-CM | POA: Diagnosis not present

## 2018-05-29 ENCOUNTER — Ambulatory Visit
Admission: RE | Admit: 2018-05-29 | Discharge: 2018-05-29 | Disposition: A | Discharge status: Home / Self Care | Payer: BLUE CROSS/BLUE SHIELD | Source: Ambulatory Visit | Attending: Radiation Oncology | Admitting: Radiation Oncology

## 2018-05-29 DIAGNOSIS — Z510 Encounter for antineoplastic radiation therapy: Secondary | ICD-10-CM | POA: Diagnosis not present

## 2018-05-30 ENCOUNTER — Ambulatory Visit
Admission: RE | Admit: 2018-05-30 | Discharge: 2018-05-30 | Disposition: A | Discharge status: Home / Self Care | Payer: BLUE CROSS/BLUE SHIELD | Source: Ambulatory Visit | Attending: Radiation Oncology | Admitting: Radiation Oncology

## 2018-05-30 DIAGNOSIS — Z510 Encounter for antineoplastic radiation therapy: Secondary | ICD-10-CM | POA: Diagnosis not present

## 2018-05-31 ENCOUNTER — Ambulatory Visit
Admission: RE | Admit: 2018-05-31 | Discharge: 2018-05-31 | Disposition: A | Discharge status: Home / Self Care | Payer: BLUE CROSS/BLUE SHIELD | Source: Ambulatory Visit | Attending: Radiation Oncology | Admitting: Radiation Oncology

## 2018-05-31 DIAGNOSIS — Z510 Encounter for antineoplastic radiation therapy: Secondary | ICD-10-CM | POA: Diagnosis not present

## 2018-06-03 ENCOUNTER — Inpatient Hospital Stay (HOSPITAL_BASED_OUTPATIENT_CLINIC_OR_DEPARTMENT_OTHER): Payer: BLUE CROSS/BLUE SHIELD | Admitting: Oncology

## 2018-06-03 ENCOUNTER — Other Ambulatory Visit: Payer: Self-pay | Admitting: Oncology

## 2018-06-03 ENCOUNTER — Inpatient Hospital Stay: Payer: BLUE CROSS/BLUE SHIELD

## 2018-06-03 ENCOUNTER — Telehealth: Payer: Self-pay

## 2018-06-03 ENCOUNTER — Encounter: Payer: Self-pay | Admitting: Oncology

## 2018-06-03 ENCOUNTER — Ambulatory Visit: Payer: BLUE CROSS/BLUE SHIELD

## 2018-06-03 ENCOUNTER — Other Ambulatory Visit: Payer: Self-pay

## 2018-06-03 ENCOUNTER — Ambulatory Visit
Admission: RE | Admit: 2018-06-03 | Discharge: 2018-06-03 | Disposition: A | Discharge status: Home / Self Care | Payer: BLUE CROSS/BLUE SHIELD | Source: Ambulatory Visit | Attending: Oncology | Admitting: Oncology

## 2018-06-03 ENCOUNTER — Ambulatory Visit
Admission: RE | Admit: 2018-06-03 | Discharge: 2018-06-03 | Disposition: A | Discharge status: Home / Self Care | Payer: BLUE CROSS/BLUE SHIELD | Source: Ambulatory Visit | Attending: Radiation Oncology | Admitting: Radiation Oncology

## 2018-06-03 VITALS — BP 158/82 | HR 70 | Temp 96.8°F | Wt 154.7 lb

## 2018-06-03 DIAGNOSIS — D6481 Anemia due to antineoplastic chemotherapy: Secondary | ICD-10-CM | POA: Diagnosis not present

## 2018-06-03 DIAGNOSIS — Z95828 Presence of other vascular implants and grafts: Secondary | ICD-10-CM

## 2018-06-03 DIAGNOSIS — Z510 Encounter for antineoplastic radiation therapy: Secondary | ICD-10-CM | POA: Diagnosis not present

## 2018-06-03 DIAGNOSIS — K208 Other esophagitis: Secondary | ICD-10-CM | POA: Diagnosis not present

## 2018-06-03 DIAGNOSIS — C3431 Malignant neoplasm of lower lobe, right bronchus or lung: Secondary | ICD-10-CM

## 2018-06-03 DIAGNOSIS — D6959 Other secondary thrombocytopenia: Secondary | ICD-10-CM

## 2018-06-03 DIAGNOSIS — Z5111 Encounter for antineoplastic chemotherapy: Secondary | ICD-10-CM

## 2018-06-03 DIAGNOSIS — K5900 Constipation, unspecified: Secondary | ICD-10-CM | POA: Diagnosis not present

## 2018-06-03 DIAGNOSIS — T451X5A Adverse effect of antineoplastic and immunosuppressive drugs, initial encounter: Secondary | ICD-10-CM

## 2018-06-03 DIAGNOSIS — K209 Esophagitis, unspecified without bleeding: Secondary | ICD-10-CM

## 2018-06-03 DIAGNOSIS — E119 Type 2 diabetes mellitus without complications: Secondary | ICD-10-CM

## 2018-06-03 LAB — CBC WITH DIFFERENTIAL/PLATELET
Abs Immature Granulocytes: 0.04 10*3/uL (ref 0.00–0.07)
Basophils Absolute: 0 10*3/uL (ref 0.0–0.1)
Basophils Relative: 1 %
EOS PCT: 0 %
Eosinophils Absolute: 0 10*3/uL (ref 0.0–0.5)
HCT: 36.3 % — ABNORMAL LOW (ref 39.0–52.0)
HEMOGLOBIN: 12.4 g/dL — AB (ref 13.0–17.0)
Immature Granulocytes: 1 %
Lymphocytes Relative: 16 %
Lymphs Abs: 0.5 10*3/uL — ABNORMAL LOW (ref 0.7–4.0)
MCH: 32.4 pg (ref 26.0–34.0)
MCHC: 34.2 g/dL (ref 30.0–36.0)
MCV: 94.8 fL (ref 80.0–100.0)
Monocytes Absolute: 0.4 10*3/uL (ref 0.1–1.0)
Monocytes Relative: 12 %
Neutro Abs: 2.3 10*3/uL (ref 1.7–7.7)
Neutrophils Relative %: 70 %
Platelets: 142 10*3/uL — ABNORMAL LOW (ref 150–400)
RBC: 3.83 MIL/uL — ABNORMAL LOW (ref 4.22–5.81)
RDW: 14.1 % (ref 11.5–15.5)
WBC: 3.3 10*3/uL — ABNORMAL LOW (ref 4.0–10.5)
nRBC: 0 % (ref 0.0–0.2)

## 2018-06-03 LAB — COMPREHENSIVE METABOLIC PANEL
ALT: 33 U/L (ref 0–44)
AST: 27 U/L (ref 15–41)
Albumin: 3.5 g/dL (ref 3.5–5.0)
Alkaline Phosphatase: 88 U/L (ref 38–126)
Anion gap: 4 — ABNORMAL LOW (ref 5–15)
BUN: 13 mg/dL (ref 8–23)
CO2: 26 mmol/L (ref 22–32)
Calcium: 8.4 mg/dL — ABNORMAL LOW (ref 8.9–10.3)
Chloride: 108 mmol/L (ref 98–111)
Creatinine, Ser: 0.93 mg/dL (ref 0.61–1.24)
GFR calc Af Amer: >60 mL/min (ref >60)
Glucose, Bld: 114 mg/dL — ABNORMAL HIGH (ref 70–99)
POTASSIUM: 3.9 mmol/L (ref 3.5–5.1)
Sodium: 138 mmol/L (ref 135–145)
Total Bilirubin: 0.6 mg/dL (ref 0.3–1.2)
Total Protein: 6.1 g/dL — ABNORMAL LOW (ref 6.5–8.1)

## 2018-06-03 MED ORDER — PALONOSETRON HCL INJECTION 0.25 MG/5ML
0.2500 mg | Freq: Once | INTRAVENOUS | Status: AC
  Administered 2018-06-03: 0.25 mg via INTRAVENOUS
  Filled 2018-06-03: qty 5

## 2018-06-03 MED ORDER — SODIUM CHLORIDE 0.9 % IV SOLN
20.0000 mg | Freq: Once | INTRAVENOUS | Status: AC
  Administered 2018-06-03: 20 mg via INTRAVENOUS
  Filled 2018-06-03: qty 2

## 2018-06-03 MED ORDER — SODIUM CHLORIDE 0.9 % IV SOLN
45.0000 mg/m2 | Freq: Once | INTRAVENOUS | Status: DC
  Filled 2018-06-03: qty 14

## 2018-06-03 MED ORDER — HEPARIN SOD (PORK) LOCK FLUSH 100 UNIT/ML IV SOLN
INTRAVENOUS | Status: AC
  Filled 2018-06-03: qty 5

## 2018-06-03 MED ORDER — SODIUM CHLORIDE 0.9 % IV SOLN
230.0000 mg | Freq: Once | INTRAVENOUS | Status: DC
  Filled 2018-06-03: qty 23

## 2018-06-03 MED ORDER — IOPAMIDOL (ISOVUE-300) INJECTION 61%
20.0000 mL | Freq: Once | INTRAVENOUS | Status: AC | PRN
  Administered 2018-06-03: 20 mL via INTRA_ARTERIAL

## 2018-06-03 MED ORDER — HEPARIN SOD (PORK) LOCK FLUSH 100 UNIT/ML IV SOLN
500.0000 [IU] | Freq: Once | INTRAVENOUS | Status: DC | PRN
  Filled 2018-06-03: qty 5

## 2018-06-03 MED ORDER — DIPHENHYDRAMINE HCL 50 MG/ML IJ SOLN
50.0000 mg | Freq: Once | INTRAMUSCULAR | Status: AC
  Administered 2018-06-03: 50 mg via INTRAVENOUS
  Filled 2018-06-03: qty 1

## 2018-06-03 MED ORDER — SODIUM CHLORIDE 0.9 % IV SOLN
Freq: Once | INTRAVENOUS | Status: AC
  Administered 2018-06-03: 10:00:00 via INTRAVENOUS
  Filled 2018-06-03: qty 250

## 2018-06-03 MED ORDER — FAMOTIDINE IN NACL 20-0.9 MG/50ML-% IV SOLN
20.0000 mg | Freq: Once | INTRAVENOUS | Status: AC
  Administered 2018-06-03: 20 mg via INTRAVENOUS
  Filled 2018-06-03: qty 50

## 2018-06-03 MED ORDER — SODIUM CHLORIDE 0.9% FLUSH
10.0000 mL | INTRAVENOUS | Status: DC | PRN
  Filled 2018-06-03: qty 10

## 2018-06-03 MED ORDER — HEPARIN SOD (PORK) LOCK FLUSH 100 UNIT/ML IV SOLN
500.0000 [IU] | Freq: Once | INTRAVENOUS | Status: AC
  Administered 2018-06-03: 500 [IU] via INTRAVENOUS
  Filled 2018-06-03: qty 5

## 2018-06-03 NOTE — Progress Notes (Signed)
Nutrition Assessment   Reason for Assessment:  Verbal referral from RN, Rosa for taste change, poor appetite   ASSESSMENT:   63 year old male with stage III non-small cell lung cancer. Patient receiving concurrent radiation and chemotherapy.  Final chemo and radiation treatment due 2/17.  Past medical history of CAD, COPD, GERD, HLD, HTN, MI  Met with patient during infusion today.  Patient reports biggest challenge in taste alterations.  "Food has no taste."  Reports that typically he has toast or cheese toast for breakfast or cereal, sometimes an egg.  Has been eating lots of soup lately and is getting tired of that.  Salads (no meat) and fruits have been working well.     Nutrition Focused Physical Exam: deferred   Medications: zofran, compazine,carafate   Labs: glucose 114   Anthropometrics:   Height: 68 inches Weight: 154 lb 11.2 oz UBW: 165-168 lb BMI: 23  8% weight loss in the last month, significant  Estimated Energy Needs  Kcals: 2100-2450 calories Protein: 105-122 g Fluid: >2 L/d   NUTRITION DIAGNOSIS: Inadequate oral intake related to taste alterations as evidenced by 8% weight loss in the last month.     INTERVENTION:  Discussed importance of good nutrition during treatment.  Encouraged good source of protein during every meal/snack.  Provided examples of foods high in protein Discussed strategies to help with taste changes.  Handout provided.  Contact information provided.     MONITORING, EVALUATION, GOAL: Patient will consume adequate calories and protein to prevent further weight loss   Next Visit: to be determined  Loie Jahr B. Zenia Resides, [place], [place] Registered Dietitian [phone] (pager)

## 2018-06-03 NOTE — Progress Notes (Signed)
Unable to obtain blood return from port.  Dr. Tasia Catchings ordering dye study.  Chemo on hold for now

## 2018-06-03 NOTE — Telephone Encounter (Signed)
Per infusion nurse, Rosa, unable to get blood return from patient's port. Dr. Tasia Catchings notified and gave order to hold chemo and set up dye study. Dye study set up and patient is scheduled at 330pm today. Patient and infusion nurse notified.

## 2018-06-03 NOTE — Progress Notes (Signed)
Hematology/Oncology follow up note Timothy Yoder [phone] [phone]   Patient Care Team: Timothy Hire, Yoder as PCP - General (Internal Medicine) Timothy Mocha, Yoder as PCP - Cardiology (Cardiology) Timothy Nab, RN as Registered Nurse  REFERRING PROVIDER: Dr.Fleming REASON FOR VISIT:  Follow-up for management of stage III non-small cell lung cancer   HISTORY OF PRESENTING ILLNESS:  Timothy Yoder is a  63 y.o.  male with PMH listed below who was referred to me for evaluation of lung mass.  Patient is a former smoker quit smoking 7 years ago.  40-pack-year. Patient chest lung cancer screening on 03/11/2018. CT scan showed 2.4 cm right lower lobe nodule with a new necrotic.  Subcarinal lymph node. PET scan showed hypermetabolic right lower pulmonary nodule, consistent with primary bronchogenic carcinoma.  Fluid density subcarinal structure demonstrate no significant hypermetabolism.  Although this could represent an incidental benign lesion such as a cyst given the interval development since 02/24/2017, necrotic adenopathy cannot be excluded and tissue sampling should be considered.  Patient was seen by Dr. Faith Yoder for initial evaluation on 03/13/2018.  Patient has not got PET scan at that time. Today patient was accompanied by his wife to the oncology clinic to discuss management plan.  And PET scan results. Patient reports chronic shortness of breath with moderate exertion.  Appetite is good denies any fever, chills, hemoptysis, weight loss.  # underwent biopsy via bronchoscopy.  Subcarinal lymph node biopsy positive for non-small cell lung cancer, favoring poorly differentiated adenocarcinoma. #Mediport was placed by Timothy Yoder   INTERVAL HISTORY Timothy Yoder is a 63 y.o. male who has above history reviewed by me today presents for follow up visit for management of stage III non-small cell lung cancer.  Reports doing well.  Currently on  concurrent chemo and radiation. Chronic shortness of breath at baseline. Radiation-induced esophagitis, he takes omeprazole 40 mg twice daily.  As well as sucralfate.  Denies any more dysphagia. Constipation has been better controlled.  #Patient reports doing well.  Currently on concurrent chemo and radiation.   Review of Systems  Constitutional: Positive for fatigue. Negative for appetite change, chills, fever and unexpected weight change.  HENT:   Negative for hearing loss and voice change.   Eyes: Negative for eye problems and icterus.  Respiratory: Negative for chest tightness, cough and shortness of breath.   Cardiovascular: Negative for chest pain and leg swelling.  Gastrointestinal: Negative for abdominal distention, abdominal pain, blood in stool, constipation and diarrhea.  Endocrine: Negative for hot flashes.  Genitourinary: Negative for difficulty urinating, dysuria and frequency.   Musculoskeletal: Negative for arthralgias.  Skin: Negative for itching and rash.  Neurological: Negative for extremity weakness, light-headedness and numbness.  Hematological: Negative for adenopathy. Does not bruise/bleed easily.  Psychiatric/Behavioral: Negative for confusion.   MEDICAL HISTORY:  Past Medical History:  Diagnosis Date  . BPH (benign prostatic hyperplasia)   . CAD (coronary artery disease)    a. 04/2014 anterolateral STEMI/PCI: LM nl, LAD 79m(2.75x16 Promus DES, D1 50p, LCX nl, RI nl, OM1 100 small/chronic, RCA dominant, 20-383m7016m(FFR 0.90->Med Rx), RPDA 50, RPLA 70, EF 60%.  . CMarland KitchenPD (chronic obstructive pulmonary disease) (HCC)    Dr FleRaul Del Former tobacco use   . Gastroesophageal reflux disease   . HTN (hypertension)   . Hyperlipidemia   . Hypokalemia   . [place]  . [place] infarction ([place]   Dr MicSherren Yoder  Stented coronary artery     SURGICAL HISTORY: Past Surgical History:  Procedure Laterality Date  .  CARDIAC CATHETERIZATION    . CHOLECYSTECTOMY N/A 02/15/2016   Procedure: LAPAROSCOPIC CHOLECYSTECTOMY WITH INTRAOPERATIVE CHOLANGIOGRAM;  Surgeon: Timothy Yoder;  Location: [facility] ORS;  Service: General;  Laterality: N/A;  . COLONOSCOPY    . COLONOSCOPY WITH PROPOFOL N/A 08/02/2015   Procedure: COLONOSCOPY WITH PROPOFOL;  Surgeon: Timothy Yoder;  Location: [facility] ENDOSCOPY;  Service: Endoscopy;  Laterality: N/A;  . CORONARY ANGIOPLASTY  04/2014   STENT  . ENDOBRONCHIAL ULTRASOUND N/A 03/26/2018   Procedure: ENDOBRONCHIAL ULTRASOUND;  Surgeon: Timothy Lipps, Yoder;  Location: [facility] ORS;  Service: Cardiopulmonary;  Laterality: N/A;  . ESOPHAGOGASTRODUODENOSCOPY (EGD) WITH PROPOFOL N/A 08/02/2015   Procedure: ESOPHAGOGASTRODUODENOSCOPY (EGD) WITH PROPOFOL;  Surgeon: Timothy Yoder;  [facility];  Service: Endoscopy;  Laterality: N/A;  . FLEXIBLE BRONCHOSCOPY N/A 03/26/2018   Procedure: FLEXIBLE BRONCHOSCOPY;  Surgeon: Timothy Lipps, Yoder;  Location: [facility] ORS;  Service: Cardiopulmonary;  Laterality: N/A;  . KNEE ARTHROSCOPY Left   . LEFT HEART CATHETERIZATION WITH CORONARY ANGIOGRAM N/A 05/03/2014   Procedure: LEFT HEART CATHETERIZATION WITH CORONARY ANGIOGRAM;  Surgeon: Timothy Ohara, Yoder;  Location[facility] C[facility] LAB;  Service: Cardiovascular;  Laterality: N/A;  . PERCUTANEOUS CORONARY STENT INTERVENTION (PCI-S) N/A 05/05/2014   Procedure: PERCUTANEOUS CORONARY STENT INTERVENTION (PCI-S);  Surgeon: Timothy M Martinique, Yoder;  Location: [facility] CATH LAB;  Service: Cardiovascular;  Laterality: N/A;  . PORTACATH PLACEMENT Left 04/04/2018   Procedure: INSERTION PORT-A-CATH;  Surgeon: Timothy Lewandowsky, Yoder;  Location: [facility] ORS;  Service: General;  Laterality: Left;    SOCIAL HISTORY: Social History   Socioeconomic History  . Marital status: Married    Spouse name: Not on file  . Number of children: Not on file  . Years of education: Not on file  . Highest education level: Not on file    Occupational History  . Not on file  Social Needs  . Financial resource strain: Not on file  . Food insecurity:    Worry: Not on file    Inability: Not on file  . Transportation needs:    Medical: Not on file    Non-medical: Not on file  Tobacco Use  . Smoking status: Former Smoker    Packs/day: 1.00    Years: 40.00    Pack years: 40.00    Types: Cigarettes    Last attempt to quit: 02/14/2011    Years since quitting: 7.3  . Smokeless tobacco: Never Used  Substance and Sexual Activity  . Alcohol use: No  . Drug use: No  . Sexual activity: Not on file  Lifestyle  . Physical activity:    Days per week: Not on file    Minutes per session: Not on file  . Stress: Not on file  Relationships  . Social connections:    Talks on phone: Not on file    Gets together: Not on file    Attends religious service: Not on file    Active member of club or organization: Not on file    Attends meetings of clubs or organizations: Not on file    Relationship status: Not on file  . Intimate partner violence:    Fear of current or ex partner: Not on file    Emotionally abused: Not on file    Physically abused: Not on file    Forced sexual activity: Not on file  Other Topics Concern  .  Not on file  Social History Narrative   The patient is married. He lives in [place]. He works full-time. He quit smoking in 2013.    FAMILY HISTORY: Family History  Problem Relation Age of Onset  . CAD Mother 6       Died of an MI  . CAD Father 44       Died of a massive MI  . COPD Sister   . Lung cancer Brother   . Bone cancer Paternal Uncle   . Colon cancer Neg Hx   . Breast cancer Neg Hx     ALLERGIES:  has No Known Allergies.  MEDICATIONS:  Current Outpatient Medications  Medication Sig Dispense Refill  . acetaminophen (TYLENOL) 500 MG tablet Take 1,000 mg by mouth every 6 (six) hours as needed (for pain.).    Marland Kitchen albuterol (PROAIR HFA) 108 (90 Base) MCG/ACT inhaler Inhale 2 puffs into  the lungs every 6 (six) hours as needed for wheezing or shortness of breath.     Marland Kitchen aspirin EC 81 MG tablet Take 81 mg by mouth daily.    Marland Kitchen atorvastatin (LIPITOR) 80 MG tablet Take 1 tablet (80 mg total) by mouth daily at 6 PM. 90 tablet 3  . carvedilol (COREG) 6.25 MG tablet Take 1 tablet (6.25 mg total) by mouth 2 (two) times daily. 180 tablet 3  . lidocaine-prilocaine (EMLA) cream Apply to affected area once 30 g 3  . lisinopril (PRINIVIL,ZESTRIL) 10 MG tablet Take 1 tablet (10 mg total) by mouth daily. 90 tablet 3  . nitroGLYCERIN (NITROSTAT) 0.4 MG SL tablet Place 1 tablet (0.4 mg total) under the tongue every 5 (five) minutes as needed for chest pain. X 3 doses 25 tablet 3  . omeprazole (PRILOSEC) 40 MG capsule Take 40 mg by mouth 2 (two) times daily.     . ondansetron (ZOFRAN) 8 MG tablet Take 1 tablet (8 mg total) by mouth 2 (two) times daily as needed for refractory nausea / vomiting. Start on day 3 after chemo. 30 tablet 1  . potassium chloride SA (KLOR-CON M20) 20 MEQ tablet Take 1 tablet (20 mEq total) by mouth daily. 90 tablet 1  . prochlorperazine (COMPAZINE) 10 MG tablet Take 1 tablet (10 mg total) by mouth every 6 (six) hours as needed (Nausea or vomiting). 30 tablet 1  . sucralfate (CARAFATE) 1 g tablet Take 1 tablet (1 g total) by mouth 3 (three) times daily before meals. Dissolve in warm water, swish and swallow 90 tablet 6  . tamsulosin (FLOMAX) 0.4 MG CAPS capsule Take 0.4 mg by mouth daily after supper.    . tobramycin-dexamethasone (TOBRADEX) ophthalmic solution INSTILL 2 DROPS IN BOTH EYES EVERY 6 HOURS    . traZODone (DESYREL) 50 MG tablet Take 1 tablet (50 mg total) by mouth at bedtime. 30 tablet 2  . umeclidinium-vilanterol (ANORO ELLIPTA) 62.5-25 MCG/INH AEPB Inhale 1 puff into the lungs every morning.    . colestipol (COLESTID) 1 g tablet Take 2 g by mouth daily as needed (stomach issues).      No current facility-administered medications for this visit.     Facility-Administered Medications Ordered in Other Visits  Medication Dose Route Frequency Provider Last Rate Last Dose  . heparin lock flush 100 UNIT/ML injection              PHYSICAL EXAMINATION: ECOG PERFORMANCE STATUS: 0 - Asymptomatic Vitals:   06/03/18 0934  BP: (!) 158/82  Pulse: 70  Temp: (!) 96.8 F (  36 C)   Filed Weights   06/03/18 0934  Weight: 154 lb 11.2 oz (70.2 kg)    Physical Exam Constitutional:      General: He is not in acute distress. HENT:     Head: Normocephalic and atraumatic.  Eyes:     General: No scleral icterus.    Pupils: Pupils are equal, round, and reactive to light.  Neck:     Musculoskeletal: Normal range of motion and neck supple.  Cardiovascular:     Rate and Rhythm: Normal rate and regular rhythm.     Heart sounds: Normal heart sounds.  Pulmonary:     Effort: Pulmonary effort is normal. No respiratory distress.     Breath sounds: No wheezing.  Abdominal:     General: Bowel sounds are normal. There is no distension.     Palpations: Abdomen is soft. There is no mass.     Tenderness: There is no abdominal tenderness.  Musculoskeletal: Normal range of motion.        General: No deformity.  Skin:    General: Skin is warm and dry.     Findings: No erythema or rash.  Neurological:     Mental Status: He is alert and oriented to person, place, and time.     Cranial Nerves: No cranial nerve deficit.     Coordination: Coordination normal.  Psychiatric:        Behavior: Behavior normal.        Thought Content: Thought content normal.      LABORATORY DATA:  I have reviewed the data as listed Lab Results  Component Value Date   WBC 3.3 (L) 06/03/2018   HGB 12.4 (L) 06/03/2018   HCT 36.3 (L) 06/03/2018   MCV 94.8 06/03/2018   PLT 142 (L) 06/03/2018   Recent Labs    05/20/18 0803 05/27/18 0811 06/03/18 0827  NA 138 139 138  K 3.6 3.7 3.9  CL 108 108 108  CO2 _0 GLUCOSE 82 88 114*  BUN _1 CREATININE 0.92  0.92 0.93  CALCIUM 8.7* 8.7* 8.4*  GFRNONAA >60 >60 >60  GFRAA >60 >60 >60  PROT 6.4* 6.3* 6.1*  ALBUMIN 3.7 3.7 3.5  AST _2 ALT 29 29 33  ALKPHOS 101 98 88  BILITOT 0.6 0.6 0.6   Iron/TIBC/Ferritin/ %Sat No results found for: IRON, TIBC, FERRITIN, IRONPCTSAT   RADIOGRAPHIC STUDIES: I have personally reviewed the radiological images as listed and agreed with the findings in the report. 03/11/2018 CT chest lung screen 1. New 2.4 cm right lower lobe nodule with a new necrotic appearing subcarinal lymph node. Lung-RADS 4B, suspicious. Additional imaging evaluation or consultation with Pulmonology or Thoracic Surgery recommended. These results will be called to the ordering clinician or representative by the Radiologist Assistant, and communication documented in the PACS or zVision Dashboard. 2. Aortic atherosclerosis (ICD10-170.0). Coronary arterycalcification.3.  Emphysema (ICD10-J43.9). 03/13/2018 PET scan # Hypermetabolic right lower lobe pulmonary nodule, consistent with primary bronchogenic carcinoma. Fluid density subcarinal structure demonstrates no significant hypermetabolism. Although this could represent an incidental benign lesion such as a bronchogenic cyst, given interval development since 02/24/2017, necrotic adenopathy cannot be excluded and tissue sampling should be considered.  03/26/2018 DIAGNOSIS:  A. SUBCARINA, CYSTIC MASS; FNA:  - NON-SMALL CELL CARCINOMA CONSISTENT WITH POORLY DIFFERENTIATED  ADENOCARCINOMA.   MRI brain 04/01/2018  Normal MRI head with and without contrast for age.  No intracranial metastasis.   ASSESSMENT & PLAN:  1. Malignant neoplasm of lower lobe of right lung ([place])   2. Encounter for antineoplastic chemotherapy   3. Anemia due to antineoplastic chemotherapy   4. Esophagitis   5. Port-A-Cath in place   Cancer Staging Malignant neoplasm of lower lobe of right lung [facility]) Staging form: Lung, AJCC 8th Edition - Clinical stage from  05/13/2018: Stage IIIA (cT1c, cN2, cM0) - Signed by Earlie Server, Yoder on 05/13/2018  #Stage IIIA lung adenocarcinoma On concurrent chemo and radiation.   Overall tolerating well. Labs reviewed and discussed with patient.  Counts acceptable to proceed with today's carboplatin AUC of 2 and Taxol 45 mg/m.  # no blood return from Mediport per infusion nurse.  Advised nurse to hold chemotherapy and order dye study. 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  Contrast flowed freely into SVC in the right atrium without evidence of extravasation Reschedule patient to get chemotherapy on 06/04/2018.  #Radiation-induced esophagitis, symptoms are stable.  Continue omeprazole and sucralfate.  Magic mouthwash as needed. #Mild anemia and thrombocytopenia, secondary to chemotherapy and radiation.  Continue to monitor.   The patient knows to call the clinic with any problems questions or concerns.   Return of visit: 1 week for evaluation prior to next chemotherapy. We spent sufficient time to discuss many aspect of care, questions were answered to patient's satisfaction. Total face to face encounter time for this patient visit was 25 min. >50% of the time was  spent in counseling and coordination of care.    Earlie Server, MD, PhD Hematology Oncology [facility] Pager- [phone] 06/03/18

## 2018-06-03 NOTE — Telephone Encounter (Signed)
Spoke to [place] in specials regarding Timothy Yoder. Per report: port flushes well, good position, a little bit of debris in the reservoir, but fine to use port. Per Dr. Tasia Catchings, reschedule patient's carbo taxol to tomorrow and move out lab/MD/carbo taxol appt to 2/18.  appoitnments scheduled by [facility] and patient notified.

## 2018-06-04 ENCOUNTER — Ambulatory Visit
Admission: RE | Admit: 2018-06-04 | Discharge: 2018-06-04 | Disposition: A | Discharge status: Home / Self Care | Payer: BLUE CROSS/BLUE SHIELD | Source: Ambulatory Visit | Attending: Radiation Oncology | Admitting: Radiation Oncology

## 2018-06-04 ENCOUNTER — Inpatient Hospital Stay: Payer: BLUE CROSS/BLUE SHIELD

## 2018-06-04 VITALS — BP 116/75 | HR 65 | Temp 97.5°F

## 2018-06-04 DIAGNOSIS — Z510 Encounter for antineoplastic radiation therapy: Secondary | ICD-10-CM | POA: Diagnosis not present

## 2018-06-04 DIAGNOSIS — C3431 Malignant neoplasm of lower lobe, right bronchus or lung: Secondary | ICD-10-CM

## 2018-06-04 MED ORDER — ALTEPLASE 2 MG IJ SOLR
2.0000 mg | Freq: Once | INTRAMUSCULAR | Status: AC | PRN
  Administered 2018-06-04: 2 mg
  Filled 2018-06-04: qty 2

## 2018-06-04 MED ORDER — SODIUM CHLORIDE 0.9 % IV SOLN
Freq: Once | INTRAVENOUS | Status: AC
  Administered 2018-06-04: 12:00:00 via INTRAVENOUS
  Filled 2018-06-04: qty 250

## 2018-06-04 MED ORDER — PALONOSETRON HCL INJECTION 0.25 MG/5ML
0.2500 mg | Freq: Once | INTRAVENOUS | Status: AC
  Administered 2018-06-04: 0.25 mg via INTRAVENOUS
  Filled 2018-06-04: qty 5

## 2018-06-04 MED ORDER — HEPARIN SOD (PORK) LOCK FLUSH 100 UNIT/ML IV SOLN
500.0000 [IU] | Freq: Once | INTRAVENOUS | Status: AC | PRN
  Administered 2018-06-04: 500 [IU]
  Filled 2018-06-04: qty 5

## 2018-06-04 MED ORDER — SODIUM CHLORIDE 0.9 % IV SOLN
230.0000 mg | Freq: Once | INTRAVENOUS | Status: AC
  Administered 2018-06-04: 230 mg via INTRAVENOUS
  Filled 2018-06-04: qty 23

## 2018-06-04 MED ORDER — SODIUM CHLORIDE 0.9 % IV SOLN
45.0000 mg/m2 | Freq: Once | INTRAVENOUS | Status: AC
  Administered 2018-06-04: 84 mg via INTRAVENOUS
  Filled 2018-06-04: qty 14

## 2018-06-04 MED ORDER — SODIUM CHLORIDE 0.9 % IV SOLN
20.0000 mg | Freq: Once | INTRAVENOUS | Status: AC
  Administered 2018-06-04: 20 mg via INTRAVENOUS
  Filled 2018-06-04: qty 2

## 2018-06-04 MED ORDER — DIPHENHYDRAMINE HCL 50 MG/ML IJ SOLN
50.0000 mg | Freq: Once | INTRAMUSCULAR | Status: AC
  Administered 2018-06-04: 50 mg via INTRAVENOUS
  Filled 2018-06-04: qty 1

## 2018-06-04 MED ORDER — FAMOTIDINE IN NACL 20-0.9 MG/50ML-% IV SOLN
20.0000 mg | Freq: Once | INTRAVENOUS | Status: AC
  Administered 2018-06-04: 20 mg via INTRAVENOUS
  Filled 2018-06-04: qty 50

## 2018-06-04 NOTE — Addendum Note (Signed)
Addended by: Earlie Server on: 06/04/2018 09:01 AM   Modules accepted: Orders

## 2018-06-05 ENCOUNTER — Ambulatory Visit
Admission: RE | Admit: 2018-06-05 | Discharge: 2018-06-05 | Disposition: A | Discharge status: Home / Self Care | Payer: BLUE CROSS/BLUE SHIELD | Source: Ambulatory Visit | Attending: Radiation Oncology | Admitting: Radiation Oncology

## 2018-06-05 DIAGNOSIS — Z510 Encounter for antineoplastic radiation therapy: Secondary | ICD-10-CM | POA: Diagnosis not present

## 2018-06-06 ENCOUNTER — Ambulatory Visit
Admission: RE | Admit: 2018-06-06 | Discharge: 2018-06-06 | Disposition: A | Discharge status: Home / Self Care | Payer: BLUE CROSS/BLUE SHIELD | Source: Ambulatory Visit | Attending: Radiation Oncology | Admitting: Radiation Oncology

## 2018-06-06 DIAGNOSIS — Z510 Encounter for antineoplastic radiation therapy: Secondary | ICD-10-CM | POA: Diagnosis not present

## 2018-06-07 ENCOUNTER — Ambulatory Visit
Admission: RE | Admit: 2018-06-07 | Discharge: 2018-06-07 | Disposition: A | Discharge status: Home / Self Care | Payer: BLUE CROSS/BLUE SHIELD | Source: Ambulatory Visit | Attending: Radiation Oncology | Admitting: Radiation Oncology

## 2018-06-07 DIAGNOSIS — Z510 Encounter for antineoplastic radiation therapy: Secondary | ICD-10-CM | POA: Diagnosis not present

## 2018-06-10 ENCOUNTER — Ambulatory Visit: Payer: BLUE CROSS/BLUE SHIELD

## 2018-06-10 ENCOUNTER — Ambulatory Visit
Admission: RE | Admit: 2018-06-10 | Discharge: 2018-06-10 | Disposition: A | Discharge status: Home / Self Care | Payer: BLUE CROSS/BLUE SHIELD | Source: Ambulatory Visit | Attending: Radiation Oncology | Admitting: Radiation Oncology

## 2018-06-10 ENCOUNTER — Ambulatory Visit: Payer: BLUE CROSS/BLUE SHIELD | Admitting: Oncology

## 2018-06-10 ENCOUNTER — Other Ambulatory Visit: Payer: BLUE CROSS/BLUE SHIELD

## 2018-06-10 DIAGNOSIS — Z510 Encounter for antineoplastic radiation therapy: Secondary | ICD-10-CM | POA: Diagnosis not present

## 2018-06-11 ENCOUNTER — Inpatient Hospital Stay: Payer: BLUE CROSS/BLUE SHIELD

## 2018-06-11 ENCOUNTER — Encounter: Payer: Self-pay | Admitting: Oncology

## 2018-06-11 ENCOUNTER — Inpatient Hospital Stay (HOSPITAL_BASED_OUTPATIENT_CLINIC_OR_DEPARTMENT_OTHER): Payer: BLUE CROSS/BLUE SHIELD | Admitting: Oncology

## 2018-06-11 ENCOUNTER — Other Ambulatory Visit: Payer: Self-pay

## 2018-06-11 VITALS — BP 132/89 | HR 76 | Temp 96.2°F | Resp 18 | Wt 153.4 lb

## 2018-06-11 DIAGNOSIS — C3431 Malignant neoplasm of lower lobe, right bronchus or lung: Secondary | ICD-10-CM | POA: Diagnosis present

## 2018-06-11 DIAGNOSIS — K208 Other esophagitis: Secondary | ICD-10-CM | POA: Diagnosis not present

## 2018-06-11 DIAGNOSIS — E119 Type 2 diabetes mellitus without complications: Secondary | ICD-10-CM | POA: Diagnosis not present

## 2018-06-11 DIAGNOSIS — Z5111 Encounter for antineoplastic chemotherapy: Secondary | ICD-10-CM

## 2018-06-11 DIAGNOSIS — D6959 Other secondary thrombocytopenia: Secondary | ICD-10-CM | POA: Insufficient documentation

## 2018-06-11 DIAGNOSIS — D6481 Anemia due to antineoplastic chemotherapy: Secondary | ICD-10-CM | POA: Insufficient documentation

## 2018-06-11 DIAGNOSIS — K5900 Constipation, unspecified: Secondary | ICD-10-CM

## 2018-06-11 DIAGNOSIS — Z452 Encounter for adjustment and management of vascular access device: Secondary | ICD-10-CM | POA: Diagnosis not present

## 2018-06-11 LAB — COMPREHENSIVE METABOLIC PANEL
ALT: 39 U/L (ref 0–44)
AST: 30 U/L (ref 15–41)
Albumin: 3.7 g/dL (ref 3.5–5.0)
Alkaline Phosphatase: 89 U/L (ref 38–126)
Anion gap: 7 (ref 5–15)
BUN: 10 mg/dL (ref 8–23)
CHLORIDE: 107 mmol/L (ref 98–111)
CO2: 26 mmol/L (ref 22–32)
CREATININE: 0.77 mg/dL (ref 0.61–1.24)
Calcium: 8.6 mg/dL — ABNORMAL LOW (ref 8.9–10.3)
GFR calc Af Amer: >60 mL/min (ref >60)
Glucose, Bld: 94 mg/dL (ref 70–99)
Potassium: 3.8 mmol/L (ref 3.5–5.1)
Sodium: 140 mmol/L (ref 135–145)
Total Bilirubin: 0.9 mg/dL (ref 0.3–1.2)
Total Protein: 6.2 g/dL — ABNORMAL LOW (ref 6.5–8.1)

## 2018-06-11 LAB — CBC WITH DIFFERENTIAL/PLATELET
Abs Immature Granulocytes: 0.02 10*3/uL (ref 0.00–0.07)
Basophils Absolute: 0 10*3/uL (ref 0.0–0.1)
Basophils Relative: 1 %
EOS PCT: 0 %
Eosinophils Absolute: 0 10*3/uL (ref 0.0–0.5)
HCT: 35.7 % — ABNORMAL LOW (ref 39.0–52.0)
HEMOGLOBIN: 12.2 g/dL — AB (ref 13.0–17.0)
Immature Granulocytes: 1 %
Lymphocytes Relative: 21 %
Lymphs Abs: 0.5 10*3/uL — ABNORMAL LOW (ref 0.7–4.0)
MCH: 32.7 pg (ref 26.0–34.0)
MCHC: 34.2 g/dL (ref 30.0–36.0)
MCV: 95.7 fL (ref 80.0–100.0)
Monocytes Absolute: 0.2 10*3/uL (ref 0.1–1.0)
Monocytes Relative: 11 %
Neutro Abs: 1.5 10*3/uL — ABNORMAL LOW (ref 1.7–7.7)
Neutrophils Relative %: 66 %
Platelets: 123 10*3/uL — ABNORMAL LOW (ref 150–400)
RBC: 3.73 MIL/uL — ABNORMAL LOW (ref 4.22–5.81)
RDW: 14.7 % (ref 11.5–15.5)
WBC: 2.3 10*3/uL — ABNORMAL LOW (ref 4.0–10.5)
nRBC: 0 % (ref 0.0–0.2)

## 2018-06-11 MED ORDER — SODIUM CHLORIDE 0.9 % IV SOLN
45.0000 mg/m2 | Freq: Once | INTRAVENOUS | Status: AC
  Administered 2018-06-11: 84 mg via INTRAVENOUS
  Filled 2018-06-11: qty 14

## 2018-06-11 MED ORDER — HEPARIN SOD (PORK) LOCK FLUSH 100 UNIT/ML IV SOLN
500.0000 [IU] | Freq: Once | INTRAVENOUS | Status: AC
  Administered 2018-06-11: 500 [IU] via INTRAVENOUS
  Filled 2018-06-11: qty 5

## 2018-06-11 MED ORDER — PALONOSETRON HCL INJECTION 0.25 MG/5ML
0.2500 mg | Freq: Once | INTRAVENOUS | Status: AC
  Administered 2018-06-11: 0.25 mg via INTRAVENOUS
  Filled 2018-06-11: qty 5

## 2018-06-11 MED ORDER — HEPARIN SOD (PORK) LOCK FLUSH 100 UNIT/ML IV SOLN: 500.0000 [IU] | Freq: Once | INTRAVENOUS | Status: DC | PRN

## 2018-06-11 MED ORDER — SODIUM CHLORIDE 0.9 % IV SOLN
20.0000 mg | Freq: Once | INTRAVENOUS | Status: AC
  Administered 2018-06-11: 20 mg via INTRAVENOUS
  Filled 2018-06-11: qty 2

## 2018-06-11 MED ORDER — SODIUM CHLORIDE 0.9 % IV SOLN
Freq: Once | INTRAVENOUS | Status: AC
  Administered 2018-06-11: 11:00:00 via INTRAVENOUS
  Filled 2018-06-11: qty 250

## 2018-06-11 MED ORDER — DIPHENHYDRAMINE HCL 50 MG/ML IJ SOLN
50.0000 mg | Freq: Once | INTRAMUSCULAR | Status: AC
  Administered 2018-06-11: 50 mg via INTRAVENOUS
  Filled 2018-06-11: qty 1

## 2018-06-11 MED ORDER — SODIUM CHLORIDE 0.9% FLUSH
10.0000 mL | Freq: Once | INTRAVENOUS | Status: AC
  Administered 2018-06-11: 10 mL via INTRAVENOUS
  Filled 2018-06-11: qty 10

## 2018-06-11 MED ORDER — FAMOTIDINE IN NACL 20-0.9 MG/50ML-% IV SOLN
20.0000 mg | Freq: Once | INTRAVENOUS | Status: AC
  Administered 2018-06-11: 20 mg via INTRAVENOUS
  Filled 2018-06-11: qty 50

## 2018-06-11 MED ORDER — SODIUM CHLORIDE 0.9 % IV SOLN
230.0000 mg | Freq: Once | INTRAVENOUS | Status: AC
  Administered 2018-06-11: 230 mg via INTRAVENOUS
  Filled 2018-06-11: qty 23

## 2018-06-11 NOTE — Progress Notes (Signed)
Hematology/Oncology follow up note [facility] [phone] [phone]   Patient Care Team: Baxter Hire, MD as PCP - General (Internal Medicine) Sherren Mocha, MD as PCP - Cardiology (Cardiology) Telford Nab, RN as Registered Nurse  REFERRING PROVIDER: Dr.Fleming REASON FOR VISIT:  Follow-up for management of stage III non-small cell lung cancer   HISTORY OF PRESENTING ILLNESS:  Timothy Yoder is a  63 y.o.  male with PMH listed below who was referred to me for evaluation of lung mass.  Patient is a former smoker quit smoking 7 years ago.  40-pack-year. Patient chest lung cancer screening on 03/11/2018. CT scan showed 2.4 cm right lower lobe nodule with a new necrotic.  Subcarinal lymph node. PET scan showed hypermetabolic right lower pulmonary nodule, consistent with primary bronchogenic carcinoma.  Fluid density subcarinal structure demonstrate no significant hypermetabolism.  Although this could represent an incidental benign lesion such as a cyst given the interval development since 02/24/2017, necrotic adenopathy cannot be excluded and tissue sampling should be considered.  Patient was seen by Dr. Faith Rogue for initial evaluation on 03/13/2018.  Patient has not got PET scan at that time. Today patient was accompanied by his wife to the oncology clinic to discuss management plan.  And PET scan results. Patient reports chronic shortness of breath with moderate exertion.  Appetite is good denies any fever, chills, hemoptysis, weight loss.  # underwent biopsy via bronchoscopy.  Subcarinal lymph node biopsy positive for non-small cell lung cancer, favoring poorly differentiated adenocarcinoma. #Mediport was placed by Dr. Genevive Bi   # Cancer Treatment 04/26/2018 -06/11/2018 Concurrent Chemotherapy Daneil Dolin and Taxol] with RT.   INTERVAL HISTORY [name] is a 63 y.o. male who has above history reviewed by me today presents for follow up  visit for management of stage III non-small cell lung cancer.  Patient reports doing well.  Finished radiation yesterday.  Shortness of breath at baseline. No new concerns of Mediport. Radiation-induced esophagitis, takes omeprazole 40 mg twice daily.  Also on sucralfate.  Symptoms stable and improved. Patient very controlled using regimen.   Review of Systems  Constitutional: Positive for fatigue. Negative for appetite change, chills, fever and unexpected weight change.  HENT:   Negative for hearing loss and voice change.   Eyes: Negative for eye problems and icterus.  Respiratory: Negative for chest tightness, cough and shortness of breath.   Cardiovascular: Negative for chest pain and leg swelling.  Gastrointestinal: Negative for abdominal distention and abdominal pain.  Endocrine: Negative for hot flashes.  Genitourinary: Negative for difficulty urinating, dysuria and frequency.   Musculoskeletal: Negative for arthralgias.  Skin: Negative for itching and rash.  Neurological: Negative for light-headedness and numbness.  Hematological: Negative for adenopathy. Does not bruise/bleed easily.  Psychiatric/Behavioral: Negative for confusion.   MEDICAL HISTORY:  Past Medical History:  Diagnosis Date  . BPH (benign prostatic hyperplasia)   . CAD (coronary artery disease)    a. 04/2014 anterolateral STEMI/PCI: LM nl, LAD 24m(2.75x16 Promus DES, D1 50p, LCX nl, RI nl, OM1 100 small/chronic, RCA dominant, 20-33m7047m(FFR 0.90->Med Rx), RPDA 50, RPLA 70, EF 60%.  . CMarland KitchenPD (chronic obstructive pulmonary disease) (HCC)    Dr FleRaul Del Former tobacco use   . Gastroesophageal reflux disease   . HTN (hypertension)   . Hyperlipidemia   . Hypokalemia   . Malignant neoplasm of lung ([place]2/12/2017  . Myocardial infarction ([place]/2016   Dr MicSherren Mocha Stented coronary  artery     SURGICAL HISTORY: Past Surgical History:  Procedure Laterality Date  . CARDIAC CATHETERIZATION    .  CHOLECYSTECTOMY N/A 02/15/2016   Procedure: LAPAROSCOPIC CHOLECYSTECTOMY WITH INTRAOPERATIVE CHOLANGIOGRAM;  Surgeon: Leonie Green, MD;  Location: [facility] ORS;  Service: General;  Laterality: N/A;  . COLONOSCOPY    . COLONOSCOPY WITH PROPOFOL N/A 08/02/2015   Procedure: COLONOSCOPY WITH PROPOFOL;  Surgeon: Manya Silvas, MD;  Location: [facility] ENDOSCOPY;  Service: Endoscopy;  Laterality: N/A;  . CORONARY ANGIOPLASTY  04/2014   STENT  . ENDOBRONCHIAL ULTRASOUND N/A 03/26/2018   Procedure: ENDOBRONCHIAL ULTRASOUND;  Surgeon: Flora Lipps, MD;  Location: [facility] ORS;  Service: Cardiopulmonary;  Laterality: N/A;  . ESOPHAGOGASTRODUODENOSCOPY (EGD) WITH PROPOFOL N/A 08/02/2015   Procedure: ESOPHAGOGASTRODUODENOSCOPY (EGD) WITH PROPOFOL;  Surgeon: Manya Silvas, MD;  Location: [facility] ENDOSCOPY;  Service: Endoscopy;  Laterality: N/A;  . FLEXIBLE BRONCHOSCOPY N/A 03/26/2018   Procedure: FLEXIBLE BRONCHOSCOPY;  Surgeon: Flora Lipps, MD;  Location: [facility] ORS;  Service: Cardiopulmonary;  Laterality: N/A;  . KNEE ARTHROSCOPY Left   . LEFT HEART CATHETERIZATION WITH CORONARY ANGIOGRAM N/A 05/03/2014   Procedure: LEFT HEART CATHETERIZATION WITH CORONARY ANGIOGRAM;  Surgeon: Blane Ohara, MD;  Location: [facility] CATH LAB;  Service: Cardiovascular;  Laterality: N/A;  . PERCUTANEOUS CORONARY STENT INTERVENTION (PCI-S) N/A 05/05/2014   Procedure: PERCUTANEOUS CORONARY STENT INTERVENTION (PCI-S);  Surgeon: Peter M Martinique, MD;  Location: [facility] CATH LAB;  Service: Cardiovascular;  Laterality: N/A;  . PORTACATH PLACEMENT Left 04/04/2018   Procedure: INSERTION PORT-A-CATH;  Surgeon: [name], MD;  Location: [facility] ORS;  Service: General;  Laterality: Left;    SOCIAL HISTORY: Social History   Socioeconomic History  . Marital status: Married    Spouse name: Not on file  . Number of children: Not on file  . Years of education: Not on file  . Highest education level: Not on file  Occupational History  . Not on file    Social Needs  . Financial resource strain: Not on file  . Food insecurity:    Worry: Not on file    Inability: Not on file  . Transportation needs:    Medical: Not on file    Non-medical: Not on file  Tobacco Use  . Smoking status: Former Smoker    Packs/day: 1.00    Years: 40.00    Pack years: 40.00    Types: Cigarettes    Last attempt to quit: 02/14/2011    Years since quitting: 7.3  . Smokeless tobacco: Never Used  Substance and Sexual Activity  . Alcohol use: No  . Drug use: No  . Sexual activity: Not on file  Lifestyle  . Physical activity:    Days per week: Not on file    Minutes per session: Not on file  . Stress: Not on file  Relationships  . Social connections:    Talks on phone: Not on file    Gets together: Not on file    Attends religious service: Not on file    Active member of club or organization: Not on file    Attends meetings of clubs or organizations: Not on file    Relationship status: Not on file  . Intimate partner violence:    Fear of current or ex partner: Not on file    Emotionally abused: Not on file    Physically abused: Not on file    Forced sexual activity: Not on file  Other Topics Concern  . Not on  file  Social History Narrative   The patient is married. He lives in [place]. He works full-time. He quit smoking in 2013.    FAMILY HISTORY: Family History  Problem Relation Age of Onset  . CAD Mother 57       Died of an MI  . CAD Father 66       Died of a massive MI  . COPD Sister   . Lung cancer Brother   . Bone cancer Paternal Uncle   . Colon cancer Neg Hx   . Breast cancer Neg Hx     ALLERGIES:  has No Known Allergies.  MEDICATIONS:  Current Outpatient Medications  Medication Sig Dispense Refill  . acetaminophen (TYLENOL) 500 MG tablet Take 1,000 mg by mouth every 6 (six) hours as needed (for pain.).    Marland Kitchen albuterol (PROAIR HFA) 108 (90 Base) MCG/ACT inhaler Inhale 2 puffs into the lungs every 6 (six) hours as needed  for wheezing or shortness of breath.     Marland Kitchen aspirin EC 81 MG tablet Take 81 mg by mouth daily.    Marland Kitchen atorvastatin (LIPITOR) 80 MG tablet Take 1 tablet (80 mg total) by mouth daily at 6 PM. 90 tablet 3  . carvedilol (COREG) 6.25 MG tablet Take 1 tablet (6.25 mg total) by mouth 2 (two) times daily. 180 tablet 3  . lidocaine-prilocaine (EMLA) cream Apply to affected area once 30 g 3  . lisinopril (PRINIVIL,ZESTRIL) 10 MG tablet Take 1 tablet (10 mg total) by mouth daily. 90 tablet 3  . nitroGLYCERIN (NITROSTAT) 0.4 MG SL tablet Place 1 tablet (0.4 mg total) under the tongue every 5 (five) minutes as needed for chest pain. X 3 doses 25 tablet 3  . omeprazole (PRILOSEC) 40 MG capsule Take 40 mg by mouth 2 (two) times daily.     . ondansetron (ZOFRAN) 8 MG tablet Take 1 tablet (8 mg total) by mouth 2 (two) times daily as needed for refractory nausea / vomiting. Start on day 3 after chemo. 30 tablet 1  . potassium chloride SA (KLOR-CON M20) 20 MEQ tablet Take 1 tablet (20 mEq total) by mouth daily. 90 tablet 1  . prochlorperazine (COMPAZINE) 10 MG tablet Take 1 tablet (10 mg total) by mouth every 6 (six) hours as needed (Nausea or vomiting). 30 tablet 1  . sucralfate (CARAFATE) 1 g tablet Take 1 tablet (1 g total) by mouth 3 (three) times daily before meals. Dissolve in warm water, swish and swallow 90 tablet 6  . tamsulosin (FLOMAX) 0.4 MG CAPS capsule Take 0.4 mg by mouth daily after supper.    . tobramycin-dexamethasone (TOBRADEX) ophthalmic solution INSTILL 2 DROPS IN BOTH EYES EVERY 6 HOURS    . traZODone (DESYREL) 50 MG tablet Take 1 tablet (50 mg total) by mouth at bedtime. 30 tablet 2  . umeclidinium-vilanterol (ANORO ELLIPTA) 62.5-25 MCG/INH AEPB Inhale 1 puff into the lungs every morning.    . colestipol (COLESTID) 1 g tablet Take 2 g by mouth daily as needed (stomach issues).      No current facility-administered medications for this visit.      PHYSICAL EXAMINATION: ECOG PERFORMANCE STATUS:  0 - Asymptomatic Vitals:   06/11/18 0946  BP: 132/89  Pulse: 76  Resp: 18  Temp: (!) 96.2 F (35.7 C)  SpO2: 98%   Filed Weights   06/11/18 0946  Weight: 153 lb 6.4 oz (69.6 kg)    Physical Exam Constitutional:      General: He is  not in acute distress. HENT:     Head: Normocephalic and atraumatic.  Eyes:     General: No scleral icterus.    Pupils: Pupils are equal, round, and reactive to light.  Neck:     Musculoskeletal: Normal range of motion and neck supple.  Cardiovascular:     Rate and Rhythm: Normal rate and regular rhythm.     Heart sounds: Normal heart sounds.  Pulmonary:     Effort: Pulmonary effort is normal. No respiratory distress.     Breath sounds: No wheezing.  Abdominal:     General: Bowel sounds are normal. There is no distension.     Palpations: Abdomen is soft. There is no mass.     Tenderness: There is no abdominal tenderness.  Musculoskeletal: Normal range of motion.        General: No deformity.  Skin:    General: Skin is warm and dry.     Findings: No erythema or rash.  Neurological:     Mental Status: He is alert and oriented to person, place, and time.     Cranial Nerves: No cranial nerve deficit.     Coordination: Coordination normal.  Psychiatric:        Behavior: Behavior normal.        Thought Content: Thought content normal.      LABORATORY DATA:  I have reviewed the data as listed Lab Results  Component Value Date   WBC 2.3 (L) 06/11/2018   HGB 12.2 (L) 06/11/2018   HCT 35.7 (L) 06/11/2018   MCV 95.7 06/11/2018   PLT 123 (L) 06/11/2018   Recent Labs    05/27/18 0811 06/03/18 0827 06/11/18 0922  NA 139 138 140  K 3.7 3.9 3.8  CL 108 108 107  CO2 _0 GLUCOSE 88 114* 94  BUN _1 CREATININE 0.92 0.93 0.77  CALCIUM 8.7* 8.4* 8.6*  GFRNONAA >60 >60 >60  GFRAA >60 >60 >60  PROT 6.3* 6.1* 6.2*  ALBUMIN 3.7 3.5 3.7  AST _2 ALT 29 33 39  ALKPHOS 98 88 89  BILITOT 0.6 0.6 0.9    Iron/TIBC/Ferritin/ %Sat No results found for: IRON, TIBC, FERRITIN, IRONPCTSAT   RADIOGRAPHIC STUDIES: I have personally reviewed the radiological images as listed and agreed with the findings in the report. 03/11/2018 CT chest lung screen 1. New 2.4 cm right lower lobe nodule with a new necrotic appearing subcarinal lymph node. Lung-RADS 4B, suspicious. Additional imaging evaluation or consultation with Pulmonology or Thoracic Surgery recommended. These results will be called to the ordering clinician or representative by the Radiologist Assistant, and communication documented in the PACS or zVision Dashboard. 2. Aortic atherosclerosis (ICD10-170.0). Coronary arterycalcification.3.  Emphysema (ICD10-J43.9). 03/13/2018 PET scan # Hypermetabolic right lower lobe pulmonary nodule, consistent with primary bronchogenic carcinoma. Fluid density subcarinal structure demonstrates no significant hypermetabolism. Although this could represent an incidental benign lesion such as a bronchogenic cyst, given interval development since 02/24/2017, necrotic adenopathy cannot be excluded and tissue sampling should be considered.  03/26/2018 DIAGNOSIS:  A. SUBCARINA, CYSTIC MASS; FNA:  - NON-SMALL CELL CARCINOMA CONSISTENT WITH POORLY DIFFERENTIATED  ADENOCARCINOMA.   MRI brain 04/01/2018  Normal MRI head with and without contrast for age.  No intracranial metastasis.   ASSESSMENT & PLAN:  1. Malignant neoplasm of lower lobe of right lung ([place])   2. Encounter for antineoplastic chemotherapy   Cancer Staging Malignant neoplasm of lower lobe of right lung [facility]) Staging form:  Lung, AJCC 8th Edition - Clinical stage from 05/13/2018: Stage IIIA (cT1c, cN2, cM0) - Signed by Earlie Server, MD on 05/13/2018  #Stage IIIA lung adenocarcinoma Labs reviewed and discussed with patient.  We will proceed with last dose of weekly carboplatin and Taxol today. He has finished concurrent chemoradiation by today. Recommend  repeat CT scan in 4 weeks Follow-up in clinic for MD assessment, labs, prior to starting durvalumab maintenance treatment. #Radiation-induced esophagitis, symptoms are stable.  Continue omeprazole and sucralfate Magic mouthwash as needed  #Mild anemia and thrombocytopenia, secondary to chemotherapy and radiation.  Continue to monitor   The patient knows to call the clinic with any problems questions or concerns.   Return of visit: 4 week for evaluation prior to immunotherapy  Orders Placed This Encounter  Procedures  . CT Chest W Contrast    Standing Status:   Future    Standing Expiration Date:   06/11/2019    Order Specific Question:   ** REASON FOR EXAM (FREE TEXT)    Answer:   s/p chemo/ RT, new baseline prior to immunotherapy    Order Specific Question:   If indicated for the ordered procedure, I authorize the administration of contrast media per Radiology protocol    Answer:   Yes    Order Specific Question:   Preferred imaging location?    Answer:   [facility]    Order Specific Question:   Radiology Contrast Protocol - do NOT remove file path    Answer:   [email or web address]      Earlie Server, MD, PhD Hematology Oncology [facility] Pager- [phone] 06/11/18

## 2018-06-11 NOTE — Progress Notes (Signed)
DISCONTINUE ON PATHWAY REGIMEN - Non-Small Cell Lung     Administer weekly:     Paclitaxel      Carboplatin   **Always confirm dose/schedule in your pharmacy ordering system**  REASON: Other Reason PRIOR TREATMENT: [id number]: Carboplatin AUC=2 + Paclitaxel 45 mg/m2 Weekly During Radiation TREATMENT RESPONSE: Stable Disease (SD)  START ON PATHWAY REGIMEN - Non-Small Cell Lung     A cycle is every 14 days:     Durvalumab   **Always confirm dose/schedule in your pharmacy ordering system**  Patient Characteristics: Stage III - Unresectable, PS = 0, 1 AJCC T Category: T1c Current Disease Status: No Distant Mets or Local Recurrence AJCC N Category: N2 AJCC M Category: M0 AJCC 8 Stage Grouping: IIIA ECOG Performance Status: 0 Intent of Therapy: Curative Intent, Discussed with Patient

## 2018-06-11 NOTE — Progress Notes (Signed)
Patient here for follow up. No concerns voiced.  °

## 2018-06-11 NOTE — Progress Notes (Signed)
ON PATHWAY REGIMEN - Non-Small Cell Lung  No Change  Continue With Treatment as Ordered.     Administer weekly:     Paclitaxel      Carboplatin   **Always confirm dose/schedule in your pharmacy ordering system**  Patient Characteristics: Stage III - Unresectable, PS = 0, 1 AJCC T Category: T1c Current Disease Status: No Distant Mets or Local Recurrence AJCC N Category: N2 AJCC M Category: M0 AJCC 8 Stage Grouping: IIIA Performance Status: PS = 0, 1 Intent of Therapy: Curative Intent, Discussed with Patient

## 2018-06-17 ENCOUNTER — Other Ambulatory Visit: Payer: Self-pay | Admitting: *Deleted

## 2018-06-17 DIAGNOSIS — C3431 Malignant neoplasm of lower lobe, right bronchus or lung: Secondary | ICD-10-CM

## 2018-06-18 ENCOUNTER — Inpatient Hospital Stay: Payer: BLUE CROSS/BLUE SHIELD

## 2018-06-18 DIAGNOSIS — C3431 Malignant neoplasm of lower lobe, right bronchus or lung: Secondary | ICD-10-CM

## 2018-06-18 DIAGNOSIS — Z5111 Encounter for antineoplastic chemotherapy: Secondary | ICD-10-CM | POA: Diagnosis not present

## 2018-06-18 LAB — CBC WITH DIFFERENTIAL/PLATELET
Abs Immature Granulocytes: 0.05 10*3/uL (ref 0.00–0.07)
Basophils Absolute: 0 10*3/uL (ref 0.0–0.1)
Basophils Relative: 1 %
Eosinophils Absolute: 0 10*3/uL (ref 0.0–0.5)
Eosinophils Relative: 1 %
HEMATOCRIT: 34.3 % — AB (ref 39.0–52.0)
HEMOGLOBIN: 12.1 g/dL — AB (ref 13.0–17.0)
Immature Granulocytes: 3 %
Lymphocytes Relative: 34 %
Lymphs Abs: 0.6 10*3/uL — ABNORMAL LOW (ref 0.7–4.0)
MCH: 33.6 pg (ref 26.0–34.0)
MCHC: 35.3 g/dL (ref 30.0–36.0)
MCV: 95.3 fL (ref 80.0–100.0)
MONOS PCT: 17 %
Monocytes Absolute: 0.3 10*3/uL (ref 0.1–1.0)
Neutro Abs: 0.8 10*3/uL — ABNORMAL LOW (ref 1.7–7.7)
Neutrophils Relative %: 44 %
Platelets: 139 10*3/uL — ABNORMAL LOW (ref 150–400)
RBC: 3.6 MIL/uL — ABNORMAL LOW (ref 4.22–5.81)
RDW: 14.8 % (ref 11.5–15.5)
WBC: 1.8 10*3/uL — ABNORMAL LOW (ref 4.0–10.5)
nRBC: 0 % (ref 0.0–0.2)

## 2018-06-18 LAB — COMPREHENSIVE METABOLIC PANEL
ALT: 43 U/L (ref 0–44)
AST: 34 U/L (ref 15–41)
Albumin: 3.9 g/dL (ref 3.5–5.0)
Alkaline Phosphatase: 102 U/L (ref 38–126)
Anion gap: 6 (ref 5–15)
BUN: 15 mg/dL (ref 8–23)
CO2: 27 mmol/L (ref 22–32)
CREATININE: 1.03 mg/dL (ref 0.61–1.24)
Calcium: 8.6 mg/dL — ABNORMAL LOW (ref 8.9–10.3)
Chloride: 102 mmol/L (ref 98–111)
GFR calc Af Amer: >60 mL/min (ref >60)
GFR calc non Af Amer: >60 mL/min (ref >60)
Glucose, Bld: 96 mg/dL (ref 70–99)
Potassium: 3.7 mmol/L (ref 3.5–5.1)
Sodium: 135 mmol/L (ref 135–145)
Total Bilirubin: 0.8 mg/dL (ref 0.3–1.2)
Total Protein: 6.5 g/dL (ref 6.5–8.1)

## 2018-06-20 ENCOUNTER — Encounter: Payer: Self-pay | Admitting: Oncology

## 2018-06-22 ENCOUNTER — Other Ambulatory Visit: Payer: Self-pay | Admitting: Oncology

## 2018-06-25 ENCOUNTER — Telehealth: Payer: Self-pay | Admitting: Oncology

## 2018-06-25 NOTE — Telephone Encounter (Signed)
Timothy Yoder contacted to obtain verbal, telephone consent to share their name and contact information with [place] Scientist, product/process development and team) for purposes of soliciting patient experience feedback.  Verbal consent obtained and documented on "[place] [facility] [place] INFORMATION" form.  Timothy Yoder is aware that [place] will be in contact with them at a future date for screening purposes for interviews.  Please direct questions related to this process to Timothy Yoder via email at [email or web address] or extension [phone].

## 2018-07-01 ENCOUNTER — Ambulatory Visit: Payer: BLUE CROSS/BLUE SHIELD | Admitting: Cardiovascular Disease

## 2018-07-01 ENCOUNTER — Other Ambulatory Visit: Payer: Self-pay

## 2018-07-01 ENCOUNTER — Inpatient Hospital Stay: Payer: BLUE CROSS/BLUE SHIELD | Attending: Oncology

## 2018-07-01 DIAGNOSIS — C3431 Malignant neoplasm of lower lobe, right bronchus or lung: Secondary | ICD-10-CM | POA: Insufficient documentation

## 2018-07-01 DIAGNOSIS — Z5112 Encounter for antineoplastic immunotherapy: Secondary | ICD-10-CM | POA: Diagnosis not present

## 2018-07-01 DIAGNOSIS — Z79899 Other long term (current) drug therapy: Secondary | ICD-10-CM | POA: Insufficient documentation

## 2018-07-01 LAB — COMPREHENSIVE METABOLIC PANEL
ALT: 48 U/L — ABNORMAL HIGH (ref 0–44)
ANION GAP: 6 (ref 5–15)
AST: 39 U/L (ref 15–41)
Albumin: 3.8 g/dL (ref 3.5–5.0)
Alkaline Phosphatase: 120 U/L (ref 38–126)
BUN: 7 mg/dL — ABNORMAL LOW (ref 8–23)
CO2: 27 mmol/L (ref 22–32)
Calcium: 8.8 mg/dL — ABNORMAL LOW (ref 8.9–10.3)
Chloride: 107 mmol/L (ref 98–111)
Creatinine, Ser: 1.01 mg/dL (ref 0.61–1.24)
GFR calc Af Amer: >60 mL/min (ref >60)
GFR calc non Af Amer: >60 mL/min (ref >60)
GLUCOSE: 103 mg/dL — AB (ref 70–99)
Potassium: 4 mmol/L (ref 3.5–5.1)
Sodium: 140 mmol/L (ref 135–145)
TOTAL PROTEIN: 6.6 g/dL (ref 6.5–8.1)
Total Bilirubin: 0.7 mg/dL (ref 0.3–1.2)

## 2018-07-01 LAB — CBC WITH DIFFERENTIAL/PLATELET
ABS IMMATURE GRANULOCYTES: 0.05 10*3/uL (ref 0.00–0.07)
BASOS PCT: 1 %
Basophils Absolute: 0 10*3/uL (ref 0.0–0.1)
Eosinophils Absolute: 0 10*3/uL (ref 0.0–0.5)
Eosinophils Relative: 1 %
HCT: 37.6 % — ABNORMAL LOW (ref 39.0–52.0)
Hemoglobin: 12.5 g/dL — ABNORMAL LOW (ref 13.0–17.0)
Immature Granulocytes: 2 %
Lymphocytes Relative: 23 %
Lymphs Abs: 0.7 10*3/uL (ref 0.7–4.0)
MCH: 33.5 pg (ref 26.0–34.0)
MCHC: 33.2 g/dL (ref 30.0–36.0)
MCV: 100.8 fL — ABNORMAL HIGH (ref 80.0–100.0)
Monocytes Absolute: 0.6 10*3/uL (ref 0.1–1.0)
Monocytes Relative: 19 %
NEUTROS ABS: 1.7 10*3/uL (ref 1.7–7.7)
Neutrophils Relative %: 54 %
PLATELETS: 163 10*3/uL (ref 150–400)
RBC: 3.73 MIL/uL — ABNORMAL LOW (ref 4.22–5.81)
RDW: 17.2 % — ABNORMAL HIGH (ref 11.5–15.5)
WBC: 3 10*3/uL — ABNORMAL LOW (ref 4.0–10.5)
nRBC: 0 % (ref 0.0–0.2)

## 2018-07-03 ENCOUNTER — Encounter: Payer: Self-pay | Admitting: Oncology

## 2018-07-03 ENCOUNTER — Telehealth: Payer: Self-pay | Admitting: *Deleted

## 2018-07-03 NOTE — Telephone Encounter (Signed)
Ok to release labs. Counts are better comparing to last visit.

## 2018-07-03 NOTE — Telephone Encounter (Signed)
Patient called requesting lab results.  ...   Ref Range & Units 2d ago (07/01/18) 2wk ago (06/18/18) 3wk ago (06/11/18) 34mo ago (06/03/18) 35mo ago (05/27/18) 67mo ago (05/20/18) 67mo ago (05/13/18)  Sodium 135 - 145 mmol/L 140  135  140  138  139  138  139   Potassium 3.5 - 5.1 mmol/L 4.0  3.7  3.8  3.9  3.7  3.6  3.9   Chloride 98 - 111 mmol/L 107  102  107  108  108  108  108   CO2 22 - 32 mmol/L 27  27  26  26  26  25  26    Glucose, Bld 70 - 99 mg/dL 103High   96  94  114High   88  82  100High    BUN 8 - 23 mg/dL 7Low   15  10  13  14  12  14    Creatinine, Ser 0.61 - 1.24 mg/dL 1.01  1.03  0.77  0.93  0.92  0.92  0.84   Calcium 8.9 - 10.3 mg/dL 8.8Low   8.6Low   8.6Low   8.4Low   8.7Low   8.7Low   8.4Low    Total Protein 6.5 - 8.1 g/dL 6.6  6.5  6.2Low   6.1Low   6.3Low   6.4Low   6.2Low    Albumin 3.5 - 5.0 g/dL 3.8  3.9  3.7  3.5  3.7  3.7  3.6   AST 15 - 41 U/L 39  34  30  27  26  25  19    ALT 0 - 44 U/L 48High   43  39  33  29  29  20    Alkaline Phosphatase 38 - 126 U/L 120  102  89  88  98  101  109   Total Bilirubin 0.3 - 1.2 mg/dL 0.7  0.8  0.9  0.6  0.6  0.6  0.4   GFR calc non Af Amer >60 mL/min >60  >60  >60  >60  >60  >60  >60   GFR calc Af Amer >60 mL/min >60  >60  >60  >60  >60  >60  >60   Anion gap 5 - 15 6  6  CM 7 CM 4Low  CM 5 CM 5 CM 5 CM  Comment: Performed at [facility], [address] [place], [place] [address]  Resulting Agency  [facility] CLIN LAB [place] CLIN LAB [place] CLIN LAB [place] CLIN LAB [place] CLIN LAB [place] CL[facility] LAB [place] [facility] LAB      Specimen Collected: 07/01/18 13:16  Last Resulted: 07/01/18 13:37     Lab Flowsheet    Order Details    View Encounter    Lab and Collection Details    Routing    Result History      CM=Additional comments      Other Results from 07/01/2018   Contains abnormal data CBC with Differential/Platelet  Order: [id number]   Status:  Final result  Visible to patient:  No (Not Released)  Next appt:  07/05/2018 at 10:00 AM in Radiology  ([facility])  Dx:  Malignant neoplasm of lower lobe of r...   Ref Range & Units 2d ago (07/01/18) 2wk ago (06/18/18) 3wk ago (06/11/18) 47mo ago (06/03/18) 79mo ago (05/27/18) 58mo ago (05/20/18) 58mo ago (05/13/18)  WBC 4.0 - 10.5 K/uL 3.0Low   1.8Low   2.3Low   3.3Low   3.8Low   4.7  3.5Low  RBC 4.22 - 5.81 MIL/uL 3.73Low   3.60Low   3.73Low   3.83Low   3.98Low   4.22  4.06Low    Hemoglobin 13.0 - 17.0 g/dL 12.5Low   12.1Low   12.2Low   12.4Low   12.9Low   13.4  12.8Low    HCT 39.0 - 52.0 % 37.6Low   34.3Low   35.7Low   36.3Low   37.2Low   39.7  38.6Low    MCV 80.0 - 100.0 fL 100.8High   95.3  95.7  94.8  93.5  94.1  95.1   MCH 26.0 - 34.0 pg 33.5  33.6  32.7  32.4  32.4  31.8  31.5   MCHC 30.0 - 36.0 g/dL 33.2  35.3  34.2  34.2  34.7  33.8  33.2   RDW 11.5 - 15.5 % 17.2High   14.8  14.7  14.1  13.2  13.1  13.0   Platelets 150 - 400 K/uL 163  139Low   123Low   142Low   145Low   181  168   nRBC 0.0 - 0.2 % 0.0  0.0  0.0  0.0  0.0  0.0  0.0   Neutrophils Relative % % 54  44  66  70  67  72  66   Neutro Abs 1.7 - 7.7 K/uL 1.7  0.8Low   1.5Low   2.3  2.6  3.4  2.3   Lymphocytes Relative % 23  34  21  16  18  15  21    Lymphs Abs 0.7 - 4.0 K/uL 0.7  0.6Low   0.5Low   0.5Low   0.7  0.7  0.8   Monocytes Relative % 19  17  11  12  11  9  9    Monocytes Absolute 0.1 - 1.0 K/uL 0.6  0.3  0.2  0.4  0.4  0.4  0.3   Eosinophils Relative % 1  1  0  0  1  1  1    Eosinophils Absolute 0.0 - 0.5 K/uL 0.0  0.0  0.0  0.0  0.0  0.0  0.1   Basophils Relative % 1  1  1  1  1  1  1    Basophils Absolute 0.0 - 0.1 K/uL 0.0  0.0  0.0  0.0  0.0  0.0  0.0   Immature Granulocytes % 2  3  1  1  2  2  2    Abs Immature Granulocytes 0.00 - 0.07 K/uL 0.05  0.05 CM 0.02 CM 0.04 CM 0.06 CM 0.07 CM 0.06 CM  Comment: Performed at [facility], [address] [place], [place] [address]  Resulting Agency  [facility] CLIN LAB [place] CLIN LAB [place] CLIN LAB [place] CLIN LAB [place] CL[facility] LAB [place] [facility] LAB [place] CL[facility] LAB      Specimen Collected: 07/01/18  13:16  Last Resulted: 07/01/18 13:26

## 2018-07-03 NOTE — Telephone Encounter (Signed)
Patient informed of physician response 

## 2018-07-04 NOTE — Telephone Encounter (Signed)
Lab results released

## 2018-07-05 ENCOUNTER — Other Ambulatory Visit: Payer: Self-pay

## 2018-07-05 ENCOUNTER — Ambulatory Visit
Admission: RE | Admit: 2018-07-05 | Discharge: 2018-07-05 | Disposition: A | Discharge status: Home / Self Care | Payer: BLUE CROSS/BLUE SHIELD | Source: Ambulatory Visit | Attending: Oncology | Admitting: Oncology

## 2018-07-05 DIAGNOSIS — Z5111 Encounter for antineoplastic chemotherapy: Secondary | ICD-10-CM

## 2018-07-05 DIAGNOSIS — C3431 Malignant neoplasm of lower lobe, right bronchus or lung: Secondary | ICD-10-CM | POA: Diagnosis not present

## 2018-07-05 MED ORDER — IOHEXOL 300 MG/ML  SOLN
75.0000 mL | Freq: Once | INTRAMUSCULAR | Status: AC | PRN
  Administered 2018-07-05: 75 mL via INTRAVENOUS

## 2018-07-10 ENCOUNTER — Other Ambulatory Visit: Payer: Self-pay | Admitting: *Deleted

## 2018-07-10 ENCOUNTER — Inpatient Hospital Stay (HOSPITAL_BASED_OUTPATIENT_CLINIC_OR_DEPARTMENT_OTHER): Payer: BLUE CROSS/BLUE SHIELD | Admitting: Oncology

## 2018-07-10 ENCOUNTER — Inpatient Hospital Stay: Payer: BLUE CROSS/BLUE SHIELD

## 2018-07-10 ENCOUNTER — Encounter: Payer: Self-pay | Admitting: Oncology

## 2018-07-10 ENCOUNTER — Other Ambulatory Visit: Payer: Self-pay

## 2018-07-10 VITALS — BP 130/80 | HR 68 | Temp 96.6°F | Resp 18 | Wt 151.3 lb

## 2018-07-10 DIAGNOSIS — C3431 Malignant neoplasm of lower lobe, right bronchus or lung: Secondary | ICD-10-CM

## 2018-07-10 DIAGNOSIS — Z79899 Other long term (current) drug therapy: Secondary | ICD-10-CM | POA: Diagnosis not present

## 2018-07-10 DIAGNOSIS — Z5112 Encounter for antineoplastic immunotherapy: Secondary | ICD-10-CM | POA: Diagnosis not present

## 2018-07-10 LAB — COMPREHENSIVE METABOLIC PANEL
ALT: 38 U/L (ref 0–44)
AST: 31 U/L (ref 15–41)
Albumin: 3.8 g/dL (ref 3.5–5.0)
Alkaline Phosphatase: 137 U/L — ABNORMAL HIGH (ref 38–126)
Anion gap: 5 (ref 5–15)
BILIRUBIN TOTAL: 0.8 mg/dL (ref 0.3–1.2)
BUN: 9 mg/dL (ref 8–23)
CO2: 25 mmol/L (ref 22–32)
CREATININE: 0.79 mg/dL (ref 0.61–1.24)
Calcium: 9 mg/dL (ref 8.9–10.3)
Chloride: 109 mmol/L (ref 98–111)
GFR calc Af Amer: >60 mL/min (ref >60)
GFR calc non Af Amer: >60 mL/min (ref >60)
Glucose, Bld: 84 mg/dL (ref 70–99)
Potassium: 3.9 mmol/L (ref 3.5–5.1)
Sodium: 139 mmol/L (ref 135–145)
Total Protein: 6.7 g/dL (ref 6.5–8.1)

## 2018-07-10 LAB — CBC WITH DIFFERENTIAL/PLATELET
Abs Immature Granulocytes: 0.07 10*3/uL (ref 0.00–0.07)
Basophils Absolute: 0 10*3/uL (ref 0.0–0.1)
Basophils Relative: 1 %
EOS PCT: 1 %
Eosinophils Absolute: 0 10*3/uL (ref 0.0–0.5)
HCT: 38.9 % — ABNORMAL LOW (ref 39.0–52.0)
Hemoglobin: 13.3 g/dL (ref 13.0–17.0)
Immature Granulocytes: 1 %
Lymphocytes Relative: 17 %
Lymphs Abs: 0.8 10*3/uL (ref 0.7–4.0)
MCH: 33.9 pg (ref 26.0–34.0)
MCHC: 34.2 g/dL (ref 30.0–36.0)
MCV: 99.2 fL (ref 80.0–100.0)
MONOS PCT: 14 %
Monocytes Absolute: 0.7 10*3/uL (ref 0.1–1.0)
Neutro Abs: 3.3 10*3/uL (ref 1.7–7.7)
Neutrophils Relative %: 66 %
Platelets: 171 10*3/uL (ref 150–400)
RBC: 3.92 MIL/uL — ABNORMAL LOW (ref 4.22–5.81)
RDW: 16.7 % — ABNORMAL HIGH (ref 11.5–15.5)
WBC: 4.9 10*3/uL (ref 4.0–10.5)
nRBC: 0 % (ref 0.0–0.2)

## 2018-07-10 LAB — TSH: TSH: 2.07 u[IU]/mL (ref 0.350–4.500)

## 2018-07-10 MED ORDER — SODIUM CHLORIDE 0.9 % IV SOLN
Freq: Once | INTRAVENOUS | Status: AC
  Administered 2018-07-10: 10:00:00 via INTRAVENOUS
  Filled 2018-07-10: qty 250

## 2018-07-10 MED ORDER — SODIUM CHLORIDE 0.9% FLUSH
10.0000 mL | INTRAVENOUS | Status: DC | PRN
  Administered 2018-07-10: 10 mL via INTRAVENOUS
  Filled 2018-07-10: qty 10

## 2018-07-10 MED ORDER — HEPARIN SOD (PORK) LOCK FLUSH 100 UNIT/ML IV SOLN: 500.0000 [IU] | Freq: Once | INTRAVENOUS | Status: DC | PRN

## 2018-07-10 MED ORDER — SODIUM CHLORIDE 0.9 % IV SOLN: 10.0000 mg/kg | Freq: Once | INTRAVENOUS | Status: DC

## 2018-07-10 MED ORDER — HEPARIN SOD (PORK) LOCK FLUSH 100 UNIT/ML IV SOLN
500.0000 [IU] | Freq: Once | INTRAVENOUS | Status: AC
  Administered 2018-07-10: 500 [IU] via INTRAVENOUS

## 2018-07-10 MED ORDER — SODIUM CHLORIDE 0.9 % IV SOLN
740.0000 mg | Freq: Once | INTRAVENOUS | Status: AC
  Administered 2018-07-10: 740 mg via INTRAVENOUS
  Filled 2018-07-10: qty 4.8

## 2018-07-10 NOTE — Research (Signed)
Human Biospecimens for the Discovery and Validation of Biomarkers for the Prediction, Diagnosis and Management of Disease  [id number] - [id number] [name], MD Attending: Yu Visit: Consent  [place] Patient and wife had the opportunity to ask questions. [facility] Consenting personnel has reviewed consent in entirety with patient.Discussion included, but not limited to: protocol expectations/evaluations, side effects, risks and benefits of the procedures associated with the study. [place] Patient verbalizes good understanding of all instructions and information inconsent, including the voluntary nature of participation, and agrees to complywith all protocol requirements. [place] The patient wishes to participate in this clinical trial and willingly signedthe consent document. [place] Copy of signed consent was given to the patient.  [place] No study-specific procedures were done prior to consent. Any study specifictests/procedures will now be scheduled and completed per protocol for screeningpurposes. [place] Patient signed the ICF (IRB approved 11/08/2017), HIPPA (IRB approved 10/15/2017) forms on 05/17/2018 [facility] The patient was provided with the contact information for the PI, research associate and the IRB if they have any research related questions [facility] The attending physician was notified that the patient consented to the study.  Patient gave blood to complete study requirements. He was provided gift card provided by [facility].  I thanked patient for his participation.   Lula Olszewski Oncology Research Assistant 07/10/2018 10:11 AM

## 2018-07-10 NOTE — Progress Notes (Signed)
Patient here for follow up. Pt has numbness and tingling to left arm and hand; this "pain comes and goes". Pt was having problem with nose bleeds but they have resolved.

## 2018-07-10 NOTE — Progress Notes (Signed)
Hematology/Oncology follow up note [facility] [phone] [phone]   Patient Care Team: Baxter Hire, MD as PCP - General (Internal Medicine) Sherren Mocha, MD as PCP - Cardiology (Cardiology) Telford Nab, RN as Registered Nurse  REFERRING PROVIDER: Dr.Fleming REASON FOR VISIT:  Follow-up for management of stage III non-small cell lung cancer   HISTORY OF PRESENTING ILLNESS:  Timothy Yoder is a  63 y.o.  male with PMH listed below who was referred to me for evaluation of lung mass.  Patient is a former smoker quit smoking 7 years ago.  40-pack-year. Patient chest lung cancer screening on 03/11/2018. CT scan showed 2.4 cm right lower lobe nodule with a new necrotic.  Subcarinal lymph node. PET scan showed hypermetabolic right lower pulmonary nodule, consistent with primary bronchogenic carcinoma.  Fluid density subcarinal structure demonstrate no significant hypermetabolism.  Although this could represent an incidental benign lesion such as a cyst given the interval development since 02/24/2017, necrotic adenopathy cannot be excluded and tissue sampling should be considered.  Patient was seen by Dr. Faith Rogue for initial evaluation on 03/13/2018.  Patient has not got PET scan at that time. Today patient was accompanied by his wife to the oncology clinic to discuss management plan.  And PET scan results. Patient reports chronic shortness of breath with moderate exertion.  Appetite is good denies any fever, chills, hemoptysis, weight loss.  # underwent biopsy via bronchoscopy.  Subcarinal lymph node biopsy positive for non-small cell lung cancer, favoring poorly differentiated adenocarcinoma. #Mediport was placed by Dr. Genevive Bi   # Cancer Treatment 04/26/2018 -06/11/2018 Concurrent Chemotherapy Daneil Dolin and Taxol] with RT.   INTERVAL HISTORY [name] is a 63 y.o. male who has above history reviewed by me today presents for follow up  visit for management of stage III non-small cell lung cancer.  Patient has completed concurrent chemo and radiation. Patient had CT chest done and present to discuss image results and evaluation prior to maintenance immunotherapy treatments. He reports doing well at baseline. Has mild intermittent numbness and tingling of his hands. Acid reflux/indigestion has improved.  Patient is taking omeprazole 40 mg twice daily.   Review of Systems  Constitutional: Positive for fatigue. Negative for appetite change, chills, fever and unexpected weight change.  HENT:   Negative for hearing loss and voice change.   Eyes: Negative for eye problems and icterus.  Respiratory: Negative for chest tightness, cough and shortness of breath.   Cardiovascular: Negative for chest pain and leg swelling.  Gastrointestinal: Negative for abdominal distention and abdominal pain.  Endocrine: Negative for hot flashes.  Genitourinary: Negative for difficulty urinating, dysuria and frequency.   Musculoskeletal: Negative for arthralgias.  Skin: Negative for itching and rash.  Neurological: Negative for light-headedness and numbness.  Hematological: Negative for adenopathy. Does not bruise/bleed easily.  Psychiatric/Behavioral: Negative for confusion.   MEDICAL HISTORY:  Past Medical History:  Diagnosis Date  . BPH (benign prostatic hyperplasia)   . CAD (coronary artery disease)    a. 04/2014 anterolateral STEMI/PCI: LM nl, LAD 53m(2.75x16 Promus DES, D1 50p, LCX nl, RI nl, OM1 100 small/chronic, RCA dominant, 20-31m7050m(FFR 0.90->Med Rx), RPDA 50, RPLA 70, EF 60%.  . CMarland KitchenPD (chronic obstructive pulmonary disease) (HCC)    Dr FleRaul Del Former tobacco use   . Gastroesophageal reflux disease   . HTN (hypertension)   . Hyperlipidemia   . Hypokalemia   . Malignant [place] of lung [place]  . Myocardial  infarction ([place]) 04/2014   Dr Sherren Mocha  . Stented coronary artery     SURGICAL HISTORY: Past  Surgical History:  Procedure Laterality Date  . CARDIAC CATHETERIZATION    . CHOLECYSTECTOMY N/A 02/15/2016   Procedure: LAPAROSCOPIC CHOLECYSTECTOMY WITH INTRAOPERATIVE CHOLANGIOGRAM;  Surgeon: Leonie Green, MD;  Location: [facility] ORS;  Service: General;  Laterality: N/A;  . COLONOSCOPY    . COLONOSCOPY WITH PROPOFOL N/A 08/02/2015   Procedure: COLONOSCOPY WITH PROPOFOL;  Surgeon: Manya Silvas, MD;  Location: [facility] ENDOSCOPY;  Service: Endoscopy;  Laterality: N/A;  . CORONARY ANGIOPLASTY  04/2014   STENT  . ENDOBRONCHIAL ULTRASOUND N/A 03/26/2018   Procedure: ENDOBRONCHIAL ULTRASOUND;  Surgeon: Flora Lipps, MD;  Location: [facility] ORS;  Service: Cardiopulmonary;  Laterality: N/A;  . ESOPHAGOGASTRODUODENOSCOPY (EGD) WITH PROPOFOL N/A 08/02/2015   Procedure: ESOPHAGOGASTRODUODENOSCOPY (EGD) WITH PROPOFOL;  Surgeon: Manya Silvas, MD;  Location[facility] ENDOSCOPY;  Service: Endoscopy;  Laterality: N/A;  . FLEXIBLE BRONCHOSCOPY N/A 03/26/2018   Procedure: FLEXIBLE BRONCHOSCOPY;  Surgeon: Flora Lipps, MD;  Location: [facility] ORS;  Service: Cardiopulmonary;  Laterality: N/A;  . KNEE ARTHROSCOPY Left   . LEFT HEART CATHETERIZATION WITH CORONARY ANGIOGRAM N/A 05/03/2014   Procedure: LEFT HEART CATHETERIZATION WITH CORONARY ANGIOGRAM;  Surgeon: Blane Ohara, MD;  Location: [facility] CATH LAB;  Service: Cardiovascular;  Laterality: N/A;  . PERCUTANEOUS CORONARY STENT INTERVENTION (PCI-S) N/A 05/05/2014   Procedure: PERCUTANEOUS CORONARY STENT INTERVENTION (PCI-S);  Surgeon: Peter M Martinique, MD;  Location: [facility] CATH LAB;  Service: Cardiovascular;  Laterality: N/A;  . PORTACATH PLACEMENT Left 04/04/2018   Procedure: INSERTION PORT-A-CATH;  Surgeon: [name], MD;  Location: [facility] ORS;  Service: General;  Laterality: Left;    SOCIAL HISTORY: Social History   Socioeconomic History  . Marital status: Married    Spouse name: Not on file  . Number of children: Not on file  . Years of education: Not on file   . Highest education level: Not on file  Occupational History  . Not on file  Social Needs  . Financial resource strain: Not on file  . Food insecurity:    Worry: Not on file    Inability: Not on file  . Transportation needs:    Medical: Not on file    Non-medical: Not on file  Tobacco Use  . Smoking status: Former Smoker    Packs/day: 1.00    Years: 40.00    Pack years: 40.00    Types: Cigarettes    Last attempt to quit: 02/14/2011    Years since quitting: 7.4  . Smokeless tobacco: Never Used  Substance and Sexual Activity  . Alcohol use: No  . Drug use: No  . Sexual activity: Not on file  Lifestyle  . Physical activity:    Days per week: Not on file    Minutes per session: Not on file  . Stress: Not on file  Relationships  . Social connections:    Talks on phone: Not on file    Gets together: Not on file    Attends religious service: Not on file    Active member of club or organization: Not on file    Attends meetings of clubs or organizations: Not on file    Relationship status: Not on file  . Intimate partner violence:    Fear of current or ex partner: Not on file    Emotionally abused: Not on file    Physically abused: Not on file    Forced sexual activity:  Not on file  Other Topics Concern  . Not on file  Social History Narrative   The patient is married. He lives in [place]. He works full-time. He quit smoking in 2013.    FAMILY HISTORY: Family History  Problem Relation Age of Onset  . CAD Mother 43       Died of an MI  . CAD Father 67       Died of a massive MI  . COPD Sister   . Lung cancer Brother   . Bone cancer Paternal Uncle   . Colon cancer Neg Hx   . Breast cancer Neg Hx     ALLERGIES:  has No Known Allergies.  MEDICATIONS:  Current Outpatient Medications  Medication Sig Dispense Refill  . acetaminophen (TYLENOL) 500 MG tablet Take 1,000 mg by mouth every 6 (six) hours as needed (for pain.).    Marland Kitchen albuterol (PROAIR HFA) 108 (90  Base) MCG/ACT inhaler Inhale 2 puffs into the lungs every 6 (six) hours as needed for wheezing or shortness of breath.     Marland Kitchen aspirin EC 81 MG tablet Take 81 mg by mouth daily.    Marland Kitchen atorvastatin (LIPITOR) 80 MG tablet Take 1 tablet (80 mg total) by mouth daily at 6 PM. 90 tablet 3  . lisinopril (PRINIVIL,ZESTRIL) 10 MG tablet Take 1 tablet (10 mg total) by mouth daily. 90 tablet 3  . nitroGLYCERIN (NITROSTAT) 0.4 MG SL tablet Place 1 tablet (0.4 mg total) under the tongue every 5 (five) minutes as needed for chest pain. X 3 doses 25 tablet 3  . omeprazole (PRILOSEC) 40 MG capsule Take 40 mg by mouth 2 (two) times daily.     . potassium chloride SA (KLOR-CON M20) 20 MEQ tablet Take 1 tablet (20 mEq total) by mouth daily. 90 tablet 1  . sucralfate (CARAFATE) 1 g tablet Take 1 tablet (1 g total) by mouth 3 (three) times daily before meals. Dissolve in warm water, swish and swallow 90 tablet 6  . tamsulosin (FLOMAX) 0.4 MG CAPS capsule Take 0.4 mg by mouth daily after supper.    . traZODone (DESYREL) 50 MG tablet Take 1 tablet (50 mg total) by mouth at bedtime. 90 tablet 0  . umeclidinium-vilanterol (ANORO ELLIPTA) 62.5-25 MCG/INH AEPB Inhale 1 puff into the lungs every morning.    . carvedilol (COREG) 6.25 MG tablet Take 1 tablet (6.25 mg total) by mouth 2 (two) times daily. 180 tablet 3  . colestipol (COLESTID) 1 g tablet Take 2 g by mouth daily as needed (stomach issues).      No current facility-administered medications for this visit.    Facility-Administered Medications Ordered in Other Visits  Medication Dose Route Frequency Provider Last Rate Last Dose  . heparin lock flush 100 unit/mL  500 Units Intracatheter Once PRN Earlie Server, MD      . sodium chloride flush (NS) 0.9 % injection 10 mL  10 mL Intravenous PRN Earlie Server, MD   10 mL at 07/10/18 0851     PHYSICAL EXAMINATION: ECOG PERFORMANCE STATUS: 0 - Asymptomatic Vitals:   07/10/18 0907  BP: 130/80  Pulse: 68  Resp: 18  Temp: (!)  96.6 F (35.9 C)  SpO2: 98%   Filed Weights   07/10/18 0907  Weight: 151 lb 4.8 oz (68.6 kg)    Physical Exam Constitutional:      General: He is not in acute distress. HENT:     Head: Normocephalic and atraumatic.  Eyes:  General: No scleral icterus.    Pupils: Pupils are equal, round, and reactive to light.  Neck:     Musculoskeletal: Normal range of motion and neck supple.  Cardiovascular:     Rate and Rhythm: Normal rate and regular rhythm.     Heart sounds: Normal heart sounds.  Pulmonary:     Effort: Pulmonary effort is normal. No respiratory distress.     Breath sounds: No wheezing.  Abdominal:     General: Bowel sounds are normal. There is no distension.     Palpations: Abdomen is soft. There is no mass.     Tenderness: There is no abdominal tenderness.  Musculoskeletal: Normal range of motion.        General: No deformity.  Skin:    General: Skin is warm and dry.     Findings: No erythema or rash.  Neurological:     Mental Status: He is alert and oriented to person, place, and time.     Cranial Nerves: No cranial nerve deficit.     Coordination: Coordination normal.  Psychiatric:        Behavior: Behavior normal.        Thought Content: Thought content normal.      LABORATORY DATA:  I have reviewed the data as listed Lab Results  Component Value Date   WBC 4.9 07/10/2018   HGB 13.3 07/10/2018   HCT 38.9 (L) 07/10/2018   MCV 99.2 07/10/2018   PLT 171 07/10/2018   Recent Labs    06/18/18 1126 07/01/18 1316 07/10/18 0844  NA 135 140 139  K 3.7 4.0 3.9  CL 102 107 109  CO2 _0 GLUCOSE 96 103* 84  BUN 15 7* 9  CREATININE 1.03 1.01 0.79  CALCIUM 8.6* 8.8* 9.0  GFRNONAA >60 >60 >60  GFRAA >60 >60 >60  PROT 6.5 6.6 6.7  ALBUMIN 3.9 3.8 3.8  AST 34 39 31  ALT 43 48* 38  ALKPHOS 102 120 137*  BILITOT 0.8 0.7 0.8   Iron/TIBC/Ferritin/ %Sat No results found for: IRON, TIBC, FERRITIN, IRONPCTSAT  RADIOGRAPHIC STUDIES: I have  personally reviewed the radiological images as listed and agreed with the findings in the report.. 03/11/2018 CT chest lung screen 1. New 2.4 cm right lower lobe nodule with a new necrotic appearing subcarinal lymph node. Lung-RADS 4B, suspicious. Additional imaging evaluation or consultation with Pulmonology or Thoracic Surgery recommended. These results will be called to the ordering clinician or representative by the Radiologist Assistant, and communication documented in the PACS or zVision Dashboard. 2. Aortic atherosclerosis (ICD10-170.0). Coronary arterycalcification.3.  Emphysema (ICD10-J43.9). 03/13/2018 PET scan # Hypermetabolic right lower lobe pulmonary nodule, consistent with primary bronchogenic carcinoma. Fluid density subcarinal structure demonstrates no significant hypermetabolism. Although this could represent an incidental benign lesion such as a bronchogenic cyst, given interval development since 02/24/2017, necrotic adenopathy cannot be excluded and tissue sampling should be considered.  03/26/2018 DIAGNOSIS:  A. SUBCARINA, CYSTIC MASS; FNA:  - NON-SMALL CELL CARCINOMA CONSISTENT WITH POORLY DIFFERENTIATED  ADENOCARCINOMA.   MRI brain 04/01/2018  Normal MRI head with and without contrast for age.  No intracranial metastasis.  07/05/2018 CT chest w contrast #Significant partial treatment response.  Medial right lower lobe pulmonary nodule is significantly decreased.  Subcarinal adenopathy has resolved. No new or progressive metastatic disease in the chest.  Aortic sclerosis and emphysema.   ASSESSMENT & PLAN:  1. Malignant neoplasm of lower lobe of right lung ([place])   2. Encounter for antineoplastic  immunotherapy   Cancer Staging Malignant neoplasm of lower lobe of right lung [facility]) Staging form: Lung, AJCC 8th Edition - Clinical stage from 05/13/2018: Stage IIIA (cT1c, cN2, cM0) - Signed by Earlie Server, MD on 05/13/2018  #Stage IIIA lung adenocarcinoma Labs are reviewed and  discussed with patient. CT chest was independently reviewed by me and discussed with patient.  Partial response. Rationale of starting immunotherapy has been discussed with patient.  I discussed the mechanism of action and rationale of using immunotherapy.  Discussed the potential side effects of immunotherapy including but not limited to diarrhea; skin rash; respiratory failure, thyroid dysfunction, neurotoxicity, elevated LFTs/endocrine abnormalities etc. Labs okay to proceed with cycle 1 durvalumab treatment.  Plan total of 26 treatment.   The patient knows to call the clinic with any problems questions or concerns.   Return of visit: 2 weeks for lab and MD assessment prior to next immunotherapy treatment. Orders Placed This Encounter  Procedures  . TSH    Standing Status:   Standing    Number of Occurrences:   20    Standing Expiration Date:   07/10/2019   We spent sufficient time to discuss many aspect of care, questions were answered to patient's satisfaction. Total face to face encounter time for this patient visit was 25 min. >50% of the time was  spent in counseling and coordination of care.    Earlie Server, MD, PhD Hematology Oncology [facility] at [facility] Pager- [phone] 07/10/18

## 2018-07-11 ENCOUNTER — Other Ambulatory Visit: Payer: Self-pay

## 2018-07-11 ENCOUNTER — Ambulatory Visit
Admission: RE | Admit: 2018-07-11 | Discharge: 2018-07-11 | Disposition: A | Discharge status: Home / Self Care | Payer: BLUE CROSS/BLUE SHIELD | Source: Ambulatory Visit | Attending: Radiation Oncology | Admitting: Radiation Oncology

## 2018-07-11 ENCOUNTER — Encounter: Payer: Self-pay | Admitting: Radiation Oncology

## 2018-07-11 VITALS — BP 127/74 | HR 88 | Temp 96.4°F | Resp 18 | Wt 150.1 lb

## 2018-07-11 DIAGNOSIS — Z9221 Personal history of antineoplastic chemotherapy: Secondary | ICD-10-CM | POA: Insufficient documentation

## 2018-07-11 DIAGNOSIS — C3431 Malignant neoplasm of lower lobe, right bronchus or lung: Secondary | ICD-10-CM | POA: Diagnosis not present

## 2018-07-11 DIAGNOSIS — Z923 Personal history of irradiation: Secondary | ICD-10-CM | POA: Insufficient documentation

## 2018-07-11 NOTE — Progress Notes (Signed)
Radiation Oncology Follow up Note  Name: Timothy Yoder   Date:   07/11/2018 MRN:  [id number] DOB: 04/15/1956    This 63 y.o. male presents to the clinic today for one-month follow-up status post.concurrent chemoradiation for stage IIIanon-small cell lung cancer the right lower lobe  REFERRING PROVIDER: Baxter Hire, MD  HPI: patient is a 63 year old male now out 1 month having completed concurrent chemoradiation for stage IIIa (T2 N2 M0) non-small cell lung cancer right lower lobe. Seen today in routine follow-up he is doing well. He specifically denies cough hemoptysis or chest tightness. He had a recent CT scan.showing significant partial treatment response with improvement in the right lower lobe mass as well as subcarinal adenopathy which has resolved.he has been started on immunotherapy withdurvalumabunder medical oncology's direction which she is tolerating well.  COMPLICATIONS OF TREATMENT: none  FOLLOW UP COMPLIANCE: keeps appointments   PHYSICAL EXAM:  BP 127/74 (BP Location: Left Arm, Patient Position: Sitting)   Pulse 88   Temp (!) 96.4 F (35.8 C) (Tympanic)   Resp 18   Wt 150 lb 2.1 oz (68.1 kg)   BMI 22.83 kg/m  Well-developed well-nourished patient in NAD. HEENT reveals PERLA, EOMI, discs not visualized.  Oral cavity is clear. No oral mucosal lesions are identified. Neck is clear without evidence of cervical or supraclavicular adenopathy. Lungs are clear to A&P. Cardiac examination is essentially unremarkable with regular rate and rhythm without murmur rub or thrill. Abdomen is benign with no organomegaly or masses noted. Motor sensory and DTR levels are equal and symmetric in the upper and lower extremities. Cranial nerves II through XII are grossly intact. Proprioception is intact. No peripheral adenopathy or edema is identified. No motor or sensory levels are noted. Crude visual fields are within normal range.  RADIOLOGY RESULTS: CT scans reviewed and compatible with  the above-stated findings  PLAN: present time patient is doing well with excellent response to chemoradiation. He will continue on immunotherapy as described above. I'm please was overall progress. I have asked to see him back in 3-4 months for follow-up. Patient knows to call with any concerns.  I would like to take this opportunity to thank you for allowing me to participate in the care of your patient.Noreene Filbert, MD

## 2018-07-22 ENCOUNTER — Telehealth: Payer: Self-pay

## 2018-07-22 NOTE — Telephone Encounter (Signed)
Nutrition Follow-up:  Patient with non-small cell lung cancer.  Patient has completed both chemotherapy and radiation therapy on 2/17.  Patient has started durvalumab, immunotherapy.    Called patient today for nutrition follow-up.  Patient reports that appetite is better and taste are coming back.  Reports that wife is preparing 3 meals per day.  Reports breakfast is usually cereal or eggs and breakfast meat. Lunch is meat sandwich or soup with meat and vegetables. Supper is usually meat and vegetables.  Patient reports overall nutrition has improved.    Denies any nutrition impact symptoms at this time.    Medications: reviewed  Labs: reviewed  Anthropometrics:   Weight 150 lb 2.1 oz noted on 3/19 decreased from 154 lb 11.2 oz on 2/10.     NUTRITION DIAGNOSIS: Inadequate oral intake improved   INTERVENTION:  Encouraged patient to continue eating foods high in protein and calories to prevent further weight loss.  Patient feels like weight will increase as appetite has improved.  Patient has contact information and will reach out to RD with questions or nutrition concerns    MONITORING, EVALUATION, GOAL: Patient will consume adequate calories and protein to prevent further weight loss   NEXT VISIT: Patient to reach out if needed  Circe Chilton B. Zenia Resides, [place], [place] Registered Dietitian [phone] (pager)

## 2018-07-23 ENCOUNTER — Other Ambulatory Visit: Payer: Self-pay

## 2018-07-23 ENCOUNTER — Ambulatory Visit: Payer: BLUE CROSS/BLUE SHIELD | Admitting: Physician Assistant

## 2018-07-24 ENCOUNTER — Other Ambulatory Visit: Payer: Self-pay

## 2018-07-24 ENCOUNTER — Encounter: Payer: Self-pay | Admitting: Oncology

## 2018-07-24 ENCOUNTER — Inpatient Hospital Stay: Payer: BLUE CROSS/BLUE SHIELD | Attending: Oncology | Admitting: Oncology

## 2018-07-24 ENCOUNTER — Inpatient Hospital Stay: Payer: BLUE CROSS/BLUE SHIELD | Attending: Oncology

## 2018-07-24 ENCOUNTER — Inpatient Hospital Stay: Payer: BLUE CROSS/BLUE SHIELD

## 2018-07-24 VITALS — BP 116/70 | HR 71 | Temp 95.4°F | Resp 18 | Wt 153.1 lb

## 2018-07-24 DIAGNOSIS — C3431 Malignant neoplasm of lower lobe, right bronchus or lung: Secondary | ICD-10-CM

## 2018-07-24 DIAGNOSIS — Z5112 Encounter for antineoplastic immunotherapy: Secondary | ICD-10-CM | POA: Diagnosis not present

## 2018-07-24 DIAGNOSIS — R748 Abnormal levels of other serum enzymes: Secondary | ICD-10-CM | POA: Insufficient documentation

## 2018-07-24 DIAGNOSIS — K209 Esophagitis, unspecified without bleeding: Secondary | ICD-10-CM

## 2018-07-24 DIAGNOSIS — R0781 Pleurodynia: Secondary | ICD-10-CM

## 2018-07-24 DIAGNOSIS — D539 Nutritional anemia, unspecified: Secondary | ICD-10-CM

## 2018-07-24 LAB — CBC WITH DIFFERENTIAL/PLATELET
Abs Immature Granulocytes: 0.04 10*3/uL (ref 0.00–0.07)
Basophils Absolute: 0 10*3/uL (ref 0.0–0.1)
Basophils Relative: 1 %
Eosinophils Absolute: 0.1 10*3/uL (ref 0.0–0.5)
Eosinophils Relative: 2 %
HCT: 37.8 % — ABNORMAL LOW (ref 39.0–52.0)
Hemoglobin: 12.8 g/dL — ABNORMAL LOW (ref 13.0–17.0)
Immature Granulocytes: 1 %
Lymphocytes Relative: 15 %
Lymphs Abs: 0.7 10*3/uL (ref 0.7–4.0)
MCH: 34 pg (ref 26.0–34.0)
MCHC: 33.9 g/dL (ref 30.0–36.0)
MCV: 100.5 fL — ABNORMAL HIGH (ref 80.0–100.0)
Monocytes Absolute: 0.5 10*3/uL (ref 0.1–1.0)
Monocytes Relative: 10 %
Neutro Abs: 3.5 10*3/uL (ref 1.7–7.7)
Neutrophils Relative %: 71 %
Platelets: 161 10*3/uL (ref 150–400)
RBC: 3.76 MIL/uL — ABNORMAL LOW (ref 4.22–5.81)
RDW: 14.8 % (ref 11.5–15.5)
WBC: 4.9 10*3/uL (ref 4.0–10.5)
nRBC: 0 % (ref 0.0–0.2)

## 2018-07-24 LAB — COMPREHENSIVE METABOLIC PANEL
ALT: 26 U/L (ref 0–44)
AST: 25 U/L (ref 15–41)
Albumin: 3.7 g/dL (ref 3.5–5.0)
Alkaline Phosphatase: 144 U/L — ABNORMAL HIGH (ref 38–126)
Anion gap: 5 (ref 5–15)
BUN: 8 mg/dL (ref 8–23)
CO2: 25 mmol/L (ref 22–32)
Calcium: 8.7 mg/dL — ABNORMAL LOW (ref 8.9–10.3)
Chloride: 109 mmol/L (ref 98–111)
Creatinine, Ser: 1 mg/dL (ref 0.61–1.24)
GFR calc Af Amer: >60 mL/min (ref >60)
GFR calc non Af Amer: >60 mL/min (ref >60)
Glucose, Bld: 149 mg/dL — ABNORMAL HIGH (ref 70–99)
Potassium: 3.5 mmol/L (ref 3.5–5.1)
Sodium: 139 mmol/L (ref 135–145)
Total Bilirubin: 0.8 mg/dL (ref 0.3–1.2)
Total Protein: 6.4 g/dL — ABNORMAL LOW (ref 6.5–8.1)

## 2018-07-24 LAB — TSH: TSH: 1.316 u[IU]/mL (ref 0.350–4.500)

## 2018-07-24 LAB — GAMMA GT: GGT: 45 U/L (ref 7–50)

## 2018-07-24 MED ORDER — SODIUM CHLORIDE 0.9 % IV SOLN
Freq: Once | INTRAVENOUS | Status: AC
  Administered 2018-07-24: 10:00:00 via INTRAVENOUS
  Filled 2018-07-24: qty 250

## 2018-07-24 MED ORDER — HEPARIN SOD (PORK) LOCK FLUSH 100 UNIT/ML IV SOLN
500.0000 [IU] | Freq: Once | INTRAVENOUS | Status: AC | PRN
  Administered 2018-07-24: 500 [IU]
  Filled 2018-07-24: qty 5

## 2018-07-24 MED ORDER — SODIUM CHLORIDE 0.9 % IV SOLN
10.0000 mg/kg | Freq: Once | INTRAVENOUS | Status: AC
  Administered 2018-07-24: 700 mg via INTRAVENOUS
  Filled 2018-07-24: qty 14

## 2018-07-24 MED ORDER — SODIUM CHLORIDE 0.9% FLUSH
10.0000 mL | Freq: Once | INTRAVENOUS | Status: AC
  Administered 2018-07-24: 09:00:00 10 mL via INTRAVENOUS
  Filled 2018-07-24: qty 10

## 2018-07-24 MED ORDER — OMEPRAZOLE 40 MG PO CPDR: 40.0000 mg | DELAYED_RELEASE_CAPSULE | Freq: Two times a day (BID) | ORAL | 1 refills | Status: DC

## 2018-07-24 NOTE — Progress Notes (Signed)
Patient here for follow up. Pt wanting to know if Dr. Tasia Catchings will refill omeprazole so he does not have to go to PCP.

## 2018-07-24 NOTE — Progress Notes (Signed)
Hematology/Oncology follow up note [facility] [phone] [phone]   Patient Care Team: Baxter Hire, MD as PCP - General (Internal Medicine) Sherren Mocha, MD as PCP - Cardiology (Cardiology) Telford Nab, RN as Registered Nurse  REFERRING PROVIDER: Dr.Fleming REASON FOR VISIT:  Follow-up for management of stage III non-small cell lung cancer   HISTORY OF PRESENTING ILLNESS:  Timothy Yoder is a  63 y.o.  male with PMH listed below who was referred to me for evaluation of lung mass.  Patient is a former smoker quit smoking 7 years ago.  40-pack-year. Patient chest lung cancer screening on 03/11/2018. CT scan showed 2.4 cm right lower lobe nodule with a new necrotic.  Subcarinal lymph node. PET scan showed hypermetabolic right lower pulmonary nodule, consistent with primary bronchogenic carcinoma.  Fluid density subcarinal structure demonstrate no significant hypermetabolism.  Although this could represent an incidental benign lesion such as a cyst given the interval development since 02/24/2017, necrotic adenopathy cannot be excluded and tissue sampling should be considered.  Patient was seen by Dr. Faith Rogue for initial evaluation on 03/13/2018.  Patient has not got PET scan at that time. Today patient was accompanied by his wife to the oncology clinic to discuss management plan.  And PET scan results. Patient reports chronic shortness of breath with moderate exertion.  Appetite is good denies any fever, chills, hemoptysis, weight loss.  # underwent biopsy via bronchoscopy.  Subcarinal lymph node biopsy positive for non-small cell lung cancer, favoring poorly differentiated adenocarcinoma. #Mediport was placed by Dr. Genevive Bi   # Cancer Treatment 04/26/2018 -06/11/2018 Concurrent Chemotherapy Daneil Dolin and Taxol] with RT.  07/10/2018 Started on maintenance Durvalumab.  INTERVAL HISTORY Timothy Yoder is a 63 y.o. male who has above  history reviewed by me today presents for follow up visit for management of stage III non-small cell lung cancer.  Patient has been on immunotherapy maintenance.  Reports doing well. Denies any shortness of breath, cough, diarrhea, abdominal pain or shortness of breath. He reports left posterior rib cage pain which happened this past weekend.  He has been working in the garage and may have pulled a muscle.  Acid reflux/indigestion symptoms are stable.  Patient is taking omeprazole.  Request refill.  Review of Systems  Constitutional: Negative for appetite change, chills, fatigue, fever and unexpected weight change.  HENT:   Negative for hearing loss and voice change.   Eyes: Negative for eye problems and icterus.  Respiratory: Negative for chest tightness, cough and shortness of breath.   Cardiovascular: Negative for chest pain and leg swelling.  Gastrointestinal: Negative for abdominal distention and abdominal pain.  Endocrine: Negative for hot flashes.  Genitourinary: Negative for difficulty urinating, dysuria and frequency.   Musculoskeletal: Negative for arthralgias.       Left posterior rib cage pain  Skin: Negative for itching and rash.  Neurological: Negative for light-headedness and numbness.  Hematological: Negative for adenopathy. Does not bruise/bleed easily.  Psychiatric/Behavioral: Negative for confusion.   MEDICAL HISTORY:  Past Medical History:  Diagnosis Date  . BPH (benign prostatic hyperplasia)   . CAD (coronary artery disease)    a. 04/2014 anterolateral STEMI/PCI: LM nl, LAD 87m(2.75x16 Promus DES, D1 50p, LCX nl, RI nl, OM1 100 small/chronic, RCA dominant, [id number](FFR 0.90->Med Rx), RPDA 50, RPLA 70, EF 60%.  . CMarland KitchenPD (chronic obstructive pulmonary disease) (HCC)    Dr FleRaul Del Former tobacco use   . Gastroesophageal reflux disease   .  HTN (hypertension)   . Hyperlipidemia   . Hypokalemia   . Malignant neoplasm of lung ([place]) 04/01/2018  . Myocardial  infarction ([place]) 04/2014   Dr Sherren Mocha  . Stented coronary artery     SURGICAL HISTORY: Past Surgical History:  Procedure Laterality Date  . CARDIAC CATHETERIZATION    . CHOLECYSTECTOMY N/A 02/15/2016   Procedure: LAPAROSCOPIC CHOLECYSTECTOMY WITH INTRAOPERATIVE CHOLANGIOGRAM;  Surgeon: Leonie Green, MD;  Location: [facility] ORS;  Service: General;  Laterality: N/A;  . COLONOSCOPY    . COLONOSCOPY WITH PROPOFOL N/A 08/02/2015   Procedure: COLONOSCOPY WITH PROPOFOL;  Surgeon: Manya Silvas, MD;  Location: [facility] ENDOSCOPY;  Service: Endoscopy;  Laterality: N/A;  . CORONARY ANGIOPLASTY  04/2014   STENT  . ENDOBRONCHIAL ULTRASOUND N/A 03/26/2018   Procedure: ENDOBRONCHIAL ULTRASOUND;  Surgeon: Flora Lipps, MD;  Location: [facility] ORS;  Service: Cardiopulmonary;  Laterality: N/A;  . ESOPHAGOGASTRODUODENOSCOPY (EGD) WITH PROPOFOL N/A 08/02/2015   Procedure: ESOPHAGOGASTRODUODENOSCOPY (EGD) WITH PROPOFOL;  Surgeon: Manya Silvas, MD;  Location: [facility] ENDOSCOPY;  Service: Endoscopy;  Laterality: N/A;  . FLEXIBLE BRONCHOSCOPY N/A 03/26/2018   Procedure: FLEXIBLE BRONCHOSCOPY;  Surgeon: Flora Lipps, MD;  Location: [facility] ORS;  Service: Cardiopulmonary;  Laterality: N/A;  . KNEE ARTHROSCOPY Left   . LEFT HEART CATHETERIZATION WITH CORONARY ANGIOGRAM N/A 05/03/2014   Procedure: LEFT HEART CATHETERIZATION WITH CORONARY ANGIOGRAM;  Surgeon: Blane Ohara, MD;  Location: [facility] CATH LAB;  Service: Cardiovascular;  Laterality: N/A;  . PERCUTANEOUS CORONARY STENT INTERVENTION (PCI-S) N/A 05/05/2014   Procedure: PERCUTANEOUS CORONARY STENT INTERVENTION (PCI-S);  Surgeon: Peter M Martinique, MD;  Location: [facility] CATH LAB;  Service: Cardiovascular;  Laterality: N/A;  . PORTACATH PLACEMENT Left 04/04/2018   Procedure: INSERTION PORT-A-CATH;  Surgeon: [name], MD;  Location: [facility] ORS;  Service: General;  Laterality: Left;    SOCIAL HISTORY: Social History   Socioeconomic History  . Marital status:  Married    Spouse name: Not on file  . Number of children: Not on file  . Years of education: Not on file  . Highest education level: Not on file  Occupational History  . Not on file  Social Needs  . Financial resource strain: Not on file  . Food insecurity:    Worry: Not on file    Inability: Not on file  . Transportation needs:    Medical: Not on file    Non-medical: Not on file  Tobacco Use  . Smoking status: Former Smoker    Packs/day: 1.00    Years: 40.00    Pack years: 40.00    Types: Cigarettes    Last attempt to quit: 02/14/2011    Years since quitting: 7.4  . Smokeless tobacco: Never Used  Substance and Sexual Activity  . Alcohol use: No  . Drug use: No  . Sexual activity: Not on file  Lifestyle  . Physical activity:    Days per week: Not on file    Minutes per session: Not on file  . Stress: Not on file  Relationships  . Social connections:    Talks on phone: Not on file    Gets together: Not on file    Attends religious service: Not on file    Active member of club or organization: Not on file    Attends meetings of clubs or organizations: Not on file    Relationship status: Not on file  . Intimate partner violence:    Fear of current or ex partner: Not on file  Emotionally abused: Not on file    Physically abused: Not on file    Forced sexual activity: Not on file  Other Topics Concern  . Not on file  Social History Narrative   The patient is married. He lives in [place]. He works full-time. He quit smoking in 2013.    FAMILY HISTORY: Family History  Problem Relation Age of Onset  . CAD Mother 27       Died of an MI  . CAD Father 75       Died of a massive MI  . COPD Sister   . Lung cancer Brother   . Bone cancer Paternal Uncle   . Colon cancer Neg Hx   . Breast cancer Neg Hx     ALLERGIES:  has No Known Allergies.  MEDICATIONS:  Current Outpatient Medications  Medication Sig Dispense Refill  . acetaminophen (TYLENOL) 500 MG  tablet Take 1,000 mg by mouth every 6 (six) hours as needed (for pain.).    Marland Kitchen albuterol (PROAIR HFA) 108 (90 Base) MCG/ACT inhaler Inhale 2 puffs into the lungs every 6 (six) hours as needed for wheezing or shortness of breath.     Marland Kitchen aspirin EC 81 MG tablet Take 81 mg by mouth daily.    Marland Kitchen atorvastatin (LIPITOR) 80 MG tablet Take 1 tablet (80 mg total) by mouth daily at 6 PM. 90 tablet 3  . lisinopril (PRINIVIL,ZESTRIL) 10 MG tablet Take 1 tablet (10 mg total) by mouth daily. 90 tablet 3  . nitroGLYCERIN (NITROSTAT) 0.4 MG SL tablet Place 1 tablet (0.4 mg total) under the tongue every 5 (five) minutes as needed for chest pain. X 3 doses 25 tablet 3  . potassium chloride SA (KLOR-CON M20) 20 MEQ tablet Take 1 tablet (20 mEq total) by mouth daily. 90 tablet 1  . sucralfate (CARAFATE) 1 g tablet Take 1 tablet (1 g total) by mouth 3 (three) times daily before meals. Dissolve in warm water, swish and swallow 90 tablet 6  . tamsulosin (FLOMAX) 0.4 MG CAPS capsule Take 0.4 mg by mouth daily after supper.    . traZODone (DESYREL) 50 MG tablet Take 1 tablet (50 mg total) by mouth at bedtime. 90 tablet 0  . umeclidinium-vilanterol (ANORO ELLIPTA) 62.5-25 MCG/INH AEPB Inhale 1 puff into the lungs every morning.    . carvedilol (COREG) 6.25 MG tablet Take 1 tablet (6.25 mg total) by mouth 2 (two) times daily. 180 tablet 3  . colestipol (COLESTID) 1 g tablet Take 2 g by mouth daily as needed (stomach issues).     Marland Kitchen omeprazole (PRILOSEC) 40 MG capsule Take 1 capsule (40 mg total) by mouth 2 (two) times daily. 60 capsule 1   No current facility-administered medications for this visit.      PHYSICAL EXAMINATION: ECOG PERFORMANCE STATUS: 0 - Asymptomatic Vitals:   07/24/18 0854  BP: 116/70  Pulse: 71  Resp: 18  Temp: (!) 95.4 F (35.2 C)  SpO2: 97%   Filed Weights   07/24/18 0854  Weight: 153 lb 1.6 oz (69.4 kg)    Physical Exam Constitutional:      General: He is not in acute distress. HENT:      Head: Normocephalic and atraumatic.  Eyes:     General: No scleral icterus.    Pupils: Pupils are equal, round, and reactive to light.  Neck:     Musculoskeletal: Normal range of motion and neck supple.  Cardiovascular:     Rate and Rhythm: Normal  rate and regular rhythm.     Heart sounds: Normal heart sounds.  Pulmonary:     Effort: Pulmonary effort is normal. No respiratory distress.     Breath sounds: No wheezing.  Abdominal:     General: Bowel sounds are normal. There is no distension.     Palpations: Abdomen is soft. There is no mass.     Tenderness: There is no abdominal tenderness.  Musculoskeletal: Normal range of motion.        General: No deformity.  Skin:    General: Skin is warm and dry.     Findings: No erythema or rash.  Neurological:     Mental Status: He is alert and oriented to person, place, and time.     Cranial Nerves: No cranial nerve deficit.     Coordination: Coordination normal.  Psychiatric:        Behavior: Behavior normal.        Thought Content: Thought content normal.      LABORATORY DATA:  I have reviewed the data as listed Lab Results  Component Value Date   WBC 4.9 07/24/2018   HGB 12.8 (L) 07/24/2018   HCT 37.8 (L) 07/24/2018   MCV 100.5 (H) 07/24/2018   PLT 161 07/24/2018   Recent Labs    07/01/18 1316 07/10/18 0844 07/24/18 0825  NA 140 139 139  K 4.0 3.9 3.5  CL 107 109 109  CO2 _0 GLUCOSE 103* 84 149*  BUN 7* 9 8  CREATININE 1.01 0.79 1.00  CALCIUM 8.8* 9.0 8.7*  GFRNONAA >60 >60 >60  GFRAA >60 >60 >60  PROT 6.6 6.7 6.4*  ALBUMIN 3.8 3.8 3.7  AST 39 31 25  ALT 48* 38 26  ALKPHOS 120 137* 144*  BILITOT 0.7 0.8 0.8   Iron/TIBC/Ferritin/ %Sat No results found for: IRON, TIBC, FERRITIN, IRONPCTSAT  RADIOGRAPHIC STUDIES: I have personally reviewed the radiological images as listed and agreed with the findings in the report.. 03/11/2018 CT chest lung screen 1. New 2.4 cm right lower lobe nodule with a new  necrotic appearing subcarinal lymph node. Lung-RADS 4B, suspicious. Additional imaging evaluation or consultation with Pulmonology or Thoracic Surgery recommended. These results will be called to the ordering clinician or representative by the Radiologist Assistant, and communication documented in the PACS or zVision Dashboard. 2. Aortic atherosclerosis (ICD10-170.0). Coronary arterycalcification.3.  Emphysema (ICD10-J43.9). 03/13/2018 PET scan # Hypermetabolic right lower lobe pulmonary nodule, consistent with primary bronchogenic carcinoma. Fluid density subcarinal structure demonstrates no significant hypermetabolism. Although this could represent an incidental benign lesion such as a bronchogenic cyst, given interval development since 02/24/2017, necrotic adenopathy cannot be excluded and tissue sampling should be considered.  03/26/2018 DIAGNOSIS:  A. SUBCARINA, CYSTIC MASS; FNA:  - NON-SMALL CELL CARCINOMA CONSISTENT WITH POORLY DIFFERENTIATED  ADENOCARCINOMA.   MRI brain 04/01/2018  Normal MRI head with and without contrast for age.  No intracranial metastasis.  07/05/2018 CT chest w contrast #Significant partial treatment response.  Medial right lower lobe pulmonary nodule is significantly decreased.  Subcarinal adenopathy has resolved. No new or progressive metastatic disease in the chest.  Aortic sclerosis and emphysema.   ASSESSMENT & PLAN:  1. Malignant neoplasm of lower lobe of right lung ([place])   2. Encounter for antineoplastic immunotherapy   3. Esophagitis   4. Elevated alkaline phosphatase level   5. Rib pain on left side   Cancer Staging Malignant neoplasm of lower lobe of right lung ([place]) Staging form: Lung, AJCC 8th  Edition - Clinical stage from 05/13/2018: Stage IIIA (cT1c, cN2, cM0) - Signed by Earlie Server, MD on 05/13/2018  #Stage IIIA lung adenocarcinoma Labs are reviewed and discussed with patient. Counts acceptable to proceed with durvalumab treatment.  #Left  posterior rib cage possible musculoskeletal, persistent will obtain bone scan #Elevated alkaline phosphatase Check GGT # GERD/esophagitis, continue omeprazole. Refills sent.  #Macrocytic anemia, check vitamin B12 and folate level.  At next visit.  The patient knows to call the clinic with any problems questions or concerns.   Return of visit: 2 weeks for lab and MD assessment prior to next immunotherapy treatment. Orders Placed This Encounter  Procedures  . Gamma GT    Standing Status:   Future    Number of Occurrences:   1    Standing Expiration Date:   07/24/2019   Earlie Server, MD, PhD Hematology Oncology [facility] Pager- [phone] 07/24/18

## 2018-07-29 ENCOUNTER — Other Ambulatory Visit: Payer: BLUE CROSS/BLUE SHIELD

## 2018-07-29 ENCOUNTER — Ambulatory Visit: Payer: BLUE CROSS/BLUE SHIELD

## 2018-07-29 ENCOUNTER — Ambulatory Visit: Payer: BLUE CROSS/BLUE SHIELD | Admitting: Oncology

## 2018-07-29 ENCOUNTER — Encounter: Payer: Self-pay | Admitting: Oncology

## 2018-07-30 ENCOUNTER — Other Ambulatory Visit: Payer: Self-pay | Admitting: Oncology

## 2018-07-30 DIAGNOSIS — C3431 Malignant neoplasm of lower lobe, right bronchus or lung: Secondary | ICD-10-CM

## 2018-07-30 DIAGNOSIS — R0781 Pleurodynia: Secondary | ICD-10-CM

## 2018-08-06 ENCOUNTER — Encounter
Admission: RE | Admit: 2018-08-06 | Discharge: 2018-08-06 | Disposition: A | Discharge status: Home / Self Care | Payer: BLUE CROSS/BLUE SHIELD | Source: Ambulatory Visit | Attending: Oncology | Admitting: Oncology

## 2018-08-06 ENCOUNTER — Other Ambulatory Visit: Payer: Self-pay

## 2018-08-06 ENCOUNTER — Ambulatory Visit
Admission: RE | Admit: 2018-08-06 | Discharge: 2018-08-06 | Disposition: A | Discharge status: Home / Self Care | Payer: BLUE CROSS/BLUE SHIELD | Source: Ambulatory Visit | Attending: Oncology | Admitting: Oncology

## 2018-08-06 DIAGNOSIS — R0781 Pleurodynia: Secondary | ICD-10-CM | POA: Insufficient documentation

## 2018-08-06 DIAGNOSIS — C3431 Malignant neoplasm of lower lobe, right bronchus or lung: Secondary | ICD-10-CM | POA: Diagnosis not present

## 2018-08-06 MED ORDER — TECHNETIUM TC 99M MEDRONATE IV KIT
20.0000 | PACK | Freq: Once | INTRAVENOUS | Status: AC | PRN
  Administered 2018-08-06: 23.4 via INTRAVENOUS

## 2018-08-07 ENCOUNTER — Encounter: Payer: Self-pay | Admitting: Oncology

## 2018-08-07 ENCOUNTER — Inpatient Hospital Stay: Payer: BLUE CROSS/BLUE SHIELD

## 2018-08-07 ENCOUNTER — Inpatient Hospital Stay (HOSPITAL_BASED_OUTPATIENT_CLINIC_OR_DEPARTMENT_OTHER): Payer: BLUE CROSS/BLUE SHIELD | Admitting: Oncology

## 2018-08-07 ENCOUNTER — Other Ambulatory Visit: Payer: Self-pay

## 2018-08-07 DIAGNOSIS — C3431 Malignant neoplasm of lower lobe, right bronchus or lung: Secondary | ICD-10-CM

## 2018-08-07 DIAGNOSIS — E538 Deficiency of other specified B group vitamins: Secondary | ICD-10-CM

## 2018-08-07 DIAGNOSIS — R0781 Pleurodynia: Secondary | ICD-10-CM

## 2018-08-07 DIAGNOSIS — R748 Abnormal levels of other serum enzymes: Secondary | ICD-10-CM | POA: Diagnosis not present

## 2018-08-07 DIAGNOSIS — Z87891 Personal history of nicotine dependence: Secondary | ICD-10-CM

## 2018-08-07 DIAGNOSIS — Z7982 Long term (current) use of aspirin: Secondary | ICD-10-CM

## 2018-08-07 DIAGNOSIS — Z79899 Other long term (current) drug therapy: Secondary | ICD-10-CM

## 2018-08-07 DIAGNOSIS — Z5112 Encounter for antineoplastic immunotherapy: Secondary | ICD-10-CM

## 2018-08-07 NOTE — Progress Notes (Signed)
Called patient for WebEx visit.  Patient c/o soreness to the touch around abdomin, wearing a shirt that touches the skin hurts, started about 2-3 weeks ago.

## 2018-08-07 NOTE — Progress Notes (Addendum)
HEMATOLOGY-ONCOLOGY TeleHEALTH VISIT PROGRESS NOTE  I connected with [name] on 08/07/18 at  9:00 AM EDT by video enabled telemedicine visit and verified that I am speaking with the correct person using two identifiers. I discussed the limitations, risks, security and privacy concerns of performing an evaluation and management service by telemedicine and the availability of in-person appointments. I also discussed with the patient that there may be a patient responsible charge related to this service. The patient expressed understanding and agreed to proceed.   Other persons participating in the visit and their role in the encounter:  Geraldine Solar, CMA, check in patient     Patient's location: Home  Provider's location:home  Chief Complaint: Follow-up for evaluation prior to maintenance durvalumab lung cancer.  INTERVAL HISTORY Timothy Yoder is a 63 y.o. male who has above history reviewed by me today presents for follow up visit for management of immunotherapy treatment for lung cancer Problems and complaints are listed below:  Patient reports feeling soreness to touch around abdomen, discomfort is exacerbated when he is cloth touches the skin.  Started 2 to 3 weeks ago.  Feels like" sunburn". Denies any skin rash.  Otherwise he has no new complaints.  Doing well. Denies any abdominal pain, chest pain, cough, diarrhea He still has left rib cage pain.  Takes Tylenol as needed.  During the interval he has had bone scan done.  Review of Systems  Constitutional: Negative for appetite change, chills, fatigue, fever and unexpected weight change.  HENT:   Negative for hearing loss and voice change.   Eyes: Negative for eye problems and icterus.  Respiratory: Negative for chest tightness, cough and shortness of breath.   Cardiovascular: Negative for chest pain and leg swelling.  Gastrointestinal: Negative for abdominal distention and abdominal pain.  Endocrine: Negative for hot  flashes.  Genitourinary: Negative for difficulty urinating, dysuria and frequency.   Musculoskeletal: Negative for arthralgias.  Skin: Negative for itching and rash.       As described in the HPI  Neurological: Negative for light-headedness and numbness.  Hematological: Negative for adenopathy. Does not bruise/bleed easily.  Psychiatric/Behavioral: Negative for confusion.    Past Medical History:  Diagnosis Date  . BPH (benign prostatic hyperplasia)   . CAD (coronary artery disease)    a. 04/2014 anterolateral STEMI/PCI: LM nl, LAD 15m(2.75x16 Promus DES, D1 50p, LCX nl, RI nl, OM1 100 small/chronic, RCA dominant, [id number](FFR 0.90->Med Rx), RPDA 50, RPLA 70, EF 60%.  . CMarland KitchenPD (chronic obstructive pulmonary disease) (HCC)    Dr FleRaul Del Former tobacco use   . Gastroesophageal reflux disease   . HTN (hypertension)   . Hyperlipidemia   . Hypokalemia   . [place]  . [place]   Dr MicSherren Mocha Stented coronary artery    Past Surgical History:  Procedure Laterality Date  . CARDIAC CATHETERIZATION    . CHOLECYSTECTOMY N/A 02/15/2016   Procedure: LAPAROSCOPIC CHOLECYSTECTOMY WITH INTRAOPERATIVE CHOLANGIOGRAM;  Surgeon: JarLeonie GreenD;  Location: [facility] ORS;  Service: General;  Laterality: N/A;  . COLONOSCOPY    . COLONOSCOPY WITH PROPOFOL N/A 08/02/2015   Procedure: COLONOSCOPY WITH PROPOFOL;  Surgeon: RobManya SilvasD;  [facility];  Service: Endoscopy;  Laterality: N/A;  . CORONARY ANGIOPLASTY  04/2014   STENT  . ENDOBRONCHIAL ULTRASOUND N/A 03/26/2018   Procedure: ENDOBRONCHIAL ULTRASOUND;  Surgeon: KasFlora LippsD;  Location: [facility] ORS;  Service:  Cardiopulmonary;  Laterality: N/A;  . ESOPHAGOGASTRODUODENOSCOPY (EGD) WITH PROPOFOL N/A 08/02/2015   Procedure: ESOPHAGOGASTRODUODENOSCOPY (EGD) WITH PROPOFOL;  Surgeon: Manya Silvas, MD;  [facility];  Service: Endoscopy;   Laterality: N/A;  . FLEXIBLE BRONCHOSCOPY N/A 03/26/2018   Procedure: FLEXIBLE BRONCHOSCOPY;  Surgeon: Flora Lipps, MD;  Location: [facility] ORS;  Service: Cardiopulmonary;  Laterality: N/A;  . KNEE ARTHROSCOPY Left   . LEFT HEART CATHETERIZATION WITH CORONARY ANGIOGRAM N/A 05/03/2014   Procedure: LEFT HEART CATHETERIZATION WITH CORONARY ANGIOGRAM;  Surgeon: Blane Ohara, MD;  [facility] CATH LAB;  Service: Cardiovascular;  Laterality: N/A;  . PERCUTANEOUS CORONARY STENT INTERVENTION (PCI-S) N/A 05/05/2014   Procedure: PERCUTANEOUS CORONARY STENT INTERVENTION (PCI-S);  Surgeon: Peter M Martinique, MD;  Location[facility] C[facility] LAB;  Service: Cardiovascular;  Laterality: N/A;  . PORTACATH PLACEMENT Left 04/04/2018   Procedure: INSERTION PORT-A-CATH;  Surgeon: [name], MD;  Location: [facility] ORS;  Service: General;  Laterality: Left;    Family History  Problem Relation Age of Onset  . CAD Mother 42       Died of an MI  . CAD Father 18       Died of a massive MI  . COPD Sister   . Lung cancer Brother   . Bone cancer Paternal Uncle   . Colon cancer Neg Hx   . Breast cancer Neg Hx     Social History   Socioeconomic History  . Marital status: Married    Spouse name: Not on file  . Number of children: Not on file  . Years of education: Not on file  . Highest education level: Not on file  Occupational History  . Not on file  Social Needs  . Financial resource strain: Not on file  . Food insecurity:    Worry: Not on file    Inability: Not on file  . Transportation needs:    Medical: Not on file    Non-medical: Not on file  Tobacco Use  . Smoking status: Former Smoker    Packs/day: 1.00    Years: 40.00    Pack years: 40.00    Types: Cigarettes    Last attempt to quit: 02/14/2011    Years since quitting: 7.4  . Smokeless tobacco: Never Used  Substance and Sexual Activity  . Alcohol use: No  . Drug use: No  . Sexual activity: Not on file  Lifestyle  . Physical activity:    Days per  week: Not on file    Minutes per session: Not on file  . Stress: Not on file  Relationships  . Social connections:    Talks on phone: Not on file    Gets together: Not on file    Attends religious service: Not on file    Active member of club or organization: Not on file    Attends meetings of clubs or organizations: Not on file    Relationship status: Not on file  . Intimate partner violence:    Fear of current or ex partner: Not on file    Emotionally abused: Not on file    Physically abused: Not on file    Forced sexual activity: Not on file  Other Topics Concern  . Not on file  Social History Narrative   The patient is married. He lives in [place]. He works full-time. He quit smoking in 2013.    Current Outpatient Medications on File Prior to Visit  Medication Sig Dispense Refill  . acetaminophen (TYLENOL)  500 MG tablet Take 1,000 mg by mouth every 6 (six) hours as needed (for pain.).    Marland Kitchen albuterol (PROAIR HFA) 108 (90 Base) MCG/ACT inhaler Inhale 2 puffs into the lungs every 6 (six) hours as needed for wheezing or shortness of breath.     Marland Kitchen aspirin EC 81 MG tablet Take 81 mg by mouth daily.    Marland Kitchen atorvastatin (LIPITOR) 80 MG tablet Take 1 tablet (80 mg total) by mouth daily at 6 PM. 90 tablet 3  . lisinopril (PRINIVIL,ZESTRIL) 10 MG tablet Take 1 tablet (10 mg total) by mouth daily. 90 tablet 3  . nitroGLYCERIN (NITROSTAT) 0.4 MG SL tablet Place 1 tablet (0.4 mg total) under the tongue every 5 (five) minutes as needed for chest pain. X 3 doses 25 tablet 3  . omeprazole (PRILOSEC) 40 MG capsule Take 1 capsule (40 mg total) by mouth 2 (two) times daily. 60 capsule 1  . potassium chloride SA (KLOR-CON M20) 20 MEQ tablet Take 1 tablet (20 mEq total) by mouth daily. 90 tablet 1  . sucralfate (CARAFATE) 1 g tablet Take 1 tablet (1 g total) by mouth 3 (three) times daily before meals. Dissolve in warm water, swish and swallow 90 tablet 6  . tamsulosin (FLOMAX) 0.4 MG CAPS capsule  Take 0.4 mg by mouth daily after supper.    . traZODone (DESYREL) 50 MG tablet Take 1 tablet (50 mg total) by mouth at bedtime. 90 tablet 0  . umeclidinium-vilanterol (ANORO ELLIPTA) 62.5-25 MCG/INH AEPB Inhale 1 puff into the lungs every morning.    . carvedilol (COREG) 6.25 MG tablet Take 1 tablet (6.25 mg total) by mouth 2 (two) times daily. 180 tablet 3  . colestipol (COLESTID) 1 g tablet Take 2 g by mouth daily as needed (stomach issues).     . [DISCONTINUED] prochlorperazine (COMPAZINE) 10 MG tablet Take 1 tablet (10 mg total) by mouth every 6 (six) hours as needed (Nausea or vomiting). 30 tablet 1   No current facility-administered medications on file prior to visit.     No Known Allergies     Observations/Objective: Today's Vitals   08/07/18 0848  PainSc: 0-No pain   There is no height or weight on file to calculate BMI.  Physical Exam  Constitutional: He is oriented to person, place, and time and well-developed, well-nourished, and in no distress. No distress.  HENT:  Head: Normocephalic and atraumatic.  Eyes: EOM are normal.  Neck: Normal range of motion. No thyromegaly present.  Neurological: He is alert and oriented to person, place, and time.  Skin: No erythema.  No skin rash on areas he reports soreness to touch.  Psychiatric: Affect normal.   CBC    Component Value Date/Time   WBC 4.9 07/24/2018 0825   RBC 3.76 (L) 07/24/2018 0825   HGB 12.8 (L) 07/24/2018 0825   HGB 15.1 05/03/2014 1457   HCT 37.8 (L) 07/24/2018 0825   HCT 44.8 05/03/2014 1457   PLT 161 07/24/2018 0825   PLT 234 05/03/2014 1457   MCV 100.5 (H) 07/24/2018 0825   MCV 96 05/03/2014 1457   MCH 34.0 07/24/2018 0825   MCHC 33.9 07/24/2018 0825   RDW 14.8 07/24/2018 0825   RDW 13.3 05/03/2014 1457   LYMPHSABS 0.7 07/24/2018 0825   MONOABS 0.5 07/24/2018 0825   EOSABS 0.1 07/24/2018 0825   BASOSABS 0.0 07/24/2018 0825    CMP     Component Value Date/Time   NA 139 07/24/2018 0825   NA  140  08/12/2015 0827   NA 142 05/03/2014 1457   K 3.5 07/24/2018 0825   K 3.3 (L) 05/03/2014 1457   CL 109 07/24/2018 0825   CL 107 05/03/2014 1457   CO2 25 07/24/2018 0825   CO2 26 05/03/2014 1457   GLUCOSE 149 (H) 07/24/2018 0825   GLUCOSE 89 05/03/2014 1457   BUN 8 07/24/2018 0825   BUN 10 08/12/2015 0827   BUN 10 05/03/2014 1457   CREATININE 1.00 07/24/2018 0825   CREATININE 1.11 05/03/2014 1457   CALCIUM 8.7 (L) 07/24/2018 0825   CALCIUM 8.3 (L) 05/03/2014 1457   PROT 6.4 (L) 07/24/2018 0825   PROT 6.5 08/12/2015 0827   ALBUMIN 3.7 07/24/2018 0825   ALBUMIN 4.0 08/12/2015 0827   AST 25 07/24/2018 0825   ALT 26 07/24/2018 0825   ALKPHOS 144 (H) 07/24/2018 0825   BILITOT 0.8 07/24/2018 0825   BILITOT 0.4 08/12/2015 0827   GFRNONAA >60 07/24/2018 0825   GFRNONAA >60 05/03/2014 1457   GFRAA >60 07/24/2018 0825   GFRAA >60 05/03/2014 1457     Assessment and Plan: 1. Malignant neoplasm of lower lobe of right lung ([place])   2. Encounter for antineoplastic immunotherapy   3. Rib pain on left side   4. Elevated alkaline phosphatase level   5. Folate deficiency     Patient tolerates immunotherapy well. He is going to proceed with CBC and CMP testing tomorrow, if lab results and vital signs meet criteria for treatment, he is going to proceed with maintenance durvalumab on 08/08/2018  Elevated alkaline phosphatase level, etiology unknown.  GGT normal. Left rib cage pain, patient attributes to pulling a muscle while he was working in the garage.  Advised patient to take Tylenol as needed for pain. Bone scan was independently reviewed and discussed with patient.  Negative for bone metastasis.  # Skin soreness, etiology unknown. Monitor.  Plan repeat CT chest in June.  08/08/2018 labs reviewed. Low folate level and low normal B12.  These are new problems. Advise patient to take oral folic acid 50m daily and vitamin b12 10066mdaily.   Follow Up Instructions: 2 weeks.  I discussed  the assessment and treatment plan with the patient. The patient was provided an opportunity to ask questions and all were answered. The patient agreed with the plan and demonstrated an understanding of the instructions.  The patient was advised to call back or seek an in-person evaluation if the symptoms worsen or if the condition fails to improve as anticipated.  ZhEarlie ServerMD 08/07/2018 2:45 PM

## 2018-08-08 ENCOUNTER — Other Ambulatory Visit: Payer: Self-pay

## 2018-08-08 ENCOUNTER — Other Ambulatory Visit: Payer: Self-pay | Admitting: Oncology

## 2018-08-08 ENCOUNTER — Inpatient Hospital Stay: Payer: BLUE CROSS/BLUE SHIELD

## 2018-08-08 ENCOUNTER — Encounter: Payer: Self-pay | Admitting: *Deleted

## 2018-08-08 VITALS — BP 158/88 | HR 84 | Temp 97.9°F | Resp 18 | Wt 152.8 lb

## 2018-08-08 DIAGNOSIS — R748 Abnormal levels of other serum enzymes: Secondary | ICD-10-CM

## 2018-08-08 DIAGNOSIS — Z95828 Presence of other vascular implants and grafts: Secondary | ICD-10-CM

## 2018-08-08 DIAGNOSIS — K209 Esophagitis, unspecified without bleeding: Secondary | ICD-10-CM

## 2018-08-08 DIAGNOSIS — C3431 Malignant neoplasm of lower lobe, right bronchus or lung: Secondary | ICD-10-CM

## 2018-08-08 DIAGNOSIS — R0781 Pleurodynia: Secondary | ICD-10-CM

## 2018-08-08 DIAGNOSIS — Z5112 Encounter for antineoplastic immunotherapy: Secondary | ICD-10-CM

## 2018-08-08 LAB — COMPREHENSIVE METABOLIC PANEL
ALT: 24 U/L (ref 0–44)
AST: 27 U/L (ref 15–41)
Albumin: 3.8 g/dL (ref 3.5–5.0)
Alkaline Phosphatase: 187 U/L — ABNORMAL HIGH (ref 38–126)
Anion gap: 5 (ref 5–15)
BUN: 8 mg/dL (ref 8–23)
CO2: 25 mmol/L (ref 22–32)
Calcium: 9 mg/dL (ref 8.9–10.3)
Chloride: 107 mmol/L (ref 98–111)
Creatinine, Ser: 1 mg/dL (ref 0.61–1.24)
GFR calc Af Amer: >60 mL/min (ref >60)
GFR calc non Af Amer: >60 mL/min (ref >60)
Glucose, Bld: 106 mg/dL — ABNORMAL HIGH (ref 70–99)
Potassium: 3.5 mmol/L (ref 3.5–5.1)
Sodium: 137 mmol/L (ref 135–145)
Total Bilirubin: 0.9 mg/dL (ref 0.3–1.2)
Total Protein: 7.2 g/dL (ref 6.5–8.1)

## 2018-08-08 LAB — CBC WITH DIFFERENTIAL/PLATELET
Abs Immature Granulocytes: 0.04 10*3/uL (ref 0.00–0.07)
Basophils Absolute: 0 10*3/uL (ref 0.0–0.1)
Basophils Relative: 1 %
Eosinophils Absolute: 0.2 10*3/uL (ref 0.0–0.5)
Eosinophils Relative: 3 %
HCT: 39.4 % (ref 39.0–52.0)
Hemoglobin: 13.5 g/dL (ref 13.0–17.0)
Immature Granulocytes: 1 %
Lymphocytes Relative: 13 %
Lymphs Abs: 0.9 10*3/uL (ref 0.7–4.0)
MCH: 33.9 pg (ref 26.0–34.0)
MCHC: 34.3 g/dL (ref 30.0–36.0)
MCV: 99 fL (ref 80.0–100.0)
Monocytes Absolute: 0.7 10*3/uL (ref 0.1–1.0)
Monocytes Relative: 11 %
Neutro Abs: 4.6 10*3/uL (ref 1.7–7.7)
Neutrophils Relative %: 71 %
Platelets: 172 10*3/uL (ref 150–400)
RBC: 3.98 MIL/uL — ABNORMAL LOW (ref 4.22–5.81)
RDW: 12.8 % (ref 11.5–15.5)
WBC: 6.3 10*3/uL (ref 4.0–10.5)
nRBC: 0 % (ref 0.0–0.2)

## 2018-08-08 LAB — VITAMIN B12: Vitamin B-12: 214 pg/mL (ref 180–914)

## 2018-08-08 LAB — FOLATE: Folate: 4.8 ng/mL — ABNORMAL LOW (ref >5.9)

## 2018-08-08 MED ORDER — HEPARIN SOD (PORK) LOCK FLUSH 100 UNIT/ML IV SOLN
500.0000 [IU] | Freq: Once | INTRAVENOUS | Status: AC | PRN
  Administered 2018-08-08: 500 [IU]
  Filled 2018-08-08: qty 5

## 2018-08-08 MED ORDER — FOLIC ACID 1 MG PO TABS: 1.0000 mg | ORAL_TABLET | Freq: Every day | ORAL | 1 refills | Status: DC

## 2018-08-08 MED ORDER — VITAMIN B-12 1000 MCG PO TABS: 1000.0000 ug | ORAL_TABLET | Freq: Every day | ORAL | 1 refills | Status: DC

## 2018-08-08 MED ORDER — SODIUM CHLORIDE 0.9 % IV SOLN
Freq: Once | INTRAVENOUS | Status: AC
  Administered 2018-08-08: 10:00:00 via INTRAVENOUS
  Filled 2018-08-08: qty 250

## 2018-08-08 MED ORDER — SODIUM CHLORIDE 0.9% FLUSH
10.0000 mL | Freq: Once | INTRAVENOUS | Status: AC
  Administered 2018-08-08: 10 mL via INTRAVENOUS
  Filled 2018-08-08: qty 10

## 2018-08-08 MED ORDER — SODIUM CHLORIDE 0.9 % IV SOLN
10.0000 mg/kg | Freq: Once | INTRAVENOUS | Status: AC
  Administered 2018-08-08: 11:00:00 700 mg via INTRAVENOUS
  Filled 2018-08-08: qty 10

## 2018-08-12 ENCOUNTER — Ambulatory Visit: Payer: BLUE CROSS/BLUE SHIELD

## 2018-08-12 ENCOUNTER — Ambulatory Visit: Payer: BLUE CROSS/BLUE SHIELD | Admitting: Oncology

## 2018-08-12 ENCOUNTER — Other Ambulatory Visit: Payer: BLUE CROSS/BLUE SHIELD

## 2018-08-20 ENCOUNTER — Other Ambulatory Visit: Payer: Self-pay

## 2018-08-20 ENCOUNTER — Telehealth: Payer: Self-pay | Admitting: Physician Assistant

## 2018-08-20 NOTE — Telephone Encounter (Signed)
° °  VIDEO/MyChart App visit on 08/27/2018    Consent sent via MyChart    -  08/20/2018

## 2018-08-21 ENCOUNTER — Encounter: Payer: Self-pay | Admitting: Oncology

## 2018-08-21 ENCOUNTER — Inpatient Hospital Stay (HOSPITAL_BASED_OUTPATIENT_CLINIC_OR_DEPARTMENT_OTHER): Payer: BLUE CROSS/BLUE SHIELD | Admitting: Oncology

## 2018-08-21 DIAGNOSIS — R748 Abnormal levels of other serum enzymes: Secondary | ICD-10-CM

## 2018-08-21 DIAGNOSIS — E538 Deficiency of other specified B group vitamins: Secondary | ICD-10-CM | POA: Diagnosis not present

## 2018-08-21 DIAGNOSIS — C3431 Malignant neoplasm of lower lobe, right bronchus or lung: Secondary | ICD-10-CM

## 2018-08-21 DIAGNOSIS — Z5112 Encounter for antineoplastic immunotherapy: Secondary | ICD-10-CM

## 2018-08-21 NOTE — Progress Notes (Signed)
HEMATOLOGY-ONCOLOGY TeleHEALTH VISIT PROGRESS NOTE  I connected with Timothy Yoder on 08/21/18 at  1:15 PM EDT by video enabled telemedicine visit and verified that I am speaking with the correct person using two identifiers. I discussed the limitations, risks, security and privacy concerns of performing an evaluation and management service by telemedicine and the availability of in-person appointments. I also discussed with the patient that there may be a patient responsible charge related to this service. The patient expressed understanding and agreed to proceed.   Other persons participating in the visit and their role in the encounter:  Geraldine Solar, CMA, check in patient     Patient's location: Home  Provider's location:home  Chief Complaint: Follow-up for evaluation prior to maintenance durvalumab lung cancer.  INTERVAL HISTORY Timothy Yoder is a 63 y.o. male who has above history reviewed by me today presents for follow up visit for management of immunotherapy treatment for lung cancer Problems and complaints are listed below:  Patient reports feeling at baseline.  Denies any skin rash, diarrhea, cough, shortness of breath. No new complaints today.  Review of Systems  Constitutional: Negative for appetite change, chills, fatigue, fever and unexpected weight change.  HENT:   Negative for hearing loss and voice change.   Eyes: Negative for eye problems and icterus.  Respiratory: Negative for chest tightness, cough and shortness of breath.   Cardiovascular: Negative for chest pain and leg swelling.  Gastrointestinal: Negative for abdominal distention and abdominal pain.  Endocrine: Negative for hot flashes.  Genitourinary: Negative for difficulty urinating, dysuria and frequency.   Musculoskeletal: Negative for arthralgias.  Skin: Negative for itching and rash.       As described in the HPI  Neurological: Negative for light-headedness and numbness.  Hematological: Negative for  adenopathy. Does not bruise/bleed easily.  Psychiatric/Behavioral: Negative for confusion.    Past Medical History:  Diagnosis Date  . BPH (benign prostatic hyperplasia)   . CAD (coronary artery disease)    a. 04/2014 anterolateral STEMI/PCI: LM nl, LAD 5m(2.75x16 Promus DES, D1 50p, LCX nl, RI nl, OM1 100 small/chronic, RCA dominant, 20-364m7058m(FFR 0.90->Med Rx), RPDA 50, RPLA 70, EF 60%.  . CMarland KitchenPD (chronic obstructive pulmonary disease) (HCC)    Dr FleRaul Del Former tobacco use   . Gastroesophageal reflux disease   . HTN (hypertension)   . Hyperlipidemia   . Hypokalemia   . Malignant neoplasm of lung ([facility]2/12/2017  . Myocardial infarction ([facility]1/2016   Dr MicSherren Mocha Stented coronary artery    Past Surgical History:  Procedure Laterality Date  . CARDIAC CATHETERIZATION    . CHOLECYSTECTOMY N/A 02/15/2016   Procedure: LAPAROSCOPIC CHOLECYSTECTOMY WITH INTRAOPERATIVE CHOLANGIOGRAM;  Surgeon: JarLeonie GreenD;  Location: [facility] ORS;  Service: General;  Laterality: N/A;  . COLONOSCOPY    . COLONOSCOPY WITH PROPOFOL N/A 08/02/2015   Procedure: COLONOSCOPY WITH PROPOFOL;  Surgeon: RobManya SilvasD;  [facility];  Service: Endoscopy;  Laterality: N/A;  . CORONARY ANGIOPLASTY  04/2014   STENT  . ENDOBRONCHIAL ULTRASOUND N/A 03/26/2018   Procedure: ENDOBRONCHIAL ULTRASOUND;  Surgeon: KasFlora LippsD;  Location: [facility] ORS;  Service: Cardiopulmonary;  Laterality: N/A;  . ESOPHAGOGASTRODUODENOSCOPY (EGD) WITH PROPOFOL N/A 08/02/2015   Procedure: ESOPHAGOGASTRODUODENOSCOPY (EGD) WITH PROPOFOL;  Surgeon: RobManya SilvasD;  [facility];  Service: Endoscopy;  Laterality: N/A;  . FLEXIBLE BRONCHOSCOPY N/A 03/26/2018   Procedure: FLEXIBLE BRONCHOSCOPY;  Surgeon: KasFlora LippsD;  [facility];  Service: Cardiopulmonary;  Laterality: N/A;  . KNEE ARTHROSCOPY Left   . LEFT HEART CATHETERIZATION WITH CORONARY ANGIOGRAM N/A 05/03/2014    Procedure: LEFT HEART CATHETERIZATION WITH CORONARY ANGIOGRAM;  Surgeon: Blane Ohara, MD;  Location: [facility] CATH LAB;  Service: Cardiovascular;  Laterality: N/A;  . PERCUTANEOUS CORONARY STENT INTERVENTION (PCI-S) N/A 05/05/2014   Procedure: PERCUTANEOUS CORONARY STENT INTERVENTION (PCI-S);  Surgeon: Peter M Martinique, MD;  Location: [facility] CATH LAB;  Service: Cardiovascular;  Laterality: N/A;  . PORTACATH PLACEMENT Left 04/04/2018   Procedure: INSERTION PORT-A-CATH;  Surgeon: [name], MD;  Location: [facility] ORS;  Service: General;  Laterality: Left;    Family History  Problem Relation Age of Onset  . CAD Mother 58       Died of an MI  . CAD Father 30       Died of a massive MI  . COPD Sister   . Lung cancer Brother   . Bone cancer Paternal Uncle   . Colon cancer Neg Hx   . Breast cancer Neg Hx     Social History   Socioeconomic History  . Marital status: Married    Spouse name: Not on file  . Number of children: Not on file  . Years of education: Not on file  . Highest education level: Not on file  Occupational History  . Not on file  Social Needs  . Financial resource strain: Not on file  . Food insecurity:    Worry: Not on file    Inability: Not on file  . Transportation needs:    Medical: Not on file    Non-medical: Not on file  Tobacco Use  . Smoking status: Former Smoker    Packs/day: 1.00    Years: 40.00    Pack years: 40.00    Types: Cigarettes    Last attempt to quit: 02/14/2011    Years since quitting: 7.5  . Smokeless tobacco: Never Used  Substance and Sexual Activity  . Alcohol use: No  . Drug use: No  . Sexual activity: Not on file  Lifestyle  . Physical activity:    Days per week: Not on file    Minutes per session: Not on file  . Stress: Not on file  Relationships  . Social connections:    Talks on phone: Not on file    Gets together: Not on file    Attends religious service: Not on file    Active member of club or organization: Not on file     Attends meetings of clubs or organizations: Not on file    Relationship status: Not on file  . Intimate partner violence:    Fear of current or ex partner: Not on file    Emotionally abused: Not on file    Physically abused: Not on file    Forced sexual activity: Not on file  Other Topics Concern  . Not on file  Social History Narrative   The patient is married. He lives in [place]. He works full-time. He quit smoking in 2013.    Current Outpatient Medications on File Prior to Visit  Medication Sig Dispense Refill  . acetaminophen (TYLENOL) 500 MG tablet Take 1,000 mg by mouth every 6 (six) hours as needed (for pain.).    Marland Kitchen albuterol (PROAIR HFA) 108 (90 Base) MCG/ACT inhaler Inhale 2 puffs into the lungs every 6 (six) hours as needed for wheezing or shortness of breath.     Marland Kitchen aspirin EC 81  MG tablet Take 81 mg by mouth daily.    Marland Kitchen atorvastatin (LIPITOR) 80 MG tablet Take 1 tablet (80 mg total) by mouth daily at 6 PM. 90 tablet 3  . folic acid (FOLVITE) 1 MG tablet Take 1 tablet (1 mg total) by mouth daily. 90 tablet 1  . lisinopril (PRINIVIL,ZESTRIL) 10 MG tablet Take 1 tablet (10 mg total) by mouth daily. 90 tablet 3  . nitroGLYCERIN (NITROSTAT) 0.4 MG SL tablet Place 1 tablet (0.4 mg total) under the tongue every 5 (five) minutes as needed for chest pain. X 3 doses 25 tablet 3  . omeprazole (PRILOSEC) 40 MG capsule Take 1 capsule (40 mg total) by mouth 2 (two) times daily. 60 capsule 1  . potassium chloride SA (KLOR-CON M20) 20 MEQ tablet Take 1 tablet (20 mEq total) by mouth daily. 90 tablet 1  . sucralfate (CARAFATE) 1 g tablet Take 1 tablet (1 g total) by mouth 3 (three) times daily before meals. Dissolve in warm water, swish and swallow 90 tablet 6  . tamsulosin (FLOMAX) 0.4 MG CAPS capsule Take 0.4 mg by mouth daily after supper.    . traZODone (DESYREL) 50 MG tablet Take 1 tablet (50 mg total) by mouth at bedtime. 90 tablet 0  . umeclidinium-vilanterol (ANORO ELLIPTA) 62.5-25  MCG/INH AEPB Inhale 1 puff into the lungs every morning.    . vitamin B-12 (CYANOCOBALAMIN) 1000 MCG tablet Take 1 tablet (1,000 mcg total) by mouth daily. 90 tablet 1  . carvedilol (COREG) 6.25 MG tablet Take 1 tablet (6.25 mg total) by mouth 2 (two) times daily. 180 tablet 3  . colestipol (COLESTID) 1 g tablet Take 2 g by mouth daily as needed (stomach issues).     . [DISCONTINUED] prochlorperazine (COMPAZINE) 10 MG tablet Take 1 tablet (10 mg total) by mouth every 6 (six) hours as needed (Nausea or vomiting). 30 tablet 1   No current facility-administered medications on file prior to visit.     No Known Allergies     Observations/Objective: There were no vitals filed for this visit. There is no height or weight on file to calculate BMI.  Pain level 0 Physical Exam  Constitutional: He is oriented to person, place, and time and well-developed, well-nourished, and in no distress. No distress.  HENT:  Head: Normocephalic and atraumatic.  Eyes: EOM are normal.  Pulmonary/Chest: Effort normal.  Neurological: He is alert and oriented to person, place, and time.  Skin: No rash noted.   CBC    Component Value Date/Time   WBC 6.3 08/08/2018 0933   RBC 3.98 (L) 08/08/2018 0933   HGB 13.5 08/08/2018 0933   HGB 15.1 05/03/2014 1457   HCT 39.4 08/08/2018 0933   HCT 44.8 05/03/2014 1457   PLT 172 08/08/2018 0933   PLT 234 05/03/2014 1457   MCV 99.0 08/08/2018 0933   MCV 96 05/03/2014 1457   MCH 33.9 08/08/2018 0933   MCHC 34.3 08/08/2018 0933   RDW 12.8 08/08/2018 0933   RDW 13.3 05/03/2014 1457   LYMPHSABS 0.9 08/08/2018 0933   MONOABS 0.7 08/08/2018 0933   EOSABS 0.2 08/08/2018 0933   BASOSABS 0.0 08/08/2018 0933    CMP     Component Value Date/Time   NA 137 08/08/2018 0933   NA 140 08/12/2015 0827   NA 142 05/03/2014 1457   K 3.5 08/08/2018 0933   K 3.3 (L) 05/03/2014 1457   CL 107 08/08/2018 0933   CL 107 05/03/2014 1457   CO2  25 08/08/2018 0933   CO2 26 05/03/2014  1457   GLUCOSE 106 (H) 08/08/2018 0933   GLUCOSE 89 05/03/2014 1457   BUN 8 08/08/2018 0933   BUN 10 08/12/2015 0827   BUN 10 05/03/2014 1457   CREATININE 1.00 08/08/2018 0933   CREATININE 1.11 05/03/2014 1457   CALCIUM 9.0 08/08/2018 0933   CALCIUM 8.3 (L) 05/03/2014 1457   PROT 7.2 08/08/2018 0933   PROT 6.5 08/12/2015 0827   ALBUMIN 3.8 08/08/2018 0933   ALBUMIN 4.0 08/12/2015 0827   AST 27 08/08/2018 0933   ALT 24 08/08/2018 0933   ALKPHOS 187 (H) 08/08/2018 0933   BILITOT 0.9 08/08/2018 0933   BILITOT 0.4 08/12/2015 0827   GFRNONAA >60 08/08/2018 0933   GFRNONAA >60 05/03/2014 1457   GFRAA >60 08/08/2018 0933   GFRAA >60 05/03/2014 1457     Assessment and Plan: 1. Malignant neoplasm of lower lobe of right lung ([place])   2. Encounter for antineoplastic immunotherapy   3. Folate deficiency   4. Elevated alkaline phosphatase level     #So far patient has tolerated immunotherapy well. No clinical signs of immunotherapy related toxicities. He is going to proceed with CBC and CMP TSH on 08/22/2018.  If lab results and vital signs within treatment criteria, he is going to receive durvalumab treatment on 08/22/2018. Last surveillance CT was done on 07/05/2018.  Continue surveillance CT scan.  Next due around 10/05/2018. These are new problems. Advise patient to take oral folic acid 69m daily and vitamin b12 10053mdaily.   Folate deficiency and low vitamin B12 level, continue folic acid and oral vitamin B12 daily. Elevated alkaline phosphatase, continue to trend.  Bone scan negative.  Follow Up Instructions: ~2 weeks.  For assessment prior to next immunotherapy treatment.  I discussed the assessment and treatment plan with the patient. The patient was provided an opportunity to ask questions and all were answered. The patient agreed with the plan and demonstrated an understanding of the instructions.  The patient was advised to call back or seek an in-person evaluation if the  symptoms worsen or if the condition fails to improve as anticipated.  ZhEarlie ServerMD 08/21/2018 9:55 PM

## 2018-08-21 NOTE — Progress Notes (Signed)
Patient contacted via phone for virtual visit. No concerns voiced.

## 2018-08-22 ENCOUNTER — Inpatient Hospital Stay: Payer: BLUE CROSS/BLUE SHIELD

## 2018-08-22 ENCOUNTER — Other Ambulatory Visit: Payer: Self-pay

## 2018-08-22 VITALS — BP 118/74 | HR 80 | Temp 97.6°F | Resp 18 | Wt 152.5 lb

## 2018-08-22 DIAGNOSIS — C3431 Malignant neoplasm of lower lobe, right bronchus or lung: Secondary | ICD-10-CM

## 2018-08-22 DIAGNOSIS — Z5112 Encounter for antineoplastic immunotherapy: Secondary | ICD-10-CM | POA: Diagnosis not present

## 2018-08-22 LAB — COMPREHENSIVE METABOLIC PANEL
ALT: 15 U/L (ref 0–44)
AST: 21 U/L (ref 15–41)
Albumin: 3.5 g/dL (ref 3.5–5.0)
Alkaline Phosphatase: 187 U/L — ABNORMAL HIGH (ref 38–126)
Anion gap: 8 (ref 5–15)
BUN: 10 mg/dL (ref 8–23)
CO2: 25 mmol/L (ref 22–32)
Calcium: 8.7 mg/dL — ABNORMAL LOW (ref 8.9–10.3)
Chloride: 104 mmol/L (ref 98–111)
Creatinine, Ser: 0.93 mg/dL (ref 0.61–1.24)
GFR calc Af Amer: >60 mL/min (ref >60)
GFR calc non Af Amer: >60 mL/min (ref >60)
Glucose, Bld: 106 mg/dL — ABNORMAL HIGH (ref 70–99)
Potassium: 3.7 mmol/L (ref 3.5–5.1)
Sodium: 137 mmol/L (ref 135–145)
Total Bilirubin: 0.7 mg/dL (ref 0.3–1.2)
Total Protein: 6.6 g/dL (ref 6.5–8.1)

## 2018-08-22 LAB — CBC WITH DIFFERENTIAL/PLATELET
Abs Immature Granulocytes: 0.06 10*3/uL (ref 0.00–0.07)
Basophils Absolute: 0.1 10*3/uL (ref 0.0–0.1)
Basophils Relative: 1 %
Eosinophils Absolute: 0.3 10*3/uL (ref 0.0–0.5)
Eosinophils Relative: 4 %
HCT: 36.2 % — ABNORMAL LOW (ref 39.0–52.0)
Hemoglobin: 12.4 g/dL — ABNORMAL LOW (ref 13.0–17.0)
Immature Granulocytes: 1 %
Lymphocytes Relative: 9 %
Lymphs Abs: 0.6 10*3/uL — ABNORMAL LOW (ref 0.7–4.0)
MCH: 33.4 pg (ref 26.0–34.0)
MCHC: 34.3 g/dL (ref 30.0–36.0)
MCV: 97.6 fL (ref 80.0–100.0)
Monocytes Absolute: 0.9 10*3/uL (ref 0.1–1.0)
Monocytes Relative: 14 %
Neutro Abs: 4.4 10*3/uL (ref 1.7–7.7)
Neutrophils Relative %: 71 %
Platelets: 201 10*3/uL (ref 150–400)
RBC: 3.71 MIL/uL — ABNORMAL LOW (ref 4.22–5.81)
RDW: 11.9 % (ref 11.5–15.5)
WBC: 6.2 10*3/uL (ref 4.0–10.5)
nRBC: 0 % (ref 0.0–0.2)

## 2018-08-22 LAB — TSH: TSH: 1.077 u[IU]/mL (ref 0.350–4.500)

## 2018-08-22 MED ORDER — HEPARIN SOD (PORK) LOCK FLUSH 100 UNIT/ML IV SOLN: 500.0000 [IU] | Freq: Once | INTRAVENOUS | Status: DC | PRN

## 2018-08-22 MED ORDER — HEPARIN SOD (PORK) LOCK FLUSH 100 UNIT/ML IV SOLN
500.0000 [IU] | Freq: Once | INTRAVENOUS | Status: AC
  Administered 2018-08-22: 500 [IU] via INTRAVENOUS
  Filled 2018-08-22: qty 5

## 2018-08-22 MED ORDER — SODIUM CHLORIDE 0.9 % IV SOLN
Freq: Once | INTRAVENOUS | Status: AC
  Administered 2018-08-22: 10:00:00 via INTRAVENOUS
  Filled 2018-08-22: qty 250

## 2018-08-22 MED ORDER — SODIUM CHLORIDE 0.9% FLUSH
10.0000 mL | INTRAVENOUS | Status: DC | PRN
  Administered 2018-08-22: 10:00:00 10 mL via INTRAVENOUS
  Filled 2018-08-22: qty 10

## 2018-08-22 MED ORDER — SODIUM CHLORIDE 0.9 % IV SOLN
10.0000 mg/kg | Freq: Once | INTRAVENOUS | Status: AC
  Administered 2018-08-22: 11:00:00 700 mg via INTRAVENOUS
  Filled 2018-08-22: qty 10

## 2018-08-22 NOTE — Addendum Note (Signed)
Addended by: Earlie Server on: 08/22/2018 10:18 AM   Modules accepted: Orders

## 2018-08-26 ENCOUNTER — Other Ambulatory Visit: Payer: BLUE CROSS/BLUE SHIELD

## 2018-08-26 ENCOUNTER — Ambulatory Visit: Payer: BLUE CROSS/BLUE SHIELD

## 2018-08-26 NOTE — Progress Notes (Signed)
Virtual Visit via Video Note   This visit type was conducted due to national recommendations for restrictions regarding the COVID-19 Pandemic (e.g. social distancing) in an effort to limit this patient's exposure and mitigate transmission in our community.  Due to his co-morbid illnesses, this patient is at least at moderate risk for complications without adequate follow up.  This format is felt to be most appropriate for this patient at this time.  All issues noted in this document were discussed and addressed.  A limited physical exam was performed with this format.  Please refer to the patient's chart for his consent to telehealth for [facility].   Date:  08/27/2018   ID:  Timothy Yoder, DOB 1956-02-13, MRN [id number]  Patient Location: Home Provider Location: Home  PCP:  Baxter Hire, MD  Cardiologist:  Sherren Mocha, MD   Electrophysiologist:  None   Evaluation Performed:  Follow-Up Visit  Chief Complaint:  CAD  History of Present Illness:    Timothy Yoder is a 63 y.o. male with coronary artery disease s/p ant-lat STEMI in 2016 treated with a DES to the mid LAD.  At the time of his MI, he had mod disease in the RCA which was negative by pressure wire analysis.  He was last seen by Dr. Burt Knack in 05/2017.  Since then, he has been diagnosed with non-small cell lung cancer and is undergoing chemotherapy.    Today, he notes he is doing well.  He has not had chest discomfort or significant shortness of breath.  He does have chronic shortness of breath related to COPD.  He has not had orthopnea, paroxysmal nocturnal dyspnea or significant lower extremity swelling.  He has not had syncope.  The patient does not have symptoms concerning for COVID-19 infection (fever, chills, cough, or new shortness of breath).    Past Medical History:  Diagnosis Date  . BPH (benign prostatic hyperplasia)   . CAD (coronary artery disease)    a. 04/2014 anterolateral STEMI/PCI: LM nl, LAD 47m (2.75x16 Promus DES, D1 50p, LCX nl, RI nl, OM1 100 small/chronic, RCA dominant, 20-333m7048m(FFR 0.90->Med Rx), RPDA 50, RPLA 70, EF 60%.  . CMarland KitchenPD (chronic obstructive pulmonary disease) (HCC)    Dr FleRaul Del Former tobacco use   . Gastroesophageal reflux disease   . HTN (hypertension)   . Hyperlipidemia   . Hypokalemia   . Malignant neoplasm of lung ([place]CC[place]2/12/2017  . Myocardial infarction (HCC[place]1/2016   Dr MicSherren Mocha Stented coronary artery    Past Surgical History:  Procedure Laterality Date  . CARDIAC CATHETERIZATION    . CHOLECYSTECTOMY N/A 02/15/2016   Procedure: LAPAROSCOPIC CHOLECYSTECTOMY WITH INTRAOPERATIVE CHOLANGIOGRAM;  Surgeon: JarLeonie GreenD;  Location: [facility] ORS;  Service: General;  Laterality: N/A;  . COLONOSCOPY    . COLONOSCOPY WITH PROPOFOL N/A 08/02/2015   Procedure: COLONOSCOPY WITH PROPOFOL;  Surgeon: RobManya SilvasD;  [facility];  Service: Endoscopy;  Laterality: N/A;  . CORONARY ANGIOPLASTY  04/2014   STENT  . ENDOBRONCHIAL ULTRASOUND N/A 03/26/2018   Procedure: ENDOBRONCHIAL ULTRASOUND;  Surgeon: KasFlora LippsD;  Location: [facility] ORS;  Service: Cardiopulmonary;  Laterality: N/A;  . ESOPHAGOGASTRODUODENOSCOPY (EGD) WITH PROPOFOL N/A 08/02/2015   Procedure: ESOPHAGOGASTRODUODENOSCOPY (EGD) WITH PROPOFOL;  Surgeon: RobManya SilvasD;  [facility];  Service: Endoscopy;  Laterality: N/A;  . FLEXIBLE BRONCHOSCOPY N/A 03/26/2018   Procedure: FLEXIBLE BRONCHOSCOPY;  Surgeon: KasFlora LippsD;  Location:  [facility] ORS;  Service: Cardiopulmonary;  Laterality: N/A;  . KNEE ARTHROSCOPY Left   . LEFT HEART CATHETERIZATION WITH CORONARY ANGIOGRAM N/A 05/03/2014   Procedure: LEFT HEART CATHETERIZATION WITH CORONARY ANGIOGRAM;  Surgeon: Blane Ohara, MD;  Location: [facility] CATH LAB;  Service: Cardiovascular;  Laterality: N/A;  . PERCUTANEOUS CORONARY STENT INTERVENTION (PCI-S) N/A 05/05/2014   Procedure: PERCUTANEOUS CORONARY STENT  INTERVENTION (PCI-S);  Surgeon: Peter M Martinique, MD;  Location: [facility] CATH LAB;  Service: Cardiovascular;  Laterality: N/A;  . PORTACATH PLACEMENT Left 04/04/2018   Procedure: INSERTION PORT-A-CATH;  Surgeon: [name], MD;  Location: [facility] ORS;  Service: General;  Laterality: Left;     Current Meds  Medication Sig  . acetaminophen (TYLENOL) 500 MG tablet Take 1,000 mg by mouth every 6 (six) hours as needed (for pain.).  Marland Kitchen albuterol (PROAIR HFA) 108 (90 Base) MCG/ACT inhaler Inhale 2 puffs into the lungs every 6 (six) hours as needed for wheezing or shortness of breath.   Marland Kitchen aspirin EC 81 MG tablet Take 81 mg by mouth daily.  Marland Kitchen atorvastatin (LIPITOR) 80 MG tablet Take 1 tablet (80 mg total) by mouth daily at 6 PM.  . colestipol (COLESTID) 1 g tablet Take 2 g by mouth as needed (for stomach issues).   . folic acid (FOLVITE) 1 MG tablet Take 1 tablet (1 mg total) by mouth daily.  Marland Kitchen lisinopril (ZESTRIL) 10 MG tablet Take 1 tablet (10 mg total) by mouth daily.  . nitroGLYCERIN (NITROSTAT) 0.4 MG SL tablet Place 1 tablet (0.4 mg total) under the tongue every 5 (five) minutes as needed for chest pain. X 3 doses  . omeprazole (PRILOSEC) 40 MG capsule Take 1 capsule (40 mg total) by mouth 2 (two) times daily.  . potassium chloride SA (KLOR-CON M20) 20 MEQ tablet Take 1 tablet (20 mEq total) by mouth daily.  . sucralfate (CARAFATE) 1 g tablet Take 1 tablet (1 g total) by mouth 3 (three) times daily before meals. Dissolve in warm water, swish and swallow  . tamsulosin (FLOMAX) 0.4 MG CAPS capsule Take 0.4 mg by mouth daily after supper.  . traZODone (DESYREL) 50 MG tablet Take 1 tablet (50 mg total) by mouth at bedtime.  Marland Kitchen umeclidinium-vilanterol (ANORO ELLIPTA) 62.5-25 MCG/INH AEPB Inhale 1 puff into the lungs every morning.  . vitamin B-12 (CYANOCOBALAMIN) 1000 MCG tablet Take 1 tablet (1,000 mcg total) by mouth daily.  . [DISCONTINUED] colestipol (COLESTID) 1 g tablet Take 2 g by mouth daily as needed  (stomach issues).      Allergies:   Patient has no known allergies.   Social History   Tobacco Use  . Smoking status: Former Smoker    Packs/day: 1.00    Years: 40.00    Pack years: 40.00    Types: Cigarettes    Last attempt to quit: 02/14/2011    Years since quitting: 7.5  . Smokeless tobacco: Never Used  Substance Use Topics  . Alcohol use: No  . Drug use: No     Family Hx: The patient's family history includes Bone cancer in his paternal uncle; CAD (age of onset: 39) in his father; CAD (age of onset: 13) in his mother; COPD in his sister; Lung cancer in his brother. There is no history of Colon cancer or Breast cancer.  ROS:   Please see the history of present illness.    All other systems reviewed and are negative.   Prior CV studies:   The following studies were reviewed  today:  Stress Echo 03/02/15 Impressions: - Normal study after maximal exercise. Low risk study.  FFR 05/05/14 Conclusions: FFR of 0.90 of the mid RCA Med Rx  Cardiac Catheterization 05/03/14 LM patent  LAD mid 85; D1 prox 50 LCx patent; OM1 100 (small; CTO) RCA mid 20-30, mid-dist 70; PDA mid 50; PlA 70 EF 60 PCI:  2.75 x 16 mm Promus DES to mid LAD  Labs/Other Tests and Data Reviewed:    EKG:  No ECG reviewed.  Recent Labs: 08/22/2018: ALT 15; BUN 10; Creatinine, Ser 0.93; Hemoglobin 12.4; Platelets 201; Potassium 3.7; Sodium 137; TSH 1.077   Recent Lipid Panel Lab Results  Component Value Date/Time   CHOL 104 08/12/2015 08:27 AM   TRIG 55 08/12/2015 08:27 AM   HDL 35 (L) 08/12/2015 08:27 AM   CHOLHDL 3.0 08/12/2015 08:27 AM   CHOLHDL 3 07/31/2014 07:36 AM   LDLCALC 58 08/12/2015 08:27 AM     Wt Readings from Last 3 Encounters:  08/27/18 149 lb (67.6 kg)  08/22/18 152 lb 8 oz (69.2 kg)  08/08/18 152 lb 12.8 oz (69.3 kg)     Objective:    Vital Signs:  BP 132/80   Pulse 91   Ht _0  (1.727 m)   Wt 149 lb (67.6 kg)   BMI 22.66 kg/m    VITAL SIGNS:  reviewed GEN:   no acute distress RESPIRATORY:  Normal respiratory effort NEURO:  alert and oriented x 3, no obvious focal deficit PSYCH:  normal affect  ASSESSMENT & PLAN:     Coronary artery disease involving native coronary artery of native heart without angina pectoris History of anterolateral ST elevation myocardial infarction in 2016 treated with a drug-eluting stent to the LAD.  He had moderate disease in RCA that was negative by pressure wire analysis.  He has done well without anginal symptoms.  Continue aspirin, statin, beta-blocker, ACE inhibitor.  Malignant neoplasm of lower lobe of right lung [facility]) He has completed chemotherapy and radiation therapy.  He is currently receiving immunotherapy.  Essential hypertension The patient's blood pressure is controlled on his current regimen.  Continue current therapy.   Hyperlipidemia, unspecified hyperlipidemia type Continue high-dose statin therapy.  Follow-up lipid panel will need to be arranged at his next in person visit.  COVID-19 Education: The signs and symptoms of COVID-19 were discussed with the patient and how to seek care for testing (follow up with PCP or arrange E-visit).  The importance of social distancing was discussed today.  Time:   Today, I have spent 10 minutes with the patient with telehealth technology discussing the above problems.     Medication Adjustments/Labs and Tests Ordered: Current medicines are reviewed at length with the patient today.  Concerns regarding medicines are outlined above.   Tests Ordered: No orders of the defined types were placed in this encounter.   Medication Changes: Meds ordered this encounter  Medications  . potassium chloride SA (KLOR-CON M20) 20 MEQ tablet    Sig: Take 1 tablet (20 mEq total) by mouth daily.    Dispense:  90 tablet    Refill:  3    Order Specific Question:   Supervising Provider    Answer:   Lelon Perla [[id number]]  . atorvastatin (LIPITOR) 80 MG tablet    Sig:  Take 1 tablet (80 mg total) by mouth daily at 6 PM.    Dispense:  90 tablet    Refill:  3    Order Specific Question:  Supervising Provider    Answer:   Lelon Perla [[id number]]  . lisinopril (ZESTRIL) 10 MG tablet    Sig: Take 1 tablet (10 mg total) by mouth daily.    Dispense:  90 tablet    Refill:  3    Order Specific Question:   Supervising Provider    Answer:   Lelon Perla [[id number]]  . carvedilol (COREG) 6.25 MG tablet    Sig: Take 1 tablet (6.25 mg total) by mouth 2 (two) times daily.    Dispense:  180 tablet    Refill:  3    Order Specific Question:   Supervising Provider    Answer:   Lelon Perla [[id number]]    Disposition:  Follow up in 1 year(s)  Signed, Richardson Dopp, PA-C  08/27/2018 9:32 AM    [place]

## 2018-08-27 ENCOUNTER — Telehealth (INDEPENDENT_AMBULATORY_CARE_PROVIDER_SITE_OTHER): Payer: BLUE CROSS/BLUE SHIELD | Admitting: Physician Assistant

## 2018-08-27 ENCOUNTER — Encounter: Payer: Self-pay | Admitting: Physician Assistant

## 2018-08-27 VITALS — BP 132/80 | HR 91 | Ht 68.0 in | Wt 149.0 lb

## 2018-08-27 DIAGNOSIS — E785 Hyperlipidemia, unspecified: Secondary | ICD-10-CM | POA: Diagnosis not present

## 2018-08-27 DIAGNOSIS — I10 Essential (primary) hypertension: Secondary | ICD-10-CM | POA: Diagnosis not present

## 2018-08-27 DIAGNOSIS — I2510 Atherosclerotic heart disease of native coronary artery without angina pectoris: Secondary | ICD-10-CM

## 2018-08-27 DIAGNOSIS — C3431 Malignant neoplasm of lower lobe, right bronchus or lung: Secondary | ICD-10-CM | POA: Diagnosis not present

## 2018-08-27 DIAGNOSIS — Z7189 Other specified counseling: Secondary | ICD-10-CM

## 2018-08-27 MED ORDER — LISINOPRIL 10 MG PO TABS: 10.0000 mg | ORAL_TABLET | Freq: Every day | ORAL | 3 refills | Status: DC

## 2018-08-27 MED ORDER — CARVEDILOL 6.25 MG PO TABS: 6.2500 mg | ORAL_TABLET | Freq: Two times a day (BID) | ORAL | 3 refills | Status: DC

## 2018-08-27 MED ORDER — POTASSIUM CHLORIDE CRYS ER 20 MEQ PO TBCR: 20.0000 meq | EXTENDED_RELEASE_TABLET | Freq: Every day | ORAL | 3 refills | Status: DC

## 2018-08-27 MED ORDER — ATORVASTATIN CALCIUM 80 MG PO TABS: 80.0000 mg | ORAL_TABLET | Freq: Every day | ORAL | 3 refills | Status: DC

## 2018-08-27 NOTE — Patient Instructions (Signed)
Medication Instructions:  No changes. Refills for Atorvastatin, Carvedilol, Lisinopril and Potassium have been sent to your pharmacy.  If you need a refill on your cardiac medications before your next appointment, please call your pharmacy.   Lab work: None  If you have labs (blood work) drawn today and your tests are completely normal, you will receive your results only by: Marland Kitchen MyChart Message (if you have MyChart) OR . A paper copy in the mail If you have any lab test that is abnormal or we need to change your treatment, we will call you to review the results.  Testing/Procedures: None   Follow-Up: At [facility], you and your health needs are our priority.  As part of our continuing mission to provide you with exceptional heart care, we have created designated Provider Care Teams.  These Care Teams include your primary Cardiologist (physician) and Advanced Practice Providers (APPs -  Physician Assistants and Nurse Practitioners) who all work together to provide you with the care you need, when you need it. You will need a follow up appointment in:  12 months.  Please call our office 2 months in advance to schedule this appointment.  You may see Sherren Mocha, MD or Richardson Dopp, PA-C   Any Other Special Instructions Will Be Listed Below (If Applicable).

## 2018-09-05 ENCOUNTER — Other Ambulatory Visit: Payer: Self-pay

## 2018-09-06 ENCOUNTER — Inpatient Hospital Stay: Payer: BLUE CROSS/BLUE SHIELD | Attending: Oncology | Admitting: Oncology

## 2018-09-06 ENCOUNTER — Encounter: Payer: Self-pay | Admitting: Oncology

## 2018-09-06 DIAGNOSIS — T451X5A Adverse effect of antineoplastic and immunosuppressive drugs, initial encounter: Secondary | ICD-10-CM

## 2018-09-06 DIAGNOSIS — J449 Chronic obstructive pulmonary disease, unspecified: Secondary | ICD-10-CM | POA: Insufficient documentation

## 2018-09-06 DIAGNOSIS — Z7951 Long term (current) use of inhaled steroids: Secondary | ICD-10-CM | POA: Insufficient documentation

## 2018-09-06 DIAGNOSIS — C3431 Malignant neoplasm of lower lobe, right bronchus or lung: Secondary | ICD-10-CM | POA: Diagnosis not present

## 2018-09-06 DIAGNOSIS — I10 Essential (primary) hypertension: Secondary | ICD-10-CM | POA: Diagnosis not present

## 2018-09-06 DIAGNOSIS — Z79899 Other long term (current) drug therapy: Secondary | ICD-10-CM

## 2018-09-06 DIAGNOSIS — Z7982 Long term (current) use of aspirin: Secondary | ICD-10-CM | POA: Insufficient documentation

## 2018-09-06 DIAGNOSIS — Z87891 Personal history of nicotine dependence: Secondary | ICD-10-CM

## 2018-09-06 DIAGNOSIS — R5383 Other fatigue: Secondary | ICD-10-CM

## 2018-09-06 DIAGNOSIS — R748 Abnormal levels of other serum enzymes: Secondary | ICD-10-CM

## 2018-09-06 DIAGNOSIS — E785 Hyperlipidemia, unspecified: Secondary | ICD-10-CM | POA: Insufficient documentation

## 2018-09-06 DIAGNOSIS — E538 Deficiency of other specified B group vitamins: Secondary | ICD-10-CM | POA: Diagnosis not present

## 2018-09-06 DIAGNOSIS — D6481 Anemia due to antineoplastic chemotherapy: Secondary | ICD-10-CM

## 2018-09-06 DIAGNOSIS — Z5112 Encounter for antineoplastic immunotherapy: Secondary | ICD-10-CM

## 2018-09-06 DIAGNOSIS — N400 Enlarged prostate without lower urinary tract symptoms: Secondary | ICD-10-CM | POA: Insufficient documentation

## 2018-09-06 DIAGNOSIS — T451X5D Adverse effect of antineoplastic and immunosuppressive drugs, subsequent encounter: Secondary | ICD-10-CM | POA: Insufficient documentation

## 2018-09-06 NOTE — Progress Notes (Signed)
HEMATOLOGY-ONCOLOGY TeleHEALTH VISIT PROGRESS NOTE  I connected with Timothy Yoder on 09/06/18 at  2:00 PM EDT by video enabled telemedicine visit and verified that I am speaking with the correct person using two identifiers. I discussed the limitations, risks, security and privacy concerns of performing an evaluation and management service by telemedicine and the availability of in-person appointments. I also discussed with the patient that there may be a patient responsible charge related to this service. The patient expressed understanding and agreed to proceed.   Other persons participating in the visit and their role in the encounter:  Geraldine Solar, CMA, check in patient     Patient's location: Home  Provider's location:home  Chief Complaint: Follow-up for evaluation prior to maintenance durvalumab lung cancer.  INTERVAL HISTORY Timothy Yoder is a 63 y.o. male who has above history reviewed by me today presents for follow up visit for management of immunotherapy for treatment of Stage IIIA lung cancer.   Lung cancer, on immunotherapy. He has tolerated immunotherapy well.  Fatigue: reports worsening fatigue. Chronic onset, perisistent, no aggravating or improving factors, no associated symptoms. Folate deficiency, taking folic acid supplements.   Otherwise doing well.  Denies any skin rash, diarrhea, cough, shortness of breath or abdominal pain.   Review of Systems  Constitutional: Positive for fatigue. Negative for appetite change, chills, fever and unexpected weight change.  HENT:   Negative for hearing loss and voice change.   Eyes: Negative for eye problems and icterus.  Respiratory: Negative for chest tightness, cough and shortness of breath.   Cardiovascular: Negative for chest pain and leg swelling.  Gastrointestinal: Negative for abdominal distention and abdominal pain.  Endocrine: Negative for hot flashes.  Genitourinary: Negative for difficulty urinating, dysuria and  frequency.   Musculoskeletal: Negative for arthralgias.  Skin: Negative for itching and rash.  Neurological: Negative for light-headedness and numbness.  Hematological: Negative for adenopathy. Does not bruise/bleed easily.  Psychiatric/Behavioral: Negative for confusion.    Past Medical History:  Diagnosis Date  . BPH (benign prostatic hyperplasia)   . CAD (coronary artery disease)    a. 04/2014 anterolateral STEMI/PCI: LM nl, LAD 1m(2.75x16 Promus DES, D1 50p, LCX nl, RI nl, OM1 100 small/chronic, RCA dominant, [id number](FFR 0.90->Med Rx), RPDA 50, RPLA 70, EF 60%.  . CMarland KitchenPD (chronic obstructive pulmonary disease) (HCC)    Dr FleRaul Del Former tobacco use   . Gastroesophageal reflux disease   . HTN (hypertension)   . Hyperlipidemia   . Hypokalemia   . [place] of [place]  . [place] infarction ([place]   Dr MicSherren Mocha Stented coronary artery    Past Surgical History:  Procedure Laterality Date  . CARDIAC CATHETERIZATION    . CHOLECYSTECTOMY N/A 02/15/2016   Procedure: LAPAROSCOPIC CHOLECYSTECTOMY WITH INTRAOPERATIVE CHOLANGIOGRAM;  Surgeon: JarLeonie GreenD;  Location: [facility] ORS;  Service: General;  Laterality: N/A;  . COLONOSCOPY    . COLONOSCOPY WITH PROPOFOL N/A 08/02/2015   Procedure: COLONOSCOPY WITH PROPOFOL;  Surgeon: RobManya SilvasD;  [facility];  Service: Endoscopy;  Laterality: N/A;  . CORONARY ANGIOPLASTY  04/2014   STENT  . ENDOBRONCHIAL ULTRASOUND N/A 03/26/2018   Procedure: ENDOBRONCHIAL ULTRASOUND;  Surgeon: KasFlora LippsD;  Location: [facility] ORS;  Service: Cardiopulmonary;  Laterality: N/A;  . ESOPHAGOGASTRODUODENOSCOPY (EGD) WITH PROPOFOL N/A 08/02/2015   Procedure: ESOPHAGOGASTRODUODENOSCOPY (EGD) WITH PROPOFOL;  Surgeon: RobManya SilvasD;  [facility];  Service: Endoscopy;  Laterality:  N/A;  . FLEXIBLE BRONCHOSCOPY N/A 03/26/2018   Procedure: FLEXIBLE BRONCHOSCOPY;  Surgeon: Flora Lipps, MD;  Location: [facility] ORS;  Service: Cardiopulmonary;  Laterality: N/A;  . KNEE ARTHROSCOPY Left   . LEFT HEART CATHETERIZATION WITH CORONARY ANGIOGRAM N/A 05/03/2014   Procedure: LEFT HEART CATHETERIZATION WITH CORONARY ANGIOGRAM;  Surgeon: Blane Ohara, MD;  Location: [facility] CATH LAB;  Service: Cardiovascular;  Laterality: N/A;  . PERCUTANEOUS CORONARY STENT INTERVENTION (PCI-S) N/A 05/05/2014   Procedure: PERCUTANEOUS CORONARY STENT INTERVENTION (PCI-S);  Surgeon: Peter M Martinique, MD;  Location: [facility] CATH LAB;  Service: Cardiovascular;  Laterality: N/A;  . PORTACATH PLACEMENT Left 04/04/2018   Procedure: INSERTION PORT-A-CATH;  Surgeon: [name], MD;  Location: [facility] ORS;  Service: General;  Laterality: Left;    Family History  Problem Relation Age of Onset  . CAD Mother 53       Died of an MI  . CAD Father 45       Died of a massive MI  . COPD Sister   . Lung cancer Brother   . Bone cancer Paternal Uncle   . Colon cancer Neg Hx   . Breast cancer Neg Hx     Social History   Socioeconomic History  . Marital status: Married    Spouse name: Not on file  . Number of children: Not on file  . Years of education: Not on file  . Highest education level: Not on file  Occupational History  . Not on file  Social Needs  . Financial resource strain: Not on file  . Food insecurity:    Worry: Not on file    Inability: Not on file  . Transportation needs:    Medical: Not on file    Non-medical: Not on file  Tobacco Use  . Smoking status: Former Smoker    Packs/day: 1.00    Years: 40.00    Pack years: 40.00    Types: Cigarettes    Last attempt to quit: 02/14/2011    Years since quitting: 7.5  . Smokeless tobacco: Never Used  Substance and Sexual Activity  . Alcohol use: No  . Drug use: No  . Sexual activity: Not on file  Lifestyle  . Physical activity:    Days per week: Not on file    Minutes per session: Not on file  . Stress: Not on file  Relationships  . Social  connections:    Talks on phone: Not on file    Gets together: Not on file    Attends religious service: Not on file    Active member of club or organization: Not on file    Attends meetings of clubs or organizations: Not on file    Relationship status: Not on file  . Intimate partner violence:    Fear of current or ex partner: Not on file    Emotionally abused: Not on file    Physically abused: Not on file    Forced sexual activity: Not on file  Other Topics Concern  . Not on file  Social History Narrative   The patient is married. He lives in [place]. He works full-time. He quit smoking in 2013.    Current Outpatient Medications on File Prior to Visit  Medication Sig Dispense Refill  . acetaminophen (TYLENOL) 500 MG tablet Take 1,000 mg by mouth every 6 (six) hours as needed (for pain.).    Marland Kitchen albuterol (PROAIR HFA) 108 (90 Base) MCG/ACT inhaler Inhale 2 puffs into the lungs  every 6 (six) hours as needed for wheezing or shortness of breath.     Marland Kitchen aspirin EC 81 MG tablet Take 81 mg by mouth daily.    Marland Kitchen atorvastatin (LIPITOR) 80 MG tablet Take 1 tablet (80 mg total) by mouth daily at 6 PM. 90 tablet 3  . carvedilol (COREG) 6.25 MG tablet Take 1 tablet (6.25 mg total) by mouth 2 (two) times daily. 180 tablet 3  . colestipol (COLESTID) 1 g tablet Take 2 g by mouth as needed (for stomach issues).     . folic acid (FOLVITE) 1 MG tablet Take 1 tablet (1 mg total) by mouth daily. 90 tablet 1  . lisinopril (ZESTRIL) 10 MG tablet Take 1 tablet (10 mg total) by mouth daily. 90 tablet 3  . nitroGLYCERIN (NITROSTAT) 0.4 MG SL tablet Place 1 tablet (0.4 mg total) under the tongue every 5 (five) minutes as needed for chest pain. X 3 doses 25 tablet 3  . omeprazole (PRILOSEC) 40 MG capsule Take 1 capsule (40 mg total) by mouth 2 (two) times daily. 60 capsule 1  . potassium chloride SA (KLOR-CON M20) 20 MEQ tablet Take 1 tablet (20 mEq total) by mouth daily. 90 tablet 3  . sucralfate (CARAFATE) 1 g  tablet Take 1 tablet (1 g total) by mouth 3 (three) times daily before meals. Dissolve in warm water, swish and swallow 90 tablet 6  . tamsulosin (FLOMAX) 0.4 MG CAPS capsule Take 0.4 mg by mouth daily after supper.    . traZODone (DESYREL) 50 MG tablet Take 1 tablet (50 mg total) by mouth at bedtime. 90 tablet 0  . umeclidinium-vilanterol (ANORO ELLIPTA) 62.5-25 MCG/INH AEPB Inhale 1 puff into the lungs every morning.    . vitamin B-12 (CYANOCOBALAMIN) 1000 MCG tablet Take 1 tablet (1,000 mcg total) by mouth daily. 90 tablet 1  . [DISCONTINUED] prochlorperazine (COMPAZINE) 10 MG tablet Take 1 tablet (10 mg total) by mouth every 6 (six) hours as needed (Nausea or vomiting). 30 tablet 1   No current facility-administered medications on file prior to visit.     No Known Allergies     Observations/Objective: There were no vitals filed for this visit. There is no height or weight on file to calculate BMI.  Pain level 0 Physical Exam  Constitutional: He is well-developed, well-nourished, and in no distress. No distress.  HENT:  Head: Normocephalic and atraumatic.  Eyes: EOM are normal.  Neurological: He is alert.  Skin: No rash noted.  Psychiatric: Affect normal.   CBC    Component Value Date/Time   WBC 6.2 08/22/2018 0939   RBC 3.71 (L) 08/22/2018 0939   HGB 12.4 (L) 08/22/2018 0939   HGB 15.1 05/03/2014 1457   HCT 36.2 (L) 08/22/2018 0939   HCT 44.8 05/03/2014 1457   PLT 201 08/22/2018 0939   PLT 234 05/03/2014 1457   MCV 97.6 08/22/2018 0939   MCV 96 05/03/2014 1457   MCH 33.4 08/22/2018 0939   MCHC 34.3 08/22/2018 0939   RDW 11.9 08/22/2018 0939   RDW 13.3 05/03/2014 1457   LYMPHSABS 0.6 (L) 08/22/2018 0939   MONOABS 0.9 08/22/2018 0939   EOSABS 0.3 08/22/2018 0939   BASOSABS 0.1 08/22/2018 0939    CMP     Component Value Date/Time   NA 137 08/22/2018 0939   NA 140 08/12/2015 0827   NA 142 05/03/2014 1457   K 3.7 08/22/2018 0939   K 3.3 (L) 05/03/2014 1457   CL  104 08/22/2018  0939   CL 107 05/03/2014 1457   CO2 25 08/22/2018 0939   CO2 26 05/03/2014 1457   GLUCOSE 106 (H) 08/22/2018 0939   GLUCOSE 89 05/03/2014 1457   BUN 10 08/22/2018 0939   BUN 10 08/12/2015 0827   BUN 10 05/03/2014 1457   CREATININE 0.93 08/22/2018 0939   CREATININE 1.11 05/03/2014 1457   CALCIUM 8.7 (L) 08/22/2018 0939   CALCIUM 8.3 (L) 05/03/2014 1457   PROT 6.6 08/22/2018 0939   PROT 6.5 08/12/2015 0827   ALBUMIN 3.5 08/22/2018 0939   ALBUMIN 4.0 08/12/2015 0827   AST 21 08/22/2018 0939   ALT 15 08/22/2018 0939   ALKPHOS 187 (H) 08/22/2018 0939   BILITOT 0.7 08/22/2018 0939   BILITOT 0.4 08/12/2015 0827   GFRNONAA >60 08/22/2018 0939   GFRNONAA >60 05/03/2014 1457   GFRAA >60 08/22/2018 0939   GFRAA >60 05/03/2014 1457     Assessment and Plan: 1. Malignant neoplasm of lower lobe of right lung ([place])   2. Encounter for antineoplastic immunotherapy   3. Folate deficiency   4. Elevated alkaline phosphatase level   5. Anemia due to antineoplastic chemotherapy   6. Other fatigue    # Stage IIIA lung cancer,  Tolerate Durvaluamb well.  He will proceed with labs on 5/18 and if lab results meeting criteria and vital signs are normal, he will proceed with Durvalumab treatment on 5/18.   Last surveillance CT was done on 07/05/2018.  CT scan due around 1/49/7026 Folic deficiency, continue folic acid 55m daily. Elevated ALP, continue to monitor.  Anemia, monitor.  Fatigue, check TSH  Follow Up Instructions: ~2 weeks.  For assessment prior to next immunotherapy treatment.  I discussed the assessment and treatment plan with the patient. The patient was provided an opportunity to ask questions and all were answered. The patient agreed with the plan and demonstrated an understanding of the instructions.  The patient was advised to call back or seek an in-person evaluation if the symptoms worsen or if the condition fails to improve as anticipated.  ZEarlie Server MD  09/06/2018 9:36 PM

## 2018-09-06 NOTE — Progress Notes (Signed)
Patient contacted for telehealth visit. No new concerns voiced.

## 2018-09-09 ENCOUNTER — Inpatient Hospital Stay: Payer: BLUE CROSS/BLUE SHIELD

## 2018-09-09 ENCOUNTER — Ambulatory Visit: Payer: BLUE CROSS/BLUE SHIELD | Admitting: Oncology

## 2018-09-09 ENCOUNTER — Other Ambulatory Visit: Payer: Self-pay

## 2018-09-09 VITALS — BP 136/85 | HR 82 | Temp 97.7°F | Resp 17 | Wt 150.8 lb

## 2018-09-09 DIAGNOSIS — N400 Enlarged prostate without lower urinary tract symptoms: Secondary | ICD-10-CM | POA: Diagnosis not present

## 2018-09-09 DIAGNOSIS — E785 Hyperlipidemia, unspecified: Secondary | ICD-10-CM | POA: Diagnosis not present

## 2018-09-09 DIAGNOSIS — Z7951 Long term (current) use of inhaled steroids: Secondary | ICD-10-CM | POA: Diagnosis not present

## 2018-09-09 DIAGNOSIS — T451X5D Adverse effect of antineoplastic and immunosuppressive drugs, subsequent encounter: Secondary | ICD-10-CM | POA: Diagnosis not present

## 2018-09-09 DIAGNOSIS — Z79899 Other long term (current) drug therapy: Secondary | ICD-10-CM | POA: Diagnosis not present

## 2018-09-09 DIAGNOSIS — D6481 Anemia due to antineoplastic chemotherapy: Secondary | ICD-10-CM | POA: Diagnosis not present

## 2018-09-09 DIAGNOSIS — J449 Chronic obstructive pulmonary disease, unspecified: Secondary | ICD-10-CM | POA: Diagnosis not present

## 2018-09-09 DIAGNOSIS — Z5112 Encounter for antineoplastic immunotherapy: Secondary | ICD-10-CM | POA: Diagnosis present

## 2018-09-09 DIAGNOSIS — Z7982 Long term (current) use of aspirin: Secondary | ICD-10-CM | POA: Diagnosis not present

## 2018-09-09 DIAGNOSIS — I10 Essential (primary) hypertension: Secondary | ICD-10-CM | POA: Diagnosis not present

## 2018-09-09 DIAGNOSIS — C3431 Malignant neoplasm of lower lobe, right bronchus or lung: Secondary | ICD-10-CM | POA: Diagnosis present

## 2018-09-09 DIAGNOSIS — Z87891 Personal history of nicotine dependence: Secondary | ICD-10-CM | POA: Diagnosis not present

## 2018-09-09 DIAGNOSIS — E538 Deficiency of other specified B group vitamins: Secondary | ICD-10-CM | POA: Diagnosis not present

## 2018-09-09 LAB — COMPREHENSIVE METABOLIC PANEL
ALT: 17 U/L (ref 0–44)
AST: 23 U/L (ref 15–41)
Albumin: 3.6 g/dL (ref 3.5–5.0)
Alkaline Phosphatase: 198 U/L — ABNORMAL HIGH (ref 38–126)
Anion gap: 6 (ref 5–15)
BUN: 8 mg/dL (ref 8–23)
CO2: 27 mmol/L (ref 22–32)
Calcium: 9 mg/dL (ref 8.9–10.3)
Chloride: 106 mmol/L (ref 98–111)
Creatinine, Ser: 0.96 mg/dL (ref 0.61–1.24)
GFR calc Af Amer: >60 mL/min (ref >60)
GFR calc non Af Amer: >60 mL/min (ref >60)
Glucose, Bld: 85 mg/dL (ref 70–99)
Potassium: 3.9 mmol/L (ref 3.5–5.1)
Sodium: 139 mmol/L (ref 135–145)
Total Bilirubin: 0.6 mg/dL (ref 0.3–1.2)
Total Protein: 7.1 g/dL (ref 6.5–8.1)

## 2018-09-09 LAB — CBC WITH DIFFERENTIAL/PLATELET
Abs Immature Granulocytes: 0.04 10*3/uL (ref 0.00–0.07)
Basophils Absolute: 0 10*3/uL (ref 0.0–0.1)
Basophils Relative: 1 %
Eosinophils Absolute: 0.1 10*3/uL (ref 0.0–0.5)
Eosinophils Relative: 2 %
HCT: 40 % (ref 39.0–52.0)
Hemoglobin: 13.4 g/dL (ref 13.0–17.0)
Immature Granulocytes: 1 %
Lymphocytes Relative: 13 %
Lymphs Abs: 0.8 10*3/uL (ref 0.7–4.0)
MCH: 31.7 pg (ref 26.0–34.0)
MCHC: 33.5 g/dL (ref 30.0–36.0)
MCV: 94.6 fL (ref 80.0–100.0)
Monocytes Absolute: 0.8 10*3/uL (ref 0.1–1.0)
Monocytes Relative: 12 %
Neutro Abs: 4.5 10*3/uL (ref 1.7–7.7)
Neutrophils Relative %: 71 %
Platelets: 226 10*3/uL (ref 150–400)
RBC: 4.23 MIL/uL (ref 4.22–5.81)
RDW: 11.9 % (ref 11.5–15.5)
WBC: 6.3 10*3/uL (ref 4.0–10.5)
nRBC: 0 % (ref 0.0–0.2)

## 2018-09-09 LAB — TSH: TSH: 0.768 u[IU]/mL (ref 0.350–4.500)

## 2018-09-09 MED ORDER — SODIUM CHLORIDE 0.9 % IV SOLN
10.0000 mg/kg | Freq: Once | INTRAVENOUS | Status: AC
  Administered 2018-09-09: 10:00:00 700 mg via INTRAVENOUS
  Filled 2018-09-09: qty 10

## 2018-09-09 MED ORDER — SODIUM CHLORIDE 0.9% FLUSH
10.0000 mL | Freq: Once | INTRAVENOUS | Status: AC
  Administered 2018-09-09: 10 mL via INTRAVENOUS
  Filled 2018-09-09: qty 10

## 2018-09-09 MED ORDER — HEPARIN SOD (PORK) LOCK FLUSH 100 UNIT/ML IV SOLN
500.0000 [IU] | Freq: Once | INTRAVENOUS | Status: AC | PRN
  Administered 2018-09-09: 500 [IU]
  Filled 2018-09-09: qty 5

## 2018-09-09 MED ORDER — SODIUM CHLORIDE 0.9 % IV SOLN
Freq: Once | INTRAVENOUS | Status: AC
  Administered 2018-09-09: 09:00:00 via INTRAVENOUS
  Filled 2018-09-09: qty 250

## 2018-09-20 ENCOUNTER — Inpatient Hospital Stay: Payer: BLUE CROSS/BLUE SHIELD

## 2018-09-20 ENCOUNTER — Other Ambulatory Visit: Payer: Self-pay

## 2018-09-20 ENCOUNTER — Encounter: Payer: Self-pay | Admitting: Oncology

## 2018-09-20 ENCOUNTER — Inpatient Hospital Stay (HOSPITAL_BASED_OUTPATIENT_CLINIC_OR_DEPARTMENT_OTHER): Payer: BLUE CROSS/BLUE SHIELD | Admitting: Oncology

## 2018-09-20 VITALS — BP 112/69 | HR 96 | Temp 98.5°F | Resp 18 | Wt 153.7 lb

## 2018-09-20 DIAGNOSIS — Z7951 Long term (current) use of inhaled steroids: Secondary | ICD-10-CM

## 2018-09-20 DIAGNOSIS — Z7982 Long term (current) use of aspirin: Secondary | ICD-10-CM

## 2018-09-20 DIAGNOSIS — E785 Hyperlipidemia, unspecified: Secondary | ICD-10-CM

## 2018-09-20 DIAGNOSIS — D6481 Anemia due to antineoplastic chemotherapy: Secondary | ICD-10-CM | POA: Diagnosis not present

## 2018-09-20 DIAGNOSIS — R21 Rash and other nonspecific skin eruption: Secondary | ICD-10-CM

## 2018-09-20 DIAGNOSIS — I10 Essential (primary) hypertension: Secondary | ICD-10-CM | POA: Diagnosis not present

## 2018-09-20 DIAGNOSIS — Z87891 Personal history of nicotine dependence: Secondary | ICD-10-CM

## 2018-09-20 DIAGNOSIS — J449 Chronic obstructive pulmonary disease, unspecified: Secondary | ICD-10-CM

## 2018-09-20 DIAGNOSIS — R748 Abnormal levels of other serum enzymes: Secondary | ICD-10-CM

## 2018-09-20 DIAGNOSIS — C3431 Malignant neoplasm of lower lobe, right bronchus or lung: Secondary | ICD-10-CM

## 2018-09-20 DIAGNOSIS — Z5112 Encounter for antineoplastic immunotherapy: Secondary | ICD-10-CM

## 2018-09-20 DIAGNOSIS — N400 Enlarged prostate without lower urinary tract symptoms: Secondary | ICD-10-CM

## 2018-09-20 DIAGNOSIS — Z79899 Other long term (current) drug therapy: Secondary | ICD-10-CM

## 2018-09-20 DIAGNOSIS — E538 Deficiency of other specified B group vitamins: Secondary | ICD-10-CM

## 2018-09-20 LAB — COMPREHENSIVE METABOLIC PANEL
ALT: 27 U/L (ref 0–44)
AST: 29 U/L (ref 15–41)
Albumin: 3.6 g/dL (ref 3.5–5.0)
Alkaline Phosphatase: 196 U/L — ABNORMAL HIGH (ref 38–126)
Anion gap: 7 (ref 5–15)
BUN: 10 mg/dL (ref 8–23)
CO2: 26 mmol/L (ref 22–32)
Calcium: 8.8 mg/dL — ABNORMAL LOW (ref 8.9–10.3)
Chloride: 107 mmol/L (ref 98–111)
Creatinine, Ser: 1.21 mg/dL (ref 0.61–1.24)
GFR calc Af Amer: >60 mL/min (ref >60)
GFR calc non Af Amer: >60 mL/min (ref >60)
Glucose, Bld: 105 mg/dL — ABNORMAL HIGH (ref 70–99)
Potassium: 3.8 mmol/L (ref 3.5–5.1)
Sodium: 140 mmol/L (ref 135–145)
Total Bilirubin: 0.5 mg/dL (ref 0.3–1.2)
Total Protein: 6.9 g/dL (ref 6.5–8.1)

## 2018-09-20 LAB — CBC WITH DIFFERENTIAL/PLATELET
Abs Immature Granulocytes: 0.06 10*3/uL (ref 0.00–0.07)
Basophils Absolute: 0 10*3/uL (ref 0.0–0.1)
Basophils Relative: 1 %
Eosinophils Absolute: 0.3 10*3/uL (ref 0.0–0.5)
Eosinophils Relative: 4 %
HCT: 40.8 % (ref 39.0–52.0)
Hemoglobin: 13.4 g/dL (ref 13.0–17.0)
Immature Granulocytes: 1 %
Lymphocytes Relative: 14 %
Lymphs Abs: 0.9 10*3/uL (ref 0.7–4.0)
MCH: 31.7 pg (ref 26.0–34.0)
MCHC: 32.8 g/dL (ref 30.0–36.0)
MCV: 96.5 fL (ref 80.0–100.0)
Monocytes Absolute: 0.6 10*3/uL (ref 0.1–1.0)
Monocytes Relative: 10 %
Neutro Abs: 4.4 10*3/uL (ref 1.7–7.7)
Neutrophils Relative %: 70 %
Platelets: 182 10*3/uL (ref 150–400)
RBC: 4.23 MIL/uL (ref 4.22–5.81)
RDW: 12.2 % (ref 11.5–15.5)
WBC: 6.2 10*3/uL (ref 4.0–10.5)
nRBC: 0 % (ref 0.0–0.2)

## 2018-09-20 LAB — TSH: TSH: 0.636 u[IU]/mL (ref 0.350–4.500)

## 2018-09-20 NOTE — Progress Notes (Signed)
Hematology/Oncology follow up note [facility] [phone] [phone]   Patient Care Team: Baxter Hire, MD as PCP - General (Internal Medicine) Sherren Mocha, MD as PCP - Cardiology (Cardiology) Telford Nab, RN as Registered Nurse  REFERRING PROVIDER: Dr.Fleming REASON FOR VISIT:  Follow-up for management of stage III non-small cell lung cancer   HISTORY OF PRESENTING ILLNESS:  Timothy Yoder is a  63 y.o.  male with PMH listed below who was referred to me for evaluation of lung mass.  Patient is a former smoker quit smoking 7 years ago.  40-pack-year. Patient chest lung cancer screening on 03/11/2018. CT scan showed 2.4 cm right lower lobe nodule with a new necrotic.  Subcarinal lymph node. PET scan showed hypermetabolic right lower pulmonary nodule, consistent with primary bronchogenic carcinoma.  Fluid density subcarinal structure demonstrate no significant hypermetabolism.  Although this could represent an incidental benign lesion such as a cyst given the interval development since 02/24/2017, necrotic adenopathy cannot be excluded and tissue sampling should be considered.  Patient was seen by Dr. Faith Rogue for initial evaluation on 03/13/2018.  Patient has not got PET scan at that time. Today patient was accompanied by his wife to the oncology clinic to discuss management plan.  And PET scan results. Patient reports chronic shortness of breath with moderate exertion.  Appetite is good denies any fever, chills, hemoptysis, weight loss.  # underwent biopsy via bronchoscopy.  Subcarinal lymph node biopsy positive for non-small cell lung cancer, favoring poorly differentiated adenocarcinoma. #Mediport was placed by Dr. Genevive Bi   # Cancer Treatment 04/26/2018 -06/11/2018 Concurrent Chemotherapy Daneil Dolin and Taxol] with RT.  07/10/2018 Started on maintenance Durvalumab.  INTERVAL HISTORY Timothy Yoder is a 63 y.o. male who has above  history reviewed by me today presents for follow up visit for management of stage III non-small cell lung cancer.  Patient has been on maintenance immunotherapy. Patient reports feeling well at baseline.  He has developed some itchy skin bumps on his forearms, and on his face too.  Fatigue: reports worsening fatigue. Chronic onset, perisistent, no aggravating or improving factors, no associated symptoms.  Left posterior rib cage pain has resolved.  Acid reflux symptoms stable. On omeprazole.   Review of Systems  Constitutional: Negative for appetite change, chills, fatigue, fever and unexpected weight change.  HENT:   Negative for hearing loss and voice change.   Eyes: Negative for eye problems and icterus.  Respiratory: Negative for chest tightness, cough and shortness of breath.   Cardiovascular: Negative for chest pain and leg swelling.  Gastrointestinal: Negative for abdominal distention and abdominal pain.  Endocrine: Negative for hot flashes.  Genitourinary: Negative for difficulty urinating, dysuria and frequency.   Musculoskeletal: Negative for arthralgias.  Skin: Positive for rash. Negative for itching.  Neurological: Negative for light-headedness and numbness.  Hematological: Negative for adenopathy. Does not bruise/bleed easily.  Psychiatric/Behavioral: Negative for confusion.   MEDICAL HISTORY:  Past Medical History:  Diagnosis Date  . BPH (benign prostatic hyperplasia)   . CAD (coronary artery disease)    a. 04/2014 anterolateral STEMI/PCI: LM nl, LAD 37m(2.75x16 Promus DES, D1 50p, LCX nl, RI nl, OM1 100 small/chronic, RCA dominant, 20-377m7078m(FFR 0.90->Med Rx), RPDA 50, RPLA 70, EF 60%.  . CMarland KitchenPD (chronic obstructive pulmonary disease) (HCC)    Dr FleRaul Del Former tobacco use   . Gastroesophageal reflux disease   . HTN (hypertension)   . Hyperlipidemia   . Hypokalemia   .  Malignant neoplasm of lung ([place]) 04/01/2018  . Myocardial infarction ([place]) 04/2014   Dr  Sherren Mocha  . Stented coronary artery     SURGICAL HISTORY: Past Surgical History:  Procedure Laterality Date  . CARDIAC CATHETERIZATION    . CHOLECYSTECTOMY N/A 02/15/2016   Procedure: LAPAROSCOPIC CHOLECYSTECTOMY WITH INTRAOPERATIVE CHOLANGIOGRAM;  Surgeon: Leonie Green, MD;  Location: [facility] ORS;  Service: General;  Laterality: N/A;  . COLONOSCOPY    . COLONOSCOPY WITH PROPOFOL N/A 08/02/2015   Procedure: COLONOSCOPY WITH PROPOFOL;  Surgeon: Manya Silvas, MD;  Location: [facility] ENDOSCOPY;  Service: Endoscopy;  Laterality: N/A;  . CORONARY ANGIOPLASTY  04/2014   STENT  . ENDOBRONCHIAL ULTRASOUND N/A 03/26/2018   Procedure: ENDOBRONCHIAL ULTRASOUND;  Surgeon: Flora Lipps, MD;  Location: [facility] ORS;  Service: Cardiopulmonary;  Laterality: N/A;  . ESOPHAGOGASTRODUODENOSCOPY (EGD) WITH PROPOFOL N/A 08/02/2015   Procedure: ESOPHAGOGASTRODUODENOSCOPY (EGD) WITH PROPOFOL;  Surgeon: Manya Silvas, MD;  Location: [facility] ENDOSCOPY;  Service: Endoscopy;  Laterality: N/A;  . FLEXIBLE BRONCHOSCOPY N/A 03/26/2018   Procedure: FLEXIBLE BRONCHOSCOPY;  Surgeon: Flora Lipps, MD;  Location: [facility] ORS;  Service: Cardiopulmonary;  Laterality: N/A;  . KNEE ARTHROSCOPY Left   . LEFT HEART CATHETERIZATION WITH CORONARY ANGIOGRAM N/A 05/03/2014   Procedure: LEFT HEART CATHETERIZATION WITH CORONARY ANGIOGRAM;  Surgeon: Blane Ohara, MD;  Location: [facility] CATH LAB;  Service: Cardiovascular;  Laterality: N/A;  . PERCUTANEOUS CORONARY STENT INTERVENTION (PCI-S) N/A 05/05/2014   Procedure: PERCUTANEOUS CORONARY STENT INTERVENTION (PCI-S);  Surgeon: Peter M Martinique, MD;  Location: [facility] CATH LAB;  Service: Cardiovascular;  Laterality: N/A;  . PORTACATH PLACEMENT Left 04/04/2018   Procedure: INSERTION PORT-A-CATH;  Surgeon: [name], MD;  Location: [facility] ORS;  Service: General;  Laterality: Left;    SOCIAL HISTORY: Social History   Socioeconomic History  . Marital status: Married    Spouse name: Not on file   . Number of children: Not on file  . Years of education: Not on file  . Highest education level: Not on file  Occupational History  . Not on file  Social Needs  . Financial resource strain: Not on file  . Food insecurity:    Worry: Not on file    Inability: Not on file  . Transportation needs:    Medical: Not on file    Non-medical: Not on file  Tobacco Use  . Smoking status: Former Smoker    Packs/day: 1.00    Years: 40.00    Pack years: 40.00    Types: Cigarettes    Last attempt to quit: 02/14/2011    Years since quitting: 7.6  . Smokeless tobacco: Never Used  Substance and Sexual Activity  . Alcohol use: No  . Drug use: No  . Sexual activity: Not on file  Lifestyle  . Physical activity:    Days per week: Not on file    Minutes per session: Not on file  . Stress: Not on file  Relationships  . Social connections:    Talks on phone: Not on file    Gets together: Not on file    Attends religious service: Not on file    Active member of club or organization: Not on file    Attends meetings of clubs or organizations: Not on file    Relationship status: Not on file  . Intimate partner violence:    Fear of current or ex partner: Not on file    Emotionally abused: Not on file    Physically abused:  Not on file    Forced sexual activity: Not on file  Other Topics Concern  . Not on file  Social History Narrative   The patient is married. He lives in [place]. He works full-time. He quit smoking in 2013.    FAMILY HISTORY: Family History  Problem Relation Age of Onset  . CAD Mother 41       Died of an MI  . CAD Father 58       Died of a massive MI  . COPD Sister   . Lung cancer Brother   . Bone cancer Paternal Uncle   . Colon cancer Neg Hx   . Breast cancer Neg Hx     ALLERGIES:  has No Known Allergies.  MEDICATIONS:  Current Outpatient Medications  Medication Sig Dispense Refill  . acetaminophen (TYLENOL) 500 MG tablet Take 1,000 mg by mouth every 6  (six) hours as needed (for pain.).    Marland Kitchen albuterol (PROAIR HFA) 108 (90 Base) MCG/ACT inhaler Inhale 2 puffs into the lungs every 6 (six) hours as needed for wheezing or shortness of breath.     Marland Kitchen aspirin EC 81 MG tablet Take 81 mg by mouth daily.    Marland Kitchen atorvastatin (LIPITOR) 80 MG tablet Take 1 tablet (80 mg total) by mouth daily at 6 PM. 90 tablet 3  . carvedilol (COREG) 6.25 MG tablet Take 1 tablet (6.25 mg total) by mouth 2 (two) times daily. 180 tablet 3  . colestipol (COLESTID) 1 g tablet Take 2 g by mouth as needed (for stomach issues).     . folic acid (FOLVITE) 1 MG tablet Take 1 tablet (1 mg total) by mouth daily. 90 tablet 1  . lisinopril (ZESTRIL) 10 MG tablet Take 1 tablet (10 mg total) by mouth daily. 90 tablet 3  . nitroGLYCERIN (NITROSTAT) 0.4 MG SL tablet Place 1 tablet (0.4 mg total) under the tongue every 5 (five) minutes as needed for chest pain. X 3 doses 25 tablet 3  . omeprazole (PRILOSEC) 40 MG capsule Take 1 capsule (40 mg total) by mouth 2 (two) times daily. 60 capsule 1  . potassium chloride SA (KLOR-CON M20) 20 MEQ tablet Take 1 tablet (20 mEq total) by mouth daily. 90 tablet 3  . sucralfate (CARAFATE) 1 g tablet Take 1 tablet (1 g total) by mouth 3 (three) times daily before meals. Dissolve in warm water, swish and swallow 90 tablet 6  . tamsulosin (FLOMAX) 0.4 MG CAPS capsule Take 0.4 mg by mouth daily after supper.    . traZODone (DESYREL) 50 MG tablet Take 1 tablet (50 mg total) by mouth at bedtime. 90 tablet 0  . umeclidinium-vilanterol (ANORO ELLIPTA) 62.5-25 MCG/INH AEPB Inhale 1 puff into the lungs every morning.    . vitamin B-12 (CYANOCOBALAMIN) 1000 MCG tablet Take 1 tablet (1,000 mcg total) by mouth daily. 90 tablet 1   No current facility-administered medications for this visit.      PHYSICAL EXAMINATION: ECOG PERFORMANCE STATUS: 0 - Asymptomatic Vitals:   09/20/18 1417  BP: 112/69  Pulse: 96  Resp: 18  Temp: 98.5 F (36.9 C)   Filed Weights    09/20/18 1417  Weight: 153 lb 11.2 oz (69.7 kg)    Physical Exam Constitutional:      General: He is not in acute distress. HENT:     Head: Normocephalic and atraumatic.  Eyes:     General: No scleral icterus.    Pupils: Pupils are equal, round, and reactive  to light.  Neck:     Musculoskeletal: Normal range of motion and neck supple.  Cardiovascular:     Rate and Rhythm: Normal rate and regular rhythm.     Heart sounds: Normal heart sounds.  Pulmonary:     Effort: Pulmonary effort is normal. No respiratory distress.     Breath sounds: No wheezing.  Abdominal:     General: Bowel sounds are normal. There is no distension.     Palpations: Abdomen is soft. There is no mass.     Tenderness: There is no abdominal tenderness.  Musculoskeletal: Normal range of motion.        General: No deformity.  Skin:    General: Skin is warm and dry.     Findings: No erythema or rash.  Neurological:     Mental Status: He is alert and oriented to person, place, and time.     Cranial Nerves: No cranial nerve deficit.     Coordination: Coordination normal.  Psychiatric:        Behavior: Behavior normal.        Thought Content: Thought content normal.      LABORATORY DATA:  I have reviewed the data as listed Lab Results  Component Value Date   WBC 6.2 09/20/2018   HGB 13.4 09/20/2018   HCT 40.8 09/20/2018   MCV 96.5 09/20/2018   PLT 182 09/20/2018   Recent Labs    08/22/18 0939 09/09/18 0844 09/20/18 1348  NA 137 139 140  K 3.7 3.9 3.8  CL 104 106 107  CO2 _0 GLUCOSE 106* 85 105*  BUN _1 CREATININE 0.93 0.96 1.21  CALCIUM 8.7* 9.0 8.8*  GFRNONAA >60 >60 >60  GFRAA >60 >60 >60  PROT 6.6 7.1 6.9  ALBUMIN 3.5 3.6 3.6  AST _2 ALT _3 ALKPHOS 187* 198* 196*  BILITOT 0.7 0.6 0.5   Iron/TIBC/Ferritin/ %Sat No results found for: IRON, TIBC, FERRITIN, IRONPCTSAT  RADIOGRAPHIC STUDIES: I have personally reviewed the radiological images as listed and  agreed with the findings in the report.. 03/11/2018 CT chest lung screen 1. New 2.4 cm right lower lobe nodule with a new necrotic appearing subcarinal lymph node. Lung-RADS 4B, suspicious. Additional imaging evaluation or consultation with Pulmonology or Thoracic Surgery recommended. These results will be called to the ordering clinician or representative by the Radiologist Assistant, and communication documented in the PACS or zVision Dashboard. 2. Aortic atherosclerosis (ICD10-170.0). Coronary arterycalcification.3.  Emphysema (ICD10-J43.9). 03/13/2018 PET scan # Hypermetabolic right lower lobe pulmonary nodule, consistent with primary bronchogenic carcinoma. Fluid density subcarinal structure demonstrates no significant hypermetabolism. Although this could represent an incidental benign lesion such as a bronchogenic cyst, given interval development since 02/24/2017, necrotic adenopathy cannot be excluded and tissue sampling should be considered.  03/26/2018 DIAGNOSIS:  A. SUBCARINA, CYSTIC MASS; FNA:  - NON-SMALL CELL CARCINOMA CONSISTENT WITH POORLY DIFFERENTIATED  ADENOCARCINOMA.   MRI brain 04/01/2018  Normal MRI head with and without contrast for age.  No intracranial metastasis.  07/05/2018 CT chest w contrast #Significant partial treatment response.  Medial right lower lobe pulmonary nodule is significantly decreased.  Subcarinal adenopathy has resolved. No new or progressive metastatic disease in the chest.  Aortic sclerosis and emphysema.   ASSESSMENT & PLAN:  1. Malignant neoplasm of lower lobe of right lung (HCC)   2. Elevated alkaline phosphatase level   3. Encounter for antineoplastic immunotherapy   4. Skin rash  Cancer Staging Malignant neoplasm of lower lobe of right lung [facility]) Staging form: Lung, AJCC 8th Edition - Clinical stage from 05/13/2018: Stage IIIA (cT1c, cN2, cM0) - Signed by Earlie Server, MD on 05/13/2018  #Stage IIIA lung adenocarcinoma Labs are reviewed and  discussed with patient.  Counts acceptable to proceed with durvalumab treatment on 09/23/2018.  #Skin rash likely secondary to immunotherapy skin toxicities.  Advised patient to use Benadryl cream as needed for itching. If symptoms are not controlled he may take over-the-counter Benadryl orally.  Follow manufacture instruction.  #Elevated alkaline phosphatase GGT is normal.  Recommend to obtain ultrasound right upper quadrant limited for further evaluation. Bone scan is negative. Refer to gastroenterology Dr. Vicente Males for further evaluation .Return of visit: 2 weeks for lab and MD assessment prior to next immunotherapy treatment. Orders Placed This Encounter  Procedures  . US Abdomen Limited RUQ    Standing Status:   Future    Standing Expiration Date:   11/20/2019    Order Specific Question:   Reason for Exam (SYMPTOM  OR DIAGNOSIS REQUIRED)    Answer:   elevated ALP    Order Specific Question:   Preferred imaging location?    Answer:   [facility]  . Ambulatory referral to Gastroenterology    Referral Priority:   Routine    Referral Type:   Consultation    Referral Reason:   Specialty Services Required    Number of Visits Requested:   1   Earlie Server, MD, PhD Hematology Oncology [facility] Pager- [phone] 09/20/18

## 2018-09-20 NOTE — Progress Notes (Signed)
Patient here for follow up. No concerns voiced.  °

## 2018-09-23 ENCOUNTER — Inpatient Hospital Stay: Payer: BC Managed Care – PPO | Attending: Oncology

## 2018-09-23 ENCOUNTER — Other Ambulatory Visit: Payer: Self-pay

## 2018-09-23 VITALS — BP 113/69 | HR 85 | Temp 98.0°F | Resp 18

## 2018-09-23 DIAGNOSIS — R21 Rash and other nonspecific skin eruption: Secondary | ICD-10-CM | POA: Insufficient documentation

## 2018-09-23 DIAGNOSIS — Z5112 Encounter for antineoplastic immunotherapy: Secondary | ICD-10-CM | POA: Diagnosis present

## 2018-09-23 DIAGNOSIS — R748 Abnormal levels of other serum enzymes: Secondary | ICD-10-CM | POA: Insufficient documentation

## 2018-09-23 DIAGNOSIS — Z452 Encounter for adjustment and management of vascular access device: Secondary | ICD-10-CM | POA: Insufficient documentation

## 2018-09-23 DIAGNOSIS — R740 Nonspecific elevation of levels of transaminase and lactic acid dehydrogenase [LDH]: Secondary | ICD-10-CM | POA: Diagnosis not present

## 2018-09-23 DIAGNOSIS — C3431 Malignant neoplasm of lower lobe, right bronchus or lung: Secondary | ICD-10-CM | POA: Insufficient documentation

## 2018-09-23 MED ORDER — SODIUM CHLORIDE 0.9 % IV SOLN
10.0000 mg/kg | Freq: Once | INTRAVENOUS | Status: AC
  Administered 2018-09-23: 700 mg via INTRAVENOUS
  Filled 2018-09-23: qty 10

## 2018-09-23 MED ORDER — SODIUM CHLORIDE 0.9 % IV SOLN
Freq: Once | INTRAVENOUS | Status: AC
  Administered 2018-09-23: 14:00:00 via INTRAVENOUS
  Filled 2018-09-23: qty 250

## 2018-09-23 MED ORDER — SODIUM CHLORIDE 0.9% FLUSH
10.0000 mL | INTRAVENOUS | Status: DC | PRN
  Administered 2018-09-23: 10 mL via INTRAVENOUS
  Filled 2018-09-23: qty 10

## 2018-09-23 MED ORDER — HEPARIN SOD (PORK) LOCK FLUSH 100 UNIT/ML IV SOLN
500.0000 [IU] | Freq: Once | INTRAVENOUS | Status: AC
  Administered 2018-09-23: 500 [IU] via INTRAVENOUS
  Filled 2018-09-23: qty 5

## 2018-09-23 NOTE — Addendum Note (Signed)
Addended by: Earlie Server on: 09/23/2018 01:49 PM   Modules accepted: Orders

## 2018-09-25 ENCOUNTER — Ambulatory Visit
Admission: RE | Admit: 2018-09-25 | Discharge: 2018-09-25 | Disposition: A | Discharge status: Home / Self Care | Payer: BLUE CROSS/BLUE SHIELD | Source: Ambulatory Visit | Attending: Oncology | Admitting: Oncology

## 2018-09-25 ENCOUNTER — Other Ambulatory Visit: Payer: Self-pay

## 2018-09-25 DIAGNOSIS — R748 Abnormal levels of other serum enzymes: Secondary | ICD-10-CM | POA: Diagnosis not present

## 2018-10-01 ENCOUNTER — Encounter: Payer: Self-pay | Admitting: Oncology

## 2018-10-02 ENCOUNTER — Other Ambulatory Visit: Payer: Self-pay | Admitting: Oncology

## 2018-10-07 ENCOUNTER — Other Ambulatory Visit: Payer: Self-pay

## 2018-10-07 ENCOUNTER — Encounter: Payer: Self-pay | Admitting: Oncology

## 2018-10-07 ENCOUNTER — Inpatient Hospital Stay (HOSPITAL_BASED_OUTPATIENT_CLINIC_OR_DEPARTMENT_OTHER): Payer: BC Managed Care – PPO | Admitting: Oncology

## 2018-10-07 ENCOUNTER — Inpatient Hospital Stay: Payer: BC Managed Care – PPO

## 2018-10-07 VITALS — BP 123/77 | HR 80 | Temp 97.0°F | Wt 148.5 lb

## 2018-10-07 DIAGNOSIS — C3431 Malignant neoplasm of lower lobe, right bronchus or lung: Secondary | ICD-10-CM

## 2018-10-07 DIAGNOSIS — R748 Abnormal levels of other serum enzymes: Secondary | ICD-10-CM | POA: Diagnosis not present

## 2018-10-07 DIAGNOSIS — R21 Rash and other nonspecific skin eruption: Secondary | ICD-10-CM

## 2018-10-07 DIAGNOSIS — R7401 Elevation of levels of liver transaminase levels: Secondary | ICD-10-CM

## 2018-10-07 DIAGNOSIS — R740 Nonspecific elevation of levels of transaminase and lactic acid dehydrogenase [LDH]: Secondary | ICD-10-CM | POA: Diagnosis not present

## 2018-10-07 DIAGNOSIS — Z5112 Encounter for antineoplastic immunotherapy: Secondary | ICD-10-CM | POA: Diagnosis not present

## 2018-10-07 LAB — CBC WITH DIFFERENTIAL/PLATELET
Abs Immature Granulocytes: 0.02 10*3/uL (ref 0.00–0.07)
Basophils Absolute: 0 10*3/uL (ref 0.0–0.1)
Basophils Relative: 1 %
Eosinophils Absolute: 0.2 10*3/uL (ref 0.0–0.5)
Eosinophils Relative: 3 %
HCT: 40 % (ref 39.0–52.0)
Hemoglobin: 13.3 g/dL (ref 13.0–17.0)
Immature Granulocytes: 0 %
Lymphocytes Relative: 12 %
Lymphs Abs: 0.7 10*3/uL (ref 0.7–4.0)
MCH: 30.9 pg (ref 26.0–34.0)
MCHC: 33.3 g/dL (ref 30.0–36.0)
MCV: 92.8 fL (ref 80.0–100.0)
Monocytes Absolute: 0.7 10*3/uL (ref 0.1–1.0)
Monocytes Relative: 13 %
Neutro Abs: 4.1 10*3/uL (ref 1.7–7.7)
Neutrophils Relative %: 71 %
Platelets: 206 10*3/uL (ref 150–400)
RBC: 4.31 MIL/uL (ref 4.22–5.81)
RDW: 12.3 % (ref 11.5–15.5)
WBC: 5.7 10*3/uL (ref 4.0–10.5)
nRBC: 0 % (ref 0.0–0.2)

## 2018-10-07 LAB — COMPREHENSIVE METABOLIC PANEL
ALT: 49 U/L — ABNORMAL HIGH (ref 0–44)
AST: 51 U/L — ABNORMAL HIGH (ref 15–41)
Albumin: 3.6 g/dL (ref 3.5–5.0)
Alkaline Phosphatase: 244 U/L — ABNORMAL HIGH (ref 38–126)
Anion gap: 6 (ref 5–15)
BUN: 11 mg/dL (ref 8–23)
CO2: 25 mmol/L (ref 22–32)
Calcium: 8.8 mg/dL — ABNORMAL LOW (ref 8.9–10.3)
Chloride: 106 mmol/L (ref 98–111)
Creatinine, Ser: 1.01 mg/dL (ref 0.61–1.24)
GFR calc Af Amer: >60 mL/min (ref >60)
GFR calc non Af Amer: >60 mL/min (ref >60)
Glucose, Bld: 101 mg/dL — ABNORMAL HIGH (ref 70–99)
Potassium: 3.9 mmol/L (ref 3.5–5.1)
Sodium: 137 mmol/L (ref 135–145)
Total Bilirubin: 0.5 mg/dL (ref 0.3–1.2)
Total Protein: 7.1 g/dL (ref 6.5–8.1)

## 2018-10-07 MED ORDER — SODIUM CHLORIDE 0.9% FLUSH
10.0000 mL | Freq: Once | INTRAVENOUS | Status: AC
  Administered 2018-10-07: 10 mL via INTRAVENOUS
  Filled 2018-10-07: qty 10

## 2018-10-07 MED ORDER — HEPARIN SOD (PORK) LOCK FLUSH 100 UNIT/ML IV SOLN
500.0000 [IU] | Freq: Once | INTRAVENOUS | Status: AC
  Administered 2018-10-07: 500 [IU] via INTRAVENOUS
  Filled 2018-10-07: qty 5

## 2018-10-07 NOTE — Progress Notes (Signed)
Hematology/Oncology follow up note [facility] [phone] [phone]   Patient Care Team: Baxter Hire, MD as PCP - General (Internal Medicine) Sherren Mocha, MD as PCP - Cardiology (Cardiology) Telford Nab, RN as Registered Nurse  REFERRING PROVIDER: Dr.Fleming REASON FOR VISIT:  Follow-up for management of stage III non-small cell lung cancer   HISTORY OF PRESENTING ILLNESS:  Timothy Yoder is a  63 y.o.  male with PMH listed below who was referred to me for evaluation of lung mass.  Patient is a former smoker quit smoking 7 years ago.  40-pack-year. Patient chest lung cancer screening on 03/11/2018. CT scan showed 2.4 cm right lower lobe nodule with a new necrotic.  Subcarinal lymph node. PET scan showed hypermetabolic right lower pulmonary nodule, consistent with primary bronchogenic carcinoma.  Fluid density subcarinal structure demonstrate no significant hypermetabolism.  Although this could represent an incidental benign lesion such as a cyst given the interval development since 02/24/2017, necrotic adenopathy cannot be excluded and tissue sampling should be considered.  Patient was seen by Dr. Faith Rogue for initial evaluation on 03/13/2018.  Patient has not got PET scan at that time. Today patient was accompanied by his wife to the oncology clinic to discuss management plan.  And PET scan results. Patient reports chronic shortness of breath with moderate exertion.  Appetite is good denies any fever, chills, hemoptysis, weight loss.  # underwent biopsy via bronchoscopy.  Subcarinal lymph node biopsy positive for non-small cell lung cancer, favoring poorly differentiated adenocarcinoma. #Mediport was placed by Dr. Genevive Bi   # Cancer Treatment 04/26/2018 -06/11/2018 Concurrent Chemotherapy Daneil Dolin and Taxol] with RT.  07/10/2018 Started on maintenance Durvalumab.  INTERVAL HISTORY Timothy Yoder is a 63 y.o. male who has above  history reviewed by me today presents for follow up visit for management of stage III non-small cell lung cancer.  Patient has been on maintenance immunotherapy. Patient reports feeling well at baseline today. Fatigue, slightly worse.  Chronic onset.  Persistent.  No aggravating or improving factors. No other associated symptoms. Acid reflux symptoms are stable.  He takes omeprazole. During the interval he has had ultrasound liver done.  Review of Systems  Constitutional: Negative for appetite change, chills, fatigue, fever and unexpected weight change.  HENT:   Negative for hearing loss and voice change.   Eyes: Negative for eye problems and icterus.  Respiratory: Negative for chest tightness, cough and shortness of breath.   Cardiovascular: Negative for chest pain and leg swelling.  Gastrointestinal: Negative for abdominal distention and abdominal pain.  Endocrine: Negative for hot flashes.  Genitourinary: Negative for difficulty urinating, dysuria and frequency.   Musculoskeletal: Negative for arthralgias.  Skin: Negative for itching and rash.  Neurological: Negative for light-headedness and numbness.  Hematological: Negative for adenopathy. Does not bruise/bleed easily.  Psychiatric/Behavioral: Negative for confusion.   MEDICAL HISTORY:  Past Medical History:  Diagnosis Date  . BPH (benign prostatic hyperplasia)   . CAD (coronary artery disease)    a. 04/2014 anterolateral STEMI/PCI: LM nl, LAD 69m(2.75x16 Promus DES, D1 50p, LCX nl, RI nl, OM1 100 small/chronic, RCA dominant, [id number](FFR 0.90->Med Rx), RPDA 50, RPLA 70, EF 60%.  . CMarland KitchenPD (chronic obstructive pulmonary disease) (HCC)    Dr FleRaul Del Former tobacco use   . Gastroesophageal reflux disease   . HTN (hypertension)   . Hyperlipidemia   . Hypokalemia   . [place]  . [place]  [place]   Dr Sherren Mocha  . Stented coronary artery     SURGICAL HISTORY:  Past Surgical History:  Procedure Laterality Date  . CARDIAC CATHETERIZATION    . CHOLECYSTECTOMY N/A 02/15/2016   Procedure: LAPAROSCOPIC CHOLECYSTECTOMY WITH INTRAOPERATIVE CHOLANGIOGRAM;  Surgeon: Leonie Green, MD;  Location: [facility] ORS;  Service: General;  Laterality: N/A;  . COLONOSCOPY    . COLONOSCOPY WITH PROPOFOL N/A 08/02/2015   Procedure: COLONOSCOPY WITH PROPOFOL;  Surgeon: Manya Silvas, MD;  Location: [facility] ENDOSCOPY;  Service: Endoscopy;  Laterality: N/A;  . CORONARY ANGIOPLASTY  04/2014   STENT  . ENDOBRONCHIAL ULTRASOUND N/A 03/26/2018   Procedure: ENDOBRONCHIAL ULTRASOUND;  Surgeon: Flora Lipps, MD;  Location: [facility] ORS;  Service: Cardiopulmonary;  Laterality: N/A;  . ESOPHAGOGASTRODUODENOSCOPY (EGD) WITH PROPOFOL N/A 08/02/2015   Procedure: ESOPHAGOGASTRODUODENOSCOPY (EGD) WITH PROPOFOL;  Surgeon: Manya Silvas, MD;  Location[facility] ENDOSCOPY;  Service: Endoscopy;  Laterality: N/A;  . FLEXIBLE BRONCHOSCOPY N/A 03/26/2018   Procedure: FLEXIBLE BRONCHOSCOPY;  Surgeon: Flora Lipps, MD;  Location: [facility] ORS;  Service: Cardiopulmonary;  Laterality: N/A;  . KNEE ARTHROSCOPY Left   . LEFT HEART CATHETERIZATION WITH CORONARY ANGIOGRAM N/A 05/03/2014   Procedure: LEFT HEART CATHETERIZATION WITH CORONARY ANGIOGRAM;  Surgeon: Blane Ohara, MD;  Location: [facility] CATH LAB;  Service: Cardiovascular;  Laterality: N/A;  . PERCUTANEOUS CORONARY STENT INTERVENTION (PCI-S) N/A 05/05/2014   Procedure: PERCUTANEOUS CORONARY STENT INTERVENTION (PCI-S);  Surgeon: Peter M Martinique, MD;  Location: [facility] CATH LAB;  Service: Cardiovascular;  Laterality: N/A;  . PORTACATH PLACEMENT Left 04/04/2018   Procedure: INSERTION PORT-A-CATH;  Surgeon: [name], MD;  Location: [facility] ORS;  Service: General;  Laterality: Left;    SOCIAL HISTORY: Social History   Socioeconomic History  . Marital status: Married    Spouse name: Not on file  . Number of children: Not on file  . Years of education: Not on  file  . Highest education level: Not on file  Occupational History  . Not on file  Social Needs  . Financial resource strain: Not on file  . Food insecurity    Worry: Not on file    Inability: Not on file  . Transportation needs    Medical: Not on file    Non-medical: Not on file  Tobacco Use  . Smoking status: Former Smoker    Packs/day: 1.00    Years: 40.00    Pack years: 40.00    Types: Cigarettes    Quit date: 02/14/2011    Years since quitting: 7.6  . Smokeless tobacco: Never Used  Substance and Sexual Activity  . Alcohol use: No  . Drug use: No  . Sexual activity: Not on file  Lifestyle  . Physical activity    Days per week: Not on file    Minutes per session: Not on file  . Stress: Not on file  Relationships  . Social Herbalist on phone: Not on file    Gets together: Not on file    Attends religious service: Not on file    Active member of club or organization: Not on file    Attends meetings of clubs or organizations: Not on file    Relationship status: Not on file  . Intimate partner violence    Fear of current or ex partner: Not on file    Emotionally abused: Not on file    Physically abused: Not on file    Forced sexual activity: Not on file  Other Topics Concern  . Not on file  Social History Narrative   The patient is married. He lives in [place]. He works full-time. He quit smoking in 2013.    FAMILY HISTORY: Family History  Problem Relation Age of Onset  . CAD Mother 73       Died of an MI  . CAD Father 40       Died of a massive MI  . COPD Sister   . Lung cancer Brother   . Bone cancer Paternal Uncle   . Colon cancer Neg Hx   . Breast cancer Neg Hx     ALLERGIES:  has No Known Allergies.  MEDICATIONS:  Current Outpatient Medications  Medication Sig Dispense Refill  . acetaminophen (TYLENOL) 500 MG tablet Take 1,000 mg by mouth every 6 (six) hours as needed (for pain.).    Marland Kitchen albuterol (PROAIR HFA) 108 (90 Base) MCG/ACT  inhaler Inhale 2 puffs into the lungs every 6 (six) hours as needed for wheezing or shortness of breath.     Marland Kitchen aspirin EC 81 MG tablet Take 81 mg by mouth daily.    Marland Kitchen atorvastatin (LIPITOR) 80 MG tablet Take 1 tablet (80 mg total) by mouth daily at 6 PM. 90 tablet 3  . carvedilol (COREG) 6.25 MG tablet Take 1 tablet (6.25 mg total) by mouth 2 (two) times daily. 180 tablet 3  . colestipol (COLESTID) 1 g tablet Take 2 g by mouth as needed (for stomach issues).     . folic acid (FOLVITE) 1 MG tablet Take 1 tablet (1 mg total) by mouth daily. 90 tablet 1  . lisinopril (ZESTRIL) 10 MG tablet Take 1 tablet (10 mg total) by mouth daily. 90 tablet 3  . nitroGLYCERIN (NITROSTAT) 0.4 MG SL tablet Place 1 tablet (0.4 mg total) under the tongue every 5 (five) minutes as needed for chest pain. X 3 doses 25 tablet 3  . omeprazole (PRILOSEC) 40 MG capsule Take 1 capsule (40 mg total) by mouth 2 (two) times daily. 60 capsule 1  . potassium chloride SA (KLOR-CON M20) 20 MEQ tablet Take 1 tablet (20 mEq total) by mouth daily. 90 tablet 3  . sucralfate (CARAFATE) 1 g tablet Take 1 tablet (1 g total) by mouth 3 (three) times daily before meals. Dissolve in warm water, swish and swallow 90 tablet 6  . tamsulosin (FLOMAX) 0.4 MG CAPS capsule Take 0.4 mg by mouth daily after supper.    . traZODone (DESYREL) 50 MG tablet Take 1 tablet (50 mg total) by mouth at bedtime. 90 tablet 0  . umeclidinium-vilanterol (ANORO ELLIPTA) 62.5-25 MCG/INH AEPB Inhale 1 puff into the lungs every morning.    . vitamin B-12 (CYANOCOBALAMIN) 1000 MCG tablet Take 1 tablet (1,000 mcg total) by mouth daily. 90 tablet 1   No current facility-administered medications for this visit.      PHYSICAL EXAMINATION: ECOG PERFORMANCE STATUS: 0 - Asymptomatic Vitals:   10/07/18 0839  BP: 123/77  Pulse: 80  Temp: (!) 97 F (36.1 C)   Filed Weights   10/07/18 0839  Weight: 148 lb 8 oz (67.4 kg)    Physical Exam Constitutional:      General:  He is not in acute distress. HENT:     Head: Normocephalic and atraumatic.  Eyes:     General: No scleral icterus.    Pupils: Pupils are equal, round, and reactive to light.  Neck:     Musculoskeletal: Normal range of motion and neck  supple.  Cardiovascular:     Rate and Rhythm: Normal rate and regular rhythm.     Heart sounds: Normal heart sounds.  Pulmonary:     Effort: Pulmonary effort is normal. No respiratory distress.     Breath sounds: No wheezing.  Abdominal:     General: Bowel sounds are normal. There is no distension.     Palpations: Abdomen is soft. There is no mass.     Tenderness: There is no abdominal tenderness.  Musculoskeletal: Normal range of motion.        General: No deformity.  Skin:    General: Skin is warm and dry.     Findings: No erythema or rash.  Neurological:     Mental Status: He is alert and oriented to person, place, and time.     Cranial Nerves: No cranial nerve deficit.     Coordination: Coordination normal.  Psychiatric:        Behavior: Behavior normal.        Thought Content: Thought content normal.      LABORATORY DATA:  I have reviewed the data as listed Lab Results  Component Value Date   WBC 5.7 10/07/2018   HGB 13.3 10/07/2018   HCT 40.0 10/07/2018   MCV 92.8 10/07/2018   PLT 206 10/07/2018   Recent Labs    09/09/18 0844 09/20/18 1348 10/07/18 0823  NA 139 140 137  K 3.9 3.8 3.9  CL 106 107 106  CO2 _0 GLUCOSE 85 105* 101*  BUN _1 CREATININE 0.96 1.21 1.01  CALCIUM 9.0 8.8* 8.8*  GFRNONAA >60 >60 >60  GFRAA >60 >60 >60  PROT 7.1 6.9 7.1  ALBUMIN 3.6 3.6 3.6  AST 23 29 51*  ALT 17 27 49*  ALKPHOS 198* 196* 244*  BILITOT 0.6 0.5 0.5   Iron/TIBC/Ferritin/ %Sat No results found for: IRON, TIBC, FERRITIN, IRONPCTSAT  RADIOGRAPHIC STUDIES: I have personally reviewed the radiological images as listed and agreed with the findings in the report.. 03/11/2018 CT chest lung screen 1. New 2.4 cm right  lower lobe nodule with a new necrotic appearing subcarinal lymph node. Lung-RADS 4B, suspicious. Additional imaging evaluation or consultation with Pulmonology or Thoracic Surgery recommended. These results will be called to the ordering clinician or representative by the Radiologist Assistant, and communication documented in the PACS or zVision Dashboard. 2. Aortic atherosclerosis (ICD10-170.0). Coronary arterycalcification.3.  Emphysema (ICD10-J43.9). 03/13/2018 PET scan # Hypermetabolic right lower lobe pulmonary nodule, consistent with primary bronchogenic carcinoma. Fluid density subcarinal structure demonstrates no significant hypermetabolism. Although this could represent an incidental benign lesion such as a bronchogenic cyst, given interval development since 02/24/2017, necrotic adenopathy cannot be excluded and tissue sampling should be considered.  03/26/2018 DIAGNOSIS:  A. SUBCARINA, CYSTIC MASS; FNA:  - NON-SMALL CELL CARCINOMA CONSISTENT WITH POORLY DIFFERENTIATED  ADENOCARCINOMA.   MRI brain 04/01/2018  Normal MRI head with and without contrast for age.  No intracranial metastasis.  07/05/2018 CT chest w contrast #Significant partial treatment response.  Medial right lower lobe pulmonary nodule is significantly decreased.  Subcarinal adenopathy has resolved. No new or progressive metastatic disease in the chest.  Aortic sclerosis and emphysema.   ASSESSMENT & PLAN:  1. Malignant neoplasm of lower lobe of right lung (HCC)   2. Elevated alkaline phosphatase level   3. Transaminitis   Cancer Staging Malignant neoplasm of lower lobe of right lung [facility]) Staging form: Lung, AJCC 8th Edition - Clinical stage from 05/13/2018: Stage  IIIA (cT1c, cN2, cM0) - Signed by Earlie Server, MD on 05/13/2018  #Stage IIIA lung adenocarcinoma Labs are reviewed and discussed with patient.   Hold immunotherapy.  See below. He is due for surveillance CT chest.  Will obtain.  #Skin rash, improved.   Continue Benadryl cream as needed for itching. #Elevated alkaline phosphatase, GGT has been checked which was normal. Ultrasound right upper quadrant was reviewed and discussed with patient. No focal lesion identified in the liver.  Parenchymal echogenicity within normal limits.  Portal vein is patent on color Doppler imaging with normal direction of blood flow.  #Transaminitis.  Unknown etiology. Discussed with patient that I will hold immunotherapy for now.  This may be immunotherapy related. Patient has canceled GI evaluation after ultrasound liver shows normal results.  Given that he has persistently high alkaline phosphatase and now also transaminitis, I will temporarily hold immunotherapy, repeat blood work in 2 weeks.  If transaminitis improves, will continue immunotherapy.  Meanwhile encourage patient to call GI and establish care.  .Return of visit: 2 weeks for lab and MD assessment prior to next immunotherapy treatment. Orders Placed This Encounter  Procedures  . CT Chest Wo Contrast    Standing Status:   Future    Standing Expiration Date:   10/07/2019    Order Specific Question:   Preferred imaging location?    Answer:   [facility]    Order Specific Question:   Radiology Contrast Protocol - do NOT remove file path    Answer:   [email or web address]   Earlie Server, MD, PhD Hematology Oncology [facility] Pager- [phone] 10/07/18

## 2018-10-09 ENCOUNTER — Ambulatory Visit: Payer: BLUE CROSS/BLUE SHIELD

## 2018-10-09 ENCOUNTER — Other Ambulatory Visit: Payer: BLUE CROSS/BLUE SHIELD

## 2018-10-09 ENCOUNTER — Ambulatory Visit: Payer: BLUE CROSS/BLUE SHIELD | Admitting: Oncology

## 2018-10-10 ENCOUNTER — Ambulatory Visit: Payer: BLUE CROSS/BLUE SHIELD | Admitting: Gastroenterology

## 2018-10-14 ENCOUNTER — Encounter: Payer: Self-pay | Admitting: Oncology

## 2018-10-14 DIAGNOSIS — R748 Abnormal levels of other serum enzymes: Secondary | ICD-10-CM

## 2018-10-15 ENCOUNTER — Ambulatory Visit
Admission: RE | Admit: 2018-10-15 | Discharge: 2018-10-15 | Disposition: A | Discharge status: Home / Self Care | Payer: BC Managed Care – PPO | Source: Ambulatory Visit | Attending: Oncology | Admitting: Oncology

## 2018-10-15 ENCOUNTER — Other Ambulatory Visit: Payer: Self-pay

## 2018-10-15 DIAGNOSIS — C3431 Malignant neoplasm of lower lobe, right bronchus or lung: Secondary | ICD-10-CM | POA: Insufficient documentation

## 2018-10-17 ENCOUNTER — Other Ambulatory Visit: Payer: Self-pay | Admitting: Oncology

## 2018-10-18 ENCOUNTER — Other Ambulatory Visit: Payer: Self-pay

## 2018-10-20 ENCOUNTER — Other Ambulatory Visit: Payer: Self-pay | Admitting: Oncology

## 2018-10-21 ENCOUNTER — Other Ambulatory Visit: Payer: Self-pay

## 2018-10-21 ENCOUNTER — Inpatient Hospital Stay (HOSPITAL_BASED_OUTPATIENT_CLINIC_OR_DEPARTMENT_OTHER): Payer: BC Managed Care – PPO | Admitting: Oncology

## 2018-10-21 ENCOUNTER — Inpatient Hospital Stay: Payer: BC Managed Care – PPO

## 2018-10-21 ENCOUNTER — Encounter: Payer: Self-pay | Admitting: Oncology

## 2018-10-21 ENCOUNTER — Inpatient Hospital Stay: Payer: BC Managed Care – PPO | Admitting: Oncology

## 2018-10-21 DIAGNOSIS — C3431 Malignant neoplasm of lower lobe, right bronchus or lung: Secondary | ICD-10-CM | POA: Diagnosis not present

## 2018-10-21 DIAGNOSIS — R748 Abnormal levels of other serum enzymes: Secondary | ICD-10-CM | POA: Diagnosis not present

## 2018-10-21 NOTE — Progress Notes (Signed)
HEMATOLOGY-ONCOLOGY TeleHEALTH VISIT PROGRESS NOTE  I connected with Timothy Yoder on 10/21/18 at 11:30 AM EDT by video enabled telemedicine visit and verified that I am speaking with the correct person using two identifiers. I discussed the limitations, risks, security and privacy concerns of performing an evaluation and management service by telemedicine and the availability of in-person appointments. I also discussed with the patient that there may be a patient responsible charge related to this service. The patient expressed understanding and agreed to proceed.   Other persons participating in the visit and their role in the encounter:  None   Patient's location: Home  Provider's location:work  Chief Complaint: Follow-up for evaluation prior to maintenance durvalumab lung cancer.  INTERVAL HISTORY Timothy Yoder is a 63 y.o. male who has above history reviewed by me today presents for follow up visit for management of immunotherapy for treatment of Stage IIIA lung cancer.   Lung cancer, on immunotherapy.  Patient has tolerated immunotherapy well. Patient has finished 7 cycles of durvalumab every 2 weeks treatments.  No significant signs of immunotherapy related toxicities. Patient has had CT chest surveillance scan done during interval.  He wants to have a discussion about CT scan. No new complaints. Chronic fatigue, persistent at baseline.  Folate acid deficiency, he is taking folic acid supplementation.  denies any skin rash, diarrhea, cough, shortness of breath or abdominal pain.   Review of Systems  Constitutional: Positive for fatigue. Negative for appetite change, chills, fever and unexpected weight change.  HENT:   Negative for hearing loss and voice change.   Eyes: Negative for eye problems and icterus.  Respiratory: Negative for chest tightness, cough and shortness of breath.   Cardiovascular: Negative for chest pain and leg swelling.  Gastrointestinal: Negative for  abdominal distention and abdominal pain.  Endocrine: Negative for hot flashes.  Genitourinary: Negative for difficulty urinating, dysuria and frequency.   Musculoskeletal: Negative for arthralgias.  Skin: Negative for itching and rash.  Neurological: Negative for light-headedness and numbness.  Hematological: Negative for adenopathy. Does not bruise/bleed easily.  Psychiatric/Behavioral: Negative for confusion.    Past Medical History:  Diagnosis Date  . BPH (benign prostatic hyperplasia)   . CAD (coronary artery disease)    a. 04/2014 anterolateral STEMI/PCI: LM nl, LAD 72m(2.75x16 Promus DES, D1 50p, LCX nl, RI nl, OM1 100 small/chronic, RCA dominant, 20-360m7031m(FFR 0.90->Med Rx), RPDA 50, RPLA 70, EF 60%.  . CMarland KitchenPD (chronic obstructive pulmonary disease) (HCC)    Dr FleRaul Del Former tobacco use   . Gastroesophageal reflux disease   . HTN (hypertension)   . Hyperlipidemia   . Hypokalemia   . Malignant neoplasm of lung ([place]2/12/2017  . Myocardial infarction ([place]/2016   Dr MicSherren Mocha Stented coronary artery    Past Surgical History:  Procedure Laterality Date  . CARDIAC CATHETERIZATION    . CHOLECYSTECTOMY N/A 02/15/2016   Procedure: LAPAROSCOPIC CHOLECYSTECTOMY WITH INTRAOPERATIVE CHOLANGIOGRAM;  Surgeon: JarLeonie GreenD;  Location: [facility] ORS;  Service: General;  Laterality: N/A;  . COLONOSCOPY    . COLONOSCOPY WITH PROPOFOL N/A 08/02/2015   Procedure: COLONOSCOPY WITH PROPOFOL;  Surgeon: RobManya SilvasD;  [facility];  Service: Endoscopy;  Laterality: N/A;  . CORONARY ANGIOPLASTY  04/2014   STENT  . ENDOBRONCHIAL ULTRASOUND N/A 03/26/2018   Procedure: ENDOBRONCHIAL ULTRASOUND;  Surgeon: KasFlora LippsD;  Location: [facility] ORS;  Service: Cardiopulmonary;  Laterality: N/A;  . ESOPHAGOGASTRODUODENOSCOPY (EGD) WITH PROPOFOL N/A  08/02/2015   Procedure: ESOPHAGOGASTRODUODENOSCOPY (EGD) WITH PROPOFOL;  Surgeon: Manya Silvas, MD;  Location:  [facility] ENDOSCOPY;  Service: Endoscopy;  Laterality: N/A;  . FLEXIBLE BRONCHOSCOPY N/A 03/26/2018   Procedure: FLEXIBLE BRONCHOSCOPY;  Surgeon: Flora Lipps, MD;  Location: [facility] ORS;  Service: Cardiopulmonary;  Laterality: N/A;  . KNEE ARTHROSCOPY Left   . LEFT HEART CATHETERIZATION WITH CORONARY ANGIOGRAM N/A 05/03/2014   Procedure: LEFT HEART CATHETERIZATION WITH CORONARY ANGIOGRAM;  Surgeon: Blane Ohara, MD;  Location: [facility] C[facility] LAB;  Service: Cardiovascular;  Laterality: N/A;  . PERCUTANEOUS CORONARY STENT INTERVENTION (PCI-S) N/A 05/05/2014   Procedure: PERCUTANEOUS CORONARY STENT INTERVENTION (PCI-S);  Surgeon: Peter M Martinique, MD;  Location: [facility] CATH LAB;  Service: Cardiovascular;  Laterality: N/A;  . PORTACATH PLACEMENT Left 04/04/2018   Procedure: INSERTION PORT-A-CATH;  Surgeon: [name], MD;  Location: [facility] ORS;  Service: General;  Laterality: Left;    Family History  Problem Relation Age of Onset  . CAD Mother 39       Died of an MI  . CAD Father 47       Died of a massive MI  . COPD Sister   . Lung cancer Brother   . Bone cancer Paternal Uncle   . Colon cancer Neg Hx   . Breast cancer Neg Hx     Social History   Socioeconomic History  . Marital status: Married    Spouse name: Not on file  . Number of children: Not on file  . Years of education: Not on file  . Highest education level: Not on file  Occupational History  . Not on file  Social Needs  . Financial resource strain: Not on file  . Food insecurity    Worry: Not on file    Inability: Not on file  . Transportation needs    Medical: Not on file    Non-medical: Not on file  Tobacco Use  . Smoking status: Former Smoker    Packs/day: 1.00    Years: 40.00    Pack years: 40.00    Types: Cigarettes    Quit date: 02/14/2011    Years since quitting: 7.6  . Smokeless tobacco: Never Used  Substance and Sexual Activity  . Alcohol use: No  . Drug use: No  . Sexual activity: Not on file  Lifestyle  .  Physical activity    Days per week: Not on file    Minutes per session: Not on file  . Stress: Not on file  Relationships  . Social Herbalist on phone: Not on file    Gets together: Not on file    Attends religious service: Not on file    Active member of club or organization: Not on file    Attends meetings of clubs or organizations: Not on file    Relationship status: Not on file  . Intimate partner violence    Fear of current or ex partner: Not on file    Emotionally abused: Not on file    Physically abused: Not on file    Forced sexual activity: Not on file  Other Topics Concern  . Not on file  Social History Narrative   The patient is married. He lives in [place]. He works full-time. He quit smoking in 2013.    Current Outpatient Medications on File Prior to Visit  Medication Sig Dispense Refill  . acetaminophen (TYLENOL) 500 MG tablet Take 1,000 mg by mouth every 6 (six) hours as  needed (for pain.).    Marland Kitchen albuterol (PROAIR HFA) 108 (90 Base) MCG/ACT inhaler Inhale 2 puffs into the lungs every 6 (six) hours as needed for wheezing or shortness of breath.     Marland Kitchen aspirin EC 81 MG tablet Take 81 mg by mouth daily.    Marland Kitchen atorvastatin (LIPITOR) 80 MG tablet Take 1 tablet (80 mg total) by mouth daily at 6 PM. 90 tablet 3  . carvedilol (COREG) 6.25 MG tablet Take 1 tablet (6.25 mg total) by mouth 2 (two) times daily. 180 tablet 3  . colestipol (COLESTID) 1 g tablet Take 2 g by mouth as needed (for stomach issues).     . folic acid (FOLVITE) 1 MG tablet Take 1 tablet (1 mg total) by mouth daily. 90 tablet 1  . lisinopril (ZESTRIL) 10 MG tablet Take 1 tablet (10 mg total) by mouth daily. 90 tablet 3  . nitroGLYCERIN (NITROSTAT) 0.4 MG SL tablet Place 1 tablet (0.4 mg total) under the tongue every 5 (five) minutes as needed for chest pain. X 3 doses 25 tablet 3  . omeprazole (PRILOSEC) 40 MG capsule TAKE 1 CAPSULE BY MOUTH TWICE A DAY 180 capsule 1  . ondansetron (ZOFRAN) 8 MG  tablet PLEASE SEE ATTACHED FOR DETAILED DIRECTIONS    . oxyCODONE-acetaminophen (PERCOCET/ROXICET) 5-325 MG tablet Take by mouth.    . potassium chloride SA (KLOR-CON M20) 20 MEQ tablet Take 1 tablet (20 mEq total) by mouth daily. 90 tablet 3  . sucralfate (CARAFATE) 1 g tablet Take 1 tablet (1 g total) by mouth 3 (three) times daily before meals. Dissolve in warm water, swish and swallow 90 tablet 6  . tamsulosin (FLOMAX) 0.4 MG CAPS capsule Take 0.4 mg by mouth daily after supper.    . traZODone (DESYREL) 50 MG tablet TAKE 1 TABLET BY MOUTH EVERYDAY AT BEDTIME 90 tablet 0  . umeclidinium-vilanterol (ANORO ELLIPTA) 62.5-25 MCG/INH AEPB Inhale 1 puff into the lungs every morning.    . vitamin B-12 (CYANOCOBALAMIN) 1000 MCG tablet Take 1 tablet (1,000 mcg total) by mouth daily. 90 tablet 1  . lidocaine-prilocaine (EMLA) cream APPLY TO AFFECTED AREA ONCE AS DIRECTED    . [DISCONTINUED] prochlorperazine (COMPAZINE) 10 MG tablet Take 1 tablet (10 mg total) by mouth every 6 (six) hours as needed (Nausea or vomiting). 30 tablet 1   No current facility-administered medications on file prior to visit.     No Known Allergies     Observations/Objective: Today's Vitals   10/21/18 1126  PainSc: 0-No pain   There is no height or weight on file to calculate BMI.  Pain level 0 Physical Exam  Constitutional: He is oriented to person, place, and time. No distress.  HENT:  Head: Normocephalic and atraumatic.  Eyes: EOM are normal.  Pulmonary/Chest: Effort normal.  Neurological: He is alert and oriented to person, place, and time.  Skin: No rash noted.  Psychiatric: Affect normal.   CBC    Component Value Date/Time   WBC 5.7 10/07/2018 0823   RBC 4.31 10/07/2018 0823   HGB 13.3 10/07/2018 0823   HGB 15.1 05/03/2014 1457   HCT 40.0 10/07/2018 0823   HCT 44.8 05/03/2014 1457   PLT 206 10/07/2018 0823   PLT 234 05/03/2014 1457   MCV 92.8 10/07/2018 0823   MCV 96 05/03/2014 1457   MCH 30.9  10/07/2018 0823   MCHC 33.3 10/07/2018 0823   RDW 12.3 10/07/2018 0823   RDW 13.3 05/03/2014 1457   LYMPHSABS 0.7  10/07/2018 0823   MONOABS 0.7 10/07/2018 0823   EOSABS 0.2 10/07/2018 0823   BASOSABS 0.0 10/07/2018 0823    CMP     Component Value Date/Time   NA 137 10/07/2018 0823   NA 140 08/12/2015 0827   NA 142 05/03/2014 1457   K 3.9 10/07/2018 0823   K 3.3 (L) 05/03/2014 1457   CL 106 10/07/2018 0823   CL 107 05/03/2014 1457   CO2 25 10/07/2018 0823   CO2 26 05/03/2014 1457   GLUCOSE 101 (H) 10/07/2018 0823   GLUCOSE 89 05/03/2014 1457   BUN 11 10/07/2018 0823   BUN 10 08/12/2015 0827   BUN 10 05/03/2014 1457   CREATININE 1.01 10/07/2018 0823   CREATININE 1.11 05/03/2014 1457   CALCIUM 8.8 (L) 10/07/2018 0823   CALCIUM 8.3 (L) 05/03/2014 1457   PROT 7.1 10/07/2018 0823   PROT 6.5 08/12/2015 0827   ALBUMIN 3.6 10/07/2018 0823   ALBUMIN 4.0 08/12/2015 0827   AST 51 (H) 10/07/2018 0823   ALT 49 (H) 10/07/2018 0823   ALKPHOS 244 (H) 10/07/2018 0823   BILITOT 0.5 10/07/2018 0823   BILITOT 0.4 08/12/2015 0827   GFRNONAA >60 10/07/2018 0823   GFRNONAA >60 05/03/2014 1457   GFRAA >60 10/07/2018 0823   GFRAA >60 05/03/2014 1457     Assessment and Plan: 1. Malignant neoplasm of lower lobe of right lung (HCC)   2. Elevated alkaline phosphatase level    # Stage IIIA lung cancer,  Tolerate Durvaluamb well.  His authorization of durvalumab treatment has expired.  Currently awaiting insurance to approve additional durvalumab treatments.  CT images were independently reviewed and discussed with patient. Evolving radiation changes. No suspicious new lung nodules. Continue surveillance CT every 3 months Discussed with patient.  Elevated ALP/transaminitis, normal GGT, normal bone scan.  I have Referred patient to establish care with gastroenterology for further evaluation. ?  Immunotherapy related liver toxicity. Currently durvalumab is on hold pending insurance  approval. Continue to trend levels.   He will proceed with labs on 5/18 and if lab results meeting criteria and vital signs are normal, he will proceed with Durvalumab treatment on 5/18.   Follow Up Instructions: 1 week.  For assessment prior to next immunotherapy treatment.  I discussed the assessment and treatment plan with the patient. The patient was provided an opportunity to ask questions and all were answered. The patient agreed with the plan and demonstrated an understanding of the instructions.  The patient was advised to call back or seek an in-person evaluation if the symptoms worsen or if the condition fails to improve as anticipated.  Earlie Server, MD 10/21/2018 9:55 PM

## 2018-10-21 NOTE — Progress Notes (Signed)
Patient contacted for telehealth visit. He was originally scheduled for durvalumab treatment but it had to be postponed due to insurance authorization. Patient is aware this make take up to 2 weeks to authorize, but requested dox visit for CT results. No other concerns voiced today.

## 2018-10-24 ENCOUNTER — Other Ambulatory Visit
Admission: RE | Admit: 2018-10-24 | Discharge: 2018-10-24 | Disposition: A | Discharge status: Home / Self Care | Payer: BC Managed Care – PPO | Attending: Gastroenterology | Admitting: Gastroenterology

## 2018-10-24 ENCOUNTER — Encounter: Payer: Self-pay | Admitting: Gastroenterology

## 2018-10-24 ENCOUNTER — Other Ambulatory Visit: Payer: Self-pay

## 2018-10-24 ENCOUNTER — Ambulatory Visit (INDEPENDENT_AMBULATORY_CARE_PROVIDER_SITE_OTHER): Payer: BC Managed Care – PPO | Admitting: Gastroenterology

## 2018-10-24 VITALS — BP 114/64 | HR 99 | Temp 98.3°F | Ht 68.0 in | Wt 150.2 lb

## 2018-10-24 DIAGNOSIS — R748 Abnormal levels of other serum enzymes: Secondary | ICD-10-CM | POA: Insufficient documentation

## 2018-10-24 LAB — GAMMA GT: GGT: 60 U/L — ABNORMAL HIGH (ref 7–50)

## 2018-10-24 NOTE — Progress Notes (Signed)
Gastroenterology Consultation  Referring Provider:     Earlie Server, MD Primary Care Physician:  Baxter Hire, MD Primary Gastroenterologist:  Dr. Allen Norris     Reason for Consultation:     Elevated alkaline phosphatase        HPI:   Timothy Yoder is a 63 y.o. y/o male referred for consultation & management of elevated alkaline phosphatase by Dr. Edwina Barth, Chrystie Nose, MD.  This patient comes in today after being referred by Dr. Tasia Catchings for elevated alkaline phosphatase it appears that the patient's alkaline phosphatase has been elevated since 2016 with the range being from anywhere between 147 and 190.  The patient over the last 2 months there has been an increase in the alkaline phosphatase over 190 with the most recent labs showing it to be 244.  The increased alkaline phosphatase has been an isolated occurrence until the last blood draw that showed the AST to be 51 and the ALT to be 49.  He told me about the schedule this monitor looked at the schedule on epic and so that although would be nerves are all full the mornings were not and I remember you saying that we had a time of screening colonoscopies in the [place] so I asked why my schedule was so you like.  The patient is being followed by oncology for lung cancer.  It appears that the patient had seen Dr. Percell Boston nurse practitioner for the elevated alkaline phosphatase and a fractionation of the alkaline phosphatase showed it to be 84% from the liver although the GGT was normal.    Component     Latest Ref Rng & Units 07/24/2018 08/08/2018 08/22/2018 09/09/2018             AST     15 - 41 U/L _0 ALT     0 - 44 U/L _1 Alkaline Phosphatase     38 - 126 U/L 144 (H) 187 (H) 187 (H) 198 (H)   Component     Latest Ref Rng & Units 09/20/2018 10/07/2018           AST     15 - 41 U/L 29 51 (H)  ALT     0 - 44 U/L 27 49 (H)  Alkaline Phosphatase     38 - 126 U/L 196 (H) 244 (H)    The patient had also been seen by Dr. Percell Boston  nurse practitioner for postprandial diarrhea after having his gallbladder out and was started on colestipol.  The patient reports that his diarrhea is well controlled.  He was started on immunotherapy for his lung cancer and he was wondering if that may have been the cause of his recent increase in AST and ALT.  The patient denies starting any other medications and he has not been on chemotherapy recently.  Past Medical History:  Diagnosis Date  . BPH (benign prostatic hyperplasia)   . CAD (coronary artery disease)    a. 04/2014 anterolateral STEMI/PCI: LM nl, LAD 34m(2.75x16 Promus DES, D1 50p, LCX nl, RI nl, OM1 100 small/chronic, RCA dominant, 20-331m7024m(FFR 0.90->Med Rx), RPDA 50, RPLA 70, EF 60%.  . CMarland KitchenPD (chronic obstructive pulmonary disease) (HCC)    Dr FleRaul Del Former tobacco use   . Gastroesophageal reflux disease   . HTN (hypertension)   . Hyperlipidemia   . Hypokalemia   . Malignant neoplasm of lung ([facility]2017  . Myocardial  infarction ([place]) 04/2014   Dr Sherren Mocha  . Stented coronary artery     Past Surgical History:  Procedure Laterality Date  . CARDIAC CATHETERIZATION    . CHOLECYSTECTOMY N/A 02/15/2016   Procedure: LAPAROSCOPIC CHOLECYSTECTOMY WITH INTRAOPERATIVE CHOLANGIOGRAM;  Surgeon: Leonie Green, MD;  Location: [facility] ORS;  Service: General;  Laterality: N/A;  . COLONOSCOPY    . COLONOSCOPY WITH PROPOFOL N/A 08/02/2015   Procedure: COLONOSCOPY WITH PROPOFOL;  Surgeon: Manya Silvas, MD;  [facility];  Service: Endoscopy;  Laterality: N/A;  . CORONARY ANGIOPLASTY  04/2014   STENT  . ENDOBRONCHIAL ULTRASOUND N/A 03/26/2018   Procedure: ENDOBRONCHIAL ULTRASOUND;  Surgeon: Flora Lipps, MD;  Location: [facility] ORS;  Service: Cardiopulmonary;  Laterality: N/A;  . ESOPHAGOGASTRODUODENOSCOPY (EGD) WITH PROPOFOL N/A 08/02/2015   Procedure: ESOPHAGOGASTRODUODENOSCOPY (EGD) WITH PROPOFOL;  Surgeon: Manya Silvas, MD;  Location: [facility] ENDOSCOPY;   Service: Endoscopy;  Laterality: N/A;  . FLEXIBLE BRONCHOSCOPY N/A 03/26/2018   Procedure: FLEXIBLE BRONCHOSCOPY;  Surgeon: Flora Lipps, MD;  Location: [facility] ORS;  Service: Cardiopulmonary;  Laterality: N/A;  . KNEE ARTHROSCOPY Left   . LEFT HEART CATHETERIZATION WITH CORONARY ANGIOGRAM N/A 05/03/2014   Procedure: LEFT HEART CATHETERIZATION WITH CORONARY ANGIOGRAM;  Surgeon: Blane Ohara, MD;  Location: [facility] CATH LAB;  Service: Cardiovascular;  Laterality: N/A;  . PERCUTANEOUS CORONARY STENT INTERVENTION (PCI-S) N/A 05/05/2014   Procedure: PERCUTANEOUS CORONARY STENT INTERVENTION (PCI-S);  Surgeon: Peter M Martinique, MD;  Location: [facility] CATH LAB;  Service: Cardiovascular;  Laterality: N/A;  . PORTACATH PLACEMENT Left 04/04/2018   Procedure: INSERTION PORT-A-CATH;  Surgeon: [name], MD;  Location: [facility] ORS;  Service: General;  Laterality: Left;    Prior to Admission medications   Medication Sig Start Date End Date Taking? Authorizing Provider  acetaminophen (TYLENOL) 500 MG tablet Take 1,000 mg by mouth every 6 (six) hours as needed (for pain.).    [provider]  albuterol (PROAIR HFA) 108 (90 Base) MCG/ACT inhaler Inhale 2 puffs into the lungs every 6 (six) hours as needed for wheezing or shortness of breath.  06/04/15   [provider]  aspirin EC 81 MG tablet Take 81 mg by mouth daily.    [provider]  atorvastatin (LIPITOR) 80 MG tablet Take 1 tablet (80 mg total) by mouth daily at 6 PM. 08/27/18 08/27/19  Kathlen Mody, Nicki Reaper T, PA-C  carvedilol (COREG) 6.25 MG tablet Take 1 tablet (6.25 mg total) by mouth 2 (two) times daily. 08/27/18 08/27/19  Richardson Dopp T, PA-C  colestipol (COLESTID) 1 g tablet Take 2 g by mouth as needed (for stomach issues).     [provider]  folic acid (FOLVITE) 1 MG tablet Take 1 tablet (1 mg total) by mouth daily. 08/08/18   Earlie Server, MD  lidocaine-prilocaine (EMLA) cream APPLY TO AFFECTED AREA ONCE AS DIRECTED 09/25/18   [provider]  lisinopril (ZESTRIL) 10 MG tablet Take 1 tablet (10 mg total) by mouth daily. 08/27/18 08/27/19  Richardson Dopp T, PA-C  nitroGLYCERIN (NITROSTAT) 0.4 MG SL tablet Place 1 tablet (0.4 mg total) under the tongue every 5 (five) minutes as needed for chest pain. X 3 doses 06/18/17   Sherren Mocha, MD  omeprazole (PRILOSEC) 40 MG capsule TAKE 1 CAPSULE BY MOUTH TWICE A DAY 10/20/18   Earlie Server, MD  ondansetron (ZOFRAN) 8 MG tablet PLEASE SEE ATTACHED FOR DETAILED DIRECTIONS 04/18/18   [provider]  oxyCODONE-acetaminophen (PERCOCET/ROXICET) 5-325 MG tablet Take by mouth. 04/04/18  [provider]  potassium chloride SA (KLOR-CON M20) 20 MEQ tablet Take 1 tablet (20 mEq total) by mouth daily. 08/27/18 08/27/19  Richardson Dopp T, PA-C  sucralfate (CARAFATE) 1 g tablet Take 1 tablet (1 g total) by mouth 3 (three) times daily before meals. Dissolve in warm water, swish and swallow 05/21/18   Noreene Filbert, MD  tamsulosin (FLOMAX) 0.4 MG CAPS capsule Take 0.4 mg by mouth daily after supper.    [provider]  traZODone (DESYREL) 50 MG tablet TAKE 1 TABLET BY MOUTH EVERYDAY AT BEDTIME 10/17/18   Earlie Server, MD  umeclidinium-vilanterol [facility] ELLIPTA) 62.5-25 MCG/INH AEPB Inhale 1 puff into the lungs every morning.    [provider]  vitamin B-12 (CYANOCOBALAMIN) 1000 MCG tablet Take 1 tablet (1,000 mcg total) by mouth daily. 08/08/18   Earlie Server, MD  prochlorperazine (COMPAZINE) 10 MG tablet Take 1 tablet (10 mg total) by mouth every 6 (six) hours as needed (Nausea or vomiting). 04/01/18 06/11/18  Earlie Server, MD    Family History  Problem Relation Age of Onset  . CAD Mother 35       Died of an MI  . CAD Father 23       Died of a massive MI  . COPD Sister   . Lung cancer Brother   . Bone cancer Paternal Uncle   . Colon cancer Neg Hx   . Breast cancer Neg Hx      Social History   Tobacco Use  . Smoking status: Former Smoker    Packs/day: 1.00    Years:  40.00    Pack years: 40.00    Types: Cigarettes    Quit date: 02/14/2011    Years since quitting: 7.6  . Smokeless tobacco: Never Used  Substance Use Topics  . Alcohol use: No  . Drug use: No    Allergies as of 10/24/2018  . (No Known Allergies)    Review of Systems:    All systems reviewed and negative except where noted in HPI.   Physical Exam:  There were no vitals taken for this visit. No LMP for male patient. General:   Alert,  Well-developed, well-nourished, pleasant and cooperative in NAD Head:  Normocephalic and atraumatic. Eyes:  Sclera clear, no icterus.   Conjunctiva pink. Ears:  Normal auditory acuity. Neck:  Supple; no masses or thyromegaly. Lungs:  Respirations even and unlabored.  Clear throughout to auscultation.   No wheezes, crackles, or rhonchi. No acute distress. Heart:  Regular rate and rhythm; no murmurs, clicks, rubs, or gallops. Abdomen:  Normal bowel sounds.  No bruits.  Soft, non-tender and non-distended without masses, hepatosplenomegaly or hernias noted.  No guarding or rebound tenderness.  Negative Carnett sign.   Rectal:  Deferred.  Msk:  Symmetrical without gross deformities.  Good, equal movement & strength bilaterally. Pulses:  Normal pulses noted. Extremities:  No clubbing or edema.  No cyanosis. Neurologic:  Alert and oriented x3;  grossly normal neurologically. Skin:  Intact without significant lesions or rashes.  No jaundice. Lymph Nodes:  No significant cervical adenopathy. Psych:  Alert and cooperative. Normal mood and affect.  Imaging Studies: Ct Chest Wo Contrast  Result Date: 10/15/2018 CLINICAL DATA:  Right lower lobe lung cancer, status post chemotherapy and radiation, currently on immunotherapy. EXAM: CT CHEST WITHOUT CONTRAST TECHNIQUE: Multidetector CT imaging of the chest was performed following the standard protocol without IV contrast. COMPARISON:  07/05/2018 FINDINGS: Cardiovascular: The heart is normal in size. No  pericardial effusion. No evidence of thoracic aortic aneurysm. Ectasia of the ascending thoracic aorta, measuring 3.5 cm. Atherosclerotic calcifications of the aortic arch. Coronary atherosclerosis of the LAD and right coronary artery. LAD stent. Left chest port terminates in the lower SVC. Mediastinum/Nodes: No suspicious mediastinal lymphadenopathy. Visualized thyroid is unremarkable. Lungs/Pleura: Biapical pleural-parenchymal scarring. Moderate centrilobular and paraseptal emphysematous changes. Radiation changes in the medial right lower lobe (series 3/image 129). Prior medial right lower lobe nodule is obscured. Additional radiation changes in the inferomedial right middle lobe (series 3/image 128) and along the anteromedial bilateral upper lobes (series 3/image 89). No suspicious pulmonary nodules. No pleural effusion or pneumothorax. Upper Abdomen: Visualized upper abdomen is notable for prior cholecystectomy and vascular calcifications. Musculoskeletal: Degenerative changes of the visualized thoracolumbar spine. IMPRESSION: Radiation changes, as described above, right lower lobe predominant. Prior medial right lower lobe nodule is obscured. No evidence of metastatic disease. Aortic Atherosclerosis (ICD10-I70.0) and Emphysema (ICD10-J43.9). Electronically Signed   By: Julian Hy M.D.   On: 10/15/2018 10:15   US Abdomen Limited Ruq  Result Date: 09/25/2018 CLINICAL DATA:  Elevated LFTs EXAM: ULTRASOUND ABDOMEN LIMITED RIGHT UPPER QUADRANT COMPARISON:  Ultrasound 01/20/2016.  CT 08/03/2015. FINDINGS: Gallbladder: Prior cholecystectomy Common bile duct: Diameter: Normal caliber, 4 mm. Liver: No focal lesion identified. Within normal limits in parenchymal echogenicity. Portal vein is patent on color Doppler imaging with normal direction of blood flow towards the liver. IMPRESSION: Normal right upper quadrant ultrasound. Electronically Signed   By: Rolm Baptise M.D.   On: 09/25/2018 14:07     Assessment and Plan:   Timothy Yoder is a 63 y.o. y/o male who comes in today with a chronically elevated alkaline phosphatase.  The patient has been evaluated for this in the past but now has increased liver enzymes in addition to his increased alkaline phosphatase.  The patient does not appear to have had antimitochondrial antibody sent off.  He will have lab sent off for other possible causes of his abnormal liver enzymes and he will be checked to see if he needs a vaccination for hepatitisAa and hepatitis B with his existing liver abnormality.  The patient has been told that he may need a liver biopsy in the future to ascertain what exactly is causing his abnormal liver enzymes.  We will also recheck his GGT, fractionate his alkaline phosphatase and recheck his liver enzymes.  The patient has been explained the plan and agrees with it.  Lucilla Lame, MD. Marval Regal    Note: This dictation was prepared with Dragon dictation along with smaller phrase technology. Any transcriptional errors that result from this process are unintentional.

## 2018-10-25 LAB — ALPHA-1-ANTITRYPSIN: A-1 Antitrypsin, Ser: 191 mg/dL — ABNORMAL HIGH (ref 101–187)

## 2018-10-25 LAB — CERULOPLASMIN: Ceruloplasmin: 33.5 mg/dL — ABNORMAL HIGH (ref 16.0–31.0)

## 2018-10-26 LAB — HEPATITIS B SURFACE ANTIGEN: Hepatitis B Surface Ag: NEGATIVE

## 2018-10-26 LAB — HEPATITIS A ANTIBODY, TOTAL: hep A Total Ab: NEGATIVE

## 2018-10-26 LAB — HEPATITIS B SURFACE ANTIBODY,QUALITATIVE: Hep B S Ab: NONREACTIVE

## 2018-10-26 LAB — HEPATITIS C ANTIBODY: HCV Ab: <0.1 s/co ratio (ref 0.0–0.9)

## 2018-10-26 LAB — ANA: Anti Nuclear Antibody (ANA): NEGATIVE

## 2018-10-27 LAB — ALKALINE PHOSPHATASE, ISOENZYMES
Alk Phos Bone Fract: 14 % (ref 12–68)
Alk Phos Liver Fract: 86 % (ref 13–88)
Alk Phos: 310 IU/L — ABNORMAL HIGH (ref 39–117)
Intestinal %: 0 % (ref 0–18)

## 2018-10-28 ENCOUNTER — Inpatient Hospital Stay (HOSPITAL_BASED_OUTPATIENT_CLINIC_OR_DEPARTMENT_OTHER): Payer: BC Managed Care – PPO | Admitting: Oncology

## 2018-10-28 ENCOUNTER — Inpatient Hospital Stay: Payer: BC Managed Care – PPO

## 2018-10-28 ENCOUNTER — Encounter: Payer: Self-pay | Admitting: Oncology

## 2018-10-28 ENCOUNTER — Other Ambulatory Visit: Payer: Self-pay

## 2018-10-28 ENCOUNTER — Inpatient Hospital Stay: Payer: BC Managed Care – PPO | Attending: Oncology

## 2018-10-28 VITALS — BP 119/78 | HR 80 | Temp 96.5°F | Ht 68.0 in | Wt 146.0 lb

## 2018-10-28 DIAGNOSIS — C3431 Malignant neoplasm of lower lobe, right bronchus or lung: Secondary | ICD-10-CM

## 2018-10-28 DIAGNOSIS — Z5112 Encounter for antineoplastic immunotherapy: Secondary | ICD-10-CM | POA: Diagnosis present

## 2018-10-28 DIAGNOSIS — R748 Abnormal levels of other serum enzymes: Secondary | ICD-10-CM | POA: Diagnosis not present

## 2018-10-28 DIAGNOSIS — R740 Nonspecific elevation of levels of transaminase and lactic acid dehydrogenase [LDH]: Secondary | ICD-10-CM | POA: Diagnosis not present

## 2018-10-28 DIAGNOSIS — Z79899 Other long term (current) drug therapy: Secondary | ICD-10-CM | POA: Diagnosis not present

## 2018-10-28 DIAGNOSIS — Z95828 Presence of other vascular implants and grafts: Secondary | ICD-10-CM

## 2018-10-28 LAB — COMPREHENSIVE METABOLIC PANEL
ALT: 33 U/L (ref 0–44)
AST: 34 U/L (ref 15–41)
Albumin: 3.5 g/dL (ref 3.5–5.0)
Alkaline Phosphatase: 284 U/L — ABNORMAL HIGH (ref 38–126)
Anion gap: 9 (ref 5–15)
BUN: 12 mg/dL (ref 8–23)
CO2: 24 mmol/L (ref 22–32)
Calcium: 8.6 mg/dL — ABNORMAL LOW (ref 8.9–10.3)
Chloride: 103 mmol/L (ref 98–111)
Creatinine, Ser: 0.99 mg/dL (ref 0.61–1.24)
GFR calc Af Amer: >60 mL/min (ref >60)
GFR calc non Af Amer: >60 mL/min (ref >60)
Glucose, Bld: 95 mg/dL (ref 70–99)
Potassium: 3.9 mmol/L (ref 3.5–5.1)
Sodium: 136 mmol/L (ref 135–145)
Total Bilirubin: 0.7 mg/dL (ref 0.3–1.2)
Total Protein: 7 g/dL (ref 6.5–8.1)

## 2018-10-28 LAB — CBC WITH DIFFERENTIAL/PLATELET
Abs Immature Granulocytes: 0.07 10*3/uL (ref 0.00–0.07)
Basophils Absolute: 0 10*3/uL (ref 0.0–0.1)
Basophils Relative: 0 %
Eosinophils Absolute: 0.1 10*3/uL (ref 0.0–0.5)
Eosinophils Relative: 2 %
HCT: 38.7 % — ABNORMAL LOW (ref 39.0–52.0)
Hemoglobin: 12.7 g/dL — ABNORMAL LOW (ref 13.0–17.0)
Immature Granulocytes: 1 %
Lymphocytes Relative: 8 %
Lymphs Abs: 0.6 10*3/uL — ABNORMAL LOW (ref 0.7–4.0)
MCH: 29.9 pg (ref 26.0–34.0)
MCHC: 32.8 g/dL (ref 30.0–36.0)
MCV: 91.1 fL (ref 80.0–100.0)
Monocytes Absolute: 0.9 10*3/uL (ref 0.1–1.0)
Monocytes Relative: 13 %
Neutro Abs: 5.5 10*3/uL (ref 1.7–7.7)
Neutrophils Relative %: 76 %
Platelets: 209 10*3/uL (ref 150–400)
RBC: 4.25 MIL/uL (ref 4.22–5.81)
RDW: 13.2 % (ref 11.5–15.5)
WBC: 7.2 10*3/uL (ref 4.0–10.5)
nRBC: 0 % (ref 0.0–0.2)

## 2018-10-28 LAB — MITOCHONDRIAL ANTIBODIES: Mitochondrial M2 Ab, IgG: <20 Units (ref 0.0–20.0)

## 2018-10-28 LAB — TSH: TSH: 0.557 u[IU]/mL (ref 0.350–4.500)

## 2018-10-28 MED ORDER — HEPARIN SOD (PORK) LOCK FLUSH 100 UNIT/ML IV SOLN
500.0000 [IU] | Freq: Once | INTRAVENOUS | Status: AC
  Administered 2018-10-28: 500 [IU] via INTRAVENOUS
  Filled 2018-10-28: qty 5

## 2018-10-28 MED ORDER — SODIUM CHLORIDE 0.9% FLUSH
10.0000 mL | Freq: Once | INTRAVENOUS | Status: AC
  Administered 2018-10-28: 10 mL via INTRAVENOUS
  Filled 2018-10-28: qty 10

## 2018-10-28 NOTE — Progress Notes (Signed)
Hematology/Oncology follow up note Timothy Yoder Telephone:([phone] Fax:(336) [phone]   Patient Care Team: Baxter Hire, MD as PCP - General (Internal Medicine) Sherren Mocha, MD as PCP - Cardiology (Cardiology) Telford Nab, RN as Registered Nurse  REFERRING PROVIDER: Dr.Fleming REASON FOR VISIT:  Follow-up for management of stage III non-small cell lung cancer   HISTORY OF PRESENTING ILLNESS:  Timothy Yoder is a  63 y.o.  male with PMH listed below who was referred to me for evaluation of lung mass.  Patient is a former smoker quit smoking 7 years ago.  40-pack-year. Patient chest lung cancer screening on 03/11/2018. CT scan showed 2.4 cm right lower lobe nodule with a new necrotic.  Subcarinal lymph node. PET scan showed hypermetabolic right lower pulmonary nodule, consistent with primary bronchogenic carcinoma.  Fluid density subcarinal structure demonstrate no significant hypermetabolism.  Although this could represent an incidental benign lesion such as a cyst given the interval development since 02/24/2017, necrotic adenopathy cannot be excluded and tissue sampling should be considered.  Patient was seen by Dr. Faith Rogue for initial evaluation on 03/13/2018.  Patient has not got PET scan at that time. Today patient was accompanied by his wife to the oncology clinic to discuss management plan.  And PET scan results. Patient reports chronic shortness of breath with moderate exertion.  Appetite is good denies any fever, chills, hemoptysis, weight loss.  # underwent biopsy via bronchoscopy.  Subcarinal lymph node biopsy positive for non-small cell lung cancer, favoring poorly differentiated adenocarcinoma. #Mediport was placed by Dr. Genevive Bi   # Cancer Treatment 04/26/2018 -06/11/2018 Concurrent Chemotherapy Daneil Dolin and Taxol] with RT.  07/10/2018 Started on maintenance Durvalumab.  INTERVAL HISTORY Timothy Yoder is a 63 y.o. male who has above  history reviewed by me today presents for follow up visit for management of stage III non-small cell lung cancer.  Patient has been on maintenance immunotherapy. Patient has tolerated immunotherapy well except that alkaline phosphatase progressively getting worse. He was referred to gastroenterology and was seen by Dr. Verl Blalock last week. He also has had lab work done today. Today he reports feeling well at baseline. Fatigue slightly worse.  Chronic onset.  No aggravating or improving factors.  Review of Systems  Constitutional: Positive for fatigue. Negative for appetite change, chills, fever and unexpected weight change.  HENT:   Negative for hearing loss and voice change.   Eyes: Negative for eye problems and icterus.  Respiratory: Negative for chest tightness, cough and shortness of breath.   Cardiovascular: Negative for chest pain and leg swelling.  Gastrointestinal: Negative for abdominal distention and abdominal pain.  Endocrine: Negative for hot flashes.  Genitourinary: Negative for difficulty urinating, dysuria and frequency.   Musculoskeletal: Negative for arthralgias.  Skin: Negative for itching and rash.  Neurological: Negative for light-headedness and numbness.  Hematological: Negative for adenopathy. Does not bruise/bleed easily.  Psychiatric/Behavioral: Negative for confusion.   MEDICAL HISTORY:  Past Medical History:  Diagnosis Date  . BPH (benign prostatic hyperplasia)   . CAD (coronary artery disease)    a. 04/2014 anterolateral STEMI/PCI: LM nl, LAD 35m(2.75x16 Promus DES, D1 50p, LCX nl, RI nl, OM1 100 small/chronic, RCA dominant, [id number](FFR 0.90->Med Rx), RPDA 50, RPLA 70, EF 60%.  . CMarland KitchenPD (chronic obstructive pulmonary disease) (HCC)    Dr FleRaul Del Former tobacco use   . Gastroesophageal reflux disease   . HTN (hypertension)   . Hyperlipidemia   . Hypokalemia   .  Malignant neoplasm of lung ([place]) 04/01/2018  . Myocardial infarction ([place]) 04/2014   Dr  Sherren Mocha  . Stented coronary artery     SURGICAL HISTORY: Past Surgical History:  Procedure Laterality Date  . CARDIAC CATHETERIZATION    . CHOLECYSTECTOMY N/A 02/15/2016   Procedure: LAPAROSCOPIC CHOLECYSTECTOMY WITH INTRAOPERATIVE CHOLANGIOGRAM;  Surgeon: Leonie Green, MD;  Location: [facility] ORS;  Service: General;  Laterality: N/A;  . COLONOSCOPY    . COLONOSCOPY WITH PROPOFOL N/A 08/02/2015   Procedure: COLONOSCOPY WITH PROPOFOL;  Surgeon: Manya Silvas, MD;  Location[facility] ENDOSCOPY;  Service: Endoscopy;  Laterality: N/A;  . CORONARY ANGIOPLASTY  04/2014   STENT  . ENDOBRONCHIAL ULTRASOUND N/A 03/26/2018   Procedure: ENDOBRONCHIAL ULTRASOUND;  Surgeon: Flora Lipps, MD;  Location: [facility] ORS;  Service: Cardiopulmonary;  Laterality: N/A;  . ESOPHAGOGASTRODUODENOSCOPY (EGD) WITH PROPOFOL N/A 08/02/2015   Procedure: ESOPHAGOGASTRODUODENOSCOPY (EGD) WITH PROPOFOL;  Surgeon: Manya Silvas, MD;  Location: [facility] ENDOSCOPY;  Service: Endoscopy;  Laterality: N/A;  . FLEXIBLE BRONCHOSCOPY N/A 03/26/2018   Procedure: FLEXIBLE BRONCHOSCOPY;  Surgeon: Flora Lipps, MD;  Location: [facility] ORS;  Service: Cardiopulmonary;  Laterality: N/A;  . KNEE ARTHROSCOPY Left   . LEFT HEART CATHETERIZATION WITH CORONARY ANGIOGRAM N/A 05/03/2014   Procedure: LEFT HEART CATHETERIZATION WITH CORONARY ANGIOGRAM;  Surgeon: Blane Ohara, MD;  Location: [facility] CATH LAB;  Service: Cardiovascular;  Laterality: N/A;  . PERCUTANEOUS CORONARY STENT INTERVENTION (PCI-S) N/A 05/05/2014   Procedure: PERCUTANEOUS CORONARY STENT INTERVENTION (PCI-S);  Surgeon: Peter M Martinique, MD;  Location: [facility] CATH LAB;  Service: Cardiovascular;  Laterality: N/A;  . PORTACATH PLACEMENT Left 04/04/2018   Procedure: INSERTION PORT-A-CATH;  Surgeon: [name], MD;  Location: [facility] ORS;  Service: General;  Laterality: Left;    SOCIAL HISTORY: Social History   Socioeconomic History  . Marital status: Married    Spouse name: Not on file   . Number of children: Not on file  . Years of education: Not on file  . Highest education level: Not on file  Occupational History  . Not on file  Social Needs  . Financial resource strain: Not on file  . Food insecurity    Worry: Not on file    Inability: Not on file  . Transportation needs    Medical: Not on file    Non-medical: Not on file  Tobacco Use  . Smoking status: Former Smoker    Packs/day: 1.00    Years: 40.00    Pack years: 40.00    Types: Cigarettes    Quit date: 02/14/2011    Years since quitting: 7.7  . Smokeless tobacco: Never Used  Substance and Sexual Activity  . Alcohol use: No  . Drug use: No  . Sexual activity: Not on file  Lifestyle  . Physical activity    Days per week: Not on file    Minutes per session: Not on file  . Stress: Not on file  Relationships  . Social Herbalist on phone: Not on file    Gets together: Not on file    Attends religious service: Not on file    Active member of club or organization: Not on file    Attends meetings of clubs or organizations: Not on file    Relationship status: Not on file  . Intimate partner violence    Fear of current or ex partner: Not on file    Emotionally abused: Not on file    Physically abused: Not on  file    Forced sexual activity: Not on file  Other Topics Concern  . Not on file  Social History Narrative   The patient is married. He lives in [place]. He works full-time. He quit smoking in 2013.    FAMILY HISTORY: Family History  Problem Relation Age of Onset  . CAD Mother 60       Died of an MI  . CAD Father 38       Died of a massive MI  . COPD Sister   . Lung cancer Brother   . Bone cancer Paternal Uncle   . Colon cancer Neg Hx   . Breast cancer Neg Hx     ALLERGIES:  has No Known Allergies.  MEDICATIONS:  Current Outpatient Medications  Medication Sig Dispense Refill  . acetaminophen (TYLENOL) 500 MG tablet Take 1,000 mg by mouth every 6 (six) hours as  needed (for pain.).    Marland Kitchen albuterol (PROAIR HFA) 108 (90 Base) MCG/ACT inhaler Inhale 2 puffs into the lungs every 6 (six) hours as needed for wheezing or shortness of breath.     Marland Kitchen aspirin EC 81 MG tablet Take 81 mg by mouth daily.    Marland Kitchen atorvastatin (LIPITOR) 80 MG tablet Take 1 tablet (80 mg total) by mouth daily at 6 PM. 90 tablet 3  . carvedilol (COREG) 6.25 MG tablet Take 1 tablet (6.25 mg total) by mouth 2 (two) times daily. 180 tablet 3  . colestipol (COLESTID) 1 g tablet Take 2 g by mouth as needed (for stomach issues).     . folic acid (FOLVITE) 1 MG tablet Take 1 tablet (1 mg total) by mouth daily. 90 tablet 1  . lidocaine-prilocaine (EMLA) cream APPLY TO AFFECTED AREA ONCE AS DIRECTED    . lisinopril (ZESTRIL) 10 MG tablet Take 1 tablet (10 mg total) by mouth daily. 90 tablet 3  . nitroGLYCERIN (NITROSTAT) 0.4 MG SL tablet Place 1 tablet (0.4 mg total) under the tongue every 5 (five) minutes as needed for chest pain. X 3 doses 25 tablet 3  . omeprazole (PRILOSEC) 40 MG capsule TAKE 1 CAPSULE BY MOUTH TWICE A DAY 180 capsule 1  . ondansetron (ZOFRAN) 8 MG tablet PLEASE SEE ATTACHED FOR DETAILED DIRECTIONS    . oxyCODONE-acetaminophen (PERCOCET/ROXICET) 5-325 MG tablet Take by mouth.    . potassium chloride SA (KLOR-CON M20) 20 MEQ tablet Take 1 tablet (20 mEq total) by mouth daily. 90 tablet 3  . sucralfate (CARAFATE) 1 g tablet Take 1 tablet (1 g total) by mouth 3 (three) times daily before meals. Dissolve in warm water, swish and swallow 90 tablet 6  . tamsulosin (FLOMAX) 0.4 MG CAPS capsule Take 0.4 mg by mouth daily after supper.    . traZODone (DESYREL) 50 MG tablet TAKE 1 TABLET BY MOUTH EVERYDAY AT BEDTIME 90 tablet 0  . umeclidinium-vilanterol (ANORO ELLIPTA) 62.5-25 MCG/INH AEPB Inhale 1 puff into the lungs every morning.    . vitamin B-12 (CYANOCOBALAMIN) 1000 MCG tablet Take 1 tablet (1,000 mcg total) by mouth daily. 90 tablet 1   No current facility-administered medications  for this visit.      PHYSICAL EXAMINATION: ECOG PERFORMANCE STATUS: 0 - Asymptomatic Vitals:   10/28/18 0909  BP: 119/78  Pulse: 80  Temp: (!) 96.5 F (35.8 C)   Filed Weights   10/28/18 0909  Weight: 146 lb (66.2 kg)    Physical Exam Constitutional:      General: He is not in acute distress.  HENT:     Head: Normocephalic and atraumatic.  Eyes:     General: No scleral icterus.    Pupils: Pupils are equal, round, and reactive to light.  Neck:     Musculoskeletal: Normal range of motion and neck supple.  Cardiovascular:     Rate and Rhythm: Normal rate and regular rhythm.     Heart sounds: Normal heart sounds.  Pulmonary:     Effort: Pulmonary effort is normal. No respiratory distress.     Breath sounds: No wheezing.  Abdominal:     General: Bowel sounds are normal. There is no distension.     Palpations: Abdomen is soft. There is no mass.     Tenderness: There is no abdominal tenderness.  Musculoskeletal: Normal range of motion.        General: No deformity.  Skin:    General: Skin is warm and dry.     Findings: No erythema or rash.  Neurological:     Mental Status: He is alert and oriented to person, place, and time.     Cranial Nerves: No cranial nerve deficit.     Coordination: Coordination normal.  Psychiatric:        Behavior: Behavior normal.        Thought Content: Thought content normal.      LABORATORY DATA:  I have reviewed the data as listed Lab Results  Component Value Date   WBC 7.2 10/28/2018   HGB 12.7 (L) 10/28/2018   HCT 38.7 (L) 10/28/2018   MCV 91.1 10/28/2018   PLT 209 10/28/2018   Recent Labs    09/20/18 1348 10/07/18 0823 10/28/18 0844  NA 140 137 136  K 3.8 3.9 3.9  CL 107 106 103  CO2 _0 GLUCOSE 105* 101* 95  BUN _1 CREATININE 1.21 1.01 0.99  CALCIUM 8.8* 8.8* 8.6*  GFRNONAA >60 >60 >60  GFRAA >60 >60 >60  PROT 6.9 7.1 7.0  ALBUMIN 3.6 3.6 3.5  AST 29 51* 34  ALT 27 49* 33  ALKPHOS 196* 244* 284*   BILITOT 0.5 0.5 0.7   Iron/TIBC/Ferritin/ %Sat No results found for: IRON, TIBC, FERRITIN, IRONPCTSAT  RADIOGRAPHIC STUDIES: I have personally reviewed the radiological images as listed and agreed with the findings in the report.. 03/11/2018 CT chest lung screen 1. New 2.4 cm right lower lobe nodule with a new necrotic appearing subcarinal lymph node. Lung-RADS 4B, suspicious. Additional imaging evaluation or consultation with Pulmonology or Thoracic Surgery recommended. These results will be called to the ordering clinician or representative by the Radiologist Assistant, and communication documented in the PACS or zVision Dashboard. 2. Aortic atherosclerosis (ICD10-170.0). Coronary arterycalcification.3.  Emphysema (ICD10-J43.9). 03/13/2018 PET scan # Hypermetabolic right lower lobe pulmonary nodule, consistent with primary bronchogenic carcinoma. Fluid density subcarinal structure demonstrates no significant hypermetabolism. Although this could represent an incidental benign lesion such as a bronchogenic cyst, given interval development since 02/24/2017, necrotic adenopathy cannot be excluded and tissue sampling should be considered.  03/26/2018 DIAGNOSIS:  A. SUBCARINA, CYSTIC MASS; FNA:  - NON-SMALL CELL CARCINOMA CONSISTENT WITH POORLY DIFFERENTIATED  ADENOCARCINOMA.   MRI brain 04/01/2018  Normal MRI head with and without contrast for age.  No intracranial metastasis.  07/05/2018 CT chest w contrast #Significant partial treatment response.  Medial right lower lobe pulmonary nodule is significantly decreased.  Subcarinal adenopathy has resolved. No new or progressive metastatic disease in the chest.  Aortic sclerosis and emphysema.   ASSESSMENT & PLAN:  1. Malignant neoplasm of lower lobe of right lung ([place])   2. Port-A-Cath in place   3. Elevated alkaline phosphatase level   Cancer Staging Malignant neoplasm of lower lobe of right lung [facility]) Staging form: Lung, AJCC 8th Edition  - Clinical stage from 05/13/2018: Stage IIIA (cT1c, cN2, cM0) - Signed by Earlie Server, MD on 05/13/2018  #Stage IIIA lung adenocarcinoma Labs are reviewed and discussed with patient. Hold immunotherapy today. Awaiting GI recommendation on elevated alkaline phosphatase .  Patient has had blood work done for GI work-up .  Hepatitis B surface antigen negative, hepatitis B surface antibody nonreactive, negative hepatitis A antibody. Hepatitis C antibody less than 0.1. Negative ANA, elevated GGT Negative mitochondria antibody IgG Ceruloplasmin 33.5, mildly elevated.Alpha 1 antitrypsin 191, slightly elevated- ?inflammation  His alkaline phosphatase gradually trending up, elevated GGT Questionable liver toxicity secondary to immunotherapy, usually causes transaminitis. . Transaminitis has resolved.   Discussed with Dr.Wohl. He will discuss with patient.  Chart reviewed and Dr.Wohl has recommended liver biopsy.  I called patient and he will update me when liver biopsy is scheduled. I will see him 2 weeks after liver biopsy to discuss the plan.  .Return of visit: to be determined.   We spent sufficient time to discuss many aspect of care, questions were answered to patient's satisfaction. Total face to face encounter time for this patient visit was 25 min. >50% of the time was  spent in counseling and coordination of care.   Earlie Server, MD, PhD 10/28/18

## 2018-10-28 NOTE — Progress Notes (Signed)
Patient stated that he had been doing well with no complaints. 

## 2018-10-29 LAB — ANTI-SMOOTH MUSCLE ANTIBODY, IGG: F-Actin IgG: 10 Units (ref 0–19)

## 2018-10-30 ENCOUNTER — Telehealth: Payer: Self-pay | Admitting: Gastroenterology

## 2018-10-30 NOTE — Telephone Encounter (Signed)
Patient called & states he had labs drawn last week & would like the results.

## 2018-10-30 NOTE — Telephone Encounter (Signed)
Pt review labs and advise.

## 2018-10-30 NOTE — Telephone Encounter (Signed)
The patient know that I'm sorry did not get back him sooner I been in the hospital on call.  Also tell him that his liver enzymes came down and he is alkaline phosphatase is lower than it was before while the AST and ALT are back to normal.  The fractionation again showed it to be mostly from the liver. The antimitochondrial antibody was negative for possible PBC. At this point a liver biopsy would be recommended.

## 2018-10-30 NOTE — Telephone Encounter (Signed)
Pt notified of lab results. Pt will be set up for a liver biopsy.

## 2018-11-01 ENCOUNTER — Encounter: Payer: Self-pay | Admitting: Oncology

## 2018-11-04 ENCOUNTER — Ambulatory Visit: Payer: BLUE CROSS/BLUE SHIELD | Admitting: Gastroenterology

## 2018-11-04 ENCOUNTER — Other Ambulatory Visit: Payer: Self-pay

## 2018-11-04 DIAGNOSIS — R748 Abnormal levels of other serum enzymes: Secondary | ICD-10-CM

## 2018-11-04 NOTE — Progress Notes (Signed)
Us/

## 2018-11-06 ENCOUNTER — Encounter: Payer: Self-pay | Admitting: Oncology

## 2018-11-06 ENCOUNTER — Telehealth: Payer: Self-pay | Admitting: Oncology

## 2018-11-06 NOTE — Telephone Encounter (Signed)
Called patient after reviewing patient's my chart message.  Patient expresses his frustrations about not having liver biopsy done quick enough.  He is concerned about missing immunotherapy. Splane to patient that proceeding with immunotherapy without knowing what is going on a his liver may potentially exacerbate his liver condition and cause more damage.  Reassurance was given. Awaiting liver biopsy which is already scheduled next week for further decision of immunotherapy.  Patient appreciated discussion and agrees with the plan.

## 2018-11-11 ENCOUNTER — Telehealth: Payer: Self-pay | Admitting: Gastroenterology

## 2018-11-11 NOTE — Telephone Encounter (Signed)
Timothy Yoder with [employer] called  & stated that [phone] approving non clinical criteria meet. refcall # [id number]. If any question please call Timothy Yoder @ [phone].

## 2018-11-13 ENCOUNTER — Other Ambulatory Visit: Payer: Self-pay | Admitting: Radiology

## 2018-11-14 ENCOUNTER — Ambulatory Visit
Admission: RE | Admit: 2018-11-14 | Discharge: 2018-11-14 | Disposition: A | Discharge status: Home / Self Care | Payer: BC Managed Care – PPO | Source: Ambulatory Visit | Attending: Gastroenterology | Admitting: Gastroenterology

## 2018-11-14 ENCOUNTER — Other Ambulatory Visit: Payer: Self-pay

## 2018-11-14 DIAGNOSIS — I10 Essential (primary) hypertension: Secondary | ICD-10-CM | POA: Insufficient documentation

## 2018-11-14 DIAGNOSIS — I252 Old myocardial infarction: Secondary | ICD-10-CM | POA: Diagnosis not present

## 2018-11-14 DIAGNOSIS — R748 Abnormal levels of other serum enzymes: Secondary | ICD-10-CM | POA: Diagnosis not present

## 2018-11-14 DIAGNOSIS — J449 Chronic obstructive pulmonary disease, unspecified: Secondary | ICD-10-CM | POA: Insufficient documentation

## 2018-11-14 DIAGNOSIS — Z87891 Personal history of nicotine dependence: Secondary | ICD-10-CM | POA: Insufficient documentation

## 2018-11-14 DIAGNOSIS — Z8249 Family history of ischemic heart disease and other diseases of the circulatory system: Secondary | ICD-10-CM | POA: Insufficient documentation

## 2018-11-14 DIAGNOSIS — I2510 Atherosclerotic heart disease of native coronary artery without angina pectoris: Secondary | ICD-10-CM | POA: Insufficient documentation

## 2018-11-14 DIAGNOSIS — Z79899 Other long term (current) drug therapy: Secondary | ICD-10-CM | POA: Diagnosis not present

## 2018-11-14 DIAGNOSIS — E785 Hyperlipidemia, unspecified: Secondary | ICD-10-CM | POA: Diagnosis not present

## 2018-11-14 DIAGNOSIS — E876 Hypokalemia: Secondary | ICD-10-CM | POA: Diagnosis not present

## 2018-11-14 DIAGNOSIS — R740 Nonspecific elevation of levels of transaminase and lactic acid dehydrogenase [LDH]: Secondary | ICD-10-CM | POA: Insufficient documentation

## 2018-11-14 DIAGNOSIS — Z7901 Long term (current) use of anticoagulants: Secondary | ICD-10-CM | POA: Diagnosis not present

## 2018-11-14 DIAGNOSIS — K219 Gastro-esophageal reflux disease without esophagitis: Secondary | ICD-10-CM | POA: Insufficient documentation

## 2018-11-14 DIAGNOSIS — Z7982 Long term (current) use of aspirin: Secondary | ICD-10-CM | POA: Diagnosis not present

## 2018-11-14 LAB — PROTIME-INR
INR: 1.1 (ref 0.8–1.2)
Prothrombin Time: 13.7 seconds (ref 11.4–15.2)

## 2018-11-14 LAB — CBC
HCT: 39.9 % (ref 39.0–52.0)
Hemoglobin: 12.7 g/dL — ABNORMAL LOW (ref 13.0–17.0)
MCH: 29.3 pg (ref 26.0–34.0)
MCHC: 31.8 g/dL (ref 30.0–36.0)
MCV: 92.1 fL (ref 80.0–100.0)
Platelets: 213 10*3/uL (ref 150–400)
RBC: 4.33 MIL/uL (ref 4.22–5.81)
RDW: 13.6 % (ref 11.5–15.5)
WBC: 6.3 10*3/uL (ref 4.0–10.5)
nRBC: 0 % (ref 0.0–0.2)

## 2018-11-14 MED ORDER — MIDAZOLAM HCL 5 MG/5ML IJ SOLN
INTRAMUSCULAR | Status: AC
  Filled 2018-11-14: qty 5

## 2018-11-14 MED ORDER — FENTANYL CITRATE (PF) 100 MCG/2ML IJ SOLN
INTRAMUSCULAR | Status: AC | PRN
  Administered 2018-11-14: 50 ug via INTRAVENOUS

## 2018-11-14 MED ORDER — FENTANYL CITRATE (PF) 100 MCG/2ML IJ SOLN
INTRAMUSCULAR | Status: AC
  Filled 2018-11-14: qty 4

## 2018-11-14 MED ORDER — MIDAZOLAM HCL 5 MG/5ML IJ SOLN
INTRAMUSCULAR | Status: AC | PRN
  Administered 2018-11-14: 1 mg via INTRAVENOUS

## 2018-11-14 MED ORDER — SODIUM CHLORIDE 0.9 % IV SOLN
INTRAVENOUS | Status: DC
  Administered 2018-11-14: 1000 mL via INTRAVENOUS

## 2018-11-14 NOTE — H&P (Signed)
Chief Complaint:  transaminitis  Referring Physician(s): [place]  Supervising Physician: Corrie Mckusick  Patient Status: [facility] - Out-pt  History of Present Illness: Timothy Yoder is a 63 y.o. male presenting for a medical liver biopsy.   He denies any symptoms at this time.   Past Medical History:  Diagnosis Date  . BPH (benign prostatic hyperplasia)   . CAD (coronary artery disease)    a. 04/2014 anterolateral STEMI/PCI: LM nl, LAD 49m(2.75x16 Promus DES, D1 50p, LCX nl, RI nl, OM1 100 small/chronic, RCA dominant, 20-381m7050m(FFR 0.90->Med Rx), RPDA 50, RPLA 70, EF 60%.  . CMarland KitchenPD (chronic obstructive pulmonary disease) (HCC)    Dr FleRaul Del Former tobacco use   . Gastroesophageal reflux disease   . HTN (hypertension)   . Hyperlipidemia   . Hypokalemia   . [place]  . [place]   Dr MicSherren Mocha Stented coronary artery     Past Surgical History:  Procedure Laterality Date  . CARDIAC CATHETERIZATION    . CHOLECYSTECTOMY N/A 02/15/2016   Procedure: LAPAROSCOPIC CHOLECYSTECTOMY WITH INTRAOPERATIVE CHOLANGIOGRAM;  Surgeon: JarLeonie GreenD;  Location: [facility] ORS;  Service: General;  Laterality: N/A;  . COLONOSCOPY    . COLONOSCOPY WITH PROPOFOL N/A 08/02/2015   Procedure: COLONOSCOPY WITH PROPOFOL;  Surgeon: RobManya SilvasD;  [facility];  Service: Endoscopy;  Laterality: N/A;  . CORONARY ANGIOPLASTY  04/2014   STENT  . ENDOBRONCHIAL ULTRASOUND N/A 03/26/2018   Procedure: ENDOBRONCHIAL ULTRASOUND;  Surgeon: KasFlora LippsD;  Location: [facility] ORS;  Service: Cardiopulmonary;  Laterality: N/A;  . ESOPHAGOGASTRODUODENOSCOPY (EGD) WITH PROPOFOL N/A 08/02/2015   Procedure: ESOPHAGOGASTRODUODENOSCOPY (EGD) WITH PROPOFOL;  Surgeon: RobManya SilvasD;  [facility];  Service: Endoscopy;  Laterality: N/A;  . FLEXIBLE BRONCHOSCOPY N/A 03/26/2018   Procedure: FLEXIBLE  BRONCHOSCOPY;  Surgeon: KasFlora LippsD;  Location: [facility] ORS;  Service: Cardiopulmonary;  Laterality: N/A;  . KNEE ARTHROSCOPY Left   . LEFT HEART CATHETERIZATION WITH CORONARY ANGIOGRAM N/A 05/03/2014   Procedure: LEFT HEART CATHETERIZATION WITH CORONARY ANGIOGRAM;  Surgeon: MicBlane OharaD;  [facility];  Service: Cardiovascular;  Laterality: N/A;  . PERCUTANEOUS CORONARY STENT INTERVENTION (PCI-S) N/A 05/05/2014   Procedure: PERCUTANEOUS CORONARY STENT INTERVENTION (PCI-S);  Surgeon: Peter M JorMartiniqueD;  [facility];  Service: Cardiovascular;  Laterality: N/A;  . PORTACATH PLACEMENT Left 04/04/2018   Procedure: INSERTION PORT-A-CATH;  Surgeon: OakNestor LewandowskyD;  Location: [facility] ORS;  Service: General;  Laterality: Left;    Allergies: Patient has no known allergies.  Medications: Prior to Admission medications   Medication Sig Start Date End Date Taking? Authorizing Provider  atorvastatin (LIPITOR) 80 MG tablet Take 1 tablet (80 mg total) by mouth daily at 6 PM. 08/27/18 08/27/19 Yes Weaver, Scott T, PA-C  carvedilol (COREG) 6.25 MG tablet Take 1 tablet (6.25 mg total) by mouth 2 (two) times daily. 08/27/18 08/27/19 Yes Weaver, Scott T, PA-C  folic acid (FOLVITE) 1 MG tablet Take 1 tablet (1 mg total) by mouth daily. 08/08/18  Yes Yu,Earlie ServerD  lisinopril (ZESTRIL) 10 MG tablet Take 1 tablet (10 mg total) by mouth daily. 08/27/18 08/27/19 Yes Weaver, Scott T, PA-C  omeprazole (PRILOSEC) 40 MG capsule TAKE 1 CAPSULE BY MOUTH TWICE A DAY 10/20/18  Yes Yu,Earlie ServerD  potassium chloride SA (KLOR-CON M20) 20 MEQ tablet Take 1 tablet (20 mEq total) by mouth daily.  08/27/18 08/27/19 Yes Weaver, Scott T, PA-C  tamsulosin (FLOMAX) 0.4 MG CAPS capsule Take 0.4 mg by mouth daily after supper.   Yes [provider]  traZODone (DESYREL) 50 MG tablet TAKE 1 TABLET BY MOUTH EVERYDAY AT BEDTIME 10/17/18  Yes Earlie Server, MD  umeclidinium-vilanterol [facility] ELLIPTA) 62.5-25 MCG/INH AEPB Inhale 1  puff into the lungs every morning.   Yes [provider]  vitamin B-12 (CYANOCOBALAMIN) 1000 MCG tablet Take 1 tablet (1,000 mcg total) by mouth daily. 08/08/18  Yes Earlie Server, MD  acetaminophen (TYLENOL) 500 MG tablet Take 1,000 mg by mouth every 6 (six) hours as needed (for pain.).    [provider]  albuterol (PROAIR HFA) 108 (90 Base) MCG/ACT inhaler Inhale 2 puffs into the lungs every 6 (six) hours as needed for wheezing or shortness of breath.  06/04/15   [provider]  aspirin EC 81 MG tablet Take 81 mg by mouth daily.    [provider]  colestipol (COLESTID) 1 g tablet Take 2 g by mouth as needed (for stomach issues).     [provider]  lidocaine-prilocaine (EMLA) cream APPLY TO AFFECTED AREA ONCE AS DIRECTED 09/25/18   [provider]  nitroGLYCERIN (NITROSTAT) 0.4 MG SL tablet Place 1 tablet (0.4 mg total) under the tongue every 5 (five) minutes as needed for chest pain. X 3 doses 06/18/17   Sherren Mocha, MD  ondansetron [facility]) 8 MG tablet PLEASE SEE ATTACHED FOR DETAILED DIRECTIONS 04/18/18   [provider]  oxyCODONE-acetaminophen (PERCOCET/ROXICET) 5-325 MG tablet Take by mouth. 04/04/18   [provider]  sucralfate (CARAFATE) 1 g tablet Take 1 tablet (1 g total) by mouth 3 (three) times daily before meals. Dissolve in warm water, swish and swallow 05/21/18   Noreene Filbert, MD  prochlorperazine (COMPAZINE) 10 MG tablet Take 1 tablet (10 mg total) by mouth every 6 (six) hours as needed (Nausea or vomiting). 04/01/18 06/11/18  Earlie Server, MD     Family History  Problem Relation Age of Onset  . CAD Mother 66       Died of an MI  . CAD Father 44       Died of a massive MI  . COPD Sister   . Lung cancer Brother   . Bone cancer Paternal Uncle   . Colon cancer Neg Hx   . Breast cancer Neg Hx     Social History   Socioeconomic History  . Marital status: Married    Spouse name: Not on file  . Number of  children: Not on file  . Years of education: Not on file  . Highest education level: Not on file  Occupational History  . Not on file  Social Needs  . Financial resource strain: Not on file  . Food insecurity    Worry: Not on file    Inability: Not on file  . Transportation needs    Medical: Not on file    Non-medical: Not on file  Tobacco Use  . Smoking status: Former Smoker    Packs/day: 1.00    Years: 40.00    Pack years: 40.00    Types: Cigarettes    Quit date: 02/14/2011    Years since quitting: 7.7  . Smokeless tobacco: Never Used  Substance and Sexual Activity  . Alcohol use: No  . Drug use: No  . Sexual activity: Not on file  Lifestyle  . Physical activity    Days per week: Not on  file    Minutes per session: Not on file  . Stress: Not on file  Relationships  . Social Herbalist on phone: Not on file    Gets together: Not on file    Attends religious service: Not on file    Active member of club or organization: Not on file    Attends meetings of clubs or organizations: Not on file    Relationship status: Not on file  Other Topics Concern  . Not on file  Social History Narrative   The patient is married. He lives in [place]. He works full-time. He quit smoking in 2013.      Review of Systems: A 12 point ROS discussed and pertinent positives are indicated in the HPI above.  All other systems are negative.  Review of Systems  Vital Signs: BP 115/76   Pulse 74   Temp 98 F (36.7 C) (Oral)   Resp 16   Ht _0  (1.727 m)   Wt 63.5 kg   SpO2 98%   BMI 21.29 kg/m   Physical Exam General: 63 yo male appearing   stated age.  Well-developed, well-nourished.  No distress. HEENT: Atraumatic, normocephalic.  Conjugate gaze, extra-ocular motor intact. No scleral icterus or scleral injection. No lesions on external ears, nose, lips, or gums.  Oral mucosa moist, pink.  Neck: Symmetric with no goiter enlargement.  Chest/Lungs:  Symmetric chest  with inspiration/expiration.  No labored breathing.  Clear to auscultation with no wheezes, rhonchi, or rales.  Heart:  RRR, with no third heart sounds appreciated. No JVD appreciated.  Abdomen:  Soft, NT/ND, with + bowel sounds.   Genito-urinary: Deferred Neurologic: Alert & Oriented to person, place, and time.   Normal affect and insight.  Appropriate questions.  Moving all 4 extremities with gross sensory intact.   .  Imaging: No results found.  Labs:  CBC: Recent Labs    09/20/18 1348 10/07/18 0823 10/28/18 0844 11/14/18 1022  WBC 6.2 5.7 7.2 6.3  HGB 13.4 13.3 12.7* 12.7*  HCT 40.8 40.0 38.7* 39.9  PLT 182 206 209 213    COAGS: Recent Labs    11/14/18 1022  INR 1.1    BMP: Recent Labs    09/09/18 0844 09/20/18 1348 10/07/18 0823 10/28/18 0844  NA 139 140 137 136  K 3.9 3.8 3.9 3.9  CL 106 107 106 103  CO2 _1 GLUCOSE 85 105* 101* 95  BUN _2 CALCIUM 9.0 8.8* 8.8* 8.6*  CREATININE 0.96 1.21 1.01 0.99  GFRNONAA >60 >60 >60 >60  GFRAA >60 >60 >60 >60    LIVER FUNCTION TESTS: Recent Labs    09/09/18 0844 09/20/18 1348 10/07/18 0823 10/28/18 0844  BILITOT 0.6 0.5 0.5 0.7  AST 23 29 51* 34  ALT 17 27 49* 33  ALKPHOS 198* 196* 244* 284*  PROT 7.1 6.9 7.1 7.0  ALBUMIN 3.6 3.6 3.6 3.5    TUMOR MARKERS: No results for input(s): AFPTM, CEA, CA199, CHROMGRNA in the last 8760 hours.  Assessment and Plan:  62 yo male with transaminitis, presents for medical liver biopsy.   Risks and benefits of liver biopsy was discussed with the patient and/or patient's family including, but not limited to bleeding, infection, damage to adjacent structures or low yield requiring additional tests.  All of the questions were answered and there is agreement to proceed.  Consent signed and in chart.   Thank you for  this interesting consult.  I greatly enjoyed meeting MANNING LUNA and look forward to participating in their care.  A copy of this  report was sent to the requesting provider on this date.  Electronically Signed: Corrie Mckusick, DO 11/14/2018, 11:51 AM   I spent a total of  15 Minutes   in face to face in clinical consultation, greater than 50% of which was counseling/coordinating care for liver biopsy

## 2018-11-14 NOTE — Procedures (Signed)
Interventional Radiology Procedure Note  Procedure:  US guided liver biopsy, random  Complications: None Recommendations:  - Ok to shower tomorrow - Do not submerge for 7 days - Routine care   Signed,  Dulcy Fanny. Earleen Newport, DO

## 2018-11-18 ENCOUNTER — Other Ambulatory Visit: Payer: Self-pay

## 2018-11-18 ENCOUNTER — Ambulatory Visit
Admission: RE | Admit: 2018-11-18 | Discharge: 2018-11-18 | Disposition: A | Discharge status: Home / Self Care | Payer: BC Managed Care – PPO | Source: Ambulatory Visit | Attending: Radiation Oncology | Admitting: Radiation Oncology

## 2018-11-18 ENCOUNTER — Encounter: Payer: Self-pay | Admitting: Radiation Oncology

## 2018-11-18 VITALS — BP 118/75 | HR 59 | Temp 97.2°F | Resp 16 | Wt 143.6 lb

## 2018-11-18 DIAGNOSIS — Z9221 Personal history of antineoplastic chemotherapy: Secondary | ICD-10-CM | POA: Diagnosis not present

## 2018-11-18 DIAGNOSIS — R748 Abnormal levels of other serum enzymes: Secondary | ICD-10-CM | POA: Diagnosis not present

## 2018-11-18 DIAGNOSIS — Z923 Personal history of irradiation: Secondary | ICD-10-CM | POA: Insufficient documentation

## 2018-11-18 DIAGNOSIS — C3431 Malignant neoplasm of lower lobe, right bronchus or lung: Secondary | ICD-10-CM | POA: Diagnosis not present

## 2018-11-18 NOTE — Progress Notes (Signed)
Radiation Oncology Follow up Note  Name: Timothy Yoder   Date:   11/18/2018 MRN:  [id number] DOB: 1956/02/25    This 63 y.o. male presents to the clinic today for 4-month follow-up status post concurrent chemoradiation therapy for stage IIIa non-small cell lung cancer of the right lower lobe.  REFERRING PROVIDER: Baxter Hire, MD  HPI: Patient is a 63 year old male now about 5 months having completed concurrent chemoradiation therapy for a stage IIIa non-small cell lung cancer (T2 N2 M0) of the right lower lobe.  Seen today in routine follow-up clinically he is doing well he has had some bump in his liver enzymes is recently had a CT-guided biopsy of his liver with pathology pending.  He specifically denies cough hemoptysis or chest tightness.  Most recent CT scan.  Which I have reviewed showed radiation changes in the right lower lobe with no evidence of metastatic disease or progression of disease.  He is currently undergoing immunotherapy with durvalumab.  Liver biopsy is performed to see if this related to his immunotherapy.  COMPLICATIONS OF TREATMENT: none  FOLLOW UP COMPLIANCE: keeps appointments   PHYSICAL EXAM:  BP 118/75 (BP Location: Left Arm, Patient Position: Sitting)   Pulse (!) 59   Temp (!) 97.2 F (36.2 C) (Tympanic)   Resp 16   Wt 143 lb 10.1 oz (65.2 kg)   BMI 21.84 kg/m  Well-developed well-nourished patient in NAD. HEENT reveals PERLA, EOMI, discs not visualized.  Oral cavity is clear. No oral mucosal lesions are identified. Neck is clear without evidence of cervical or supraclavicular adenopathy. Lungs are clear to A&P. Cardiac examination is essentially unremarkable with regular rate and rhythm without murmur rub or thrill. Abdomen is benign with no organomegaly or masses noted. Motor sensory and DTR levels are equal and symmetric in the upper and lower extremities. Cranial nerves II through XII are grossly intact. Proprioception is intact. No peripheral adenopathy  or edema is identified. No motor or sensory levels are noted. Crude visual fields are within normal range.  RADIOLOGY RESULTS: CT scans reviewed compatible with above-stated findings  PLAN: Present time he is doing well radiologically under control with stable chest.  I am pleased with his overall progress.  I have checked on his pathology that is still pending of his liver biopsy.  Of asked to see him back in 6 months for follow-up and then will start once a year follow-up visits.  He continues close follow-up care with medical oncology.  I would like to take this opportunity to thank you for allowing me to participate in the care of your patient.Noreene Filbert, MD

## 2018-11-19 LAB — SURGICAL PATHOLOGY

## 2018-11-20 ENCOUNTER — Encounter: Payer: Self-pay | Admitting: Oncology

## 2018-11-21 ENCOUNTER — Inpatient Hospital Stay: Payer: BC Managed Care – PPO

## 2018-11-21 ENCOUNTER — Inpatient Hospital Stay (HOSPITAL_BASED_OUTPATIENT_CLINIC_OR_DEPARTMENT_OTHER): Payer: BC Managed Care – PPO | Admitting: Oncology

## 2018-11-21 ENCOUNTER — Other Ambulatory Visit: Payer: Self-pay

## 2018-11-21 ENCOUNTER — Encounter: Payer: Self-pay | Admitting: Oncology

## 2018-11-21 VITALS — BP 116/72 | HR 64 | Temp 96.6°F | Resp 16 | Ht 68.0 in | Wt 144.1 lb

## 2018-11-21 DIAGNOSIS — R740 Nonspecific elevation of levels of transaminase and lactic acid dehydrogenase [LDH]: Secondary | ICD-10-CM

## 2018-11-21 DIAGNOSIS — C3431 Malignant neoplasm of lower lobe, right bronchus or lung: Secondary | ICD-10-CM | POA: Diagnosis not present

## 2018-11-21 DIAGNOSIS — R748 Abnormal levels of other serum enzymes: Secondary | ICD-10-CM | POA: Diagnosis not present

## 2018-11-21 DIAGNOSIS — Z5112 Encounter for antineoplastic immunotherapy: Secondary | ICD-10-CM | POA: Diagnosis not present

## 2018-11-21 DIAGNOSIS — Z95828 Presence of other vascular implants and grafts: Secondary | ICD-10-CM

## 2018-11-21 DIAGNOSIS — R7401 Elevation of levels of liver transaminase levels: Secondary | ICD-10-CM

## 2018-11-21 LAB — CBC WITH DIFFERENTIAL/PLATELET
Abs Immature Granulocytes: 0.05 10*3/uL (ref 0.00–0.07)
Basophils Absolute: 0 10*3/uL (ref 0.0–0.1)
Basophils Relative: 0 %
Eosinophils Absolute: 0.1 10*3/uL (ref 0.0–0.5)
Eosinophils Relative: 2 %
HCT: 36.9 % — ABNORMAL LOW (ref 39.0–52.0)
Hemoglobin: 12.1 g/dL — ABNORMAL LOW (ref 13.0–17.0)
Immature Granulocytes: 1 %
Lymphocytes Relative: 10 %
Lymphs Abs: 0.7 10*3/uL (ref 0.7–4.0)
MCH: 29.4 pg (ref 26.0–34.0)
MCHC: 32.8 g/dL (ref 30.0–36.0)
MCV: 89.6 fL (ref 80.0–100.0)
Monocytes Absolute: 0.7 10*3/uL (ref 0.1–1.0)
Monocytes Relative: 11 %
Neutro Abs: 5.1 10*3/uL (ref 1.7–7.7)
Neutrophils Relative %: 76 %
Platelets: 201 10*3/uL (ref 150–400)
RBC: 4.12 MIL/uL — ABNORMAL LOW (ref 4.22–5.81)
RDW: 13.9 % (ref 11.5–15.5)
WBC: 6.7 10*3/uL (ref 4.0–10.5)
nRBC: 0 % (ref 0.0–0.2)

## 2018-11-21 LAB — COMPREHENSIVE METABOLIC PANEL
ALT: 30 U/L (ref 0–44)
AST: 31 U/L (ref 15–41)
Albumin: 3.3 g/dL — ABNORMAL LOW (ref 3.5–5.0)
Alkaline Phosphatase: 233 U/L — ABNORMAL HIGH (ref 38–126)
Anion gap: 7 (ref 5–15)
BUN: 9 mg/dL (ref 8–23)
CO2: 25 mmol/L (ref 22–32)
Calcium: 8.7 mg/dL — ABNORMAL LOW (ref 8.9–10.3)
Chloride: 105 mmol/L (ref 98–111)
Creatinine, Ser: 0.9 mg/dL (ref 0.61–1.24)
GFR calc Af Amer: >60 mL/min (ref >60)
GFR calc non Af Amer: >60 mL/min (ref >60)
Glucose, Bld: 95 mg/dL (ref 70–99)
Potassium: 4 mmol/L (ref 3.5–5.1)
Sodium: 137 mmol/L (ref 135–145)
Total Bilirubin: 0.6 mg/dL (ref 0.3–1.2)
Total Protein: 6.6 g/dL (ref 6.5–8.1)

## 2018-11-21 LAB — IRON AND TIBC
Iron: 41 ug/dL — ABNORMAL LOW (ref 45–182)
Saturation Ratios: 16 % — ABNORMAL LOW (ref 17.9–39.5)
TIBC: 260 ug/dL (ref 250–450)
UIBC: 219 ug/dL

## 2018-11-21 LAB — FERRITIN: Ferritin: 150 ng/mL (ref 24–336)

## 2018-11-21 LAB — TSH: TSH: 0.844 u[IU]/mL (ref 0.350–4.500)

## 2018-11-21 MED ORDER — SODIUM CHLORIDE 0.9 % IV SOLN
Freq: Once | INTRAVENOUS | Status: AC
  Administered 2018-11-21: 10:00:00 via INTRAVENOUS
  Filled 2018-11-21: qty 250

## 2018-11-21 MED ORDER — SODIUM CHLORIDE 0.9% FLUSH
10.0000 mL | Freq: Once | INTRAVENOUS | Status: AC
  Administered 2018-11-21: 10 mL via INTRAVENOUS
  Filled 2018-11-21: qty 10

## 2018-11-21 MED ORDER — HEPARIN SOD (PORK) LOCK FLUSH 100 UNIT/ML IV SOLN
500.0000 [IU] | Freq: Once | INTRAVENOUS | Status: AC | PRN
  Administered 2018-11-21: 500 [IU]
  Filled 2018-11-21: qty 5

## 2018-11-21 MED ORDER — SODIUM CHLORIDE 0.9 % IV SOLN
10.0000 mg/kg | Freq: Once | INTRAVENOUS | Status: AC
  Administered 2018-11-21: 11:00:00 700 mg via INTRAVENOUS
  Filled 2018-11-21: qty 10

## 2018-11-21 NOTE — Progress Notes (Signed)
Hematology/Oncology follow up note [facility] Telephone:(336) [phone] Fax:(336) [phone]   Patient Care Team: Baxter Hire, MD as PCP - General (Internal Medicine) Sherren Mocha, MD as PCP - Cardiology (Cardiology) Telford Nab, RN as Registered Nurse  REFERRING PROVIDER: Dr.Fleming REASON FOR VISIT:  Follow-up for management of stage III non-small cell lung cancer   HISTORY OF PRESENTING ILLNESS:  Timothy Yoder is a  63 y.o.  male with PMH listed below who was referred to me for evaluation of lung mass.  Patient is a former smoker quit smoking 7 years ago.  40-pack-year. Patient chest lung cancer screening on 03/11/2018. CT scan showed 2.4 cm right lower lobe nodule with a new necrotic.  Subcarinal lymph node. PET scan showed hypermetabolic right lower pulmonary nodule, consistent with primary bronchogenic carcinoma.  Fluid density subcarinal structure demonstrate no significant hypermetabolism.  Although this could represent an incidental benign lesion such as a cyst given the interval development since 02/24/2017, necrotic adenopathy cannot be excluded and tissue sampling should be considered.  Patient was seen by Dr. Faith Rogue for initial evaluation on 03/13/2018.  Patient has not got PET scan at that time. Today patient was accompanied by his wife to the oncology clinic to discuss management plan.  And PET scan results. Patient reports chronic shortness of breath with moderate exertion.  Appetite is good denies any fever, chills, hemoptysis, weight loss.  # underwent biopsy via bronchoscopy.  Subcarinal lymph node biopsy positive for non-small cell lung cancer, favoring poorly differentiated adenocarcinoma. #Mediport was placed by Dr. Genevive Bi  #Elevated alkaline phosphatase Patient has had blood work done for GI work-up .  Hepatitis B surface antigen negative, hepatitis B surface antibody nonreactive, negative hepatitis A antibody. Hepatitis C antibody  less than 0.1. Negative ANA, elevated GGT Negative mitochondria antibody IgG Ceruloplasmin 33.5, mildly elevated.Alpha 1 antitrypsin 191, slightly elevated- ?inflammation   # Cancer Treatment 04/26/2018 -06/11/2018 Concurrent Chemotherapy [weekly Carbo and Taxol] with RT.  07/10/2018 Started on maintenance Durvalumab.  INTERVAL HISTORY Timothy Yoder is a 63 y.o. male who has above history reviewed by me today presents for follow up visit for management of stage III non-small cell lung cancer.  Patient has been on maintenance immunotherapy. Immunotherapy has been held due to persistently progressively elevated alkaline phosphatase. Patient has been referred to gastroenterology and was seen and evaluated by Dr. Allen Norris.  He had liver biopsy on 11/14/2018. Today he presents for reassessment prior to resuming immunotherapy. Denies any new complaints. Chronic fatigue at baseline. Denies any shortness of breath, chest pain abdominal pain.   Review of Systems  Constitutional: Positive for fatigue. Negative for appetite change, chills, fever and unexpected weight change.  HENT:   Negative for hearing loss and voice change.   Eyes: Negative for eye problems and icterus.  Respiratory: Negative for chest tightness, cough and shortness of breath.   Cardiovascular: Negative for chest pain and leg swelling.  Gastrointestinal: Negative for abdominal distention and abdominal pain.  Endocrine: Negative for hot flashes.  Genitourinary: Negative for difficulty urinating, dysuria and frequency.   Musculoskeletal: Negative for arthralgias.  Skin: Negative for itching and rash.  Neurological: Negative for light-headedness and numbness.  Hematological: Negative for adenopathy. Does not bruise/bleed easily.  Psychiatric/Behavioral: Negative for confusion.   MEDICAL HISTORY:  Past Medical History:  Diagnosis Date  . BPH (benign prostatic hyperplasia)   . CAD (coronary artery disease)    a. 04/2014  anterolateral STEMI/PCI: LM nl, LAD 17m(2.75x16 Promus DES, D1  50p, LCX nl, RI nl, OM1 100 small/chronic, RCA dominant, 20-32m 71m (FFR 0.90->Med Rx), RPDA 50, RPLA 70, EF 60%.  . Marland KitchenOPD (chronic obstructive pulmonary disease) (HCC)    Dr FlRaul Del. Former tobacco use   . Gastroesophageal reflux disease   . HTN (hypertension)   . Hyperlipidemia   . Hypokalemia   . Malignant neoplasm of lung ([facility]12/12/2017  . Myocardial infarction ([facility]/2016   Dr MiSherren Mocha. Stented coronary artery     SURGICAL HISTORY: Past Surgical History:  Procedure Laterality Date  . CARDIAC CATHETERIZATION    . CHOLECYSTECTOMY N/A 02/15/2016   Procedure: LAPAROSCOPIC CHOLECYSTECTOMY WITH INTRAOPERATIVE CHOLANGIOGRAM;  Surgeon: JaLeonie GreenMD;  Location: [facility] ORS;  Service: General;  Laterality: N/A;  . COLONOSCOPY    . COLONOSCOPY WITH PROPOFOL N/A 08/02/2015   Procedure: COLONOSCOPY WITH PROPOFOL;  Surgeon: RoManya SilvasMD;  [facility];  Service: Endoscopy;  Laterality: N/A;  . CORONARY ANGIOPLASTY  04/2014   STENT  . ENDOBRONCHIAL ULTRASOUND N/A 03/26/2018   Procedure: ENDOBRONCHIAL ULTRASOUND;  Surgeon: KaFlora LippsMD;  Location: [facility] ORS;  Service: Cardiopulmonary;  Laterality: N/A;  . ESOPHAGOGASTRODUODENOSCOPY (EGD) WITH PROPOFOL N/A 08/02/2015   Procedure: ESOPHAGOGASTRODUODENOSCOPY (EGD) WITH PROPOFOL;  Surgeon: RoManya SilvasMD;  [facility];  Service: Endoscopy;  Laterality: N/A;  . FLEXIBLE BRONCHOSCOPY N/A 03/26/2018   Procedure: FLEXIBLE BRONCHOSCOPY;  Surgeon: KaFlora LippsMD;  Location: [facility];  Service: Cardiopulmonary;  Laterality: N/A;  . KNEE ARTHROSCOPY Left   . LEFT HEART CATHETERIZATION WITH CORONARY ANGIOGRAM N/A 05/03/2014   Procedure: LEFT HEART CATHETERIZATION WITH CORONARY ANGIOGRAM;  Surgeon: MiBlane OharaMD;  [facility];  Service: Cardiovascular;  Laterality: N/A;  . PERCUTANEOUS CORONARY STENT INTERVENTION  (PCI-S) N/A 05/05/2014   Procedure: PERCUTANEOUS CORONARY STENT INTERVENTION (PCI-S);  Surgeon: Peter M JoMartiniqueMD;  [facility];  Service: Cardiovascular;  Laterality: N/A;  . PORTACATH PLACEMENT Left 04/04/2018   Procedure: INSERTION PORT-A-CATH;  Surgeon: OaNestor LewandowskyMD;  Location: [facility];  Service: General;  Laterality: Left;    SOCIAL HISTORY: Social History   Socioeconomic History  . Marital status: Married    Spouse name: Not on file  . Number of children: Not on file  . Years of education: Not on file  . Highest education level: Not on file  Occupational History  . Not on file  Social Needs  . Financial resource strain: Not on file  . Food insecurity    Worry: Not on file    Inability: Not on file  . Transportation needs    Medical: Not on file    Non-medical: Not on file  Tobacco Use  . Smoking status: Former Smoker    Packs/day: 1.00    Years: 40.00    Pack years: 40.00    Types: Cigarettes    Quit date: 02/14/2011    Years since quitting: 7.7  . Smokeless tobacco: Never Used  Substance and Sexual Activity  . Alcohol use: No  . Drug use: No  . Sexual activity: Not on file  Lifestyle  . Physical activity    Days per week: Not on file    Minutes per session: Not on file  . Stress: Not on file  Relationships  . Social coHerbalistn phone: Not on file    Gets together: Not on file    Attends religious service: Not on file    Active  member of club or organization: Not on file    Attends meetings of clubs or organizations: Not on file    Relationship status: Not on file  . Intimate partner violence    Fear of current or ex partner: Not on file    Emotionally abused: Not on file    Physically abused: Not on file    Forced sexual activity: Not on file  Other Topics Concern  . Not on file  Social History Narrative   The patient is married. He lives in [place]. He works full-time. He quit smoking in 2013.    FAMILY HISTORY:  Family History  Problem Relation Age of Onset  . CAD Mother 16       Died of an MI  . CAD Father 87       Died of a massive MI  . COPD Sister   . Lung cancer Brother   . Bone cancer Paternal Uncle   . Colon cancer Neg Hx   . Breast cancer Neg Hx     ALLERGIES:  has No Known Allergies.  MEDICATIONS:  Current Outpatient Medications  Medication Sig Dispense Refill  . acetaminophen (TYLENOL) 500 MG tablet Take 1,000 mg by mouth every 6 (six) hours as needed (for pain.).    Marland Kitchen albuterol (PROAIR HFA) 108 (90 Base) MCG/ACT inhaler Inhale 2 puffs into the lungs every 6 (six) hours as needed for wheezing or shortness of breath.     Marland Kitchen aspirin EC 81 MG tablet Take 81 mg by mouth daily.    Marland Kitchen atorvastatin (LIPITOR) 80 MG tablet Take 1 tablet (80 mg total) by mouth daily at 6 PM. 90 tablet 3  . carvedilol (COREG) 6.25 MG tablet Take 1 tablet (6.25 mg total) by mouth 2 (two) times daily. 180 tablet 3  . colestipol (COLESTID) 1 g tablet Take 2 g by mouth as needed (for stomach issues).     . folic acid (FOLVITE) 1 MG tablet Take 1 tablet (1 mg total) by mouth daily. 90 tablet 1  . lidocaine-prilocaine (EMLA) cream APPLY TO AFFECTED AREA ONCE AS DIRECTED    . lisinopril (ZESTRIL) 10 MG tablet Take 1 tablet (10 mg total) by mouth daily. 90 tablet 3  . nitroGLYCERIN (NITROSTAT) 0.4 MG SL tablet Place 1 tablet (0.4 mg total) under the tongue every 5 (five) minutes as needed for chest pain. X 3 doses 25 tablet 3  . omeprazole (PRILOSEC) 40 MG capsule TAKE 1 CAPSULE BY MOUTH TWICE A DAY 180 capsule 1  . ondansetron (ZOFRAN) 8 MG tablet PLEASE SEE ATTACHED FOR DETAILED DIRECTIONS    . oxyCODONE-acetaminophen (PERCOCET/ROXICET) 5-325 MG tablet Take by mouth.    . potassium chloride SA (KLOR-CON M20) 20 MEQ tablet Take 1 tablet (20 mEq total) by mouth daily. 90 tablet 3  . sucralfate (CARAFATE) 1 g tablet Take 1 tablet (1 g total) by mouth 3 (three) times daily before meals. Dissolve in warm water, swish and  swallow 90 tablet 6  . tamsulosin (FLOMAX) 0.4 MG CAPS capsule Take 0.4 mg by mouth daily after supper.    . traZODone (DESYREL) 50 MG tablet TAKE 1 TABLET BY MOUTH EVERYDAY AT BEDTIME 90 tablet 0  . umeclidinium-vilanterol (ANORO ELLIPTA) 62.5-25 MCG/INH AEPB Inhale 1 puff into the lungs every morning.    . vitamin B-12 (CYANOCOBALAMIN) 1000 MCG tablet Take 1 tablet (1,000 mcg total) by mouth daily. 90 tablet 1   No current facility-administered medications for this visit.  PHYSICAL EXAMINATION: ECOG PERFORMANCE STATUS: 0 - Asymptomatic Vitals:   11/21/18 0941  BP: 116/72  Pulse: 64  Resp: 16  Temp: (!) 96.6 F (35.9 C)  SpO2: 99%   Filed Weights   11/21/18 0941  Weight: 144 lb 2 oz (65.4 kg)    Physical Exam Constitutional:      General: He is not in acute distress. HENT:     Head: Normocephalic and atraumatic.  Eyes:     General: No scleral icterus.    Pupils: Pupils are equal, round, and reactive to light.  Neck:     Musculoskeletal: Normal range of motion and neck supple.  Cardiovascular:     Rate and Rhythm: Normal rate and regular rhythm.     Heart sounds: Normal heart sounds.  Pulmonary:     Effort: Pulmonary effort is normal. No respiratory distress.     Breath sounds: No wheezing.  Abdominal:     General: Bowel sounds are normal. There is no distension.     Palpations: Abdomen is soft. There is no mass.     Tenderness: There is no abdominal tenderness.  Musculoskeletal: Normal range of motion.        General: No deformity.  Skin:    General: Skin is warm and dry.     Findings: No erythema or rash.  Neurological:     Mental Status: He is alert and oriented to person, place, and time.     Cranial Nerves: No cranial nerve deficit.     Coordination: Coordination normal.  Psychiatric:        Behavior: Behavior normal.        Thought Content: Thought content normal.      LABORATORY DATA:  I have reviewed the data as listed Lab Results   Component Value Date   WBC 6.7 11/21/2018   HGB 12.1 (L) 11/21/2018   HCT 36.9 (L) 11/21/2018   MCV 89.6 11/21/2018   PLT 201 11/21/2018   Recent Labs    10/07/18 0823 10/28/18 0844 11/21/18 0850  NA 137 136 137  K 3.9 3.9 4.0  CL 106 103 105  CO2 _0 GLUCOSE 101* 95 95  BUN _1 CREATININE 1.01 0.99 0.90  CALCIUM 8.8* 8.6* 8.7*  GFRNONAA >60 >60 >60  GFRAA >60 >60 >60  PROT 7.1 7.0 6.6  ALBUMIN 3.6 3.5 3.3*  AST 51* 34 31  ALT 49* 33 30  ALKPHOS 244* 284* 233*  BILITOT 0.5 0.7 0.6   Iron/TIBC/Ferritin/ %Sat    Component Value Date/Time   IRON 41 (L) 11/21/2018 0856   TIBC 260 11/21/2018 0856   FERRITIN 150 11/21/2018 0856   IRONPCTSAT 16 (L) 11/21/2018 0856    RADIOGRAPHIC STUDIES: I have personally reviewed the radiological images as listed and agreed with the findings in the report.. 03/11/2018 CT chest lung screen 1. New 2.4 cm right lower lobe nodule with a new necrotic appearing subcarinal lymph node. Lung-RADS 4B, suspicious. Additional imaging evaluation or consultation with Pulmonology or Thoracic Surgery recommended. These results will be called to the ordering clinician or representative by the Radiologist Assistant, and communication documented in the PACS or zVision Dashboard. 2. Aortic atherosclerosis (ICD10-170.0). Coronary arterycalcification.3.  Emphysema (ICD10-J43.9). 03/13/2018 PET scan # Hypermetabolic right lower lobe pulmonary nodule, consistent with primary bronchogenic carcinoma. Fluid density subcarinal structure demonstrates no significant hypermetabolism. Although this could represent an incidental benign lesion such as a bronchogenic cyst, given interval development since 02/24/2017, necrotic adenopathy cannot  be excluded and tissue sampling should be considered.  03/26/2018 DIAGNOSIS:  A. SUBCARINA, CYSTIC MASS; FNA:  - NON-SMALL CELL CARCINOMA CONSISTENT WITH POORLY DIFFERENTIATED  ADENOCARCINOMA.   MRI brain 04/01/2018   Normal MRI head with and without contrast for age.  No intracranial metastasis.  07/05/2018 CT chest w contrast #Significant partial treatment response.  Medial right lower lobe pulmonary nodule is significantly decreased.  Subcarinal adenopathy has resolved. No new or progressive metastatic disease in the chest.  Aortic sclerosis and emphysema.   ASSESSMENT & PLAN:  1. Encounter for antineoplastic immunotherapy   2. Transaminitis   3. Malignant neoplasm of lower lobe of right lung (HCC)   4. Elevated alkaline phosphatase level   Cancer Staging Malignant neoplasm of lower lobe of right lung [facility]) Staging form: Lung, AJCC 8th Edition - Clinical stage from 05/13/2018: Stage IIIA (cT1c, cN2, cM0) - Signed by Earlie Server, MD on 05/13/2018  #Elevated alkaline phosphatase. Patient has been evaluated by gastroenterology.  Liver biopsy showed mild sinusoidal dilation and congestion.  Minimal changes suggestive of hepatic process. Iron stain demonstrate minimal focal hepatocellular iron. I discussed with Dr.Wohl via secure chat about patient's pathology result.  No restriction of proceeding with immunotherapy.  Continue watch for the labs.  #Stage IIIA lung adenocarcinoma Labs reviewed and discussed with patient. Counts acceptable to proceed with maintenance durvalumab treatment.  Liver biopsy showed minimal I will obtain iron panel.  We spent sufficient time to discuss many aspect of care, questions were answered to patient's satisfaction. Total face to face encounter time for this patient visit was 25 min. >50% of the time was  spent in counseling and coordination of care.    Earlie Server, MD, PhD 11/21/18

## 2018-11-21 NOTE — Addendum Note (Signed)
Addended by: Mallie Snooks I on: 11/21/2018 09:38 AM   Modules accepted: Orders

## 2018-12-09 ENCOUNTER — Inpatient Hospital Stay: Payer: BC Managed Care – PPO | Attending: Oncology

## 2018-12-09 ENCOUNTER — Inpatient Hospital Stay: Payer: BC Managed Care – PPO

## 2018-12-09 ENCOUNTER — Inpatient Hospital Stay (HOSPITAL_BASED_OUTPATIENT_CLINIC_OR_DEPARTMENT_OTHER): Payer: BC Managed Care – PPO | Admitting: Oncology

## 2018-12-09 ENCOUNTER — Other Ambulatory Visit: Payer: Self-pay

## 2018-12-09 ENCOUNTER — Encounter: Payer: Self-pay | Admitting: Oncology

## 2018-12-09 VITALS — Resp 18

## 2018-12-09 VITALS — BP 133/80 | HR 67 | Temp 97.2°F | Wt 145.8 lb

## 2018-12-09 DIAGNOSIS — R5383 Other fatigue: Secondary | ICD-10-CM | POA: Diagnosis not present

## 2018-12-09 DIAGNOSIS — C3431 Malignant neoplasm of lower lobe, right bronchus or lung: Secondary | ICD-10-CM

## 2018-12-09 DIAGNOSIS — R740 Nonspecific elevation of levels of transaminase and lactic acid dehydrogenase [LDH]: Secondary | ICD-10-CM

## 2018-12-09 DIAGNOSIS — Z5112 Encounter for antineoplastic immunotherapy: Secondary | ICD-10-CM | POA: Diagnosis present

## 2018-12-09 DIAGNOSIS — R748 Abnormal levels of other serum enzymes: Secondary | ICD-10-CM | POA: Diagnosis not present

## 2018-12-09 DIAGNOSIS — R7401 Elevation of levels of liver transaminase levels: Secondary | ICD-10-CM

## 2018-12-09 DIAGNOSIS — Z79899 Other long term (current) drug therapy: Secondary | ICD-10-CM | POA: Diagnosis not present

## 2018-12-09 LAB — CBC WITH DIFFERENTIAL/PLATELET
Abs Immature Granulocytes: 0.05 10*3/uL (ref 0.00–0.07)
Basophils Absolute: 0.1 10*3/uL (ref 0.0–0.1)
Basophils Relative: 1 %
Eosinophils Absolute: 0.1 10*3/uL (ref 0.0–0.5)
Eosinophils Relative: 2 %
HCT: 37.8 % — ABNORMAL LOW (ref 39.0–52.0)
Hemoglobin: 12.4 g/dL — ABNORMAL LOW (ref 13.0–17.0)
Immature Granulocytes: 1 %
Lymphocytes Relative: 14 %
Lymphs Abs: 0.9 10*3/uL (ref 0.7–4.0)
MCH: 29.5 pg (ref 26.0–34.0)
MCHC: 32.8 g/dL (ref 30.0–36.0)
MCV: 90 fL (ref 80.0–100.0)
Monocytes Absolute: 0.7 10*3/uL (ref 0.1–1.0)
Monocytes Relative: 11 %
Neutro Abs: 4.7 10*3/uL (ref 1.7–7.7)
Neutrophils Relative %: 71 %
Platelets: 197 10*3/uL (ref 150–400)
RBC: 4.2 MIL/uL — ABNORMAL LOW (ref 4.22–5.81)
RDW: 15 % (ref 11.5–15.5)
WBC: 6.5 10*3/uL (ref 4.0–10.5)
nRBC: 0 % (ref 0.0–0.2)

## 2018-12-09 LAB — COMPREHENSIVE METABOLIC PANEL
ALT: 28 U/L (ref 0–44)
AST: 28 U/L (ref 15–41)
Albumin: 3.4 g/dL — ABNORMAL LOW (ref 3.5–5.0)
Alkaline Phosphatase: 223 U/L — ABNORMAL HIGH (ref 38–126)
Anion gap: 6 (ref 5–15)
BUN: 10 mg/dL (ref 8–23)
CO2: 26 mmol/L (ref 22–32)
Calcium: 8.8 mg/dL — ABNORMAL LOW (ref 8.9–10.3)
Chloride: 107 mmol/L (ref 98–111)
Creatinine, Ser: 1.04 mg/dL (ref 0.61–1.24)
GFR calc Af Amer: >60 mL/min (ref >60)
GFR calc non Af Amer: >60 mL/min (ref >60)
Glucose, Bld: 96 mg/dL (ref 70–99)
Potassium: 4 mmol/L (ref 3.5–5.1)
Sodium: 139 mmol/L (ref 135–145)
Total Bilirubin: 0.6 mg/dL (ref 0.3–1.2)
Total Protein: 6.5 g/dL (ref 6.5–8.1)

## 2018-12-09 LAB — TSH: TSH: 0.862 u[IU]/mL (ref 0.350–4.500)

## 2018-12-09 MED ORDER — SODIUM CHLORIDE 0.9 % IV SOLN
10.0000 mg/kg | Freq: Once | INTRAVENOUS | Status: AC
  Administered 2018-12-09: 700 mg via INTRAVENOUS
  Filled 2018-12-09: qty 10

## 2018-12-09 MED ORDER — HEPARIN SOD (PORK) LOCK FLUSH 100 UNIT/ML IV SOLN
INTRAVENOUS | Status: AC
  Filled 2018-12-09: qty 5

## 2018-12-09 MED ORDER — HEPARIN SOD (PORK) LOCK FLUSH 100 UNIT/ML IV SOLN
500.0000 [IU] | Freq: Once | INTRAVENOUS | Status: AC | PRN
  Administered 2018-12-09: 500 [IU]

## 2018-12-09 MED ORDER — SODIUM CHLORIDE 0.9 % IV SOLN
Freq: Once | INTRAVENOUS | Status: AC
  Administered 2018-12-09: 10:00:00 via INTRAVENOUS
  Filled 2018-12-09: qty 250

## 2018-12-09 MED ORDER — SODIUM CHLORIDE 0.9% FLUSH
10.0000 mL | Freq: Once | INTRAVENOUS | Status: AC
  Administered 2018-12-09: 10 mL via INTRAVENOUS
  Filled 2018-12-09: qty 10

## 2018-12-09 NOTE — Progress Notes (Signed)
Hematology/Oncology follow up note [facility] Telephone:([phone] Fax:(336) [phone]   Patient Care Team: Baxter Hire, MD as PCP - General (Internal Medicine) Sherren Mocha, MD as PCP - Cardiology (Cardiology) Telford Nab, RN as Registered Nurse  REFERRING PROVIDER: Dr.Fleming REASON FOR VISIT:  Follow-up for management of stage III non-small cell lung cancer   HISTORY OF PRESENTING ILLNESS:  Timothy Yoder is a  63 y.o.  male with PMH listed below who was referred to me for evaluation of lung mass.  Patient is a former smoker quit smoking 7 years ago.  40-pack-year. Patient chest lung cancer screening on 03/11/2018. CT scan showed 2.4 cm right lower lobe nodule with a new necrotic.  Subcarinal lymph node. PET scan showed hypermetabolic right lower pulmonary nodule, consistent with primary bronchogenic carcinoma.  Fluid density subcarinal structure demonstrate no significant hypermetabolism.  Although this could represent an incidental benign lesion such as a cyst given the interval development since 02/24/2017, necrotic adenopathy cannot be excluded and tissue sampling should be considered.  Patient was seen by Dr. Faith Rogue for initial evaluation on 03/13/2018.  Patient has not got PET scan at that time. Today patient was accompanied by his wife to the oncology clinic to discuss management plan.  And PET scan results. Patient reports chronic shortness of breath with moderate exertion.  Appetite is good denies any fever, chills, hemoptysis, weight loss.  # underwent biopsy via bronchoscopy.  Subcarinal lymph node biopsy positive for non-small cell lung cancer, favoring poorly differentiated adenocarcinoma. #Mediport was placed by Dr. Genevive Bi  # #Elevated alkaline phosphatase. Patient has been evaluated by gastroenterology.  Liver biopsy showed mild sinusoidal dilation and congestion.  Minimal changes suggestive of hepatic process. Iron stain demonstrate  minimal focal hepatocellular iron. I discussed with Dr.Wohl via secure chat about patient's pathology result.  No restriction of proceeding with immunotherapy.  Continue watch for the labs.   # Cancer Treatment 04/26/2018 -06/11/2018 Concurrent Chemotherapy [weekly Carbo and Taxol] with RT.  07/10/2018 Started on maintenance Durvalumab.  INTERVAL HISTORY Timothy Yoder is a 63 y.o. male who has above history reviewed by me today presents for follow up visit for management of stage III non-small cell lung cancer.  Patient has been on maintenance immunotherapy. Patient has tolerated immunotherapy well. Reports no new complaints today. Denies any pain. Fatigue is chronic.  No aggravating or improving factors.    Review of Systems  Constitutional: Positive for fatigue. Negative for appetite change, chills, fever and unexpected weight change.  HENT:   Negative for hearing loss and voice change.   Eyes: Negative for eye problems and icterus.  Respiratory: Negative for chest tightness, cough and shortness of breath.   Cardiovascular: Negative for chest pain and leg swelling.  Gastrointestinal: Negative for abdominal distention and abdominal pain.  Endocrine: Negative for hot flashes.  Genitourinary: Negative for difficulty urinating, dysuria and frequency.   Musculoskeletal: Negative for arthralgias.  Skin: Negative for itching and rash.  Neurological: Negative for light-headedness and numbness.  Hematological: Negative for adenopathy. Does not bruise/bleed easily.  Psychiatric/Behavioral: Negative for confusion.   MEDICAL HISTORY:  Past Medical History:  Diagnosis Date  . BPH (benign prostatic hyperplasia)   . CAD (coronary artery disease)    a. 04/2014 anterolateral STEMI/PCI: LM nl, LAD 48m(2.75x16 Promus DES, D1 50p, LCX nl, RI nl, OM1 100 small/chronic, RCA dominant, 20-350m7066m(FFR 0.90->Med Rx), RPDA 50, RPLA 70, EF 60%.  . CMarland KitchenPD (chronic obstructive pulmonary disease) ([facility]  Dr  Raul Del  . Former tobacco use   . Gastroesophageal reflux disease   . HTN (hypertension)   . Hyperlipidemia   . Hypokalemia   . Malignant neoplasm of lung ([place]) 04/01/2018  . Myocardial infarction ([place]) 04/2014   Dr Sherren Mocha  . Stented coronary artery     SURGICAL HISTORY: Past Surgical History:  Procedure Laterality Date  . CARDIAC CATHETERIZATION    . CHOLECYSTECTOMY N/A 02/15/2016   Procedure: LAPAROSCOPIC CHOLECYSTECTOMY WITH INTRAOPERATIVE CHOLANGIOGRAM;  Surgeon: Leonie Green, MD;  Location: [facility] ORS;  Service: General;  Laterality: N/A;  . COLONOSCOPY    . COLONOSCOPY WITH PROPOFOL N/A 08/02/2015   Procedure: COLONOSCOPY WITH PROPOFOL;  Surgeon: Manya Silvas, MD;  Location: [facility] ENDOSCOPY;  Service: Endoscopy;  Laterality: N/A;  . CORONARY ANGIOPLASTY  04/2014   STENT  . ENDOBRONCHIAL ULTRASOUND N/A 03/26/2018   Procedure: ENDOBRONCHIAL ULTRASOUND;  Surgeon: Flora Lipps, MD;  Location: [facility] ORS;  Service: Cardiopulmonary;  Laterality: N/A;  . ESOPHAGOGASTRODUODENOSCOPY (EGD) WITH PROPOFOL N/A 08/02/2015   Procedure: ESOPHAGOGASTRODUODENOSCOPY (EGD) WITH PROPOFOL;  Surgeon: Manya Silvas, MD;  Location: [facility] ENDOSCOPY;  Service: Endoscopy;  Laterality: N/A;  . FLEXIBLE BRONCHOSCOPY N/A 03/26/2018   Procedure: FLEXIBLE BRONCHOSCOPY;  Surgeon: Flora Lipps, MD;  Location: [facility] ORS;  Service: Cardiopulmonary;  Laterality: N/A;  . KNEE ARTHROSCOPY Left   . LEFT HEART CATHETERIZATION WITH CORONARY ANGIOGRAM N/A 05/03/2014   Procedure: LEFT HEART CATHETERIZATION WITH CORONARY ANGIOGRAM;  Surgeon: Blane Ohara, MD;  Location: [facility] CATH LAB;  Service: Cardiovascular;  Laterality: N/A;  . PERCUTANEOUS CORONARY STENT INTERVENTION (PCI-S) N/A 05/05/2014   Procedure: PERCUTANEOUS CORONARY STENT INTERVENTION (PCI-S);  Surgeon: Peter M Martinique, MD;  Location: [facility] CATH LAB;  Service: Cardiovascular;  Laterality: N/A;  . PORTACATH PLACEMENT Left 04/04/2018   Procedure:  INSERTION PORT-A-CATH;  Surgeon: [name], MD;  Location: [facility] ORS;  Service: General;  Laterality: Left;    SOCIAL HISTORY: Social History   Socioeconomic History  . Marital status: Married    Spouse name: Not on file  . Number of children: Not on file  . Years of education: Not on file  . Highest education level: Not on file  Occupational History  . Not on file  Social Needs  . Financial resource strain: Not on file  . Food insecurity    Worry: Not on file    Inability: Not on file  . Transportation needs    Medical: Not on file    Non-medical: Not on file  Tobacco Use  . Smoking status: Former Smoker    Packs/day: 1.00    Years: 40.00    Pack years: 40.00    Types: Cigarettes    Quit date: 02/14/2011    Years since quitting: 7.8  . Smokeless tobacco: Never Used  Substance and Sexual Activity  . Alcohol use: No  . Drug use: No  . Sexual activity: Not on file  Lifestyle  . Physical activity    Days per week: Not on file    Minutes per session: Not on file  . Stress: Not on file  Relationships  . Social Herbalist on phone: Not on file    Gets together: Not on file    Attends religious service: Not on file    Active member of club or organization: Not on file    Attends meetings of clubs or organizations: Not on file    Relationship status: Not on file  . Intimate  partner violence    Fear of current or ex partner: Not on file    Emotionally abused: Not on file    Physically abused: Not on file    Forced sexual activity: Not on file  Other Topics Concern  . Not on file  Social History Narrative   The patient is married. He lives in [place]. He works full-time. He quit smoking in 2013.    FAMILY HISTORY: Family History  Problem Relation Age of Onset  . CAD Mother 70       Died of an MI  . CAD Father 90       Died of a massive MI  . COPD Sister   . Lung cancer Brother   . Bone cancer Paternal Uncle   . Colon cancer Neg Hx   . Breast  cancer Neg Hx     ALLERGIES:  has No Known Allergies.  MEDICATIONS:  Current Outpatient Medications  Medication Sig Dispense Refill  . acetaminophen (TYLENOL) 500 MG tablet Take 1,000 mg by mouth every 6 (six) hours as needed (for pain.).    Marland Kitchen albuterol (PROAIR HFA) 108 (90 Base) MCG/ACT inhaler Inhale 2 puffs into the lungs every 6 (six) hours as needed for wheezing or shortness of breath.     Marland Kitchen aspirin EC 81 MG tablet Take 81 mg by mouth daily.    Marland Kitchen atorvastatin (LIPITOR) 80 MG tablet Take 1 tablet (80 mg total) by mouth daily at 6 PM. 90 tablet 3  . carvedilol (COREG) 6.25 MG tablet Take 1 tablet (6.25 mg total) by mouth 2 (two) times daily. 180 tablet 3  . colestipol (COLESTID) 1 g tablet Take 2 g by mouth as needed (for stomach issues).     . folic acid (FOLVITE) 1 MG tablet Take 1 tablet (1 mg total) by mouth daily. 90 tablet 1  . lidocaine-prilocaine (EMLA) cream APPLY TO AFFECTED AREA ONCE AS DIRECTED    . lisinopril (ZESTRIL) 10 MG tablet Take 1 tablet (10 mg total) by mouth daily. 90 tablet 3  . omeprazole (PRILOSEC) 40 MG capsule TAKE 1 CAPSULE BY MOUTH TWICE A DAY 180 capsule 1  . potassium chloride SA (KLOR-CON M20) 20 MEQ tablet Take 1 tablet (20 mEq total) by mouth daily. 90 tablet 3  . sucralfate (CARAFATE) 1 g tablet Take 1 tablet (1 g total) by mouth 3 (three) times daily before meals. Dissolve in warm water, swish and swallow 90 tablet 6  . tamsulosin (FLOMAX) 0.4 MG CAPS capsule Take 0.4 mg by mouth daily after supper.    . traZODone (DESYREL) 50 MG tablet TAKE 1 TABLET BY MOUTH EVERYDAY AT BEDTIME 90 tablet 0  . umeclidinium-vilanterol (ANORO ELLIPTA) 62.5-25 MCG/INH AEPB Inhale 1 puff into the lungs every morning.    . nitroGLYCERIN (NITROSTAT) 0.4 MG SL tablet Place 1 tablet (0.4 mg total) under the tongue every 5 (five) minutes as needed for chest pain. X 3 doses (Patient not taking: Reported on 12/09/2018) 25 tablet 3  . ondansetron (ZOFRAN) 8 MG tablet PLEASE SEE  ATTACHED FOR DETAILED DIRECTIONS    . oxyCODONE-acetaminophen (PERCOCET/ROXICET) 5-325 MG tablet Take by mouth.    . vitamin B-12 (CYANOCOBALAMIN) 1000 MCG tablet Take 1 tablet (1,000 mcg total) by mouth daily. 90 tablet 1   No current facility-administered medications for this visit.      PHYSICAL EXAMINATION: ECOG PERFORMANCE STATUS: 0 - Asymptomatic Vitals:   12/09/18 0924  BP: 133/80  Pulse: 67  Temp: (!) 97.2  F (36.2 C)   Filed Weights   12/09/18 0924  Weight: 145 lb 12.8 oz (66.1 kg)    Physical Exam Constitutional:      General: He is not in acute distress. HENT:     Head: Normocephalic and atraumatic.  Eyes:     General: No scleral icterus.    Pupils: Pupils are equal, round, and reactive to light.  Neck:     Musculoskeletal: Normal range of motion and neck supple.  Cardiovascular:     Rate and Rhythm: Normal rate and regular rhythm.     Heart sounds: Normal heart sounds.  Pulmonary:     Effort: Pulmonary effort is normal. No respiratory distress.     Breath sounds: No wheezing.  Abdominal:     General: Bowel sounds are normal. There is no distension.     Palpations: Abdomen is soft. There is no mass.     Tenderness: There is no abdominal tenderness.  Musculoskeletal: Normal range of motion.        General: No deformity.  Skin:    General: Skin is warm and dry.     Findings: No erythema or rash.  Neurological:     Mental Status: He is alert and oriented to person, place, and time.     Cranial Nerves: No cranial nerve deficit.     Coordination: Coordination normal.  Psychiatric:        Behavior: Behavior normal.        Thought Content: Thought content normal.      LABORATORY DATA:  I have reviewed the data as listed Lab Results  Component Value Date   WBC 6.5 12/09/2018   HGB 12.4 (L) 12/09/2018   HCT 37.8 (L) 12/09/2018   MCV 90.0 12/09/2018   PLT 197 12/09/2018   Recent Labs    10/28/18 0844 11/21/18 0850 12/09/18 0849  NA 136 137 139   K 3.9 4.0 4.0  CL 103 105 107  CO2 _0 GLUCOSE 95 95 96  BUN _1 CREATININE 0.99 0.90 1.04  CALCIUM 8.6* 8.7* 8.8*  GFRNONAA >60 >60 >60  GFRAA >60 >60 >60  PROT 7.0 6.6 6.5  ALBUMIN 3.5 3.3* 3.4*  AST 34 31 28  ALT 33 30 28  ALKPHOS 284* 233* 223*  BILITOT 0.7 0.6 0.6   Iron/TIBC/Ferritin/ %Sat    Component Value Date/Time   IRON 41 (L) 11/21/2018 0856   TIBC 260 11/21/2018 0856   FERRITIN 150 11/21/2018 0856   IRONPCTSAT 16 (L) 11/21/2018 0856    RADIOGRAPHIC STUDIES: I have personally reviewed the radiological images as listed and agreed with the findings in the report.. 03/11/2018 CT chest lung screen 1. New 2.4 cm right lower lobe nodule with a new necrotic appearing subcarinal lymph node. Lung-RADS 4B, suspicious. Additional imaging evaluation or consultation with Pulmonology or Thoracic Surgery recommended. These results will be called to the ordering clinician or representative by the Radiologist Assistant, and communication documented in the PACS or zVision Dashboard. 2. Aortic atherosclerosis (ICD10-170.0). Coronary arterycalcification.3.  Emphysema (ICD10-J43.9). 03/13/2018 PET scan # Hypermetabolic right lower lobe pulmonary nodule, consistent with primary bronchogenic carcinoma. Fluid density subcarinal structure demonstrates no significant hypermetabolism. Although this could represent an incidental benign lesion such as a bronchogenic cyst, given interval development since 02/24/2017, necrotic adenopathy cannot be excluded and tissue sampling should be considered.  03/26/2018 DIAGNOSIS:  A. SUBCARINA, CYSTIC MASS; FNA:  - NON-SMALL CELL CARCINOMA CONSISTENT WITH POORLY DIFFERENTIATED  ADENOCARCINOMA.  MRI brain 04/01/2018  Normal MRI head with and without contrast for age.  No intracranial metastasis.  07/05/2018 CT chest w contrast #Significant partial treatment response.  Medial right lower lobe pulmonary nodule is significantly decreased.   Subcarinal adenopathy has resolved. No new or progressive metastatic disease in the chest.  Aortic sclerosis and emphysema.   ASSESSMENT & PLAN:  1. Malignant neoplasm of lower lobe of right lung ([place])   2. Transaminitis   3. Encounter for antineoplastic immunotherapy   4. Other fatigue   Cancer Staging Malignant neoplasm of lower lobe of right lung [facility]) Staging form: Lung, AJCC 8th Edition - Clinical stage from 05/13/2018: Stage IIIA (cT1c, cN2, cM0) - Signed by Earlie Server, MD on 05/13/2018  #Stage IIIA lung adenocarcinoma Labs reviewed and discussed with patient. Continue immunotherapy. Acceptable.    #Elevated alkaline phosphatase, labs were reviewed and discussed with patient Levels are stable slightly improved. Continue to monitor per GI recommendation.  Fatigue, chronic, stable.  TSH has been monitored and the level has been stable. Follow-up in 2 weeks for evaluation of next cycle of immunotherapy.   Earlie Server, MD, PhD 12/09/18

## 2018-12-16 NOTE — Telephone Encounter (Signed)
Scheduled patient for EKG 8/26 on Dr. Antionette Char DOD day. He will bring his BP cuff to compare to manual reading. He was grateful for call and agrees with treatment plan.

## 2018-12-18 ENCOUNTER — Other Ambulatory Visit: Payer: Self-pay

## 2018-12-18 ENCOUNTER — Ambulatory Visit (INDEPENDENT_AMBULATORY_CARE_PROVIDER_SITE_OTHER): Payer: BC Managed Care – PPO | Admitting: *Deleted

## 2018-12-18 VITALS — BP 120/79 | HR 74 | Ht 68.0 in | Wt 149.0 lb

## 2018-12-18 DIAGNOSIS — R000 Tachycardia, unspecified: Secondary | ICD-10-CM

## 2018-12-18 NOTE — Progress Notes (Signed)
Pt came in today for fast heart rate and dizziness Ortho statics done Also compared pt's B/P cuff 132/86 pt cuff  Dr  Burt Knack reviewed information and pt needs to discontinue Lisinopril and monitor B/P for a couple of weeks and call with readings./cy

## 2018-12-18 NOTE — Patient Instructions (Signed)
Your physician has recommended you make the following change in your medication: DISCONTINUE LISINIOPRIL   MONITOR B/P AND HR FOR COUPLE WEEKS  AND CALL WITH READINGS

## 2018-12-23 ENCOUNTER — Inpatient Hospital Stay: Payer: BC Managed Care – PPO

## 2018-12-23 ENCOUNTER — Inpatient Hospital Stay (HOSPITAL_BASED_OUTPATIENT_CLINIC_OR_DEPARTMENT_OTHER): Payer: BC Managed Care – PPO | Admitting: Oncology

## 2018-12-23 ENCOUNTER — Other Ambulatory Visit: Payer: Self-pay

## 2018-12-23 ENCOUNTER — Encounter: Payer: Self-pay | Admitting: Oncology

## 2018-12-23 VITALS — BP 111/75 | HR 67 | Temp 97.7°F | Resp 18 | Wt 144.7 lb

## 2018-12-23 DIAGNOSIS — R740 Nonspecific elevation of levels of transaminase and lactic acid dehydrogenase [LDH]: Secondary | ICD-10-CM

## 2018-12-23 DIAGNOSIS — C3431 Malignant neoplasm of lower lobe, right bronchus or lung: Secondary | ICD-10-CM | POA: Diagnosis not present

## 2018-12-23 DIAGNOSIS — R7401 Elevation of levels of liver transaminase levels: Secondary | ICD-10-CM

## 2018-12-23 DIAGNOSIS — Z5112 Encounter for antineoplastic immunotherapy: Secondary | ICD-10-CM

## 2018-12-23 DIAGNOSIS — R5383 Other fatigue: Secondary | ICD-10-CM | POA: Diagnosis not present

## 2018-12-23 LAB — CBC WITH DIFFERENTIAL/PLATELET
Abs Immature Granulocytes: 0.05 10*3/uL (ref 0.00–0.07)
Basophils Absolute: 0.1 10*3/uL (ref 0.0–0.1)
Basophils Relative: 1 %
Eosinophils Absolute: 0.1 10*3/uL (ref 0.0–0.5)
Eosinophils Relative: 1 %
HCT: 40 % (ref 39.0–52.0)
Hemoglobin: 13.3 g/dL (ref 13.0–17.0)
Immature Granulocytes: 1 %
Lymphocytes Relative: 13 %
Lymphs Abs: 0.9 10*3/uL (ref 0.7–4.0)
MCH: 29.5 pg (ref 26.0–34.0)
MCHC: 33.3 g/dL (ref 30.0–36.0)
MCV: 88.7 fL (ref 80.0–100.0)
Monocytes Absolute: 0.8 10*3/uL (ref 0.1–1.0)
Monocytes Relative: 11 %
Neutro Abs: 5.1 10*3/uL (ref 1.7–7.7)
Neutrophils Relative %: 73 %
Platelets: 187 10*3/uL (ref 150–400)
RBC: 4.51 MIL/uL (ref 4.22–5.81)
RDW: 15.1 % (ref 11.5–15.5)
WBC: 7 10*3/uL (ref 4.0–10.5)
nRBC: 0 % (ref 0.0–0.2)

## 2018-12-23 LAB — COMPREHENSIVE METABOLIC PANEL
ALT: 27 U/L (ref 0–44)
AST: 28 U/L (ref 15–41)
Albumin: 3.6 g/dL (ref 3.5–5.0)
Alkaline Phosphatase: 216 U/L — ABNORMAL HIGH (ref 38–126)
Anion gap: 5 (ref 5–15)
BUN: 11 mg/dL (ref 8–23)
CO2: 27 mmol/L (ref 22–32)
Calcium: 8.9 mg/dL (ref 8.9–10.3)
Chloride: 104 mmol/L (ref 98–111)
Creatinine, Ser: 0.94 mg/dL (ref 0.61–1.24)
GFR calc Af Amer: >60 mL/min (ref >60)
GFR calc non Af Amer: >60 mL/min (ref >60)
Glucose, Bld: 89 mg/dL (ref 70–99)
Potassium: 4.2 mmol/L (ref 3.5–5.1)
Sodium: 136 mmol/L (ref 135–145)
Total Bilirubin: 0.8 mg/dL (ref 0.3–1.2)
Total Protein: 7.1 g/dL (ref 6.5–8.1)

## 2018-12-23 MED ORDER — SODIUM CHLORIDE 0.9 % IV SOLN
Freq: Once | INTRAVENOUS | Status: AC
  Administered 2018-12-23: 10:00:00 via INTRAVENOUS
  Filled 2018-12-23: qty 250

## 2018-12-23 MED ORDER — SODIUM CHLORIDE 0.9 % IV SOLN
10.0000 mg/kg | Freq: Once | INTRAVENOUS | Status: AC
  Administered 2018-12-23: 11:00:00 700 mg via INTRAVENOUS
  Filled 2018-12-23: qty 4

## 2018-12-23 MED ORDER — HEPARIN SOD (PORK) LOCK FLUSH 100 UNIT/ML IV SOLN
500.0000 [IU] | Freq: Once | INTRAVENOUS | Status: AC | PRN
  Administered 2018-12-23: 500 [IU]
  Filled 2018-12-23: qty 5

## 2018-12-23 NOTE — Progress Notes (Signed)
Patient does not offer any problems today.  

## 2018-12-23 NOTE — Progress Notes (Signed)
Hematology/Oncology follow up note Timothy Yoder [phone] [phone]   Patient Care Team: Baxter Hire, MD as PCP - General (Internal Medicine) Sherren Mocha, MD as PCP - Cardiology (Cardiology) Telford Nab, RN as Registered Nurse  REFERRING PROVIDER: Dr.Fleming REASON FOR VISIT:  Follow-up for management of stage III non-small cell lung cancer   HISTORY OF PRESENTING ILLNESS:  Timothy Yoder is a  63 y.o.  male with PMH listed below who was referred to me for evaluation of lung mass.  Patient is a former smoker quit smoking 7 years ago.  40-pack-year. Patient chest lung cancer screening on 03/11/2018. CT scan showed 2.4 cm right lower lobe nodule with a new necrotic.  Subcarinal lymph node. PET scan showed hypermetabolic right lower pulmonary nodule, consistent with primary bronchogenic carcinoma.  Fluid density subcarinal structure demonstrate no significant hypermetabolism.  Although this could represent an incidental benign lesion such as a cyst given the interval development since 02/24/2017, necrotic adenopathy cannot be excluded and tissue sampling should be considered.  Patient was seen by Dr. Faith Rogue for initial evaluation on 03/13/2018.  Patient has not got PET scan at that time. Today patient was accompanied by his wife to the oncology clinic to discuss management plan.  And PET scan results. Patient reports chronic shortness of breath with moderate exertion.  Appetite is good denies any fever, chills, hemoptysis, weight loss.  # underwent biopsy via bronchoscopy.  Subcarinal lymph node biopsy positive for non-small cell lung cancer, favoring poorly differentiated adenocarcinoma. #Mediport was placed by Dr. Genevive Bi  # #Elevated alkaline phosphatase. Patient has been evaluated by gastroenterology.  Liver biopsy showed mild sinusoidal dilation and congestion.  Minimal changes suggestive of hepatic process. Iron stain demonstrate  minimal focal hepatocellular iron. I discussed with Dr.Wohl via secure chat about patient's pathology result.  No restriction of proceeding with immunotherapy.  Continue watch for the labs.   # Cancer Treatment 04/26/2018 -06/11/2018 Concurrent Chemotherapy [weekly Carbo and Taxol] with RT.  07/10/2018 Started on maintenance Durvalumab.  INTERVAL HISTORY [name] is a 63 y.o. male who has above history reviewed by me today presents for follow up visit for management of stage III non-small cell lung cancer.  Patient has been on maintenance immunotherapy. Patient reports feeling well.  Tolerating immunotherapy. No new complaints. Denies any pain. Fatigue is chronic.  No aggravating or improving factors     Review of Systems  Constitutional: Positive for fatigue. Negative for appetite change, chills, fever and unexpected weight change.  HENT:   Negative for hearing loss and voice change.   Eyes: Negative for eye problems and icterus.  Respiratory: Negative for chest tightness, cough and shortness of breath.   Cardiovascular: Negative for chest pain and leg swelling.  Gastrointestinal: Negative for abdominal distention and abdominal pain.  Endocrine: Negative for hot flashes.  Genitourinary: Negative for difficulty urinating, dysuria and frequency.   Musculoskeletal: Negative for arthralgias.  Skin: Negative for itching and rash.  Neurological: Negative for light-headedness and numbness.  Hematological: Negative for adenopathy. Does not bruise/bleed easily.  Psychiatric/Behavioral: Negative for confusion.   MEDICAL HISTORY:  Past Medical History:  Diagnosis Date  . BPH (benign prostatic hyperplasia)   . CAD (coronary artery disease)    a. 04/2014 anterolateral STEMI/PCI: LM nl, LAD 9m(2.75x16 Promus DES, D1 50p, LCX nl, RI nl, OM1 100 small/chronic, RCA dominant, [id number](FFR 0.90->Med Rx), RPDA 50, RPLA 70, EF 60%.  . CMarland KitchenPD (chronic obstructive pulmonary disease) [place]  Dr  Raul Del  . Former tobacco use   . Gastroesophageal reflux disease   . HTN (hypertension)   . Hyperlipidemia   . Hypokalemia   . Malignant neoplasm of lung ([place]) 04/01/2018  . Myocardial infarction ([place]) 04/2014   Dr Sherren Mocha  . Stented coronary artery     SURGICAL HISTORY: Past Surgical History:  Procedure Laterality Date  . CARDIAC CATHETERIZATION    . CHOLECYSTECTOMY N/A 02/15/2016   Procedure: LAPAROSCOPIC CHOLECYSTECTOMY WITH INTRAOPERATIVE CHOLANGIOGRAM;  Surgeon: Leonie Green, MD;  Location: [facility] ORS;  Service: General;  Laterality: N/A;  . COLONOSCOPY    . COLONOSCOPY WITH PROPOFOL N/A 08/02/2015   Procedure: COLONOSCOPY WITH PROPOFOL;  Surgeon: Manya Silvas, MD;  Location: [facility] ENDOSCOPY;  Service: Endoscopy;  Laterality: N/A;  . CORONARY ANGIOPLASTY  04/2014   STENT  . ENDOBRONCHIAL ULTRASOUND N/A 03/26/2018   Procedure: ENDOBRONCHIAL ULTRASOUND;  Surgeon: Flora Lipps, MD;  Location: [facility] ORS;  Service: Cardiopulmonary;  Laterality: N/A;  . ESOPHAGOGASTRODUODENOSCOPY (EGD) WITH PROPOFOL N/A 08/02/2015   Procedure: ESOPHAGOGASTRODUODENOSCOPY (EGD) WITH PROPOFOL;  Surgeon: Manya Silvas, MD;  Location: [facility] ENDOSCOPY;  Service: Endoscopy;  Laterality: N/A;  . FLEXIBLE BRONCHOSCOPY N/A 03/26/2018   Procedure: FLEXIBLE BRONCHOSCOPY;  Surgeon: Flora Lipps, MD;  Location: [facility] ORS;  Service: Cardiopulmonary;  Laterality: N/A;  . KNEE ARTHROSCOPY Left   . LEFT HEART CATHETERIZATION WITH CORONARY ANGIOGRAM N/A 05/03/2014   Procedure: LEFT HEART CATHETERIZATION WITH CORONARY ANGIOGRAM;  Surgeon: Blane Ohara, MD;  Location: [facility] CATH LAB;  Service: Cardiovascular;  Laterality: N/A;  . PERCUTANEOUS CORONARY STENT INTERVENTION (PCI-S) N/A 05/05/2014   Procedure: PERCUTANEOUS CORONARY STENT INTERVENTION (PCI-S);  Surgeon: Peter M Martinique, MD;  Location: [facility] CATH LAB;  Service: Cardiovascular;  Laterality: N/A;  . PORTACATH PLACEMENT Left 04/04/2018   Procedure:  INSERTION PORT-A-CATH;  Surgeon: [name], MD;  Location: [facility] ORS;  Service: General;  Laterality: Left;    SOCIAL HISTORY: Social History   Socioeconomic History  . Marital status: Married    Spouse name: Not on file  . Number of children: Not on file  . Years of education: Not on file  . Highest education level: Not on file  Occupational History  . Not on file  Social Needs  . Financial resource strain: Not on file  . Food insecurity    Worry: Not on file    Inability: Not on file  . Transportation needs    Medical: Not on file    Non-medical: Not on file  Tobacco Use  . Smoking status: Former Smoker    Packs/day: 1.00    Years: 40.00    Pack years: 40.00    Types: Cigarettes    Quit date: 02/14/2011    Years since quitting: 7.8  . Smokeless tobacco: Never Used  Substance and Sexual Activity  . Alcohol use: No  . Drug use: No  . Sexual activity: Not on file  Lifestyle  . Physical activity    Days per week: Not on file    Minutes per session: Not on file  . Stress: Not on file  Relationships  . Social Herbalist on phone: Not on file    Gets together: Not on file    Attends religious service: Not on file    Active member of club or organization: Not on file    Attends meetings of clubs or organizations: Not on file    Relationship status: Not on file  . Intimate  partner violence    Fear of current or ex partner: Not on file    Emotionally abused: Not on file    Physically abused: Not on file    Forced sexual activity: Not on file  Other Topics Concern  . Not on file  Social History Narrative   The patient is married. He lives in [place]. He works full-time. He quit smoking in 2013.    FAMILY HISTORY: Family History  Problem Relation Age of Onset  . CAD Mother 79       Died of an MI  . CAD Father 19       Died of a massive MI  . COPD Sister   . Lung cancer Brother   . Bone cancer Paternal Uncle   . Colon cancer Neg Hx   . Breast  cancer Neg Hx     ALLERGIES:  has No Known Allergies.  MEDICATIONS:  Current Outpatient Medications  Medication Sig Dispense Refill  . acetaminophen (TYLENOL) 500 MG tablet Take 1,000 mg by mouth every 6 (six) hours as needed (for pain.).    Marland Kitchen albuterol (PROAIR HFA) 108 (90 Base) MCG/ACT inhaler Inhale 2 puffs into the lungs every 6 (six) hours as needed for wheezing or shortness of breath.     Marland Kitchen aspirin EC 81 MG tablet Take 81 mg by mouth daily.    Marland Kitchen atorvastatin (LIPITOR) 80 MG tablet Take 1 tablet (80 mg total) by mouth daily at 6 PM. 90 tablet 3  . carvedilol (COREG) 6.25 MG tablet Take 1 tablet (6.25 mg total) by mouth 2 (two) times daily. 180 tablet 3  . colestipol (COLESTID) 1 g tablet Take 2 g by mouth as needed (for stomach issues).     . folic acid (FOLVITE) 1 MG tablet Take 1 tablet (1 mg total) by mouth daily. 90 tablet 1  . lidocaine-prilocaine (EMLA) cream APPLY TO AFFECTED AREA ONCE AS DIRECTED    . nitroGLYCERIN (NITROSTAT) 0.4 MG SL tablet Place 1 tablet (0.4 mg total) under the tongue every 5 (five) minutes as needed for chest pain. X 3 doses 25 tablet 3  . omeprazole (PRILOSEC) 40 MG capsule TAKE 1 CAPSULE BY MOUTH TWICE A DAY 180 capsule 1  . ondansetron (ZOFRAN) 8 MG tablet PLEASE SEE ATTACHED FOR DETAILED DIRECTIONS    . oxyCODONE-acetaminophen (PERCOCET/ROXICET) 5-325 MG tablet Take by mouth.    . potassium chloride SA (KLOR-CON M20) 20 MEQ tablet Take 1 tablet (20 mEq total) by mouth daily. 90 tablet 3  . sucralfate (CARAFATE) 1 g tablet Take 1 tablet (1 g total) by mouth 3 (three) times daily before meals. Dissolve in warm water, swish and swallow 90 tablet 6  . tamsulosin (FLOMAX) 0.4 MG CAPS capsule Take 0.4 mg by mouth daily after supper.    . traZODone (DESYREL) 50 MG tablet TAKE 1 TABLET BY MOUTH EVERYDAY AT BEDTIME 90 tablet 0  . umeclidinium-vilanterol (ANORO ELLIPTA) 62.5-25 MCG/INH AEPB Inhale 1 puff into the lungs every morning.    . vitamin B-12  (CYANOCOBALAMIN) 1000 MCG tablet Take 1 tablet (1,000 mcg total) by mouth daily. 90 tablet 1  . lisinopril (ZESTRIL) 10 MG tablet Take 1 tablet (10 mg total) by mouth daily. (Patient not taking: Reported on 12/23/2018) 90 tablet 3   No current facility-administered medications for this visit.      PHYSICAL EXAMINATION: ECOG PERFORMANCE STATUS: 0 - Asymptomatic Vitals:   12/23/18 0851  BP: 111/75  Pulse: 67  Resp: 18  Temp: 97.7 F (36.5 C)   Filed Weights   12/23/18 0851  Weight: 144 lb 11.2 oz (65.6 kg)    Physical Exam Constitutional:      General: He is not in acute distress. HENT:     Head: Normocephalic and atraumatic.  Eyes:     General: No scleral icterus.    Pupils: Pupils are equal, round, and reactive to light.  Neck:     Musculoskeletal: Normal range of motion and neck supple.  Cardiovascular:     Rate and Rhythm: Normal rate and regular rhythm.     Heart sounds: Normal heart sounds.  Pulmonary:     Effort: Pulmonary effort is normal. No respiratory distress.     Breath sounds: No wheezing.  Abdominal:     General: Bowel sounds are normal. There is no distension.     Palpations: Abdomen is soft. There is no mass.     Tenderness: There is no abdominal tenderness.  Musculoskeletal: Normal range of motion.        General: No deformity.  Skin:    General: Skin is warm and dry.     Findings: No erythema or rash.  Neurological:     Mental Status: He is alert and oriented to person, place, and time.     Cranial Nerves: No cranial nerve deficit.     Coordination: Coordination normal.  Psychiatric:        Behavior: Behavior normal.        Thought Content: Thought content normal.      LABORATORY DATA:  I have reviewed the data as listed Lab Results  Component Value Date   WBC 7.0 12/23/2018   HGB 13.3 12/23/2018   HCT 40.0 12/23/2018   MCV 88.7 12/23/2018   PLT 187 12/23/2018   Recent Labs    11/21/18 0850 12/09/18 0849 12/23/18 0834  NA 137  139 136  K 4.0 4.0 4.2  CL 105 107 104  CO2 _0 GLUCOSE 95 96 89  BUN _1 CREATININE 0.90 1.04 0.94  CALCIUM 8.7* 8.8* 8.9  GFRNONAA >60 >60 >60  GFRAA >60 >60 >60  PROT 6.6 6.5 7.1  ALBUMIN 3.3* 3.4* 3.6  AST _2 ALT _3 ALKPHOS 233* 223* 216*  BILITOT 0.6 0.6 0.8   Iron/TIBC/Ferritin/ %Sat    Component Value Date/Time   IRON 41 (L) 11/21/2018 0856   TIBC 260 11/21/2018 0856   FERRITIN 150 11/21/2018 0856   IRONPCTSAT 16 (L) 11/21/2018 0856    RADIOGRAPHIC STUDIES: I have personally reviewed the radiological images as listed and agreed with the findings in the report.. 03/11/2018 CT chest lung screen 1. New 2.4 cm right lower lobe nodule with a new necrotic appearing subcarinal lymph node. Lung-RADS 4B, suspicious. Additional imaging evaluation or consultation with Pulmonology or Thoracic Surgery recommended. These results will be called to the ordering clinician or representative by the Radiologist Assistant, and communication documented in the PACS or zVision Dashboard. 2. Aortic atherosclerosis (ICD10-170.0). Coronary arterycalcification.3.  Emphysema (ICD10-J43.9). 03/13/2018 PET scan # Hypermetabolic right lower lobe pulmonary nodule, consistent with primary bronchogenic carcinoma. Fluid density subcarinal structure demonstrates no significant hypermetabolism. Although this could represent an incidental benign lesion such as a bronchogenic cyst, given interval development since 02/24/2017, necrotic adenopathy cannot be excluded and tissue sampling should be considered.  03/26/2018 DIAGNOSIS:  A. SUBCARINA, CYSTIC MASS; FNA:  - NON-SMALL CELL CARCINOMA CONSISTENT WITH POORLY DIFFERENTIATED  ADENOCARCINOMA.  MRI brain 04/01/2018  Normal MRI head with and without contrast for age.  No intracranial metastasis.  07/05/2018 CT chest w contrast #Significant partial treatment response.  Medial right lower lobe pulmonary nodule is significantly decreased.   Subcarinal adenopathy has resolved. No new or progressive metastatic disease in the chest.  Aortic sclerosis and emphysema.   ASSESSMENT & PLAN:  1. Malignant neoplasm of lower lobe of right lung ([place])   2. Encounter for antineoplastic immunotherapy   3. Transaminitis   4. Other fatigue   Cancer Staging Malignant neoplasm of lower lobe of right lung [facility]) Staging form: Lung, AJCC 8th Edition - Clinical stage from 05/13/2018: Stage IIIA (cT1c, cN2, cM0) - Signed by Earlie Server, MD on 05/13/2018  #Stage IIIA lung adenocarcinoma Labs reviewed and discussed with patient. Counts acceptable proceed with durvalumab.  #Elevated alkaline phosphatase, labs were reviewed and discussed with patient Alkaline phosphatase gradually trending down.  Continue to monitor per GI recommendation.  #Fatigue, chronic, at baseline.  TSH has been monitored and the level has been stable. Follow-up in 2 weeks for evaluation of next cycle of immunotherapy.   Earlie Server, MD, PhD 12/23/18

## 2018-12-28 ENCOUNTER — Other Ambulatory Visit: Payer: Self-pay | Admitting: Oncology

## 2018-12-31 MED ORDER — ANORO ELLIPTA 62.5-25 MCG/INH IN AEPB: 1.0000 | INHALATION_SPRAY | Freq: Every day | RESPIRATORY_TRACT | 0 refills | Status: AC

## 2018-12-31 MED ORDER — TRAZODONE HCL 50 MG PO TABS: 50.0000 mg | ORAL_TABLET | Freq: Every day | ORAL | 0 refills | Status: DC

## 2019-01-01 ENCOUNTER — Other Ambulatory Visit: Payer: Self-pay

## 2019-01-01 MED ORDER — NITROGLYCERIN 0.4 MG SL SUBL: 0.4000 mg | SUBLINGUAL_TABLET | SUBLINGUAL | 3 refills | Status: DC | PRN

## 2019-01-12 ENCOUNTER — Encounter: Payer: Self-pay | Admitting: Oncology

## 2019-01-12 ENCOUNTER — Other Ambulatory Visit: Payer: Self-pay

## 2019-01-12 NOTE — Progress Notes (Signed)
Patient stated that he had been doing well despite the fact that his appetite changes sometimes. However, patient stated that he had not lost weight.

## 2019-01-13 ENCOUNTER — Inpatient Hospital Stay: Payer: BC Managed Care – PPO | Attending: Oncology

## 2019-01-13 ENCOUNTER — Inpatient Hospital Stay: Payer: BC Managed Care – PPO

## 2019-01-13 ENCOUNTER — Inpatient Hospital Stay (HOSPITAL_BASED_OUTPATIENT_CLINIC_OR_DEPARTMENT_OTHER): Payer: BC Managed Care – PPO | Admitting: Oncology

## 2019-01-13 ENCOUNTER — Other Ambulatory Visit: Payer: Self-pay

## 2019-01-13 VITALS — BP 150/88 | HR 74 | Temp 97.1°F | Ht 68.0 in | Wt 151.0 lb

## 2019-01-13 DIAGNOSIS — Z87891 Personal history of nicotine dependence: Secondary | ICD-10-CM | POA: Insufficient documentation

## 2019-01-13 DIAGNOSIS — R5383 Other fatigue: Secondary | ICD-10-CM

## 2019-01-13 DIAGNOSIS — J449 Chronic obstructive pulmonary disease, unspecified: Secondary | ICD-10-CM | POA: Diagnosis not present

## 2019-01-13 DIAGNOSIS — Z5112 Encounter for antineoplastic immunotherapy: Secondary | ICD-10-CM | POA: Diagnosis not present

## 2019-01-13 DIAGNOSIS — I10 Essential (primary) hypertension: Secondary | ICD-10-CM | POA: Diagnosis not present

## 2019-01-13 DIAGNOSIS — C3431 Malignant neoplasm of lower lobe, right bronchus or lung: Secondary | ICD-10-CM | POA: Insufficient documentation

## 2019-01-13 DIAGNOSIS — R748 Abnormal levels of other serum enzymes: Secondary | ICD-10-CM

## 2019-01-13 DIAGNOSIS — Z801 Family history of malignant neoplasm of trachea, bronchus and lung: Secondary | ICD-10-CM | POA: Diagnosis not present

## 2019-01-13 DIAGNOSIS — N400 Enlarged prostate without lower urinary tract symptoms: Secondary | ICD-10-CM | POA: Insufficient documentation

## 2019-01-13 DIAGNOSIS — E785 Hyperlipidemia, unspecified: Secondary | ICD-10-CM | POA: Insufficient documentation

## 2019-01-13 DIAGNOSIS — Z923 Personal history of irradiation: Secondary | ICD-10-CM | POA: Insufficient documentation

## 2019-01-13 DIAGNOSIS — Z79899 Other long term (current) drug therapy: Secondary | ICD-10-CM | POA: Diagnosis not present

## 2019-01-13 DIAGNOSIS — Z7982 Long term (current) use of aspirin: Secondary | ICD-10-CM | POA: Diagnosis not present

## 2019-01-13 DIAGNOSIS — I252 Old myocardial infarction: Secondary | ICD-10-CM | POA: Insufficient documentation

## 2019-01-13 DIAGNOSIS — Z9221 Personal history of antineoplastic chemotherapy: Secondary | ICD-10-CM | POA: Diagnosis not present

## 2019-01-13 LAB — COMPREHENSIVE METABOLIC PANEL
ALT: 29 U/L (ref 0–44)
AST: 29 U/L (ref 15–41)
Albumin: 3.4 g/dL — ABNORMAL LOW (ref 3.5–5.0)
Alkaline Phosphatase: 197 U/L — ABNORMAL HIGH (ref 38–126)
Anion gap: 7 (ref 5–15)
BUN: 10 mg/dL (ref 8–23)
CO2: 25 mmol/L (ref 22–32)
Calcium: 8.5 mg/dL — ABNORMAL LOW (ref 8.9–10.3)
Chloride: 107 mmol/L (ref 98–111)
Creatinine, Ser: 0.97 mg/dL (ref 0.61–1.24)
GFR calc Af Amer: >60 mL/min (ref >60)
GFR calc non Af Amer: >60 mL/min (ref >60)
Glucose, Bld: 107 mg/dL — ABNORMAL HIGH (ref 70–99)
Potassium: 3.3 mmol/L — ABNORMAL LOW (ref 3.5–5.1)
Sodium: 139 mmol/L (ref 135–145)
Total Bilirubin: 0.9 mg/dL (ref 0.3–1.2)
Total Protein: 6.8 g/dL (ref 6.5–8.1)

## 2019-01-13 LAB — CBC WITH DIFFERENTIAL/PLATELET
Abs Immature Granulocytes: 0.05 10*3/uL (ref 0.00–0.07)
Basophils Absolute: 0 10*3/uL (ref 0.0–0.1)
Basophils Relative: 1 %
Eosinophils Absolute: 0.1 10*3/uL (ref 0.0–0.5)
Eosinophils Relative: 1 %
HCT: 38.3 % — ABNORMAL LOW (ref 39.0–52.0)
Hemoglobin: 12.6 g/dL — ABNORMAL LOW (ref 13.0–17.0)
Immature Granulocytes: 1 %
Lymphocytes Relative: 13 %
Lymphs Abs: 0.9 10*3/uL (ref 0.7–4.0)
MCH: 29.7 pg (ref 26.0–34.0)
MCHC: 32.9 g/dL (ref 30.0–36.0)
MCV: 90.3 fL (ref 80.0–100.0)
Monocytes Absolute: 0.8 10*3/uL (ref 0.1–1.0)
Monocytes Relative: 12 %
Neutro Abs: 5.3 10*3/uL (ref 1.7–7.7)
Neutrophils Relative %: 72 %
Platelets: 179 10*3/uL (ref 150–400)
RBC: 4.24 MIL/uL (ref 4.22–5.81)
RDW: 15.7 % — ABNORMAL HIGH (ref 11.5–15.5)
WBC: 7.2 10*3/uL (ref 4.0–10.5)
nRBC: 0 % (ref 0.0–0.2)

## 2019-01-13 LAB — TSH: TSH: 1.138 u[IU]/mL (ref 0.350–4.500)

## 2019-01-13 MED ORDER — SODIUM CHLORIDE 0.9% FLUSH
10.0000 mL | Freq: Once | INTRAVENOUS | Status: AC
  Administered 2019-01-13: 10 mL via INTRAVENOUS
  Filled 2019-01-13: qty 10

## 2019-01-13 MED ORDER — HEPARIN SOD (PORK) LOCK FLUSH 100 UNIT/ML IV SOLN
500.0000 [IU] | Freq: Once | INTRAVENOUS | Status: AC
  Administered 2019-01-13: 12:00:00 500 [IU] via INTRAVENOUS

## 2019-01-13 MED ORDER — SODIUM CHLORIDE 0.9 % IV SOLN
10.0000 mg/kg | Freq: Once | INTRAVENOUS | Status: AC
  Administered 2019-01-13: 11:00:00 700 mg via INTRAVENOUS
  Filled 2019-01-13: qty 10

## 2019-01-13 MED ORDER — SODIUM CHLORIDE 0.9 % IV SOLN
Freq: Once | INTRAVENOUS | Status: AC
  Administered 2019-01-13: 10:00:00 via INTRAVENOUS
  Filled 2019-01-13: qty 250

## 2019-01-13 NOTE — Progress Notes (Signed)
Hematology/Oncology follow up note [facility] [phone] [phone]   Patient Care Team: Baxter Hire, MD as PCP - General (Internal Medicine) Sherren Mocha, MD as PCP - Cardiology (Cardiology) Telford Nab, RN as Registered Nurse  REFERRING PROVIDER: Dr.Fleming REASON FOR VISIT:  Follow-up for management of stage III non-small cell lung cancer   HISTORY OF PRESENTING ILLNESS:  Timothy Yoder is a  63 y.o.  male with PMH listed below who was referred to me for evaluation of lung mass.  Patient is a former smoker quit smoking 7 years ago.  40-pack-year. Patient chest lung cancer screening on 03/11/2018. CT scan showed 2.4 cm right lower lobe nodule with a new necrotic.  Subcarinal lymph node. PET scan showed hypermetabolic right lower pulmonary nodule, consistent with primary bronchogenic carcinoma.  Fluid density subcarinal structure demonstrate no significant hypermetabolism.  Although this could represent an incidental benign lesion such as a cyst given the interval development since 02/24/2017, necrotic adenopathy cannot be excluded and tissue sampling should be considered.  Patient was seen by Dr. Faith Rogue for initial evaluation on 03/13/2018.  Patient has not got PET scan at that time. Today patient was accompanied by his wife to the oncology clinic to discuss management plan.  And PET scan results. Patient reports chronic shortness of breath with moderate exertion.  Appetite is good denies any fever, chills, hemoptysis, weight loss.  # underwent biopsy via bronchoscopy.  Subcarinal lymph node biopsy positive for non-small cell lung cancer, favoring poorly differentiated adenocarcinoma. #Mediport was placed by Dr. Genevive Bi  # #Elevated alkaline phosphatase. Patient has been evaluated by gastroenterology.  Liver biopsy showed mild sinusoidal dilation and congestion.  Minimal changes suggestive of hepatic process. Iron stain demonstrate  minimal focal hepatocellular iron. I discussed with Dr.Wohl via secure chat about patient's pathology result.  No restriction of proceeding with immunotherapy.  Continue watch for the labs.   # Cancer Treatment 04/26/2018 -06/11/2018 Concurrent Chemotherapy [weekly Carbo and Taxol] with RT.  07/10/2018 Started on maintenance Durvalumab.  INTERVAL HISTORY Timothy Yoder is a 63 y.o. male who has above history reviewed by me today presents for follow up visit for management of stage III non-small cell lung cancer.  Patient has been on maintenance immunotherapy. Patient reports feeling well at baseline.  Tolerating immunotherapy.  No new complaints today. Denies any diarrhea, nausea vomiting, fever or chills.  No skin rash. Fatigue is chronic.  No aggravating or improving factors  Patient reports feeling well.  Tolerating immunotherapy. No new complaints. Denies any pain. Fatigue is chronic.  No aggravating or improving factors     Review of Systems  Constitutional: Positive for fatigue. Negative for appetite change, chills, fever and unexpected weight change.  HENT:   Negative for hearing loss and voice change.   Eyes: Negative for eye problems and icterus.  Respiratory: Negative for chest tightness, cough and shortness of breath.   Cardiovascular: Negative for chest pain and leg swelling.  Gastrointestinal: Negative for abdominal distention and abdominal pain.  Endocrine: Negative for hot flashes.  Genitourinary: Negative for difficulty urinating, dysuria and frequency.   Musculoskeletal: Negative for arthralgias.  Skin: Negative for itching and rash.  Neurological: Negative for light-headedness and numbness.  Hematological: Negative for adenopathy. Does not bruise/bleed easily.  Psychiatric/Behavioral: Negative for confusion.   MEDICAL HISTORY:  Past Medical History:  Diagnosis Date  . BPH (benign prostatic hyperplasia)   . CAD (coronary artery disease)    a. 04/2014 anterolateral  STEMI/PCI: LM  nl, LAD 59m(2.75x16 Promus DES, D1 50p, LCX nl, RI nl, OM1 100 small/chronic, RCA dominant, [id number](FFR 0.90->Med Rx), RPDA 50, RPLA 70, EF 60%.  . CMarland KitchenPD (chronic obstructive pulmonary disease) (HCC)    Dr FleRaul Del Former tobacco use   . Gastroesophageal reflux disease   . HTN (hypertension)   . Hyperlipidemia   . Hypokalemia   . Malignant [place] of lung ([place]  . Myocardial infarction ([place]   Dr MicSherren Mocha Stented coronary artery     SURGICAL HISTORY: Past Surgical History:  Procedure Laterality Date  . CARDIAC CATHETERIZATION    . CHOLECYSTECTOMY N/A 02/15/2016   Procedure: LAPAROSCOPIC CHOLECYSTECTOMY WITH INTRAOPERATIVE CHOLANGIOGRAM;  Surgeon: JarLeonie GreenD;  Location: [facility] ORS;  Service: General;  Laterality: N/A;  . COLONOSCOPY    . COLONOSCOPY WITH PROPOFOL N/A 08/02/2015   Procedure: COLONOSCOPY WITH PROPOFOL;  Surgeon: RobManya SilvasD;  [facility];  Service: Endoscopy;  Laterality: N/A;  . CORONARY ANGIOPLASTY  04/2014   STENT  . ENDOBRONCHIAL ULTRASOUND N/A 03/26/2018   Procedure: ENDOBRONCHIAL ULTRASOUND;  Surgeon: KasFlora LippsD;  Location: [facility] ORS;  Service: Cardiopulmonary;  Laterality: N/A;  . ESOPHAGOGASTRODUODENOSCOPY (EGD) WITH PROPOFOL N/A 08/02/2015   Procedure: ESOPHAGOGASTRODUODENOSCOPY (EGD) WITH PROPOFOL;  Surgeon: RobManya SilvasD;  [facility];  Service: Endoscopy;  Laterality: N/A;  . FLEXIBLE BRONCHOSCOPY N/A 03/26/2018   Procedure: FLEXIBLE BRONCHOSCOPY;  Surgeon: KasFlora LippsD;  Location: [facility] ORS;  Service: Cardiopulmonary;  Laterality: N/A;  . KNEE ARTHROSCOPY Left   . LEFT HEART CATHETERIZATION WITH CORONARY ANGIOGRAM N/A 05/03/2014   Procedure: LEFT HEART CATHETERIZATION WITH CORONARY ANGIOGRAM;  Surgeon: MicBlane OharaD;  [facility];  Service: Cardiovascular;  Laterality: N/A;  . PERCUTANEOUS CORONARY STENT INTERVENTION (PCI-S) N/A  05/05/2014   Procedure: PERCUTANEOUS CORONARY STENT INTERVENTION (PCI-S);  Surgeon: Peter M JorMartiniqueD;  [facility];  Service: Cardiovascular;  Laterality: N/A;  . PORTACATH PLACEMENT Left 04/04/2018   Procedure: INSERTION PORT-A-CATH;  Surgeon: OakNestor LewandowskyD;  Location: [facility] ORS;  Service: General;  Laterality: Left;    SOCIAL HISTORY: Social History   Socioeconomic History  . Marital status: Married    Spouse name: Not on file  . Number of children: Not on file  . Years of education: Not on file  . Highest education level: Not on file  Occupational History  . Not on file  Social Needs  . Financial resource strain: Not on file  . Food insecurity    Worry: Not on file    Inability: Not on file  . Transportation needs    Medical: Not on file    Non-medical: Not on file  Tobacco Use  . Smoking status: Former Smoker    Packs/day: 1.00    Years: 40.00    Pack years: 40.00    Types: Cigarettes    Quit date: 02/14/2011    Years since quitting: 7.9  . Smokeless tobacco: Never Used  Substance and Sexual Activity  . Alcohol use: No  . Drug use: No  . Sexual activity: Not on file  Lifestyle  . Physical activity    Days per week: Not on file    Minutes per session: Not on file  . Stress: Not on file  Relationships  . Social conHerbalist phone: Not on file    Gets together: Not on file    Attends religious service:  Not on file    Active member of club or organization: Not on file    Attends meetings of clubs or organizations: Not on file    Relationship status: Not on file  . Intimate partner violence    Fear of current or ex partner: Not on file    Emotionally abused: Not on file    Physically abused: Not on file    Forced sexual activity: Not on file  Other Topics Concern  . Not on file  Social History Narrative   The patient is married. He lives in [place]. He works full-time. He quit smoking in 2013.    FAMILY HISTORY: Family History   Problem Relation Age of Onset  . CAD Mother 59       Died of an MI  . CAD Father 90       Died of a massive MI  . COPD Sister   . Lung cancer Brother   . Bone cancer Paternal Uncle   . Colon cancer Neg Hx   . Breast cancer Neg Hx     ALLERGIES:  has No Known Allergies.  MEDICATIONS:  Current Outpatient Medications  Medication Sig Dispense Refill  . acetaminophen (TYLENOL) 500 MG tablet Take 1,000 mg by mouth every 6 (six) hours as needed (for pain.).    Marland Kitchen albuterol (PROAIR HFA) 108 (90 Base) MCG/ACT inhaler Inhale 2 puffs into the lungs every 6 (six) hours as needed for wheezing or shortness of breath.     Marland Kitchen aspirin EC 81 MG tablet Take 81 mg by mouth daily.    Marland Kitchen atorvastatin (LIPITOR) 80 MG tablet Take 1 tablet (80 mg total) by mouth daily at 6 PM. 90 tablet 3  . carvedilol (COREG) 6.25 MG tablet Take 1 tablet (6.25 mg total) by mouth 2 (two) times daily. 180 tablet 3  . colestipol (COLESTID) 1 g tablet Take 2 g by mouth as needed (for stomach issues).     . folic acid (FOLVITE) 1 MG tablet Take 1 tablet (1 mg total) by mouth daily. 90 tablet 1  . lidocaine-prilocaine (EMLA) cream APPLY TO AFFECTED AREA ONCE AS DIRECTED    . nitroGLYCERIN (NITROSTAT) 0.4 MG SL tablet Place 1 tablet (0.4 mg total) under the tongue every 5 (five) minutes as needed for chest pain. X 3 doses 25 tablet 3  . omeprazole (PRILOSEC) 40 MG capsule TAKE 1 CAPSULE BY MOUTH TWICE A DAY 180 capsule 1  . ondansetron (ZOFRAN) 8 MG tablet PLEASE SEE ATTACHED FOR DETAILED DIRECTIONS    . oxyCODONE-acetaminophen (PERCOCET/ROXICET) 5-325 MG tablet Take by mouth.    . potassium chloride SA (KLOR-CON M20) 20 MEQ tablet Take 1 tablet (20 mEq total) by mouth daily. 90 tablet 3  . sucralfate (CARAFATE) 1 g tablet Take 1 tablet (1 g total) by mouth 3 (three) times daily before meals. Dissolve in warm water, swish and swallow 90 tablet 6  . tamsulosin (FLOMAX) 0.4 MG CAPS capsule Take 0.4 mg by mouth daily after supper.    .  traZODone (DESYREL) 50 MG tablet Take 1 tablet (50 mg total) by mouth at bedtime. 90 tablet 0  . umeclidinium-vilanterol (ANORO ELLIPTA) 62.5-25 MCG/INH AEPB Inhale 1 puff into the lungs every morning.    . vitamin B-12 (CYANOCOBALAMIN) 1000 MCG tablet Take 1 tablet (1,000 mcg total) by mouth daily. 90 tablet 1  . lisinopril (ZESTRIL) 10 MG tablet Take 1 tablet (10 mg total) by mouth daily. (Patient not taking: Reported on 01/12/2019)  90 tablet 3   No current facility-administered medications for this visit.      PHYSICAL EXAMINATION: ECOG PERFORMANCE STATUS: 0 - Asymptomatic Vitals:   01/13/19 0853 01/13/19 0855  BP:  (!) 150/88  Pulse:  74  Temp: 97.9 F (36.6 C) (!) 97.1 F (36.2 C)   Filed Weights   01/13/19 0853 01/13/19 0855  Weight: 151 lb (68.5 kg) 151 lb (68.5 kg)    Physical Exam Constitutional:      General: He is not in acute distress. HENT:     Head: Normocephalic and atraumatic.  Eyes:     General: No scleral icterus.    Pupils: Pupils are equal, round, and reactive to light.  Neck:     Musculoskeletal: Normal range of motion and neck supple.  Cardiovascular:     Rate and Rhythm: Normal rate and regular rhythm.     Heart sounds: Normal heart sounds.  Pulmonary:     Effort: Pulmonary effort is normal. No respiratory distress.     Breath sounds: No wheezing.  Abdominal:     General: Bowel sounds are normal. There is no distension.     Palpations: Abdomen is soft. There is no mass.     Tenderness: There is no abdominal tenderness.  Musculoskeletal: Normal range of motion.        General: No deformity.  Skin:    General: Skin is warm and dry.     Findings: No erythema or rash.  Neurological:     Mental Status: He is alert and oriented to person, place, and time.     Cranial Nerves: No cranial nerve deficit.     Coordination: Coordination normal.  Psychiatric:        Behavior: Behavior normal.        Thought Content: Thought content normal.       LABORATORY DATA:  I have reviewed the data as listed Lab Results  Component Value Date   WBC 7.2 01/13/2019   HGB 12.6 (L) 01/13/2019   HCT 38.3 (L) 01/13/2019   MCV 90.3 01/13/2019   PLT 179 01/13/2019   Recent Labs    12/09/18 0849 12/23/18 0834 01/13/19 0838  NA 139 136 139  K 4.0 4.2 3.3*  CL 107 104 107  CO2 _0 GLUCOSE 96 89 107*  BUN _1 CREATININE 1.04 0.94 0.97  CALCIUM 8.8* 8.9 8.5*  GFRNONAA >60 >60 >60  GFRAA >60 >60 >60  PROT 6.5 7.1 6.8  ALBUMIN 3.4* 3.6 3.4*  AST _2 ALT _3 ALKPHOS 223* 216* 197*  BILITOT 0.6 0.8 0.9   Iron/TIBC/Ferritin/ %Sat    Component Value Date/Time   IRON 41 (L) 11/21/2018 0856   TIBC 260 11/21/2018 0856   FERRITIN 150 11/21/2018 0856   IRONPCTSAT 16 (L) 11/21/2018 0856    RADIOGRAPHIC STUDIES: I have personally reviewed the radiological images as listed and agreed with the findings in the report.. 03/11/2018 CT chest lung screen 1. New 2.4 cm right lower lobe nodule with a new necrotic appearing subcarinal lymph node. Lung-RADS 4B, suspicious. Additional imaging evaluation or consultation with Pulmonology or Thoracic Surgery recommended. These results will be called to the ordering clinician or representative by the Radiologist Assistant, and communication documented in the PACS or zVision Dashboard. 2. Aortic atherosclerosis (ICD10-170.0). Coronary arterycalcification.3.  Emphysema (ICD10-J43.9). 03/13/2018 PET scan # Hypermetabolic right lower lobe pulmonary nodule, consistent with primary bronchogenic carcinoma. Fluid density subcarinal structure demonstrates  no significant hypermetabolism. Although this could represent an incidental benign lesion such as a bronchogenic cyst, given interval development since 02/24/2017, necrotic adenopathy cannot be excluded and tissue sampling should be considered.  03/26/2018 DIAGNOSIS:  A. SUBCARINA, CYSTIC MASS; FNA:  - NON-SMALL CELL CARCINOMA CONSISTENT WITH  POORLY DIFFERENTIATED  ADENOCARCINOMA.   MRI brain 04/01/2018  Normal MRI head with and without contrast for age.  No intracranial metastasis.  07/05/2018 CT chest w contrast #Significant partial treatment response.  Medial right lower lobe pulmonary nodule is significantly decreased.  Subcarinal adenopathy has resolved. No new or progressive metastatic disease in the chest.  Aortic sclerosis and emphysema.   ASSESSMENT & PLAN:  1. Malignant neoplasm of lower lobe of right lung ([place])   2. Encounter for antineoplastic immunotherapy   3. Elevated alkaline phosphatase level   4. Other fatigue   Cancer Staging Malignant neoplasm of lower lobe of right lung [facility]) Staging form: Lung, AJCC 8th Edition - Clinical stage from 05/13/2018: Stage IIIA (cT1c, cN2, cM0) - Signed by Earlie Server, MD on 05/13/2018  #Stage IIIA lung adenocarcinoma Labs are reviewed and discussed with patient. Counts acceptable to proceed with durvalumab. Patient is due for CT chest with contrast for surveillance.  Will obtain.  #Elevated alkaline phosphatase, labs are reviewed and discussed with patient. Although etiology of the elevated alkaline phosphatase was not clear.  Numbers are trending down.  Continue to monitor per GI recommendation.  Fatigue is chronic at baseline.  TSH has been monitored and has been stable. Follow-up in 2 weeks for evaluation prior to next cycle of durvalumab treatments.   Earlie Server, MD, PhD 01/13/19

## 2019-01-20 ENCOUNTER — Other Ambulatory Visit: Payer: Self-pay

## 2019-01-20 ENCOUNTER — Ambulatory Visit
Admission: RE | Admit: 2019-01-20 | Discharge: 2019-01-20 | Disposition: A | Discharge status: Home / Self Care | Payer: BC Managed Care – PPO | Source: Ambulatory Visit | Attending: Oncology | Admitting: Oncology

## 2019-01-20 DIAGNOSIS — C3431 Malignant neoplasm of lower lobe, right bronchus or lung: Secondary | ICD-10-CM | POA: Diagnosis not present

## 2019-01-20 MED ORDER — IOHEXOL 300 MG/ML  SOLN
75.0000 mL | Freq: Once | INTRAMUSCULAR | Status: AC | PRN
  Administered 2019-01-20: 75 mL via INTRAVENOUS

## 2019-01-27 ENCOUNTER — Inpatient Hospital Stay: Payer: BC Managed Care – PPO | Attending: Oncology

## 2019-01-27 ENCOUNTER — Inpatient Hospital Stay (HOSPITAL_BASED_OUTPATIENT_CLINIC_OR_DEPARTMENT_OTHER): Payer: BC Managed Care – PPO | Admitting: Oncology

## 2019-01-27 ENCOUNTER — Other Ambulatory Visit: Payer: Self-pay

## 2019-01-27 ENCOUNTER — Encounter: Payer: Self-pay | Admitting: Oncology

## 2019-01-27 ENCOUNTER — Inpatient Hospital Stay: Payer: BC Managed Care – PPO

## 2019-01-27 VITALS — BP 132/77 | HR 66 | Temp 96.3°F | Resp 18 | Wt 148.5 lb

## 2019-01-27 DIAGNOSIS — I2510 Atherosclerotic heart disease of native coronary artery without angina pectoris: Secondary | ICD-10-CM | POA: Insufficient documentation

## 2019-01-27 DIAGNOSIS — Z95828 Presence of other vascular implants and grafts: Secondary | ICD-10-CM

## 2019-01-27 DIAGNOSIS — Z5112 Encounter for antineoplastic immunotherapy: Secondary | ICD-10-CM | POA: Diagnosis not present

## 2019-01-27 DIAGNOSIS — R748 Abnormal levels of other serum enzymes: Secondary | ICD-10-CM

## 2019-01-27 DIAGNOSIS — C3431 Malignant neoplasm of lower lobe, right bronchus or lung: Secondary | ICD-10-CM

## 2019-01-27 DIAGNOSIS — Z79899 Other long term (current) drug therapy: Secondary | ICD-10-CM | POA: Insufficient documentation

## 2019-01-27 DIAGNOSIS — Z23 Encounter for immunization: Secondary | ICD-10-CM | POA: Diagnosis not present

## 2019-01-27 DIAGNOSIS — R5383 Other fatigue: Secondary | ICD-10-CM

## 2019-01-27 LAB — TSH: TSH: 2.493 u[IU]/mL (ref 0.350–4.500)

## 2019-01-27 LAB — COMPREHENSIVE METABOLIC PANEL
ALT: 26 U/L (ref 0–44)
AST: 26 U/L (ref 15–41)
Albumin: 3.6 g/dL (ref 3.5–5.0)
Alkaline Phosphatase: 194 U/L — ABNORMAL HIGH (ref 38–126)
Anion gap: 9 (ref 5–15)
BUN: 8 mg/dL (ref 8–23)
CO2: 25 mmol/L (ref 22–32)
Calcium: 8.6 mg/dL — ABNORMAL LOW (ref 8.9–10.3)
Chloride: 105 mmol/L (ref 98–111)
Creatinine, Ser: 0.89 mg/dL (ref 0.61–1.24)
GFR calc Af Amer: >60 mL/min (ref >60)
GFR calc non Af Amer: >60 mL/min (ref >60)
Glucose, Bld: 123 mg/dL — ABNORMAL HIGH (ref 70–99)
Potassium: 3.8 mmol/L (ref 3.5–5.1)
Sodium: 139 mmol/L (ref 135–145)
Total Bilirubin: 0.4 mg/dL (ref 0.3–1.2)
Total Protein: 6.9 g/dL (ref 6.5–8.1)

## 2019-01-27 LAB — CBC WITH DIFFERENTIAL/PLATELET
Abs Immature Granulocytes: 0.09 10*3/uL — ABNORMAL HIGH (ref 0.00–0.07)
Basophils Absolute: 0.1 10*3/uL (ref 0.0–0.1)
Basophils Relative: 1 %
Eosinophils Absolute: 0.1 10*3/uL (ref 0.0–0.5)
Eosinophils Relative: 2 %
HCT: 40.5 % (ref 39.0–52.0)
Hemoglobin: 13.3 g/dL (ref 13.0–17.0)
Immature Granulocytes: 1 %
Lymphocytes Relative: 15 %
Lymphs Abs: 1 10*3/uL (ref 0.7–4.0)
MCH: 29.9 pg (ref 26.0–34.0)
MCHC: 32.8 g/dL (ref 30.0–36.0)
MCV: 91 fL (ref 80.0–100.0)
Monocytes Absolute: 0.6 10*3/uL (ref 0.1–1.0)
Monocytes Relative: 9 %
Neutro Abs: 4.9 10*3/uL (ref 1.7–7.7)
Neutrophils Relative %: 72 %
Platelets: 199 10*3/uL (ref 150–400)
RBC: 4.45 MIL/uL (ref 4.22–5.81)
RDW: 15.5 % (ref 11.5–15.5)
WBC: 6.8 10*3/uL (ref 4.0–10.5)
nRBC: 0 % (ref 0.0–0.2)

## 2019-01-27 MED ORDER — SODIUM CHLORIDE 0.9 % IV SOLN
Freq: Once | INTRAVENOUS | Status: AC
  Administered 2019-01-27: 10:00:00 via INTRAVENOUS
  Filled 2019-01-27: qty 250

## 2019-01-27 MED ORDER — INFLUENZA VAC SPLIT QUAD 0.5 ML IM SUSY
0.5000 mL | PREFILLED_SYRINGE | Freq: Once | INTRAMUSCULAR | Status: AC
  Administered 2019-01-27: 0.5 mL via INTRAMUSCULAR
  Filled 2019-01-27: qty 0.5

## 2019-01-27 MED ORDER — SODIUM CHLORIDE 0.9% FLUSH
10.0000 mL | Freq: Once | INTRAVENOUS | Status: AC
  Administered 2019-01-27: 10 mL via INTRAVENOUS
  Filled 2019-01-27: qty 10

## 2019-01-27 MED ORDER — HEPARIN SOD (PORK) LOCK FLUSH 100 UNIT/ML IV SOLN
500.0000 [IU] | Freq: Once | INTRAVENOUS | Status: AC | PRN
  Administered 2019-01-27: 500 [IU]

## 2019-01-27 MED ORDER — SODIUM CHLORIDE 0.9 % IV SOLN
10.0000 mg/kg | Freq: Once | INTRAVENOUS | Status: AC
  Administered 2019-01-27: 11:00:00 700 mg via INTRAVENOUS
  Filled 2019-01-27: qty 10

## 2019-01-27 NOTE — Progress Notes (Signed)
Hematology/Oncology follow up note [facility] [phone] [phone]   Patient Care Team: Baxter Hire, MD as PCP - General (Internal Medicine) Sherren Mocha, MD as PCP - Cardiology (Cardiology) Telford Nab, RN as Registered Nurse  REFERRING PROVIDER: Dr.Fleming REASON FOR VISIT:  Follow-up for management of stage III non-small cell lung cancer   HISTORY OF PRESENTING ILLNESS:  Timothy Yoder is a  63 y.o.  male with PMH listed below who was referred to me for evaluation of lung mass.  Patient is a former smoker quit smoking 7 years ago.  40-pack-year. Patient chest lung cancer screening on 03/11/2018. CT scan showed 2.4 cm right lower lobe nodule with a new necrotic.  Subcarinal lymph node. PET scan showed hypermetabolic right lower pulmonary nodule, consistent with primary bronchogenic carcinoma.  Fluid density subcarinal structure demonstrate no significant hypermetabolism.  Although this could represent an incidental benign lesion such as a cyst given the interval development since 02/24/2017, necrotic adenopathy cannot be excluded and tissue sampling should be considered.  Patient was seen by Dr. Faith Rogue for initial evaluation on 03/13/2018.  Patient has not got PET scan at that time. Today patient was accompanied by his wife to the oncology clinic to discuss management plan.  And PET scan results. Patient reports chronic shortness of breath with moderate exertion.  Appetite is good denies any fever, chills, hemoptysis, weight loss.  # underwent biopsy via bronchoscopy.  Subcarinal lymph node biopsy positive for non-small cell lung cancer, favoring poorly differentiated adenocarcinoma. #Mediport was placed by Dr. Genevive Bi  # #Elevated alkaline phosphatase. Patient has been evaluated by gastroenterology.  Liver biopsy showed mild sinusoidal dilation and congestion.  Minimal changes suggestive of hepatic process. Iron stain demonstrate  minimal focal hepatocellular iron. I discussed with Dr.Wohl via secure chat about patient's pathology result.  No restriction of proceeding with immunotherapy.  Continue watch for the labs.   # Cancer Treatment 04/26/2018 -06/11/2018 Concurrent Chemotherapy [weekly Carbo and Taxol] with RT.  07/10/2018 Started on maintenance Durvalumab.  INTERVAL HISTORY Timothy Yoder is a 63 y.o. male who has above history reviewed by me today presents for follow up visit for management of stage III non-small cell lung cancer.  Patient has been on maintenance immunotherapy. Patient reports feeling well at baseline today. Tolerating immunotherapy.  No new complaints today. Fatigue is chronic.  No aggravating or improving factors. Reports that he does not have any significant pain or shortness of breath or cough than his baseline. Denies any pain today.   Review of Systems  Constitutional: Positive for fatigue. Negative for appetite change, chills, fever and unexpected weight change.  HENT:   Negative for hearing loss and voice change.   Eyes: Negative for eye problems and icterus.  Respiratory: Negative for chest tightness, cough and shortness of breath.   Cardiovascular: Negative for chest pain and leg swelling.  Gastrointestinal: Negative for abdominal distention and abdominal pain.  Endocrine: Negative for hot flashes.  Genitourinary: Negative for difficulty urinating, dysuria and frequency.   Musculoskeletal: Negative for arthralgias.  Skin: Negative for itching and rash.  Neurological: Negative for light-headedness and numbness.  Hematological: Negative for adenopathy. Does not bruise/bleed easily.  Psychiatric/Behavioral: Negative for confusion.   MEDICAL HISTORY:  Past Medical History:  Diagnosis Date  . BPH (benign prostatic hyperplasia)   . CAD (coronary artery disease)    a. 04/2014 anterolateral STEMI/PCI: LM nl, LAD 87m(2.75x16 Promus DES, D1 50p, LCX nl, RI nl, OM1 100 small/chronic, RCA  dominant, 20-17m 780m (FFR 0.90->Med Rx), RPDA 50, RPLA 70, EF 60%.  . Marland KitchenOPD (chronic obstructive pulmonary disease) (HCC)    Dr FlRaul Del. Former tobacco use   . Gastroesophageal reflux disease   . HTN (hypertension)   . Hyperlipidemia   . Hypokalemia   . [facility] [place]  . [place] [place] [place]   [place] Mi[place]ren Mocha. Stented coronary artery     SURGICAL HISTORY: Past Surgical History:  Procedure Laterality Date  . CARDIAC CATHETERIZATION    . CHOLECYSTECTOMY N/A 02/15/2016   Procedure: LAPAROSCOPIC CHOLECYSTECTOMY WITH INTRAOPERATIVE CHOLANGIOGRAM;  Surgeon: JaLeonie GreenMD;  Location: [facility] ORS;  Service: General;  Laterality: N/A;  . COLONOSCOPY    . COLONOSCOPY WITH PROPOFOL N/A 08/02/2015   Procedure: COLONOSCOPY WITH PROPOFOL;  Surgeon: RoManya SilvasMD;  [facility];  Service: Endoscopy;  Laterality: N/A;  . CORONARY ANGIOPLASTY  04/2014   STENT  . ENDOBRONCHIAL ULTRASOUND N/A 03/26/2018   Procedure: ENDOBRONCHIAL ULTRASOUND;  Surgeon: KaFlora LippsMD;  Location: [facility] ORS;  Service: Cardiopulmonary;  Laterality: N/A;  . ESOPHAGOGASTRODUODENOSCOPY (EGD) WITH PROPOFOL N/A 08/02/2015   Procedure: ESOPHAGOGASTRODUODENOSCOPY (EGD) WITH PROPOFOL;  Surgeon: RoManya SilvasMD;  [facility];  Service: Endoscopy;  Laterality: N/A;  . FLEXIBLE BRONCHOSCOPY N/A 03/26/2018   Procedure: FLEXIBLE BRONCHOSCOPY;  Surgeon: KaFlora LippsMD;  Location: [facility] ORS;  Service: Cardiopulmonary;  Laterality: N/A;  . KNEE ARTHROSCOPY Left   . LEFT HEART CATHETERIZATION WITH CORONARY ANGIOGRAM N/A 05/03/2014   Procedure: LEFT HEART CATHETERIZATION WITH CORONARY ANGIOGRAM;  Surgeon: MiBlane OharaMD;  [facility];  Service: Cardiovascular;  Laterality: N/A;  . PERCUTANEOUS CORONARY STENT INTERVENTION (PCI-S) N/A 05/05/2014   Procedure: PERCUTANEOUS CORONARY STENT INTERVENTION (PCI-S);  Surgeon: Peter M JoMartinique MD;  [facility];  Service: Cardiovascular;  Laterality: N/A;  . PORTACATH PLACEMENT Left 04/04/2018   Procedure: INSERTION PORT-A-CATH;  Surgeon: OaNestor LewandowskyMD;  Location: [facility] ORS;  Service: General;  Laterality: Left;    SOCIAL HISTORY: Social History   Socioeconomic History  . Marital status: Married    Spouse name: Not on file  . Number of children: Not on file  . Years of education: Not on file  . Highest education level: Not on file  Occupational History  . Not on file  Social Needs  . Financial resource strain: Not on file  . Food insecurity    Worry: Not on file    Inability: Not on file  . Transportation needs    Medical: Not on file    Non-medical: Not on file  Tobacco Use  . Smoking status: Former Smoker    Packs/day: 1.00    Years: 40.00    Pack years: 40.00    Types: Cigarettes    Quit date: 02/14/2011    Years since quitting: 7.9  . Smokeless tobacco: Never Used  Substance and Sexual Activity  . Alcohol use: No  . Drug use: No  . Sexual activity: Not on file  Lifestyle  . Physical activity    Days per week: Not on file    Minutes per session: Not on file  . Stress: Not on file  Relationships  . Social coHerbalistn phone: Not on file    Gets together: Not on file    Attends religious service: Not on file    Active member of club or organization: Not on file  Attends meetings of clubs or organizations: Not on file    Relationship status: Not on file  . Intimate partner violence    Fear of current or ex partner: Not on file    Emotionally abused: Not on file    Physically abused: Not on file    Forced sexual activity: Not on file  Other Topics Concern  . Not on file  Social History Narrative   The patient is married. He lives in [place]. He works full-time. He quit smoking in 2013.    FAMILY HISTORY: Family History  Problem Relation Age of Onset  . CAD Mother 2       Died of an MI  . CAD Father 39       Died  of a massive MI  . COPD Sister   . Lung cancer Brother   . Bone cancer Paternal Uncle   . Colon cancer Neg Hx   . Breast cancer Neg Hx     ALLERGIES:  has No Known Allergies.  MEDICATIONS:  Current Outpatient Medications  Medication Sig Dispense Refill  . acetaminophen (TYLENOL) 500 MG tablet Take 1,000 mg by mouth every 6 (six) hours as needed (for pain.).    Marland Kitchen albuterol (PROAIR HFA) 108 (90 Base) MCG/ACT inhaler Inhale 2 puffs into the lungs every 6 (six) hours as needed for wheezing or shortness of breath.     Marland Kitchen aspirin EC 81 MG tablet Take 81 mg by mouth daily.    Marland Kitchen atorvastatin (LIPITOR) 80 MG tablet Take 1 tablet (80 mg total) by mouth daily at 6 PM. 90 tablet 3  . carvedilol (COREG) 6.25 MG tablet Take 1 tablet (6.25 mg total) by mouth 2 (two) times daily. 180 tablet 3  . colestipol (COLESTID) 1 g tablet Take 2 g by mouth as needed (for stomach issues).     . folic acid (FOLVITE) 1 MG tablet Take 1 tablet (1 mg total) by mouth daily. 90 tablet 1  . lidocaine-prilocaine (EMLA) cream APPLY TO AFFECTED AREA ONCE AS DIRECTED    . nitroGLYCERIN (NITROSTAT) 0.4 MG SL tablet Place 1 tablet (0.4 mg total) under the tongue every 5 (five) minutes as needed for chest pain. X 3 doses 25 tablet 3  . omeprazole (PRILOSEC) 40 MG capsule TAKE 1 CAPSULE BY MOUTH TWICE A DAY 180 capsule 1  . ondansetron (ZOFRAN) 8 MG tablet PLEASE SEE ATTACHED FOR DETAILED DIRECTIONS    . oxyCODONE-acetaminophen (PERCOCET/ROXICET) 5-325 MG tablet Take by mouth.    . potassium chloride SA (KLOR-CON M20) 20 MEQ tablet Take 1 tablet (20 mEq total) by mouth daily. 90 tablet 3  . sucralfate (CARAFATE) 1 g tablet Take 1 tablet (1 g total) by mouth 3 (three) times daily before meals. Dissolve in warm water, swish and swallow 90 tablet 6  . tamsulosin (FLOMAX) 0.4 MG CAPS capsule Take 0.4 mg by mouth daily after supper.    . traZODone (DESYREL) 50 MG tablet Take 1 tablet (50 mg total) by mouth at bedtime. 90 tablet 0  .  umeclidinium-vilanterol (ANORO ELLIPTA) 62.5-25 MCG/INH AEPB Inhale 1 puff into the lungs every morning.    . vitamin B-12 (CYANOCOBALAMIN) 1000 MCG tablet Take 1 tablet (1,000 mcg total) by mouth daily. 90 tablet 1   No current facility-administered medications for this visit.      PHYSICAL EXAMINATION: ECOG PERFORMANCE STATUS: 0 - Asymptomatic Vitals:   01/27/19 0917  BP: 132/77  Pulse: 66  Resp: 18  Temp: (!) 96.3  F (35.7 C)  SpO2: 99%   Filed Weights   01/27/19 0917  Weight: 148 lb 8 oz (67.4 kg)    Physical Exam Constitutional:      General: He is not in acute distress. HENT:     Head: Normocephalic and atraumatic.  Eyes:     General: No scleral icterus.    Pupils: Pupils are equal, round, and reactive to light.  Neck:     Musculoskeletal: Normal range of motion and neck supple.  Cardiovascular:     Rate and Rhythm: Normal rate and regular rhythm.     Heart sounds: Normal heart sounds.  Pulmonary:     Effort: Pulmonary effort is normal. No respiratory distress.     Breath sounds: No wheezing.  Abdominal:     General: Bowel sounds are normal. There is no distension.     Palpations: Abdomen is soft. There is no mass.     Tenderness: There is no abdominal tenderness.  Musculoskeletal: Normal range of motion.        General: No deformity.  Skin:    General: Skin is warm and dry.     Findings: No erythema or rash.  Neurological:     Mental Status: He is alert and oriented to person, place, and time.     Cranial Nerves: No cranial nerve deficit.     Coordination: Coordination normal.  Psychiatric:        Behavior: Behavior normal.        Thought Content: Thought content normal.      LABORATORY DATA:  I have reviewed the data as listed Lab Results  Component Value Date   WBC 6.8 01/27/2019   HGB 13.3 01/27/2019   HCT 40.5 01/27/2019   MCV 91.0 01/27/2019   PLT 199 01/27/2019   Recent Labs    12/23/18 0834 01/13/19 0838 01/27/19 0856  NA 136 139  139  K 4.2 3.3* 3.8  CL 104 107 105  CO2 _0 GLUCOSE 89 107* 123*  BUN _1 CREATININE 0.94 0.97 0.89  CALCIUM 8.9 8.5* 8.6*  GFRNONAA >60 >60 >60  GFRAA >60 >60 >60  PROT 7.1 6.8 6.9  ALBUMIN 3.6 3.4* 3.6  AST _2 ALT _3 ALKPHOS 216* 197* 194*  BILITOT 0.8 0.9 0.4   Iron/TIBC/Ferritin/ %Sat    Component Value Date/Time   IRON 41 (L) 11/21/2018 0856   TIBC 260 11/21/2018 0856   FERRITIN 150 11/21/2018 0856   IRONPCTSAT 16 (L) 11/21/2018 0856    RADIOGRAPHIC STUDIES: I have personally reviewed the radiological images as listed and agreed with the findings in the report.. 03/11/2018 CT chest lung screen 1. New 2.4 cm right lower lobe nodule with a new necrotic appearing subcarinal lymph node. Lung-RADS 4B, suspicious. Additional imaging evaluation or consultation with Pulmonology or Thoracic Surgery recommended. These results will be called to the ordering clinician or representative by the Radiologist Assistant, and communication documented in the PACS or zVision Dashboard. 2. Aortic atherosclerosis (ICD10-170.0). Coronary arterycalcification.3.  Emphysema (ICD10-J43.9). 03/13/2018 PET scan # Hypermetabolic right lower lobe pulmonary nodule, consistent with primary bronchogenic carcinoma. Fluid density subcarinal structure demonstrates no significant hypermetabolism. Although this could represent an incidental benign lesion such as a bronchogenic cyst, given interval development since 02/24/2017, necrotic adenopathy cannot be excluded and tissue sampling should be considered.  03/26/2018 DIAGNOSIS:  A. SUBCARINA, CYSTIC MASS; FNA:  - NON-SMALL CELL CARCINOMA CONSISTENT WITH POORLY DIFFERENTIATED  ADENOCARCINOMA.  MRI brain 04/01/2018  Normal MRI head with and without contrast for age.  No intracranial metastasis.  07/05/2018 CT chest w contrast #Significant partial treatment response.  Medial right lower lobe pulmonary nodule is significantly decreased.   Subcarinal adenopathy has resolved. No new or progressive metastatic disease in the chest.  Aortic sclerosis and emphysema.   ASSESSMENT & PLAN:  1. Malignant neoplasm of lower lobe of right lung ([place])   2. Port-A-Cath in place   3. Encounter for antineoplastic immunotherapy   4. Elevated alkaline phosphatase level   5. Other fatigue   Cancer Staging Malignant neoplasm of lower lobe of right lung [facility]) Staging form: Lung, AJCC 8th Edition - Clinical stage from 05/13/2018: Stage IIIA (cT1c, cN2, cM0) - Signed by Earlie Server, MD on 05/13/2018  #Stage IIIA lung adenocarcinoma Labs are reviewed and discussed with patient. Counts acceptable to proceed with durvalumab today.   CT surveillance imaging was independent reviewed by me and discussed with patient.  No evidence of recurrence disease.  #Elevated alkaline phosphatase, labs are reviewed and discussed with patient.   Although etiology of the elevated alkaline phosphatase was not clear.  Continue to monitor per GI recommendation.  Numbers trending down.  Fatigue is chronic at baseline.  TSH has been monitored and has been stable. Follow-up in 2 weeks for evaluation prior to next cycle of durvalumab treatments.   Earlie Server, MD, PhD 01/27/19

## 2019-02-07 ENCOUNTER — Other Ambulatory Visit: Payer: Self-pay

## 2019-02-07 NOTE — Progress Notes (Signed)
Patient pre screened for office appointment, no questions or concerns today. 

## 2019-02-10 ENCOUNTER — Inpatient Hospital Stay: Payer: BC Managed Care – PPO

## 2019-02-10 ENCOUNTER — Inpatient Hospital Stay (HOSPITAL_BASED_OUTPATIENT_CLINIC_OR_DEPARTMENT_OTHER): Payer: BC Managed Care – PPO | Admitting: Oncology

## 2019-02-10 ENCOUNTER — Encounter: Payer: Self-pay | Admitting: Oncology

## 2019-02-10 ENCOUNTER — Other Ambulatory Visit: Payer: Self-pay

## 2019-02-10 VITALS — BP 131/82 | HR 67 | Temp 96.6°F | Resp 18 | Wt 152.4 lb

## 2019-02-10 DIAGNOSIS — C3431 Malignant neoplasm of lower lobe, right bronchus or lung: Secondary | ICD-10-CM

## 2019-02-10 DIAGNOSIS — Z5112 Encounter for antineoplastic immunotherapy: Secondary | ICD-10-CM

## 2019-02-10 DIAGNOSIS — R748 Abnormal levels of other serum enzymes: Secondary | ICD-10-CM | POA: Diagnosis not present

## 2019-02-10 DIAGNOSIS — R5383 Other fatigue: Secondary | ICD-10-CM

## 2019-02-10 DIAGNOSIS — Z95828 Presence of other vascular implants and grafts: Secondary | ICD-10-CM

## 2019-02-10 LAB — COMPREHENSIVE METABOLIC PANEL
ALT: 31 U/L (ref 0–44)
AST: 25 U/L (ref 15–41)
Albumin: 3.7 g/dL (ref 3.5–5.0)
Alkaline Phosphatase: 184 U/L — ABNORMAL HIGH (ref 38–126)
Anion gap: 6 (ref 5–15)
BUN: 9 mg/dL (ref 8–23)
CO2: 24 mmol/L (ref 22–32)
Calcium: 8.8 mg/dL — ABNORMAL LOW (ref 8.9–10.3)
Chloride: 106 mmol/L (ref 98–111)
Creatinine, Ser: 1 mg/dL (ref 0.61–1.24)
GFR calc Af Amer: >60 mL/min (ref >60)
GFR calc non Af Amer: >60 mL/min (ref >60)
Glucose, Bld: 118 mg/dL — ABNORMAL HIGH (ref 70–99)
Potassium: 3.8 mmol/L (ref 3.5–5.1)
Sodium: 136 mmol/L (ref 135–145)
Total Bilirubin: 0.6 mg/dL (ref 0.3–1.2)
Total Protein: 7 g/dL (ref 6.5–8.1)

## 2019-02-10 LAB — CBC WITH DIFFERENTIAL/PLATELET
Abs Immature Granulocytes: 0.09 10*3/uL — ABNORMAL HIGH (ref 0.00–0.07)
Basophils Absolute: 0.1 10*3/uL (ref 0.0–0.1)
Basophils Relative: 1 %
Eosinophils Absolute: 0.2 10*3/uL (ref 0.0–0.5)
Eosinophils Relative: 2 %
HCT: 41.1 % (ref 39.0–52.0)
Hemoglobin: 13.4 g/dL (ref 13.0–17.0)
Immature Granulocytes: 1 %
Lymphocytes Relative: 14 %
Lymphs Abs: 0.9 10*3/uL (ref 0.7–4.0)
MCH: 30 pg (ref 26.0–34.0)
MCHC: 32.6 g/dL (ref 30.0–36.0)
MCV: 91.9 fL (ref 80.0–100.0)
Monocytes Absolute: 0.7 10*3/uL (ref 0.1–1.0)
Monocytes Relative: 10 %
Neutro Abs: 4.8 10*3/uL (ref 1.7–7.7)
Neutrophils Relative %: 72 %
Platelets: 154 10*3/uL (ref 150–400)
RBC: 4.47 MIL/uL (ref 4.22–5.81)
RDW: 15.6 % — ABNORMAL HIGH (ref 11.5–15.5)
WBC: 6.7 10*3/uL (ref 4.0–10.5)
nRBC: 0 % (ref 0.0–0.2)

## 2019-02-10 MED ORDER — SODIUM CHLORIDE 0.9 % IV SOLN
Freq: Once | INTRAVENOUS | Status: AC
  Administered 2019-02-10: 10:00:00 via INTRAVENOUS
  Filled 2019-02-10: qty 250

## 2019-02-10 MED ORDER — SODIUM CHLORIDE 0.9% FLUSH
10.0000 mL | Freq: Once | INTRAVENOUS | Status: AC
  Administered 2019-02-10: 10 mL via INTRAVENOUS
  Filled 2019-02-10: qty 10

## 2019-02-10 MED ORDER — HEPARIN SOD (PORK) LOCK FLUSH 100 UNIT/ML IV SOLN
500.0000 [IU] | Freq: Once | INTRAVENOUS | Status: AC | PRN
  Administered 2019-02-10: 500 [IU]
  Filled 2019-02-10: qty 5

## 2019-02-10 MED ORDER — SODIUM CHLORIDE 0.9 % IV SOLN
10.0000 mg/kg | Freq: Once | INTRAVENOUS | Status: AC
  Administered 2019-02-10: 700 mg via INTRAVENOUS
  Filled 2019-02-10: qty 10

## 2019-02-10 NOTE — Progress Notes (Signed)
Hematology/Oncology follow up note [facility] [phone] [phone]   Patient Care Team: Baxter Hire, MD as PCP - General (Internal Medicine) Sherren Mocha, MD as PCP - Cardiology (Cardiology) Telford Nab, RN as Registered Nurse  REFERRING PROVIDER: Dr.Fleming REASON FOR VISIT:  Follow-up for management of stage III non-small cell lung cancer   HISTORY OF PRESENTING ILLNESS:  Timothy Yoder is a  63 y.o.  male with PMH listed below who was referred to me for evaluation of lung mass.  Patient is a former smoker quit smoking 7 years ago.  40-pack-year. Patient chest lung cancer screening on 03/11/2018. CT scan showed 2.4 cm right lower lobe nodule with a new necrotic.  Subcarinal lymph node. PET scan showed hypermetabolic right lower pulmonary nodule, consistent with primary bronchogenic carcinoma.  Fluid density subcarinal structure demonstrate no significant hypermetabolism.  Although this could represent an incidental benign lesion such as a cyst given the interval development since 02/24/2017, necrotic adenopathy cannot be excluded and tissue sampling should be considered.  Patient was seen by Dr. Faith Rogue for initial evaluation on 03/13/2018.  Patient has not got PET scan at that time. Today patient was accompanied by his wife to the oncology clinic to discuss management plan.  And PET scan results. Patient reports chronic shortness of breath with moderate exertion.  Appetite is good denies any fever, chills, hemoptysis, weight loss.  # underwent biopsy via bronchoscopy.  Subcarinal lymph node biopsy positive for non-small cell lung cancer, favoring poorly differentiated adenocarcinoma. #Mediport was placed by Dr. Genevive Bi  # #Elevated alkaline phosphatase. Patient has been evaluated by gastroenterology.  Liver biopsy showed mild sinusoidal dilation and congestion.  Minimal changes suggestive of hepatic process. Iron stain demonstrate  minimal focal hepatocellular iron. I discussed with Dr.Wohl via secure chat about patient's pathology result.  No restriction of proceeding with immunotherapy.  Continue watch for the labs.   # Cancer Treatment 04/26/2018 -06/11/2018 Concurrent Chemotherapy [weekly Carbo and Taxol] with RT.  07/10/2018 Started on maintenance Durvalumab.  INTERVAL HISTORY [name] is a 63 y.o. male who has above history reviewed by me today presents for follow up visit for management of stage III non-small cell lung cancer.  Patient has been on maintenance immunotherapy. Patient reports feeling well at baseline. Denies fever, chills, nausea, vomiting, diarrhea, chest pain, shortness of breath, abdominal pain, urinary symptoms, lower extremity swelling.  Tolerating immunotherapy.  No new complaints today. Fatigue is chronic, no aggravating or improving factors. Patient reports feeling well at baseline today. Tolerating immunotherapy.  No new complaints today.  Review of Systems  Constitutional: Positive for fatigue. Negative for appetite change, chills, fever and unexpected weight change.  HENT:   Negative for hearing loss and voice change.   Eyes: Negative for eye problems and icterus.  Respiratory: Negative for chest tightness, cough and shortness of breath.   Cardiovascular: Negative for chest pain and leg swelling.  Gastrointestinal: Negative for abdominal distention and abdominal pain.  Endocrine: Negative for hot flashes.  Genitourinary: Negative for difficulty urinating, dysuria and frequency.   Musculoskeletal: Negative for arthralgias.  Skin: Negative for itching and rash.  Neurological: Negative for light-headedness and numbness.  Hematological: Negative for adenopathy. Does not bruise/bleed easily.  Psychiatric/Behavioral: Negative for confusion.   MEDICAL HISTORY:  Past Medical History:  Diagnosis Date   BPH (benign prostatic hyperplasia)    CAD (coronary artery disease)    a. 04/2014  anterolateral STEMI/PCI: LM nl, LAD 56m(2.75x16 Promus DES, D1  50p, LCX nl, RI nl, OM1 100 small/chronic, RCA dominant, 20-77m 763m (FFR 0.90->Med Rx), RPDA 50, RPLA 70, EF 60%.   COPD (chronic obstructive pulmonary disease) (HCC)    Dr FlRaul Del Former tobacco use    Gastroesophageal reflux disease    HTN (hypertension)    Hyperlipidemia    Hypokalemia    Malign[facility] of lung ([facility]2017   [facility]ocardial infarction ([facility]   Dr MiSherren Mocha Stented coronary artery     SURGICAL HISTORY: Past Surgical History:  Procedure Laterality Date   CARDIAC CATHETERIZATION     CHOLECYSTECTOMY N/A 02/15/2016   Procedure: LAPAROSCOPIC CHOLECYSTECTOMY WITH INTRAOPERATIVE CHOLANGIOGRAM;  Surgeon: JaLeonie GreenMD;  Location: [facility];  Service: General;  Laterality: N/A;   COLONOSCOPY     COLONOSCOPY WITH PROPOFOL N/A 08/02/2015   Procedure: COLONOSCOPY WITH PROPOFOL;  Surgeon: RoManya SilvasMD;  [facility];  Service: Endoscopy;  Laterality: N/A;   CORONARY ANGIOPLASTY  04/2014   STENT   ENDOBRONCHIAL ULTRASOUND N/A 03/26/2018   Procedure: ENDOBRONCHIAL ULTRASOUND;  Surgeon: KaFlora LippsMD;  Location: [facility];  Service: Cardiopulmonary;  Laterality: N/A;   ESOPHAGOGASTRODUODENOSCOPY (EGD) WITH PROPOFOL N/A 08/02/2015   Procedure: ESOPHAGOGASTRODUODENOSCOPY (EGD) WITH PROPOFOL;  Surgeon: RoManya SilvasMD;  [facility];  Service: Endoscopy;  Laterality: N/A;   FLEXIBLE BRONCHOSCOPY N/A 03/26/2018   Procedure: FLEXIBLE BRONCHOSCOPY;  Surgeon: KaFlora LippsMD;  Location: [facility];  Service: Cardiopulmonary;  Laterality: N/A;   KNEE ARTHROSCOPY Left    LEFT HEART CATHETERIZATION WITH CORONARY ANGIOGRAM N/A 05/03/2014   Procedure: LEFT HEART CATHETERIZATION WITH CORONARY ANGIOGRAM;  Surgeon: MiBlane OharaMD;  [facility];  Service: Cardiovascular;  Laterality: N/A;   PERCUTANEOUS CORONARY STENT INTERVENTION  (PCI-S) N/A 05/05/2014   Procedure: PERCUTANEOUS CORONARY STENT INTERVENTION (PCI-S);  Surgeon: Peter M JoMartiniqueMD;  [facility];  Service: Cardiovascular;  Laterality: N/A;   PORTACATH PLACEMENT Left 04/04/2018   Procedure: INSERTION PORT-A-CATH;  Surgeon: OaNestor LewandowskyMD;  Location: [facility];  Service: General;  Laterality: Left;    SOCIAL HISTORY: Social History   Socioeconomic History   Marital status: Married    Spouse name: Not on file   Number of children: Not on file   Years of education: Not on file   Highest education level: Not on file  Occupational History   Not on file  Social Needs   Financial resource strain: Not on file   Food insecurity    Worry: Not on file    Inability: Not on file   Transportation needs    Medical: Not on file    Non-medical: Not on file  Tobacco Use   Smoking status: Former Smoker    Packs/day: 1.00    Years: 40.00    Pack years: 40.00    Types: Cigarettes    Quit date: 02/14/2011    Years since quitting: 7.9   Smokeless tobacco: Never Used  Substance and Sexual Activity   Alcohol use: No   Drug use: No   Sexual activity: Not on file  Lifestyle   Physical activity    Days per week: Not on file    Minutes per session: Not on file   Stress: Not on file  Relationships   Social connections    Talks on phone: Not on file    Gets together: Not on file    Attends religious service: Not on file    Active  member of club or organization: Not on file    Attends meetings of clubs or organizations: Not on file    Relationship status: Not on file   Intimate partner violence    Fear of current or ex partner: Not on file    Emotionally abused: Not on file    Physically abused: Not on file    Forced sexual activity: Not on file  Other Topics Concern   Not on file  Social History Narrative   The patient is married. He lives in [place]. He works full-time. He quit smoking in 2013.    FAMILY  HISTORY: Family History  Problem Relation Age of Onset   CAD Mother 47       Died of an MI   CAD Father 62       Died of a massive MI   COPD Sister    Lung cancer Brother    Bone cancer Paternal Uncle    Colon cancer Neg Hx    Breast cancer Neg Hx     ALLERGIES:  has No Known Allergies.  MEDICATIONS:  Current Outpatient Medications  Medication Sig Dispense Refill   acetaminophen (TYLENOL) 500 MG tablet Take 1,000 mg by mouth every 6 (six) hours as needed (for pain.).     albuterol (PROAIR HFA) 108 (90 Base) MCG/ACT inhaler Inhale 2 puffs into the lungs every 6 (six) hours as needed for wheezing or shortness of breath.      aspirin EC 81 MG tablet Take 81 mg by mouth daily.     atorvastatin (LIPITOR) 80 MG tablet Take 1 tablet (80 mg total) by mouth daily at 6 PM. 90 tablet 3   carvedilol (COREG) 6.25 MG tablet Take 1 tablet (6.25 mg total) by mouth 2 (two) times daily. 180 tablet 3   colestipol (COLESTID) 1 g tablet Take 2 g by mouth as needed (for stomach issues).      folic acid (FOLVITE) 1 MG tablet Take 1 tablet (1 mg total) by mouth daily. 90 tablet 1   lidocaine-prilocaine (EMLA) cream APPLY TO AFFECTED AREA ONCE AS DIRECTED     nitroGLYCERIN (NITROSTAT) 0.4 MG SL tablet Place 1 tablet (0.4 mg total) under the tongue every 5 (five) minutes as needed for chest pain. X 3 doses 25 tablet 3   omeprazole (PRILOSEC) 40 MG capsule TAKE 1 CAPSULE BY MOUTH TWICE A DAY 180 capsule 1   ondansetron (ZOFRAN) 8 MG tablet PLEASE SEE ATTACHED FOR DETAILED DIRECTIONS     oxyCODONE-acetaminophen (PERCOCET/ROXICET) 5-325 MG tablet Take by mouth.     potassium chloride SA (KLOR-CON M20) 20 MEQ tablet Take 1 tablet (20 mEq total) by mouth daily. 90 tablet 3   sucralfate (CARAFATE) 1 g tablet Take 1 tablet (1 g total) by mouth 3 (three) times daily before meals. Dissolve in warm water, swish and swallow 90 tablet 6   tamsulosin (FLOMAX) 0.4 MG CAPS capsule Take 0.4 mg by mouth  daily after supper.     traZODone (DESYREL) 50 MG tablet Take 1 tablet (50 mg total) by mouth at bedtime. 90 tablet 0   umeclidinium-vilanterol (ANORO ELLIPTA) 62.5-25 MCG/INH AEPB Inhale 1 puff into the lungs every morning.     vitamin B-12 (CYANOCOBALAMIN) 1000 MCG tablet Take 1 tablet (1,000 mcg total) by mouth daily. 90 tablet 1   No current facility-administered medications for this visit.      PHYSICAL EXAMINATION: ECOG PERFORMANCE STATUS: 0 - Asymptomatic Vitals:   02/10/19 0921  BP:  131/82  Pulse: 67  Resp: 18  Temp: (!) 96.6 F (35.9 C)  SpO2: 100%   Filed Weights   02/10/19 0921  Weight: 152 lb 6.4 oz (69.1 kg)    Physical Exam Constitutional:      General: He is not in acute distress. HENT:     Head: Normocephalic and atraumatic.  Eyes:     General: No scleral icterus.    Pupils: Pupils are equal, round, and reactive to light.  Neck:     Musculoskeletal: Normal range of motion and neck supple.  Cardiovascular:     Rate and Rhythm: Normal rate and regular rhythm.     Heart sounds: Normal heart sounds.  Pulmonary:     Effort: Pulmonary effort is normal. No respiratory distress.     Breath sounds: No wheezing.  Abdominal:     General: Bowel sounds are normal. There is no distension.     Palpations: Abdomen is soft. There is no mass.     Tenderness: There is no abdominal tenderness.  Musculoskeletal: Normal range of motion.        General: No deformity.  Skin:    General: Skin is warm and dry.     Findings: No erythema or rash.  Neurological:     Mental Status: He is alert and oriented to person, place, and time.     Cranial Nerves: No cranial nerve deficit.     Coordination: Coordination normal.  Psychiatric:        Behavior: Behavior normal.        Thought Content: Thought content normal.      LABORATORY DATA:  I have reviewed the data as listed Lab Results  Component Value Date   WBC 6.7 02/10/2019   HGB 13.4 02/10/2019   HCT 41.1  02/10/2019   MCV 91.9 02/10/2019   PLT 154 02/10/2019   Recent Labs    01/13/19 0838 01/27/19 0856 02/10/19 0903  NA 139 139 136  K 3.3* 3.8 3.8  CL 107 105 106  CO2 _0 GLUCOSE 107* 123* 118*  BUN _1 CREATININE 0.97 0.89 1.00  CALCIUM 8.5* 8.6* 8.8*  GFRNONAA >60 >60 >60  GFRAA >60 >60 >60  PROT 6.8 6.9 7.0  ALBUMIN 3.4* 3.6 3.7  AST _2 ALT _3 ALKPHOS 197* 194* 184*  BILITOT 0.9 0.4 0.6   Iron/TIBC/Ferritin/ %Sat    Component Value Date/Time   IRON 41 (L) 11/21/2018 0856   TIBC 260 11/21/2018 0856   FERRITIN 150 11/21/2018 0856   IRONPCTSAT 16 (L) 11/21/2018 0856    RADIOGRAPHIC STUDIES: I have personally reviewed the radiological images as listed and agreed with the findings in the report.  Ct Chest W Contrast  Result Date: 01/20/2019 CLINICAL DATA:  Follow-up non-small cell lung cancer EXAM: CT CHEST WITH CONTRAST TECHNIQUE: Multidetector CT imaging of the chest was performed during intravenous contrast administration. CONTRAST:  32m OMNIPAQUE IOHEXOL 300 MG/ML  SOLN COMPARISON:  10/15/2018 FINDINGS: Cardiovascular: Heart is normal in size.  No pericardial effusion. No evidence thoracic aortic aneurysm. Atherosclerotic calcifications of the aortic arch. Coronary atherosclerosis of the LAD. Left chest port terminates in the mid SVC. Mediastinum/Nodes: No suspicious mediastinal lymphadenopathy. Visualized thyroid is unremarkable. Lungs/Pleura: Biapical pleural-parenchymal scarring. Moderate centrilobular and paraseptal emphysematous changes, upper lung predominant. Radiation changes in the medial right lower lobe (series 3/image 122), improved. Additional platelike radiation changes inferiorly in the right middle lobe. Linear scarring/atelectasis in  the medial right upper lobe. Mild subpleural patchy opacities in the lungs bilaterally with mild mosaic attenuation in the right middle and lower lobes (for example, series 3/image 70, suggesting mild  infectious/inflammatory pneumonitis. No pleural effusion or pneumothorax. Upper Abdomen: Visualized upper abdomen is notable for prior cholecystectomy and mild vascular calcifications. Musculoskeletal: Visualized osseous structures are within normal limits. IMPRESSION: Radiation changes in the medial right lower lobe, improved. No findings specific for recurrent or metastatic disease. Mild subpleural patchy opacities in the lungs bilaterally, suggesting mild infection/inflammatory pneumonitis. Aortic Atherosclerosis (ICD10-I70.0) and Emphysema (ICD10-J43.9). Electronically Signed   By: Julian Hy M.D.   On: 01/20/2019 10:47   US Biopsy (liver)  Result Date: 11/14/2018 INDICATION: 63 year old male with a history of transaminitis EXAM: ULTRASOUND-GUIDED LIVER BIOPSY MEDICAL LIVER MEDICATIONS: None. ANESTHESIA/SEDATION: Moderate (conscious) sedation was employed during this procedure. A total of Versed 2.0 mg and Fentanyl 75 mcg was administered intravenously. Moderate Sedation Time: 11 minutes. The patient's level of consciousness and vital signs were monitored continuously by radiology nursing throughout the procedure under my direct supervision. FLUOROSCOPY TIME:  None COMPLICATIONS: None PROCEDURE: The procedure, risks, benefits, and alternatives were explained to the patient. Questions regarding the procedure were encouraged and answered. The patient understands and consents to the procedure. Ultrasound survey of the right liver lobe performed with images stored and sent to PACs. The right lower thorax/right upper abdomen was prepped with chlorhexidine in a sterile fashion, and a sterile drape was applied covering the operative field. A sterile gown and sterile gloves were used for the procedure. Local anesthesia was provided with 1% Lidocaine. Once the patient is prepped and draped sterilely and the skin and subcutaneous tissues were generously infiltrated with 1% lidocaine, a small stab incision was  made with an 11 blade scalpel. A 17 gauge introducer needle was advanced under ultrasound guidance in an intercostal location into the right liver lobe. The stylet was removed, and 3 separate 18 gauge core biopsy were retrieved. Samples were placed into formalin for transportation to the lab. Gel-Foam pledgets were then infused with a small amount of saline for assistance with hemostasis. The needle was removed, and a final ultrasound image was performed. The patient tolerated the procedure well and remained hemodynamically stable throughout. No complications were encountered and no significant blood loss was encounter. IMPRESSION: Status post ultrasound-guided medical liver biopsy. Signed, Dulcy Fanny. Dellia Nims, RPVI [facility] Radiology Electronically Signed   By: Corrie Mckusick D.O.   On: 11/14/2018 12:19     ASSESSMENT & PLAN:  1. Malignant neoplasm of lower lobe of right lung ([place])   2. Encounter for antineoplastic immunotherapy   3. Elevated alkaline phosphatase level   4. Other fatigue   Cancer Staging Malignant neoplasm of lower lobe of right lung [facility]) Staging form: Lung, AJCC 8th Edition - Clinical stage from 05/13/2018: Stage IIIA (cT1c, cN2, cM0) - Signed by Earlie Server, MD on 05/13/2018  #Stage IIIA lung adenocarcinoma Labs reviewed and discussed with patient. Counts acceptable to proceed with durvalumab today. CT surveillance imaging was done on 01/20/2019.  Stable disease.  No evidence of recurrence.  Next CT due in 3 months in December.  #Elevated alkaline phosphatase, labs are reviewed and discussed with patient.  Counts are trending down. Etiology of the elevated count was not clear.  Continue monitor per GI recommendation #Chronic fatigue, at baseline.  TSH has been monitored.  Follow-up in 2 weeks for evaluation prior to next cycle of durvalumab treatments.  Earlie Server, MD, PhD 02/10/19

## 2019-02-24 ENCOUNTER — Inpatient Hospital Stay (HOSPITAL_BASED_OUTPATIENT_CLINIC_OR_DEPARTMENT_OTHER): Payer: BC Managed Care – PPO | Admitting: Oncology

## 2019-02-24 ENCOUNTER — Encounter: Payer: Self-pay | Admitting: Oncology

## 2019-02-24 ENCOUNTER — Inpatient Hospital Stay: Payer: BC Managed Care – PPO

## 2019-02-24 ENCOUNTER — Other Ambulatory Visit: Payer: Self-pay

## 2019-02-24 ENCOUNTER — Inpatient Hospital Stay: Payer: BC Managed Care – PPO | Attending: Oncology

## 2019-02-24 VITALS — BP 130/77 | HR 60 | Temp 96.9°F | Resp 18 | Wt 154.8 lb

## 2019-02-24 DIAGNOSIS — R5382 Chronic fatigue, unspecified: Secondary | ICD-10-CM | POA: Diagnosis not present

## 2019-02-24 DIAGNOSIS — C3431 Malignant neoplasm of lower lobe, right bronchus or lung: Secondary | ICD-10-CM

## 2019-02-24 DIAGNOSIS — I2510 Atherosclerotic heart disease of native coronary artery without angina pectoris: Secondary | ICD-10-CM | POA: Insufficient documentation

## 2019-02-24 DIAGNOSIS — Z5112 Encounter for antineoplastic immunotherapy: Secondary | ICD-10-CM

## 2019-02-24 DIAGNOSIS — J449 Chronic obstructive pulmonary disease, unspecified: Secondary | ICD-10-CM | POA: Diagnosis not present

## 2019-02-24 DIAGNOSIS — R748 Abnormal levels of other serum enzymes: Secondary | ICD-10-CM | POA: Insufficient documentation

## 2019-02-24 DIAGNOSIS — I10 Essential (primary) hypertension: Secondary | ICD-10-CM | POA: Insufficient documentation

## 2019-02-24 LAB — CBC WITH DIFFERENTIAL/PLATELET
Abs Immature Granulocytes: 0.05 10*3/uL (ref 0.00–0.07)
Basophils Absolute: 0.1 10*3/uL (ref 0.0–0.1)
Basophils Relative: 1 %
Eosinophils Absolute: 0.1 10*3/uL (ref 0.0–0.5)
Eosinophils Relative: 2 %
HCT: 40.1 % (ref 39.0–52.0)
Hemoglobin: 13.2 g/dL (ref 13.0–17.0)
Immature Granulocytes: 1 %
Lymphocytes Relative: 16 %
Lymphs Abs: 1 10*3/uL (ref 0.7–4.0)
MCH: 30.3 pg (ref 26.0–34.0)
MCHC: 32.9 g/dL (ref 30.0–36.0)
MCV: 92 fL (ref 80.0–100.0)
Monocytes Absolute: 0.7 10*3/uL (ref 0.1–1.0)
Monocytes Relative: 12 %
Neutro Abs: 4.2 10*3/uL (ref 1.7–7.7)
Neutrophils Relative %: 68 %
Platelets: 160 10*3/uL (ref 150–400)
RBC: 4.36 MIL/uL (ref 4.22–5.81)
RDW: 15.2 % (ref 11.5–15.5)
WBC: 6.1 10*3/uL (ref 4.0–10.5)
nRBC: 0 % (ref 0.0–0.2)

## 2019-02-24 LAB — COMPREHENSIVE METABOLIC PANEL
ALT: 31 U/L (ref 0–44)
AST: 30 U/L (ref 15–41)
Albumin: 3.7 g/dL (ref 3.5–5.0)
Alkaline Phosphatase: 160 U/L — ABNORMAL HIGH (ref 38–126)
Anion gap: 9 (ref 5–15)
BUN: 8 mg/dL (ref 8–23)
CO2: 23 mmol/L (ref 22–32)
Calcium: 8.5 mg/dL — ABNORMAL LOW (ref 8.9–10.3)
Chloride: 106 mmol/L (ref 98–111)
Creatinine, Ser: 1.02 mg/dL (ref 0.61–1.24)
GFR calc Af Amer: >60 mL/min (ref >60)
GFR calc non Af Amer: >60 mL/min (ref >60)
Glucose, Bld: 123 mg/dL — ABNORMAL HIGH (ref 70–99)
Potassium: 3.8 mmol/L (ref 3.5–5.1)
Sodium: 138 mmol/L (ref 135–145)
Total Bilirubin: 0.6 mg/dL (ref 0.3–1.2)
Total Protein: 6.5 g/dL (ref 6.5–8.1)

## 2019-02-24 MED ORDER — SODIUM CHLORIDE 0.9 % IV SOLN
10.0000 mg/kg | Freq: Once | INTRAVENOUS | Status: AC
  Administered 2019-02-24: 700 mg via INTRAVENOUS
  Filled 2019-02-24: qty 10

## 2019-02-24 MED ORDER — SODIUM CHLORIDE 0.9 % IV SOLN
Freq: Once | INTRAVENOUS | Status: AC
  Administered 2019-02-24: 11:00:00 via INTRAVENOUS
  Filled 2019-02-24: qty 250

## 2019-02-24 MED ORDER — SODIUM CHLORIDE 0.9% FLUSH
10.0000 mL | Freq: Once | INTRAVENOUS | Status: AC
  Administered 2019-02-24: 10 mL via INTRAVENOUS
  Filled 2019-02-24: qty 10

## 2019-02-24 MED ORDER — HEPARIN SOD (PORK) LOCK FLUSH 100 UNIT/ML IV SOLN
500.0000 [IU] | Freq: Once | INTRAVENOUS | Status: AC | PRN
  Administered 2019-02-24: 13:00:00 500 [IU]

## 2019-02-24 NOTE — Progress Notes (Signed)
Patient does not offer any problems today.  

## 2019-02-24 NOTE — Progress Notes (Signed)
Hematology/Oncology follow up note [facility] [phone] [phone]   Patient Care Team: Baxter Hire, MD as PCP - General (Internal Medicine) Sherren Mocha, MD as PCP - Cardiology (Cardiology) Telford Nab, RN as Registered Nurse  REFERRING PROVIDER: Dr.Fleming REASON FOR VISIT:  Follow-up for management of stage III non-small cell lung cancer   HISTORY OF PRESENTING ILLNESS:  Timothy Yoder is a  63 y.o.  male with PMH listed below who was referred to me for evaluation of lung mass.  Patient is a former smoker quit smoking 7 years ago.  40-pack-year. Patient chest lung cancer screening on 03/11/2018. CT scan showed 2.4 cm right lower lobe nodule with a new necrotic.  Subcarinal lymph node. PET scan showed hypermetabolic right lower pulmonary nodule, consistent with primary bronchogenic carcinoma.  Fluid density subcarinal structure demonstrate no significant hypermetabolism.  Although this could represent an incidental benign lesion such as a cyst given the interval development since 02/24/2017, necrotic adenopathy cannot be excluded and tissue sampling should be considered.  Patient was seen by Dr. Faith Rogue for initial evaluation on 03/13/2018.  Patient has not got PET scan at that time. Today patient was accompanied by his wife to the oncology clinic to discuss management plan.  And PET scan results. Patient reports chronic shortness of breath with moderate exertion.  Appetite is good denies any fever, chills, hemoptysis, weight loss.  # underwent biopsy via bronchoscopy.  Subcarinal lymph node biopsy positive for non-small cell lung cancer, favoring poorly differentiated adenocarcinoma. #Mediport was placed by Dr. Genevive Bi  # #Elevated alkaline phosphatase. Patient has been evaluated by gastroenterology.  Liver biopsy showed mild sinusoidal dilation and congestion.  Minimal changes suggestive of hepatic process. Iron stain demonstrate  minimal focal hepatocellular iron. I discussed with Dr.Wohl via secure chat about patient's pathology result.  No restriction of proceeding with immunotherapy.  Continue watch for the labs.   # Cancer Treatment 04/26/2018 -06/11/2018 Concurrent Chemotherapy [weekly Carbo and Taxol] with RT.  07/10/2018 Started on maintenance Durvalumab.  INTERVAL HISTORY Timothy Yoder is a 63 y.o. male who has above history reviewed by me today presents for follow up visit for management of stage III non-small cell lung cancer.  Patient has been on maintenance immunotherapy. Reports doing well at baseline.  Denies fever, chills, nausea, vomiting, diarrhea, chest pain, shortness of breath, abdominal pain, urinary symptoms, lower extremity swelling.  Fatigue is chronic.  Mild SOB with exertion.  No new complaints.  .  Review of Systems  Constitutional: Positive for fatigue. Negative for appetite change, chills, fever and unexpected weight change.  HENT:   Negative for hearing loss and voice change.   Eyes: Negative for eye problems and icterus.  Respiratory: Negative for chest tightness, cough and shortness of breath.   Cardiovascular: Negative for chest pain and leg swelling.  Gastrointestinal: Negative for abdominal distention and abdominal pain.  Endocrine: Negative for hot flashes.  Genitourinary: Negative for difficulty urinating, dysuria and frequency.   Musculoskeletal: Negative for arthralgias.  Skin: Negative for itching and rash.  Neurological: Negative for light-headedness and numbness.  Hematological: Negative for adenopathy. Does not bruise/bleed easily.  Psychiatric/Behavioral: Negative for confusion.   MEDICAL HISTORY:  Past Medical History:  Diagnosis Date   BPH (benign prostatic hyperplasia)    CAD (coronary artery disease)    a. 04/2014 anterolateral STEMI/PCI: LM nl, LAD 34m(2.75x16 Promus DES, D1 50p, LCX nl, RI nl, OM1 100 small/chronic, RCA dominant, [id number](FFR 0.90->Med  Rx),  RPDA 50, RPLA 70, EF 60%.   COPD (chronic obstructive pulmonary disease) (HCC)    Dr Raul Del   Former tobacco use    Gastroesophageal reflux disease    HTN (hypertension)    Hyperlipidemia    Hypokalemia    Malignant neoplasm of lung ([place]) 04/01/2018   Myocardial infarction ([place]) 04/2014   Dr Sherren Mocha   Stented coronary artery     SURGICAL HISTORY: Past Surgical History:  Procedure Laterality Date   CARDIAC CATHETERIZATION     CHOLECYSTECTOMY N/A 02/15/2016   Procedure: LAPAROSCOPIC CHOLECYSTECTOMY WITH INTRAOPERATIVE CHOLANGIOGRAM;  Surgeon: Leonie Green, MD;  Location: [facility] ORS;  Service: General;  Laterality: N/A;   COLONOSCOPY     COLONOSCOPY WITH PROPOFOL N/A 08/02/2015   Procedure: COLONOSCOPY WITH PROPOFOL;  Surgeon: Manya Silvas, MD;  Location: [facility] ENDOSCOPY;  Service: Endoscopy;  Laterality: N/A;   CORONARY ANGIOPLASTY  04/2014   STENT   ENDOBRONCHIAL ULTRASOUND N/A 03/26/2018   Procedure: ENDOBRONCHIAL ULTRASOUND;  Surgeon: Flora Lipps, MD;  Location: [facility] ORS;  Service: Cardiopulmonary;  Laterality: N/A;   ESOPHAGOGASTRODUODENOSCOPY (EGD) WITH PROPOFOL N/A 08/02/2015   Procedure: ESOPHAGOGASTRODUODENOSCOPY (EGD) WITH PROPOFOL;  Surgeon: Manya Silvas, MD;  Location: [facility] ENDOSCOPY;  Service: Endoscopy;  Laterality: N/A;   FLEXIBLE BRONCHOSCOPY N/A 03/26/2018   Procedure: FLEXIBLE BRONCHOSCOPY;  Surgeon: Flora Lipps, MD;  Location: [facility] ORS;  Service: Cardiopulmonary;  Laterality: N/A;   KNEE ARTHROSCOPY Left    LEFT HEART CATHETERIZATION WITH CORONARY ANGIOGRAM N/A 05/03/2014   Procedure: LEFT HEART CATHETERIZATION WITH CORONARY ANGIOGRAM;  Surgeon: Blane Ohara, MD;  Location: [facility] CATH LAB;  Service: Cardiovascular;  Laterality: N/A;   PERCUTANEOUS CORONARY STENT INTERVENTION (PCI-S) N/A 05/05/2014   Procedure: PERCUTANEOUS CORONARY STENT INTERVENTION (PCI-S);  Surgeon: Peter M Martinique, MD;  Location: [facility] CATH LAB;  Service:  Cardiovascular;  Laterality: N/A;   PORTACATH PLACEMENT Left 04/04/2018   Procedure: INSERTION PORT-A-CATH;  Surgeon: [name], MD;  Location: [facility] ORS;  Service: General;  Laterality: Left;    SOCIAL HISTORY: Social History   Socioeconomic History   Marital status: Married    Spouse name: Not on file   Number of children: Not on file   Years of education: Not on file   Highest education level: Not on file  Occupational History   Not on file  Social Needs   Financial resource strain: Not on file   Food insecurity    Worry: Not on file    Inability: Not on file   Transportation needs    Medical: Not on file    Non-medical: Not on file  Tobacco Use   Smoking status: Former Smoker    Packs/day: 1.00    Years: 40.00    Pack years: 40.00    Types: Cigarettes    Quit date: 02/14/2011    Years since quitting: 8.0   Smokeless tobacco: Never Used  Substance and Sexual Activity   Alcohol use: No   Drug use: No   Sexual activity: Not on file  Lifestyle   Physical activity    Days per week: Not on file    Minutes per session: Not on file   Stress: Not on file  Relationships   Social connections    Talks on phone: Not on file    Gets together: Not on file    Attends religious service: Not on file    Active member of club or organization: Not on file    Attends meetings of clubs  or organizations: Not on file    Relationship status: Not on file   Intimate partner violence    Fear of current or ex partner: Not on file    Emotionally abused: Not on file    Physically abused: Not on file    Forced sexual activity: Not on file  Other Topics Concern   Not on file  Social History Narrative   The patient is married. He lives in [place]. He works full-time. He quit smoking in 2013.    FAMILY HISTORY: Family History  Problem Relation Age of Onset   CAD Mother 58       Died of an MI   CAD Father 64       Died of a massive MI   COPD Sister     Lung cancer Brother    Bone cancer Paternal Uncle    Colon cancer Neg Hx    Breast cancer Neg Hx     ALLERGIES:  has No Known Allergies.  MEDICATIONS:  Current Outpatient Medications  Medication Sig Dispense Refill   acetaminophen (TYLENOL) 500 MG tablet Take 1,000 mg by mouth every 6 (six) hours as needed (for pain.).     albuterol (PROAIR HFA) 108 (90 Base) MCG/ACT inhaler Inhale 2 puffs into the lungs every 6 (six) hours as needed for wheezing or shortness of breath.      aspirin EC 81 MG tablet Take 81 mg by mouth daily.     atorvastatin (LIPITOR) 80 MG tablet Take 1 tablet (80 mg total) by mouth daily at 6 PM. 90 tablet 3   carvedilol (COREG) 6.25 MG tablet Take 1 tablet (6.25 mg total) by mouth 2 (two) times daily. 180 tablet 3   colestipol (COLESTID) 1 g tablet Take 2 g by mouth as needed (for stomach issues).      folic acid (FOLVITE) 1 MG tablet Take 1 tablet (1 mg total) by mouth daily. 90 tablet 1   lidocaine-prilocaine (EMLA) cream APPLY TO AFFECTED AREA ONCE AS DIRECTED     nitroGLYCERIN (NITROSTAT) 0.4 MG SL tablet Place 1 tablet (0.4 mg total) under the tongue every 5 (five) minutes as needed for chest pain. X 3 doses 25 tablet 3   omeprazole (PRILOSEC) 40 MG capsule TAKE 1 CAPSULE BY MOUTH TWICE A DAY 180 capsule 1   ondansetron (ZOFRAN) 8 MG tablet PLEASE SEE ATTACHED FOR DETAILED DIRECTIONS     oxyCODONE-acetaminophen (PERCOCET/ROXICET) 5-325 MG tablet Take by mouth.     potassium chloride SA (KLOR-CON M20) 20 MEQ tablet Take 1 tablet (20 mEq total) by mouth daily. 90 tablet 3   sucralfate (CARAFATE) 1 g tablet Take 1 tablet (1 g total) by mouth 3 (three) times daily before meals. Dissolve in warm water, swish and swallow 90 tablet 6   tamsulosin (FLOMAX) 0.4 MG CAPS capsule Take 0.4 mg by mouth daily after supper.     traZODone (DESYREL) 50 MG tablet Take 1 tablet (50 mg total) by mouth at bedtime. 90 tablet 0   umeclidinium-vilanterol (ANORO ELLIPTA)  62.5-25 MCG/INH AEPB Inhale 1 puff into the lungs every morning.     vitamin B-12 (CYANOCOBALAMIN) 1000 MCG tablet Take 1 tablet (1,000 mcg total) by mouth daily. 90 tablet 1   No current facility-administered medications for this visit.      PHYSICAL EXAMINATION: ECOG PERFORMANCE STATUS: 0 - Asymptomatic Vitals:   02/24/19 0911  BP: 130/77  Pulse: 60  Resp: 18  Temp: (!) 96.9 F (36.1 C)  Filed Weights   02/24/19 0911  Weight: 154 lb 12.8 oz (70.2 kg)    Physical Exam Constitutional:      General: He is not in acute distress. HENT:     Head: Normocephalic and atraumatic.  Eyes:     General: No scleral icterus.    Pupils: Pupils are equal, round, and reactive to light.  Neck:     Musculoskeletal: Normal range of motion and neck supple.  Cardiovascular:     Rate and Rhythm: Normal rate and regular rhythm.     Heart sounds: Normal heart sounds.  Pulmonary:     Effort: Pulmonary effort is normal. No respiratory distress.     Breath sounds: No wheezing.  Abdominal:     General: Bowel sounds are normal. There is no distension.     Palpations: Abdomen is soft. There is no mass.     Tenderness: There is no abdominal tenderness.  Musculoskeletal: Normal range of motion.        General: No deformity.  Skin:    General: Skin is warm and dry.     Findings: No erythema or rash.  Neurological:     Mental Status: He is alert and oriented to person, place, and time.     Cranial Nerves: No cranial nerve deficit.     Coordination: Coordination normal.  Psychiatric:        Behavior: Behavior normal.        Thought Content: Thought content normal.      LABORATORY DATA:  I have reviewed the data as listed Lab Results  Component Value Date   WBC 6.1 02/24/2019   HGB 13.2 02/24/2019   HCT 40.1 02/24/2019   MCV 92.0 02/24/2019   PLT 160 02/24/2019   Recent Labs    01/27/19 0856 02/10/19 0903 02/24/19 0856  NA 139 136 138  K 3.8 3.8 3.8  CL 105 106 106  CO2 _0 GLUCOSE 123* 118* 123*  BUN _1 CREATININE 0.89 1.00 1.02  CALCIUM 8.6* 8.8* 8.5*  GFRNONAA >60 >60 >60  GFRAA >60 >60 >60  PROT 6.9 7.0 6.5  ALBUMIN 3.6 3.7 3.7  AST _2 ALT _3 ALKPHOS 194* 184* 160*  BILITOT 0.4 0.6 0.6   Iron/TIBC/Ferritin/ %Sat    Component Value Date/Time   IRON 41 (L) 11/21/2018 0856   TIBC 260 11/21/2018 0856   FERRITIN 150 11/21/2018 0856   IRONPCTSAT 16 (L) 11/21/2018 0856    RADIOGRAPHIC STUDIES: I have personally reviewed the radiological images as listed and agreed with the findings in the report.  Ct Chest W Contrast  Result Date: 01/20/2019 CLINICAL DATA:  Follow-up non-small cell lung cancer EXAM: CT CHEST WITH CONTRAST TECHNIQUE: Multidetector CT imaging of the chest was performed during intravenous contrast administration. CONTRAST:  33m OMNIPAQUE IOHEXOL 300 MG/ML  SOLN COMPARISON:  10/15/2018 FINDINGS: Cardiovascular: Heart is normal in size.  No pericardial effusion. No evidence thoracic aortic aneurysm. Atherosclerotic calcifications of the aortic arch. Coronary atherosclerosis of the LAD. Left chest port terminates in the mid SVC. Mediastinum/Nodes: No suspicious mediastinal lymphadenopathy. Visualized thyroid is unremarkable. Lungs/Pleura: Biapical pleural-parenchymal scarring. Moderate centrilobular and paraseptal emphysematous changes, upper lung predominant. Radiation changes in the medial right lower lobe (series 3/image 122), improved. Additional platelike radiation changes inferiorly in the right middle lobe. Linear scarring/atelectasis in the medial right upper lobe. Mild subpleural patchy opacities in the lungs bilaterally with mild mosaic attenuation in the  right middle and lower lobes (for example, series 3/image 70, suggesting mild infectious/inflammatory pneumonitis. No pleural effusion or pneumothorax. Upper Abdomen: Visualized upper abdomen is notable for prior cholecystectomy and mild vascular calcifications.  Musculoskeletal: Visualized osseous structures are within normal limits. IMPRESSION: Radiation changes in the medial right lower lobe, improved. No findings specific for recurrent or metastatic disease. Mild subpleural patchy opacities in the lungs bilaterally, suggesting mild infection/inflammatory pneumonitis. Aortic Atherosclerosis (ICD10-I70.0) and Emphysema (ICD10-J43.9). Electronically Signed   By: Julian Hy M.D.   On: 01/20/2019 10:47     ASSESSMENT & PLAN:  1. Malignant neoplasm of lower lobe of right lung ([place])   2. Encounter for antineoplastic immunotherapy   3. Elevated alkaline phosphatase level   4. Chronic obstructive pulmonary disease, unspecified COPD type ([place])   Cancer Staging Malignant neoplasm of lower lobe of right lung [facility]) Staging form: Lung, AJCC 8th Edition - Clinical stage from 05/13/2018: Stage IIIA (cT1c, cN2, cM0) - Signed by Earlie Server, MD on 05/13/2018  #Stage IIIA lung adenocarcinoma Labs are reviewed and discussed with patient. Counts acceptable to proceed with durvalumab today. CT surveillance imaging was done on 01/20/2019.  Stable disease.  No evidence of recurrence.  Next CT due in 3 months in December.  #Elevated alkaline phosphatase, counts are trending down. #Chronic fatigue, TSH has been monitored. #COPD patient uses inhalers.  Recommend patient to follow-up with pulmonology. Follow-up in 2 weeks for evaluation prior to next cycle of durvalumab treatments.   Earlie Server, MD, PhD 02/24/19

## 2019-02-25 ENCOUNTER — Other Ambulatory Visit: Payer: Self-pay | Admitting: *Deleted

## 2019-02-25 MED ORDER — FOLIC ACID 1 MG PO TABS: 1.0000 mg | ORAL_TABLET | Freq: Every day | ORAL | 1 refills | Status: DC

## 2019-02-27 ENCOUNTER — Encounter: Payer: Self-pay | Admitting: Oncology

## 2019-02-27 ENCOUNTER — Other Ambulatory Visit: Payer: Self-pay | Admitting: *Deleted

## 2019-02-27 MED ORDER — VITAMIN B-12 1000 MCG PO TABS: 1000.0000 ug | ORAL_TABLET | Freq: Every day | ORAL | 1 refills | Status: DC

## 2019-02-27 MED ORDER — LIDOCAINE-PRILOCAINE 2.5-2.5 % EX CREA: TOPICAL_CREAM | CUTANEOUS | 1 refills | Status: DC

## 2019-03-04 ENCOUNTER — Ambulatory Visit (INDEPENDENT_AMBULATORY_CARE_PROVIDER_SITE_OTHER): Payer: BC Managed Care – PPO | Admitting: Adult Health

## 2019-03-04 ENCOUNTER — Other Ambulatory Visit: Payer: Self-pay

## 2019-03-04 ENCOUNTER — Encounter: Payer: Self-pay | Admitting: Adult Health

## 2019-03-04 DIAGNOSIS — J449 Chronic obstructive pulmonary disease, unspecified: Secondary | ICD-10-CM

## 2019-03-04 DIAGNOSIS — C3431 Malignant neoplasm of lower lobe, right bronchus or lung: Secondary | ICD-10-CM | POA: Diagnosis not present

## 2019-03-04 MED ORDER — ANORO ELLIPTA 62.5-25 MCG/INH IN AEPB: 1.0000 | INHALATION_SPRAY | Freq: Every day | RESPIRATORY_TRACT | 0 refills | Status: AC

## 2019-03-04 MED ORDER — ANORO ELLIPTA 62.5-25 MCG/INH IN AEPB: 1.0000 | INHALATION_SPRAY | Freq: Every day | RESPIRATORY_TRACT | 1 refills | Status: DC

## 2019-03-04 NOTE — Addendum Note (Signed)
Addended by: Maryanna Shape A on: 03/04/2019 02:53 PM   Modules accepted: Orders

## 2019-03-04 NOTE — Patient Instructions (Addendum)
Continue on ANORO 1 puff daily , rinse after use.  Continue activity as tolerated.  High protein diet.  Check on Pneumonia vaccine.  Follow up with Oncology as planned  Follow up with Dr. Mortimer Fries in 6 months and As needed

## 2019-03-04 NOTE — Assessment & Plan Note (Signed)
Moderate COPD appears to be stable.  Symptom burden seems to be low to moderate. Have encouraged him with activity as tolerated.  High-protein diet. Continue with follow-up with oncology  Plan  Patient Instructions  Continue on ANORO 1 puff daily , rinse after use.  Continue activity as tolerated.  High protein diet.  Check on Pneumonia vaccine.  Follow up with Oncology as planned  Follow up with Dr. Mortimer Fries in 6 months and As needed

## 2019-03-04 NOTE — Progress Notes (Signed)
@Patient  ID: Timothy Yoder, male    DOB: 08/21/1955, 63 y.o.   MRN: [id number]  Chief Complaint  Patient presents with  . 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 New 2.4 cm right lower lobe nodule with a new necrotic appearing subcarinal lymph node. Lung-RADS 4B, suspicious. Additional imaging evaluation or consultation with Pulmonology or Thoracic Surgery recommended. These results will be called to the ordering clinician or representative by the Radiologist Assistant, and communication documented in the PACS or zVision Dashboard. 2. Aortic atherosclerosis (ICD10-170.0). Coronary arterycalcification.3. Emphysema (ICD10-J43.9).  03/13/2018 PET scan #Hypermetabolic right lower lobe pulmonary nodule, consistent with primary bronchogenic carcinoma. Fluid density subcarinal structure demonstrates no significant hypermetabolism. Although this could represent an incidental benign lesion such as a bronchogenic cyst, given interval development since 02/24/2017, necrotic adenopathy cannot be excluded and tissue sampling should be considered.  03/04/2019 Follow up : COPD , Lung Cancer  Patient returns for a 1 year follow-up.  Patient has underlying moderate COPD is on Anoro daily.  Says overall is doing okay .  Trying to be more active. Activity level is improving . Is doing light yard work .  Weight is improving . Appetite is improved.  Tolerating immunotherapy currently .  No flare of cough or wheezing . No hemoptysis .  Gets winded with heavy activity . Rare albuterol use .   Retired.   Patient was diagnosed with adenocarcinoma of the lung in December 2019.  He underwent concurrent chemotherapy in January and  February 2020 .  Completed XRT.  Started on durvalumab March 2020 every other week.    Serial CT chest most recent January 20, 2019 showed radiation changes in the medial right lower lobe improved no findings specific for recurrent or metastatic disease.   No Known Allergies  Immunization History  Administered Date(s) Administered  . Influenza,inj,Quad PF,6+ Mos 01/27/2019  . Influenza-Unspecified 02/26/2018    Past Medical History:  Diagnosis Date  . BPH (benign prostatic hyperplasia)   . CAD (coronary artery disease)    a. 04/2014 anterolateral STEMI/PCI: LM nl, LAD 38m (2.75x16 Promus DES, D1 50p, LCX nl, RI nl, OM1 100 small/chronic, RCA dominant, 20-51m, 25m/d (FFR 0.90->Med Rx), RPDA 50, RPLA 70, EF 60%.  Marland Kitchen COPD (chronic obstructive pulmonary disease) (HCC)    Dr Raul Del  . Former tobacco use   . Gastroesophageal reflux disease   . HTN (hypertension)   . Hyperlipidemia   . Hypokalemia   . Malignant neoplasm of lung ([place]) 04/01/2018  . Myocardial infarction ([place]) 04/2014   Dr Sherren Mocha  . Stented coronary artery     Tobacco History: Social History   Tobacco Use  Smoking Status Former Smoker  . Packs/day: 1.00  . Years: 40.00  . Pack years: 40.00  . Types: Cigarettes  . Quit date: 02/14/2011  . Years since quitting: 8.0  Smokeless Tobacco Never Used   Counseling given: Not Answered   Outpatient Medications Prior to Visit  Medication Sig Dispense Refill  . acetaminophen (TYLENOL) 500 MG tablet Take 1,000 mg by mouth every 6 (six) hours as needed (for pain.).    Marland Kitchen albuterol (PROAIR HFA) 108 (90 Base) MCG/ACT inhaler Inhale 2 puffs into  the lungs every 6 (six) hours as needed for wheezing or shortness of breath.     Marland Kitchen aspirin EC 81 MG tablet Take 81 mg by mouth daily.    Marland Kitchen atorvastatin (LIPITOR) 80 MG tablet Take 1 tablet (80 mg total) by mouth daily at 6 PM. 90 tablet 3  . carvedilol (COREG) 6.25 MG tablet Take 1 tablet (6.25 mg total) by mouth 2 (two) times  daily. 180 tablet 3  . colestipol (COLESTID) 1 g tablet Take 2 g by mouth as needed (for stomach issues).     . folic acid (FOLVITE) 1 MG tablet Take 1 tablet (1 mg total) by mouth daily. 90 tablet 1  . lidocaine-prilocaine (EMLA) cream Apply small amount to port site 1 hour prior to [facility] being accessed and cover with plastic wrap 30 g 1  . nitroGLYCERIN (NITROSTAT) 0.4 MG SL tablet Place 1 tablet (0.4 mg total) under the tongue every 5 (five) minutes as needed for chest pain. X 3 doses 25 tablet 3  . omeprazole (PRILOSEC) 40 MG capsule TAKE 1 CAPSULE BY MOUTH TWICE A DAY 180 capsule 1  . ondansetron (ZOFRAN) 8 MG tablet PLEASE SEE ATTACHED FOR DETAILED DIRECTIONS    . potassium chloride SA (KLOR-CON M20) 20 MEQ tablet Take 1 tablet (20 mEq total) by mouth daily. 90 tablet 3  . sucralfate (CARAFATE) 1 g tablet Take 1 tablet (1 g total) by mouth 3 (three) times daily before meals. Dissolve in warm water, swish and swallow 90 tablet 6  . tamsulosin (FLOMAX) 0.4 MG CAPS capsule Take 0.4 mg by mouth daily after supper.    . traZODone (DESYREL) 50 MG tablet Take 1 tablet (50 mg total) by mouth at bedtime. 90 tablet 0  . umeclidinium-vilanterol (ANORO ELLIPTA) 62.5-25 MCG/INH AEPB Inhale 1 puff into the lungs every morning.    . vitamin B-12 (CYANOCOBALAMIN) 1000 MCG tablet Take 1 tablet (1,000 mcg total) by mouth daily. 90 tablet 1  . oxyCODONE-acetaminophen (PERCOCET/ROXICET) 5-325 MG tablet Take by mouth.     No facility-administered medications prior to visit.      Review of Systems:   Constitutional:   No  weight loss, night sweats,  Fevers, chills, fatigue, or  lassitude.  HEENT:   No headaches,  Difficulty swallowing,  Tooth/dental problems, or  Sore throat,                No sneezing, itching, ear ache, nasal congestion, post nasal drip,   CV:  No chest pain,  Orthopnea, PND, swelling in lower extremities, anasarca, dizziness, palpitations, syncope.   GI  No heartburn, indigestion,  abdominal pain, nausea, vomiting, diarrhea, change in bowel habits, loss of appetite, bloody stools.   Resp:  No excess mucus, no productive cough,  No non-productive cough,  No coughing up of blood.  No change in color of mucus.  No wheezing.  No chest wall deformity  Skin: no rash or lesions.  GU: no dysuria, change in color of urine, no urgency or frequency.  No flank pain, no hematuria   MS:  No joint pain or swelling.  No decreased range of motion.  No back pain.    Physical Exam  BP 130/70 (BP Location: Left Arm, Cuff Size: Normal)   Pulse 90   Temp 97.6 F (36.4 C) (Temporal)   Ht 5\' 8"  (1.727 m)   Wt 155 lb 6.4 oz (70.5 kg)   SpO2 97%   BMI 23.63 kg/m   GEN: A/Ox3; pleasant ,  NAD, well nourished    HEENT:  [place]/AT,   , NOSE-clear, THROAT-clear, no lesions, no postnasal drip or exudate noted.   NECK:  Supple w/ fair ROM; no JVD; normal carotid impulses w/o bruits; no thyromegaly or nodules palpated; no lymphadenopathy.    RESP  Clear  P & A; w/o, wheezes/ rales/ or rhonchi. no accessory muscle use, no dullness to percussion  CARD:  RRR, no m/r/g, no peripheral edema, pulses intact, no cyanosis or clubbing.  GI:   Soft & nt; nml bowel sounds; no organomegaly or masses detected.   Musco: Warm bil, no deformities or joint swelling noted.   Neuro: alert, no focal deficits noted.    Skin: Warm, no lesions or rashes    Lab Results:  CBC  BMET  ProBNP No results found for: PROBNP  Imaging: No results found.  durvalumab (IMFINZI) 700 mg in sodium chloride 0.9 % 100 mL chemo infusion    Date Action Dose Route User   01/13/2019 1104 New Bag/Given 700 mg Intravenous Harlow Asa, RN    durvalumab (IMFINZI) 700 mg in sodium chloride 0.9 % 100 mL chemo infusion    Date Action Dose Route User   01/27/2019 1156 Rate/Dose Change (none) (none) Johnsie Cancel, RN   01/27/2019 1155 Rate/Dose Change (none) (none) Johnsie Cancel, RN   01/27/2019 1055  Rate/Dose Change (none) (none) Johnsie Cancel, RN   01/27/2019 1055 Rate/Dose Change (none) (none) Johnsie Cancel, RN   01/27/2019 1055 New Bag/Given 700 mg Intravenous Johnsie Cancel, RN    durvalumab (IMFINZI) 700 mg in sodium chloride 0.9 % 100 mL chemo infusion    Date Action Dose Route User   02/10/2019 1202 Rate/Dose Change (none) (none) Simser, Gaynell Face, RN   02/10/2019 1202 Rate/Dose Change (none) (none) Simser, Gaynell Face, RN   02/10/2019 1106 Rate/Dose Verify (none) (none) Simser, Gaynell Face, RN   02/10/2019 1102 New Bag/Given 700 mg Intravenous Simser, Kendra P, RN    durvalumab (IMFINZI) 700 mg in sodium chloride 0.9 % 100 mL chemo infusion    Date Action Dose Route User   02/24/2019 1123 New Bag/Given 700 mg Intravenous Theodosia Blender, RN    heparin lock flush 100 unit/mL    Date Action Dose Route User   01/13/2019 1210 Given 500 Units Intravenous Clent Demark T, RN    heparin lock flush 100 unit/mL    Date Action Dose Route User   01/27/2019 1214 Given 500 Units Intracatheter Johnsie Cancel, RN    heparin lock flush 100 unit/mL    Date Action Dose Route User   02/10/2019 1220 Given 500 Units Intracatheter Simser, Gaynell Face, RN    heparin lock flush 100 unit/mL    Date Action Dose Route User   02/24/2019 1230 Given 500 Units Intracatheter Sinclair Grooms D, RN    influenza vac split quadrivalent PF (FLUARIX) injection 0.5 mL    Date Action Dose Route User   01/27/2019 1217 Given 0.5 mL Intramuscular (Right Deltoid) Johnsie Cancel, RN    0.9 %  sodium chloride infusion    Date Action Dose Route User   01/13/2019 1011 New Bag/Given (none) Intravenous Harlow Asa, RN    0.9 %  sodium chloride infusion    Date Action Dose Route User   01/27/2019 1211 Rate/Dose Change (none) (none) Johnsie Cancel, RN   01/27/2019 1205 Rate/Dose Change (none) (none) Johnsie Cancel, RN   01/27/2019 1020  Rate/Dose Verify (none) (none) Johnsie Cancel,  RN   01/27/2019 1018 New Bag/Given (none) Intravenous Johnsie Cancel, RN    0.9 %  sodium chloride infusion    Date Action Dose Route User   02/10/2019 1220 Rate/Dose Change (none) (none) Simser, Gaynell Face, RN   02/10/2019 1218 Rate/Dose Change (none) (none) Simser, Gaynell Face, RN   02/10/2019 1213 Rate/Dose Change (none) (none) Simser, Gaynell Face, RN   02/10/2019 1006 Rate/Dose Verify (none) (none) Simser, Gaynell Face, RN   02/10/2019 1005 New Bag/Given (none) Intravenous Simser, [employer], RN    0.9 %  sodium chloride infusion    Date Action Dose Route User   02/24/2019 1034 New Bag/Given (none) Intravenous Theodosia Blender, RN    sodium chloride flush (NS) 0.9 % injection 10 mL    Date Action Dose Route User   01/13/2019 0846 Given 10 mL Intravenous Wittenbrook, Lauren H, RN    sodium chloride flush (NS) 0.9 % injection 10 mL    Date Action Dose Route User   01/27/2019 0859 Given 10 mL Intravenous Mallie Snooks I, RN    sodium chloride flush (NS) 0.9 % injection 10 mL    Date Action Dose Route User   02/10/2019 0910 Given 10 mL Intravenous Mallie Snooks I, RN    sodium chloride flush (NS) 0.9 % injection 10 mL    Date Action Dose Route User   02/24/2019 0901 Given 10 mL Intravenous Wittenbrook, Tomma Rakers, RN      No flowsheet data found.  No results found for: NITRICOXIDE      Assessment & Plan:   COPD (chronic obstructive pulmonary disease) (HCC) Moderate COPD appears to be stable.  Symptom burden seems to be low to moderate. Have encouraged him with activity as tolerated.  High-protein diet. Continue with follow-up with oncology  Plan  Patient Instructions  Continue on ANORO 1 puff daily , rinse after use.  Continue activity as tolerated.  High protein diet.  Check on Pneumonia vaccine.  Follow up with Oncology as planned  Follow up with Dr. Mortimer Fries in 6 months and As needed        Malignant neoplasm of lower lobe of right lung [facility]) Right lower lobe lung  adenocarcinoma diagnosed December 2019 status post concurrent chemotherapy and radiation Currently on immunotherapy with durvalumab [place] follow-up with oncology.  Most recent serial CT chest showed radiation changes and no evidence of recurrent disease.     Rexene Edison, NP 03/04/2019

## 2019-03-04 NOTE — Assessment & Plan Note (Signed)
Right lower lobe lung adenocarcinoma diagnosed December 2019 status post concurrent chemotherapy and radiation Currently on immunotherapy with durvalumab [place] follow-up with oncology.  Most recent serial CT chest showed radiation changes and no evidence of recurrent disease.

## 2019-03-07 ENCOUNTER — Other Ambulatory Visit: Payer: Self-pay

## 2019-03-07 NOTE — Progress Notes (Signed)
Patient pre screened for office appointment, no questions or concerns today. Patient reminded of upcoming appointment time and date. 

## 2019-03-10 ENCOUNTER — Inpatient Hospital Stay: Payer: BC Managed Care – PPO

## 2019-03-10 ENCOUNTER — Encounter: Payer: Self-pay | Admitting: Oncology

## 2019-03-10 ENCOUNTER — Other Ambulatory Visit: Payer: Self-pay

## 2019-03-10 ENCOUNTER — Inpatient Hospital Stay (HOSPITAL_BASED_OUTPATIENT_CLINIC_OR_DEPARTMENT_OTHER): Payer: BC Managed Care – PPO | Admitting: Oncology

## 2019-03-10 VITALS — BP 133/86 | HR 63 | Temp 95.4°F | Resp 18 | Wt 154.8 lb

## 2019-03-10 DIAGNOSIS — R748 Abnormal levels of other serum enzymes: Secondary | ICD-10-CM | POA: Diagnosis not present

## 2019-03-10 DIAGNOSIS — Z5112 Encounter for antineoplastic immunotherapy: Secondary | ICD-10-CM

## 2019-03-10 DIAGNOSIS — C3431 Malignant neoplasm of lower lobe, right bronchus or lung: Secondary | ICD-10-CM

## 2019-03-10 DIAGNOSIS — J449 Chronic obstructive pulmonary disease, unspecified: Secondary | ICD-10-CM

## 2019-03-10 LAB — CBC WITH DIFFERENTIAL/PLATELET
Abs Immature Granulocytes: 0.05 10*3/uL (ref 0.00–0.07)
Basophils Absolute: 0.1 10*3/uL (ref 0.0–0.1)
Basophils Relative: 1 %
Eosinophils Absolute: 0.1 10*3/uL (ref 0.0–0.5)
Eosinophils Relative: 2 %
HCT: 41.7 % (ref 39.0–52.0)
Hemoglobin: 14 g/dL (ref 13.0–17.0)
Immature Granulocytes: 1 %
Lymphocytes Relative: 23 %
Lymphs Abs: 1.2 10*3/uL (ref 0.7–4.0)
MCH: 30.7 pg (ref 26.0–34.0)
MCHC: 33.6 g/dL (ref 30.0–36.0)
MCV: 91.4 fL (ref 80.0–100.0)
Monocytes Absolute: 0.6 10*3/uL (ref 0.1–1.0)
Monocytes Relative: 11 %
Neutro Abs: 3.4 10*3/uL (ref 1.7–7.7)
Neutrophils Relative %: 62 %
Platelets: 175 10*3/uL (ref 150–400)
RBC: 4.56 MIL/uL (ref 4.22–5.81)
RDW: 14.7 % (ref 11.5–15.5)
WBC: 5.4 10*3/uL (ref 4.0–10.5)
nRBC: 0 % (ref 0.0–0.2)

## 2019-03-10 LAB — COMPREHENSIVE METABOLIC PANEL
ALT: 29 U/L (ref 0–44)
AST: 26 U/L (ref 15–41)
Albumin: 3.9 g/dL (ref 3.5–5.0)
Alkaline Phosphatase: 146 U/L — ABNORMAL HIGH (ref 38–126)
Anion gap: 4 — ABNORMAL LOW (ref 5–15)
BUN: 8 mg/dL (ref 8–23)
CO2: 25 mmol/L (ref 22–32)
Calcium: 8.6 mg/dL — ABNORMAL LOW (ref 8.9–10.3)
Chloride: 105 mmol/L (ref 98–111)
Creatinine, Ser: 1.03 mg/dL (ref 0.61–1.24)
GFR calc Af Amer: >60 mL/min (ref >60)
GFR calc non Af Amer: >60 mL/min (ref >60)
Glucose, Bld: 100 mg/dL — ABNORMAL HIGH (ref 70–99)
Potassium: 3.8 mmol/L (ref 3.5–5.1)
Sodium: 134 mmol/L — ABNORMAL LOW (ref 135–145)
Total Bilirubin: 0.8 mg/dL (ref 0.3–1.2)
Total Protein: 6.7 g/dL (ref 6.5–8.1)

## 2019-03-10 MED ORDER — SODIUM CHLORIDE 0.9% FLUSH
10.0000 mL | INTRAVENOUS | Status: DC | PRN
  Administered 2019-03-10: 10 mL via INTRAVENOUS
  Filled 2019-03-10: qty 10

## 2019-03-10 MED ORDER — ANORO ELLIPTA 62.5-25 MCG/INH IN AEPB: 1.0000 | INHALATION_SPRAY | Freq: Every day | RESPIRATORY_TRACT | 1 refills | Status: AC

## 2019-03-10 MED ORDER — SODIUM CHLORIDE 0.9 % IV SOLN
Freq: Once | INTRAVENOUS | Status: AC
  Administered 2019-03-10: 10:00:00 via INTRAVENOUS
  Filled 2019-03-10: qty 250

## 2019-03-10 MED ORDER — HEPARIN SOD (PORK) LOCK FLUSH 100 UNIT/ML IV SOLN
500.0000 [IU] | Freq: Once | INTRAVENOUS | Status: AC
  Administered 2019-03-10: 500 [IU] via INTRAVENOUS
  Filled 2019-03-10: qty 5

## 2019-03-10 MED ORDER — SODIUM CHLORIDE 0.9 % IV SOLN
10.0000 mg/kg | Freq: Once | INTRAVENOUS | Status: AC
  Administered 2019-03-10: 700 mg via INTRAVENOUS
  Filled 2019-03-10: qty 10

## 2019-03-10 NOTE — Addendum Note (Signed)
Addended by: Maryanna Shape A on: 03/10/2019 10:42 AM   Modules accepted: Orders

## 2019-03-10 NOTE — Progress Notes (Signed)
Hematology/Oncology follow up note [facility] Telephone:([phone] Fax:(336) [phone]   Patient Care Team: Baxter Hire, MD as PCP - General (Internal Medicine) Sherren Mocha, MD as PCP - Cardiology (Cardiology) Telford Nab, RN as Registered Nurse  REFERRING PROVIDER: Dr.Fleming REASON FOR VISIT:  Follow-up for management of stage III non-small cell lung cancer   HISTORY OF PRESENTING ILLNESS:  Timothy Yoder is a  63 y.o.  male with PMH listed below who was referred to me for evaluation of lung mass.  Patient is a former smoker quit smoking 7 years ago.  40-pack-year. Patient chest lung cancer screening on 03/11/2018. CT scan showed 2.4 cm right lower lobe nodule with a new necrotic.  Subcarinal lymph node. PET scan showed hypermetabolic right lower pulmonary nodule, consistent with primary bronchogenic carcinoma.  Fluid density subcarinal structure demonstrate no significant hypermetabolism.  Although this could represent an incidental benign lesion such as a cyst given the interval development since 02/24/2017, necrotic adenopathy cannot be excluded and tissue sampling should be considered.  Patient was seen by Dr. Faith Rogue for initial evaluation on 03/13/2018.  Patient has not got PET scan at that time. Today patient was accompanied by his wife to the oncology clinic to discuss management plan.  And PET scan results. Patient reports chronic shortness of breath with moderate exertion.  Appetite is good denies any fever, chills, hemoptysis, weight loss.  # underwent biopsy via bronchoscopy.  Subcarinal lymph node biopsy positive for non-small cell lung cancer, favoring poorly differentiated adenocarcinoma. #Mediport was placed by Dr. Genevive Bi  # #Elevated alkaline phosphatase. Patient has been evaluated by gastroenterology.  Liver biopsy showed mild sinusoidal dilation and congestion.  Minimal changes suggestive of hepatic process. Iron stain demonstrate  minimal focal hepatocellular iron. I discussed with Dr.Wohl via secure chat about patient's pathology result.  No restriction of proceeding with immunotherapy.  Continue watch for the labs.   # Cancer Treatment 04/26/2018 -06/11/2018 Concurrent Chemotherapy [weekly Carbo and Taxol] with RT.  07/10/2018 Started on maintenance Durvalumab.  INTERVAL HISTORY Timothy Yoder is a 63 y.o. male who has above history reviewed by me today presents for follow up visit for management of stage III non-small cell lung cancer.  Patient has been on maintenance immunotherapy. Patient reports doing well. Denies any fever, chills, nausea, vomiting, diarrhea, chest pain, shortness of breath, abdominal pain, urinary symptoms or lower extremity swelling. Fatigue is chronic and unchanged. Mild shortness of breath on exertion, chronic.  No new complaints.  .  Review of Systems  Constitutional: Positive for fatigue. Negative for appetite change, chills, fever and unexpected weight change.  HENT:   Negative for hearing loss and voice change.   Eyes: Negative for eye problems and icterus.  Respiratory: Negative for chest tightness, cough and shortness of breath.   Cardiovascular: Negative for chest pain and leg swelling.  Gastrointestinal: Negative for abdominal distention and abdominal pain.  Endocrine: Negative for hot flashes.  Genitourinary: Negative for difficulty urinating, dysuria and frequency.   Musculoskeletal: Negative for arthralgias.  Skin: Negative for itching and rash.  Neurological: Negative for light-headedness and numbness.  Hematological: Negative for adenopathy. Does not bruise/bleed easily.  Psychiatric/Behavioral: Negative for confusion.   MEDICAL HISTORY:  Past Medical History:  Diagnosis Date  . BPH (benign prostatic hyperplasia)   . CAD (coronary artery disease)    a. 04/2014 anterolateral STEMI/PCI: LM nl, LAD 32m(2.75x16 Promus DES, D1 50p, LCX nl, RI nl, OM1 100 small/chronic, RCA  dominant, [id number](  FFR 0.90->Med Rx), RPDA 50, RPLA 70, EF 60%.  Marland Kitchen COPD (chronic obstructive pulmonary disease) (HCC)    Dr Raul Del  . Former tobacco use   . Gastroesophageal reflux disease   . HTN (hypertension)   . Hyperlipidemia   . Hypokalemia   . Malignant neoplasm of lung ([place]) 04/01/2018  . Myocardial infarction ([place]) 04/2014   Dr Sherren Mocha  . Stented coronary artery     SURGICAL HISTORY: Past Surgical History:  Procedure Laterality Date  . CARDIAC CATHETERIZATION    . CHOLECYSTECTOMY N/A 02/15/2016   Procedure: LAPAROSCOPIC CHOLECYSTECTOMY WITH INTRAOPERATIVE CHOLANGIOGRAM;  Surgeon: Leonie Green, MD;  Location: [facility] ORS;  Service: General;  Laterality: N/A;  . COLONOSCOPY    . COLONOSCOPY WITH PROPOFOL N/A 08/02/2015   Procedure: COLONOSCOPY WITH PROPOFOL;  Surgeon: Manya Silvas, MD;  Location[facility] ENDOSCOPY;  Service: Endoscopy;  Laterality: N/A;  . CORONARY ANGIOPLASTY  04/2014   STENT  . ENDOBRONCHIAL ULTRASOUND N/A 03/26/2018   Procedure: ENDOBRONCHIAL ULTRASOUND;  Surgeon: Flora Lipps, MD;  Location: [facility] ORS;  Service: Cardiopulmonary;  Laterality: N/A;  . ESOPHAGOGASTRODUODENOSCOPY (EGD) WITH PROPOFOL N/A 08/02/2015   Procedure: ESOPHAGOGASTRODUODENOSCOPY (EGD) WITH PROPOFOL;  Surgeon: Manya Silvas, MD;  Location: [facility] ENDOSCOPY;  Service: Endoscopy;  Laterality: N/A;  . FLEXIBLE BRONCHOSCOPY N/A 03/26/2018   Procedure: FLEXIBLE BRONCHOSCOPY;  Surgeon: Flora Lipps, MD;  Location: [facility] ORS;  Service: Cardiopulmonary;  Laterality: N/A;  . KNEE ARTHROSCOPY Left   . LEFT HEART CATHETERIZATION WITH CORONARY ANGIOGRAM N/A 05/03/2014   Procedure: LEFT HEART CATHETERIZATION WITH CORONARY ANGIOGRAM;  Surgeon: Blane Ohara, MD;  Location: [facility] CATH LAB;  Service: Cardiovascular;  Laterality: N/A;  . PERCUTANEOUS CORONARY STENT INTERVENTION (PCI-S) N/A 05/05/2014   Procedure: PERCUTANEOUS CORONARY STENT INTERVENTION (PCI-S);  Surgeon: Peter M Martinique,  MD;  Location: [facility] CATH LAB;  Service: Cardiovascular;  Laterality: N/A;  . PORTACATH PLACEMENT Left 04/04/2018   Procedure: INSERTION PORT-A-CATH;  Surgeon: [name], MD;  Location: [facility] ORS;  Service: General;  Laterality: Left;    SOCIAL HISTORY: Social History   Socioeconomic History  . Marital status: Married    Spouse name: Not on file  . Number of children: Not on file  . Years of education: Not on file  . Highest education level: Not on file  Occupational History  . Not on file  Social Needs  . Financial resource strain: Not on file  . Food insecurity    Worry: Not on file    Inability: Not on file  . Transportation needs    Medical: Not on file    Non-medical: Not on file  Tobacco Use  . Smoking status: Former Smoker    Packs/day: 1.00    Years: 40.00    Pack years: 40.00    Types: Cigarettes    Quit date: 02/14/2011    Years since quitting: 8.0  . Smokeless tobacco: Never Used  Substance and Sexual Activity  . Alcohol use: No  . Drug use: No  . Sexual activity: Not on file  Lifestyle  . Physical activity    Days per week: Not on file    Minutes per session: Not on file  . Stress: Not on file  Relationships  . Social Herbalist on phone: Not on file    Gets together: Not on file    Attends religious service: Not on file    Active member of club or organization: Not on file    Attends  meetings of clubs or organizations: Not on file    Relationship status: Not on file  . Intimate partner violence    Fear of current or ex partner: Not on file    Emotionally abused: Not on file    Physically abused: Not on file    Forced sexual activity: Not on file  Other Topics Concern  . Not on file  Social History Narrative   The patient is married. He lives in [place]. He works full-time. He quit smoking in 2013.    FAMILY HISTORY: Family History  Problem Relation Age of Onset  . CAD Mother 43       Died of an MI  . CAD Father 60       Died  of a massive MI  . COPD Sister   . Lung cancer Brother   . Bone cancer Paternal Uncle   . Colon cancer Neg Hx   . Breast cancer Neg Hx     ALLERGIES:  has No Known Allergies.  MEDICATIONS:  Current Outpatient Medications  Medication Sig Dispense Refill  . acetaminophen (TYLENOL) 500 MG tablet Take 1,000 mg by mouth every 6 (six) hours as needed (for pain.).    Marland Kitchen albuterol (PROAIR HFA) 108 (90 Base) MCG/ACT inhaler Inhale 2 puffs into the lungs every 6 (six) hours as needed for wheezing or shortness of breath.     Marland Kitchen aspirin EC 81 MG tablet Take 81 mg by mouth daily.    Marland Kitchen atorvastatin (LIPITOR) 80 MG tablet Take 1 tablet (80 mg total) by mouth daily at 6 PM. 90 tablet 3  . carvedilol (COREG) 6.25 MG tablet Take 1 tablet (6.25 mg total) by mouth 2 (two) times daily. 180 tablet 3  . colestipol (COLESTID) 1 g tablet Take 2 g by mouth as needed (for stomach issues).     . folic acid (FOLVITE) 1 MG tablet Take 1 tablet (1 mg total) by mouth daily. 90 tablet 1  . lidocaine-prilocaine (EMLA) cream Apply small amount to port site 1 hour prior to [facility] being accessed and cover with plastic wrap 30 g 1  . nitroGLYCERIN (NITROSTAT) 0.4 MG SL tablet Place 1 tablet (0.4 mg total) under the tongue every 5 (five) minutes as needed for chest pain. X 3 doses 25 tablet 3  . omeprazole (PRILOSEC) 40 MG capsule TAKE 1 CAPSULE BY MOUTH TWICE A DAY 180 capsule 1  . ondansetron (ZOFRAN) 8 MG tablet PLEASE SEE ATTACHED FOR DETAILED DIRECTIONS    . oxyCODONE-acetaminophen (PERCOCET/ROXICET) 5-325 MG tablet Take by mouth.    . potassium chloride SA (KLOR-CON M20) 20 MEQ tablet Take 1 tablet (20 mEq total) by mouth daily. 90 tablet 3  . sucralfate (CARAFATE) 1 g tablet Take 1 tablet (1 g total) by mouth 3 (three) times daily before meals. Dissolve in warm water, swish and swallow 90 tablet 6  . tamsulosin (FLOMAX) 0.4 MG CAPS capsule Take 0.4 mg by mouth daily after supper.    . traZODone (DESYREL) 50 MG tablet Take 1  tablet (50 mg total) by mouth at bedtime. 90 tablet 0  . umeclidinium-vilanterol (ANORO ELLIPTA) 62.5-25 MCG/INH AEPB Inhale 1 puff into the lungs every morning.    . vitamin B-12 (CYANOCOBALAMIN) 1000 MCG tablet Take 1 tablet (1,000 mcg total) by mouth daily. 90 tablet 1  . umeclidinium-vilanterol (ANORO ELLIPTA) 62.5-25 MCG/INH AEPB Inhale 1 puff into the lungs daily for 1 dose. 180 each 1   No current facility-administered medications for this  visit.    Facility-Administered Medications Ordered in Other Visits  Medication Dose Route Frequency Provider Last Rate Last Dose  . sodium chloride flush (NS) 0.9 % injection 10 mL  10 mL Intravenous PRN Earlie Server, MD   10 mL at 03/10/19 0850     PHYSICAL EXAMINATION: ECOG PERFORMANCE STATUS: 0 - Asymptomatic Vitals:   03/10/19 0913  BP: 133/86  Pulse: 63  Resp: 18  Temp: (!) 95.4 F (35.2 C)  SpO2: 100%   Filed Weights   03/10/19 0913  Weight: 154 lb 12.8 oz (70.2 kg)    Physical Exam Constitutional:      General: He is not in acute distress. HENT:     Head: Normocephalic and atraumatic.  Eyes:     General: No scleral icterus.    Pupils: Pupils are equal, round, and reactive to light.  Neck:     Musculoskeletal: Normal range of motion and neck supple.  Cardiovascular:     Rate and Rhythm: Normal rate and regular rhythm.     Heart sounds: Normal heart sounds.  Pulmonary:     Effort: Pulmonary effort is normal. No respiratory distress.     Breath sounds: No wheezing.  Abdominal:     General: Bowel sounds are normal. There is no distension.     Palpations: Abdomen is soft. There is no mass.     Tenderness: There is no abdominal tenderness.  Musculoskeletal: Normal range of motion.        General: No deformity.  Skin:    General: Skin is warm and dry.     Findings: No erythema or rash.  Neurological:     Mental Status: He is alert and oriented to person, place, and time.     Cranial Nerves: No cranial nerve deficit.      Coordination: Coordination normal.  Psychiatric:        Behavior: Behavior normal.        Thought Content: Thought content normal.      LABORATORY DATA:  I have reviewed the data as listed Lab Results  Component Value Date   WBC 5.4 03/10/2019   HGB 14.0 03/10/2019   HCT 41.7 03/10/2019   MCV 91.4 03/10/2019   PLT 175 03/10/2019   Recent Labs    02/10/19 0903 02/24/19 0856 03/10/19 0850  NA 136 138 134*  K 3.8 3.8 3.8  CL 106 106 105  CO2 _0 GLUCOSE 118* 123* 100*  BUN _1 CREATININE 1.00 1.02 1.03  CALCIUM 8.8* 8.5* 8.6*  GFRNONAA >60 >60 >60  GFRAA >60 >60 >60  PROT 7.0 6.5 6.7  ALBUMIN 3.7 3.7 3.9  AST _2 ALT _3 ALKPHOS 184* 160* 146*  BILITOT 0.6 0.6 0.8   Iron/TIBC/Ferritin/ %Sat    Component Value Date/Time   IRON 41 (L) 11/21/2018 0856   TIBC 260 11/21/2018 0856   FERRITIN 150 11/21/2018 0856   IRONPCTSAT 16 (L) 11/21/2018 0856    RADIOGRAPHIC STUDIES: I have personally reviewed the radiological images as listed and agreed with the findings in the report.  Ct Chest W Contrast  Result Date: 01/20/2019 CLINICAL DATA:  Follow-up non-small cell lung cancer EXAM: CT CHEST WITH CONTRAST TECHNIQUE: Multidetector CT imaging of the chest was performed during intravenous contrast administration. CONTRAST:  28m OMNIPAQUE IOHEXOL 300 MG/ML  SOLN COMPARISON:  10/15/2018 FINDINGS: Cardiovascular: Heart is normal in size.  No pericardial effusion. No evidence thoracic aortic aneurysm. Atherosclerotic  calcifications of the aortic arch. Coronary atherosclerosis of the LAD. Left chest port terminates in the mid SVC. Mediastinum/Nodes: No suspicious mediastinal lymphadenopathy. Visualized thyroid is unremarkable. Lungs/Pleura: Biapical pleural-parenchymal scarring. Moderate centrilobular and paraseptal emphysematous changes, upper lung predominant. Radiation changes in the medial right lower lobe (series 3/image 122), improved. Additional platelike  radiation changes inferiorly in the right middle lobe. Linear scarring/atelectasis in the medial right upper lobe. Mild subpleural patchy opacities in the lungs bilaterally with mild mosaic attenuation in the right middle and lower lobes (for example, series 3/image 70, suggesting mild infectious/inflammatory pneumonitis. No pleural effusion or pneumothorax. Upper Abdomen: Visualized upper abdomen is notable for prior cholecystectomy and mild vascular calcifications. Musculoskeletal: Visualized osseous structures are within normal limits. IMPRESSION: Radiation changes in the medial right lower lobe, improved. No findings specific for recurrent or metastatic disease. Mild subpleural patchy opacities in the lungs bilaterally, suggesting mild infection/inflammatory pneumonitis. Aortic Atherosclerosis (ICD10-I70.0) and Emphysema (ICD10-J43.9). Electronically Signed   By: Julian Hy M.D.   On: 01/20/2019 10:47     ASSESSMENT & PLAN:  1. Malignant neoplasm of lower lobe of right lung ([place])   2. Encounter for antineoplastic immunotherapy   3. Elevated alkaline phosphatase level   4. Chronic obstructive pulmonary disease, unspecified COPD type ([place])   Cancer Staging Malignant neoplasm of lower lobe of right lung [facility]) Staging form: Lung, AJCC 8th Edition - Clinical stage from 05/13/2018: Stage IIIA (cT1c, cN2, cM0) - Signed by Earlie Server, MD on 05/13/2018  #Stage IIIA lung adenocarcinoma Labs are reviewed and discussed with patient. Counts acceptable proceed with durvalumab treatments today. CT surveillance images were done 01/20/2019.  Patient is due for another CT chest without contrast at the end of December.  #Elevated alkaline phosphatase, counts are trending down.  Labs are reviewed and discussed with patient. Probably due to immune therapy  #Chronic fatigue, stable.  TSH has been monitored. #COPD, patient uses inhalers.  Patient has make a follow-up visit with pulmonology. Continue Anoro 1  puff daily.  Follow-up in 2 weeks for evaluation prior to next cycle of durvalumab treatments.   Earlie Server, MD, PhD 03/10/19

## 2019-03-13 ENCOUNTER — Telehealth: Payer: Self-pay | Admitting: Adult Health

## 2019-03-13 ENCOUNTER — Other Ambulatory Visit: Payer: Self-pay | Admitting: Internal Medicine

## 2019-03-13 NOTE — Telephone Encounter (Signed)
Timothy Yoder with [facility] is aware of directions of Anoro and voiced her understanding.  Nothing further is needed.

## 2019-03-24 ENCOUNTER — Inpatient Hospital Stay (HOSPITAL_BASED_OUTPATIENT_CLINIC_OR_DEPARTMENT_OTHER): Payer: BC Managed Care – PPO | Admitting: Oncology

## 2019-03-24 ENCOUNTER — Inpatient Hospital Stay: Payer: BC Managed Care – PPO

## 2019-03-24 ENCOUNTER — Encounter: Payer: Self-pay | Admitting: Oncology

## 2019-03-24 ENCOUNTER — Other Ambulatory Visit: Payer: Self-pay

## 2019-03-24 VITALS — BP 133/80 | HR 61 | Temp 95.7°F | Resp 18 | Wt 158.7 lb

## 2019-03-24 DIAGNOSIS — C3431 Malignant neoplasm of lower lobe, right bronchus or lung: Secondary | ICD-10-CM

## 2019-03-24 DIAGNOSIS — R5383 Other fatigue: Secondary | ICD-10-CM | POA: Diagnosis not present

## 2019-03-24 DIAGNOSIS — J449 Chronic obstructive pulmonary disease, unspecified: Secondary | ICD-10-CM | POA: Diagnosis not present

## 2019-03-24 DIAGNOSIS — Z95828 Presence of other vascular implants and grafts: Secondary | ICD-10-CM

## 2019-03-24 DIAGNOSIS — Z5112 Encounter for antineoplastic immunotherapy: Secondary | ICD-10-CM | POA: Diagnosis not present

## 2019-03-24 LAB — COMPREHENSIVE METABOLIC PANEL
ALT: 27 U/L (ref 0–44)
AST: 25 U/L (ref 15–41)
Albumin: 3.7 g/dL (ref 3.5–5.0)
Alkaline Phosphatase: 124 U/L (ref 38–126)
Anion gap: 7 (ref 5–15)
BUN: 9 mg/dL (ref 8–23)
CO2: 23 mmol/L (ref 22–32)
Calcium: 8.7 mg/dL — ABNORMAL LOW (ref 8.9–10.3)
Chloride: 105 mmol/L (ref 98–111)
Creatinine, Ser: 0.97 mg/dL (ref 0.61–1.24)
GFR calc Af Amer: >60 mL/min (ref >60)
GFR calc non Af Amer: >60 mL/min (ref >60)
Glucose, Bld: 104 mg/dL — ABNORMAL HIGH (ref 70–99)
Potassium: 3.4 mmol/L — ABNORMAL LOW (ref 3.5–5.1)
Sodium: 135 mmol/L (ref 135–145)
Total Bilirubin: 0.7 mg/dL (ref 0.3–1.2)
Total Protein: 6.7 g/dL (ref 6.5–8.1)

## 2019-03-24 LAB — CBC WITH DIFFERENTIAL/PLATELET
Abs Immature Granulocytes: 0.03 10*3/uL (ref 0.00–0.07)
Basophils Absolute: 0 10*3/uL (ref 0.0–0.1)
Basophils Relative: 1 %
Eosinophils Absolute: 0.1 10*3/uL (ref 0.0–0.5)
Eosinophils Relative: 2 %
HCT: 40.4 % (ref 39.0–52.0)
Hemoglobin: 13.4 g/dL (ref 13.0–17.0)
Immature Granulocytes: 1 %
Lymphocytes Relative: 18 %
Lymphs Abs: 1 10*3/uL (ref 0.7–4.0)
MCH: 30.8 pg (ref 26.0–34.0)
MCHC: 33.2 g/dL (ref 30.0–36.0)
MCV: 92.9 fL (ref 80.0–100.0)
Monocytes Absolute: 0.6 10*3/uL (ref 0.1–1.0)
Monocytes Relative: 11 %
Neutro Abs: 3.6 10*3/uL (ref 1.7–7.7)
Neutrophils Relative %: 67 %
Platelets: 151 10*3/uL (ref 150–400)
RBC: 4.35 MIL/uL (ref 4.22–5.81)
RDW: 14.6 % (ref 11.5–15.5)
WBC: 5.4 10*3/uL (ref 4.0–10.5)
nRBC: 0 % (ref 0.0–0.2)

## 2019-03-24 MED ORDER — SODIUM CHLORIDE 0.9 % IV SOLN
10.0000 mg/kg | Freq: Once | INTRAVENOUS | Status: AC
  Administered 2019-03-24: 700 mg via INTRAVENOUS
  Filled 2019-03-24: qty 10

## 2019-03-24 MED ORDER — SODIUM CHLORIDE 0.9% FLUSH
10.0000 mL | Freq: Once | INTRAVENOUS | Status: AC
  Administered 2019-03-24: 10 mL via INTRAVENOUS
  Filled 2019-03-24: qty 10

## 2019-03-24 MED ORDER — HEPARIN SOD (PORK) LOCK FLUSH 100 UNIT/ML IV SOLN
500.0000 [IU] | Freq: Once | INTRAVENOUS | Status: AC | PRN
  Administered 2019-03-24: 500 [IU]
  Filled 2019-03-24: qty 5

## 2019-03-24 MED ORDER — SODIUM CHLORIDE 0.9 % IV SOLN
Freq: Once | INTRAVENOUS | Status: AC
  Administered 2019-03-24: 10:00:00 via INTRAVENOUS
  Filled 2019-03-24: qty 250

## 2019-03-24 NOTE — Progress Notes (Signed)
Hematology/Oncology follow up note [facility] [phone] [phone]   Patient Care Team: Baxter Hire, MD as PCP - General (Internal Medicine) Sherren Mocha, MD as PCP - Cardiology (Cardiology) Telford Nab, RN as Registered Nurse  REFERRING PROVIDER: Dr.Fleming REASON FOR VISIT:  Follow-up for management of stage III non-small cell lung cancer   HISTORY OF PRESENTING ILLNESS:  Timothy Yoder is a  63 y.o.  male with PMH listed below who was referred to me for evaluation of lung mass.  Patient is a former smoker quit smoking 7 years ago.  40-pack-year. Patient chest lung cancer screening on 03/11/2018. CT scan showed 2.4 cm right lower lobe nodule with a new necrotic.  Subcarinal lymph node. PET scan showed hypermetabolic right lower pulmonary nodule, consistent with primary bronchogenic carcinoma.  Fluid density subcarinal structure demonstrate no significant hypermetabolism.  Although this could represent an incidental benign lesion such as a cyst given the interval development since 02/24/2017, necrotic adenopathy cannot be excluded and tissue sampling should be considered.  Patient was seen by Dr. Faith Rogue for initial evaluation on 03/13/2018.  Patient has not got PET scan at that time. Today patient was accompanied by his wife to the oncology clinic to discuss management plan.  And PET scan results. Patient reports chronic shortness of breath with moderate exertion.  Appetite is good denies any fever, chills, hemoptysis, weight loss.  # underwent biopsy via bronchoscopy.  Subcarinal lymph node biopsy positive for non-small cell lung cancer, favoring poorly differentiated adenocarcinoma. #Mediport was placed by Dr. Genevive Bi  # #Elevated alkaline phosphatase. Patient has been evaluated by gastroenterology.  Liver biopsy showed mild sinusoidal dilation and congestion.  Minimal changes suggestive of hepatic process. Iron stain demonstrate  minimal focal hepatocellular iron. I discussed with Dr.Wohl via secure chat about patient's pathology result.  No restriction of proceeding with immunotherapy.  Continue watch for the labs.   # Cancer Treatment 04/26/2018 -06/11/2018 Concurrent Chemotherapy [weekly Carbo and Taxol] with RT.  07/10/2018 Started on maintenance Durvalumab.  INTERVAL HISTORY Timothy Yoder is a 63 y.o. male who has above history reviewed by me today presents for follow up visit for management of stage III non-small cell lung cancer.  Patient has been on maintenance immunotherapy. Patient reports doing well today.  No new complaints.  Denies fever, chills, nausea, vomiting, diarrhea, chest pain, shortness of breath, abdominal pain, urinary symptoms, lower extremity swelling.  Fatigue is chronic, unchanged.  Chronic mild SOB with exertion, unchanged.   Review of Systems  Constitutional: Positive for fatigue. Negative for appetite change, chills, fever and unexpected weight change.  HENT:   Negative for hearing loss and voice change.   Eyes: Negative for eye problems and icterus.  Respiratory: Negative for chest tightness, cough and shortness of breath.   Cardiovascular: Negative for chest pain and leg swelling.  Gastrointestinal: Negative for abdominal distention and abdominal pain.  Endocrine: Negative for hot flashes.  Genitourinary: Negative for difficulty urinating, dysuria and frequency.   Musculoskeletal: Negative for arthralgias.  Skin: Negative for itching and rash.  Neurological: Negative for light-headedness and numbness.  Hematological: Negative for adenopathy. Does not bruise/bleed easily.  Psychiatric/Behavioral: Negative for confusion.   MEDICAL HISTORY:  Past Medical History:  Diagnosis Date   BPH (benign prostatic hyperplasia)    CAD (coronary artery disease)    a. 04/2014 anterolateral STEMI/PCI: LM nl, LAD 28m(2.75x16 Promus DES, D1 50p, LCX nl, RI nl, OM1 100 small/chronic, RCA dominant,  [id number](FFR  0.90->Med Rx), RPDA 50, RPLA 70, EF 60%.   COPD (chronic obstructive pulmonary disease) (HCC)    Dr Raul Del   Former tobacco use    Gastroesophageal reflux disease    HTN (hypertension)    Hyperlipidemia    Hypokalemia    Malignant neoplasm of lung ([place]) 04/01/2018   Myocardial infarction ([place]) 04/2014   Dr Sherren Mocha   Stented coronary artery     SURGICAL HISTORY: Past Surgical History:  Procedure Laterality Date   CARDIAC CATHETERIZATION     CHOLECYSTECTOMY N/A 02/15/2016   Procedure: LAPAROSCOPIC CHOLECYSTECTOMY WITH INTRAOPERATIVE CHOLANGIOGRAM;  Surgeon: Leonie Green, MD;  Location: [facility] ORS;  Service: General;  Laterality: N/A;   COLONOSCOPY     COLONOSCOPY WITH PROPOFOL N/A 08/02/2015   Procedure: COLONOSCOPY WITH PROPOFOL;  Surgeon: Manya Silvas, MD;  Location: [facility] ENDOSCOPY;  Service: Endoscopy;  Laterality: N/A;   CORONARY ANGIOPLASTY  04/2014   STENT   ENDOBRONCHIAL ULTRASOUND N/A 03/26/2018   Procedure: ENDOBRONCHIAL ULTRASOUND;  Surgeon: Flora Lipps, MD;  Location: [facility] ORS;  Service: Cardiopulmonary;  Laterality: N/A;   ESOPHAGOGASTRODUODENOSCOPY (EGD) WITH PROPOFOL N/A 08/02/2015   Procedure: ESOPHAGOGASTRODUODENOSCOPY (EGD) WITH PROPOFOL;  Surgeon: Manya Silvas, MD;  Location: [facility] ENDOSCOPY;  Service: Endoscopy;  Laterality: N/A;   FLEXIBLE BRONCHOSCOPY N/A 03/26/2018   Procedure: FLEXIBLE BRONCHOSCOPY;  Surgeon: Flora Lipps, MD;  Location: [facility] ORS;  Service: Cardiopulmonary;  Laterality: N/A;   KNEE ARTHROSCOPY Left    LEFT HEART CATHETERIZATION WITH CORONARY ANGIOGRAM N/A 05/03/2014   Procedure: LEFT HEART CATHETERIZATION WITH CORONARY ANGIOGRAM;  Surgeon: Blane Ohara, MD;  Location: [facility] CATH LAB;  Service: Cardiovascular;  Laterality: N/A;   PERCUTANEOUS CORONARY STENT INTERVENTION (PCI-S) N/A 05/05/2014   Procedure: PERCUTANEOUS CORONARY STENT INTERVENTION (PCI-S);  Surgeon: Peter M Martinique, MD;   Location: [facility] CATH LAB;  Service: Cardiovascular;  Laterality: N/A;   PORTACATH PLACEMENT Left 04/04/2018   Procedure: INSERTION PORT-A-CATH;  Surgeon: [name], MD;  Location: [facility] ORS;  Service: General;  Laterality: Left;    SOCIAL HISTORY: Social History   Socioeconomic History   Marital status: Married    Spouse name: Not on file   Number of children: Not on file   Years of education: Not on file   Highest education level: Not on file  Occupational History   Not on file  Social Needs   Financial resource strain: Not on file   Food insecurity    Worry: Not on file    Inability: Not on file   Transportation needs    Medical: Not on file    Non-medical: Not on file  Tobacco Use   Smoking status: Former Smoker    Packs/day: 1.00    Years: 40.00    Pack years: 40.00    Types: Cigarettes    Quit date: 02/14/2011    Years since quitting: 8.1   Smokeless tobacco: Never Used  Substance and Sexual Activity   Alcohol use: No   Drug use: No   Sexual activity: Not on file  Lifestyle   Physical activity    Days per week: Not on file    Minutes per session: Not on file   Stress: Not on file  Relationships   Social connections    Talks on phone: Not on file    Gets together: Not on file    Attends religious service: Not on file    Active member of club or organization: Not on file    Attends meetings  of clubs or organizations: Not on file    Relationship status: Not on file   Intimate partner violence    Fear of current or ex partner: Not on file    Emotionally abused: Not on file    Physically abused: Not on file    Forced sexual activity: Not on file  Other Topics Concern   Not on file  Social History Narrative   The patient is married. He lives in [place]. He works full-time. He quit smoking in 2013.    FAMILY HISTORY: Family History  Problem Relation Age of Onset   CAD Mother 85       Died of an MI   CAD Father 78       Died of a  massive MI   COPD Sister    Lung cancer Brother    Bone cancer Paternal Uncle    Colon cancer Neg Hx    Breast cancer Neg Hx     ALLERGIES:  has No Known Allergies.  MEDICATIONS:  Current Outpatient Medications  Medication Sig Dispense Refill   acetaminophen (TYLENOL) 500 MG tablet Take 1,000 mg by mouth every 6 (six) hours as needed (for pain.).     albuterol (PROAIR HFA) 108 (90 Base) MCG/ACT inhaler Inhale 2 puffs into the lungs every 6 (six) hours as needed for wheezing or shortness of breath.      ANORO ELLIPTA 62.5-25 MCG/INH AEPB INHALE 1 PUFF INTO THE LUNGS DAILY 180 each 0   aspirin EC 81 MG tablet Take 81 mg by mouth daily.     atorvastatin (LIPITOR) 80 MG tablet Take 1 tablet (80 mg total) by mouth daily at 6 PM. 90 tablet 3   carvedilol (COREG) 6.25 MG tablet Take 1 tablet (6.25 mg total) by mouth 2 (two) times daily. 180 tablet 3   colestipol (COLESTID) 1 g tablet Take 2 g by mouth as needed (for stomach issues).      folic acid (FOLVITE) 1 MG tablet Take 1 tablet (1 mg total) by mouth daily. 90 tablet 1   lidocaine-prilocaine (EMLA) cream Apply small amount to port site 1 hour prior to [facility] being accessed and cover with plastic wrap 30 g 1   nitroGLYCERIN (NITROSTAT) 0.4 MG SL tablet Place 1 tablet (0.4 mg total) under the tongue every 5 (five) minutes as needed for chest pain. X 3 doses 25 tablet 3   omeprazole (PRILOSEC) 40 MG capsule TAKE 1 CAPSULE BY MOUTH TWICE A DAY 180 capsule 1   ondansetron (ZOFRAN) 8 MG tablet PLEASE SEE ATTACHED FOR DETAILED DIRECTIONS     oxyCODONE-acetaminophen (PERCOCET/ROXICET) 5-325 MG tablet Take by mouth.     potassium chloride SA (KLOR-CON M20) 20 MEQ tablet Take 1 tablet (20 mEq total) by mouth daily. 90 tablet 3   sucralfate (CARAFATE) 1 g tablet Take 1 tablet (1 g total) by mouth 3 (three) times daily before meals. Dissolve in warm water, swish and swallow 90 tablet 6   tamsulosin (FLOMAX) 0.4 MG CAPS capsule Take  0.4 mg by mouth daily after supper.     traZODone (DESYREL) 50 MG tablet Take 1 tablet (50 mg total) by mouth at bedtime. 90 tablet 0   vitamin B-12 (CYANOCOBALAMIN) 1000 MCG tablet Take 1 tablet (1,000 mcg total) by mouth daily. 90 tablet 1   No current facility-administered medications for this visit.      PHYSICAL EXAMINATION: ECOG PERFORMANCE STATUS: 0 - Asymptomatic Vitals:   03/24/19 0906  BP: 133/80  Pulse: 61  Resp: 18  Temp: (!) 95.7 F (35.4 C)   Filed Weights   03/24/19 0906  Weight: 158 lb 11.2 oz (72 kg)    Physical Exam Constitutional:      General: He is not in acute distress. HENT:     Head: Normocephalic and atraumatic.  Eyes:     General: No scleral icterus.    Pupils: Pupils are equal, round, and reactive to light.  Neck:     Musculoskeletal: Normal range of motion and neck supple.  Cardiovascular:     Rate and Rhythm: Normal rate and regular rhythm.     Heart sounds: Normal heart sounds.  Pulmonary:     Effort: Pulmonary effort is normal. No respiratory distress.     Breath sounds: No wheezing.  Abdominal:     General: Bowel sounds are normal. There is no distension.     Palpations: Abdomen is soft. There is no mass.     Tenderness: There is no abdominal tenderness.  Musculoskeletal: Normal range of motion.        General: No deformity.  Skin:    General: Skin is warm and dry.     Findings: No erythema or rash.  Neurological:     Mental Status: He is alert and oriented to person, place, and time.     Cranial Nerves: No cranial nerve deficit.     Coordination: Coordination normal.  Psychiatric:        Behavior: Behavior normal.        Thought Content: Thought content normal.      LABORATORY DATA:  I have reviewed the data as listed Lab Results  Component Value Date   WBC 5.4 03/24/2019   HGB 13.4 03/24/2019   HCT 40.4 03/24/2019   MCV 92.9 03/24/2019   PLT 151 03/24/2019   Recent Labs    02/10/19 0903 02/24/19 0856  03/10/19 0850  NA 136 138 134*  K 3.8 3.8 3.8  CL 106 106 105  CO2 _0 GLUCOSE 118* 123* 100*  BUN _1 CREATININE 1.00 1.02 1.03  CALCIUM 8.8* 8.5* 8.6*  GFRNONAA >60 >60 >60  GFRAA >60 >60 >60  PROT 7.0 6.5 6.7  ALBUMIN 3.7 3.7 3.9  AST _2 ALT _3 ALKPHOS 184* 160* 146*  BILITOT 0.6 0.6 0.8   Iron/TIBC/Ferritin/ %Sat    Component Value Date/Time   IRON 41 (L) 11/21/2018 0856   TIBC 260 11/21/2018 0856   FERRITIN 150 11/21/2018 0856   IRONPCTSAT 16 (L) 11/21/2018 0856    RADIOGRAPHIC STUDIES: I have personally reviewed the radiological images as listed and agreed with the findings in the report.  Ct Chest W Contrast  Result Date: 01/20/2019 CLINICAL DATA:  Follow-up non-small cell lung cancer EXAM: CT CHEST WITH CONTRAST TECHNIQUE: Multidetector CT imaging of the chest was performed during intravenous contrast administration. CONTRAST:  27m OMNIPAQUE IOHEXOL 300 MG/ML  SOLN COMPARISON:  10/15/2018 FINDINGS: Cardiovascular: Heart is normal in size.  No pericardial effusion. No evidence thoracic aortic aneurysm. Atherosclerotic calcifications of the aortic arch. Coronary atherosclerosis of the LAD. Left chest port terminates in the mid SVC. Mediastinum/Nodes: No suspicious mediastinal lymphadenopathy. Visualized thyroid is unremarkable. Lungs/Pleura: Biapical pleural-parenchymal scarring. Moderate centrilobular and paraseptal emphysematous changes, upper lung predominant. Radiation changes in the medial right lower lobe (series 3/image 122), improved. Additional platelike radiation changes inferiorly in the right middle lobe. Linear scarring/atelectasis in the medial right upper lobe.  Mild subpleural patchy opacities in the lungs bilaterally with mild mosaic attenuation in the right middle and lower lobes (for example, series 3/image 70, suggesting mild infectious/inflammatory pneumonitis. No pleural effusion or pneumothorax. Upper Abdomen: Visualized upper  abdomen is notable for prior cholecystectomy and mild vascular calcifications. Musculoskeletal: Visualized osseous structures are within normal limits. IMPRESSION: Radiation changes in the medial right lower lobe, improved. No findings specific for recurrent or metastatic disease. Mild subpleural patchy opacities in the lungs bilaterally, suggesting mild infection/inflammatory pneumonitis. Aortic Atherosclerosis (ICD10-I70.0) and Emphysema (ICD10-J43.9). Electronically Signed   By: Julian Hy M.D.   On: 01/20/2019 10:47     ASSESSMENT & PLAN:  1. Malignant neoplasm of lower lobe of right lung ([place])   2. Encounter for antineoplastic immunotherapy   3. Chronic obstructive pulmonary disease, unspecified COPD type ([place])   4. Other fatigue   Cancer Staging Malignant neoplasm of lower lobe of right lung [facility]) Staging form: Lung, AJCC 8th Edition - Clinical stage from 05/13/2018: Stage IIIA (cT1c, cN2, cM0) - Signed by Earlie Server, MD on 05/13/2018  #Stage IIIA lung adenocarcinoma Labs are reviewed and discussed with patient. Clinically he is doing well on immunotherapy maintenance. Counts are acceptable to proceed with durvalumab treatments today.   CT surveillance images will be obtained at the end of December.  #Elevated alkaline phosphatase, counts are trending down.  Completely normalized.  Labs are reviewed and discussed with patient.  Continue monitor #Chronic fatigue.  TSH has been monitored has been normal.  Check cortisol at the next visit. #COPD, continue follow-up with pulmonology.  Continue Anoro 1 puff daily.  Follow-up in 2 weeks for evaluation prior to next cycle of durvalumab treatments.   Earlie Server, MD, PhD 03/24/19

## 2019-03-24 NOTE — Progress Notes (Signed)
Patient does not offer any problems today.  

## 2019-04-07 ENCOUNTER — Inpatient Hospital Stay: Payer: BC Managed Care – PPO

## 2019-04-07 ENCOUNTER — Other Ambulatory Visit: Payer: Self-pay

## 2019-04-07 ENCOUNTER — Inpatient Hospital Stay (HOSPITAL_BASED_OUTPATIENT_CLINIC_OR_DEPARTMENT_OTHER): Payer: BC Managed Care – PPO | Admitting: Oncology

## 2019-04-07 ENCOUNTER — Inpatient Hospital Stay: Payer: BC Managed Care – PPO | Attending: Oncology

## 2019-04-07 ENCOUNTER — Encounter: Payer: Self-pay | Admitting: Oncology

## 2019-04-07 VITALS — BP 129/77 | HR 63 | Temp 96.6°F | Resp 16 | Wt 159.7 lb

## 2019-04-07 DIAGNOSIS — C3431 Malignant neoplasm of lower lobe, right bronchus or lung: Secondary | ICD-10-CM

## 2019-04-07 DIAGNOSIS — R5382 Chronic fatigue, unspecified: Secondary | ICD-10-CM | POA: Insufficient documentation

## 2019-04-07 DIAGNOSIS — Z5112 Encounter for antineoplastic immunotherapy: Secondary | ICD-10-CM

## 2019-04-07 DIAGNOSIS — R5383 Other fatigue: Secondary | ICD-10-CM

## 2019-04-07 DIAGNOSIS — Z95828 Presence of other vascular implants and grafts: Secondary | ICD-10-CM

## 2019-04-07 DIAGNOSIS — R748 Abnormal levels of other serum enzymes: Secondary | ICD-10-CM | POA: Insufficient documentation

## 2019-04-07 DIAGNOSIS — J449 Chronic obstructive pulmonary disease, unspecified: Secondary | ICD-10-CM | POA: Insufficient documentation

## 2019-04-07 LAB — CBC WITH DIFFERENTIAL/PLATELET
Abs Immature Granulocytes: 0.05 10*3/uL (ref 0.00–0.07)
Basophils Absolute: 0 10*3/uL (ref 0.0–0.1)
Basophils Relative: 1 %
Eosinophils Absolute: 0.1 10*3/uL (ref 0.0–0.5)
Eosinophils Relative: 2 %
HCT: 41.2 % (ref 39.0–52.0)
Hemoglobin: 13.6 g/dL (ref 13.0–17.0)
Immature Granulocytes: 1 %
Lymphocytes Relative: 16 %
Lymphs Abs: 1 10*3/uL (ref 0.7–4.0)
MCH: 30.8 pg (ref 26.0–34.0)
MCHC: 33 g/dL (ref 30.0–36.0)
MCV: 93.2 fL (ref 80.0–100.0)
Monocytes Absolute: 0.6 10*3/uL (ref 0.1–1.0)
Monocytes Relative: 10 %
Neutro Abs: 4.1 10*3/uL (ref 1.7–7.7)
Neutrophils Relative %: 70 %
Platelets: 156 10*3/uL (ref 150–400)
RBC: 4.42 MIL/uL (ref 4.22–5.81)
RDW: 14.1 % (ref 11.5–15.5)
WBC: 5.8 10*3/uL (ref 4.0–10.5)
nRBC: 0 % (ref 0.0–0.2)

## 2019-04-07 LAB — COMPREHENSIVE METABOLIC PANEL
ALT: 26 U/L (ref 0–44)
AST: 24 U/L (ref 15–41)
Albumin: 3.7 g/dL (ref 3.5–5.0)
Alkaline Phosphatase: 126 U/L (ref 38–126)
Anion gap: 9 (ref 5–15)
BUN: 10 mg/dL (ref 8–23)
CO2: 25 mmol/L (ref 22–32)
Calcium: 8.8 mg/dL — ABNORMAL LOW (ref 8.9–10.3)
Chloride: 106 mmol/L (ref 98–111)
Creatinine, Ser: 1.01 mg/dL (ref 0.61–1.24)
GFR calc Af Amer: >60 mL/min (ref >60)
GFR calc non Af Amer: >60 mL/min (ref >60)
Glucose, Bld: 106 mg/dL — ABNORMAL HIGH (ref 70–99)
Potassium: 3.7 mmol/L (ref 3.5–5.1)
Sodium: 140 mmol/L (ref 135–145)
Total Bilirubin: 0.6 mg/dL (ref 0.3–1.2)
Total Protein: 6.6 g/dL (ref 6.5–8.1)

## 2019-04-07 LAB — CORTISOL: Cortisol, Plasma: 10.9 ug/dL

## 2019-04-07 MED ORDER — SODIUM CHLORIDE 0.9 % IV SOLN
Freq: Once | INTRAVENOUS | Status: AC
  Administered 2019-04-07: 09:00:00 via INTRAVENOUS
  Filled 2019-04-07: qty 250

## 2019-04-07 MED ORDER — SODIUM CHLORIDE 0.9 % IV SOLN
10.0000 mg/kg | Freq: Once | INTRAVENOUS | Status: AC
  Administered 2019-04-07: 700 mg via INTRAVENOUS
  Filled 2019-04-07: qty 10

## 2019-04-07 MED ORDER — TRAZODONE HCL 50 MG PO TABS: 50.0000 mg | ORAL_TABLET | Freq: Every day | ORAL | 0 refills | Status: DC

## 2019-04-07 MED ORDER — OMEPRAZOLE 40 MG PO CPDR: 40.0000 mg | DELAYED_RELEASE_CAPSULE | Freq: Two times a day (BID) | ORAL | 1 refills | Status: AC

## 2019-04-07 MED ORDER — SODIUM CHLORIDE 0.9% FLUSH
10.0000 mL | Freq: Once | INTRAVENOUS | Status: AC
  Administered 2019-04-07: 10 mL via INTRAVENOUS
  Filled 2019-04-07: qty 10

## 2019-04-07 MED ORDER — HEPARIN SOD (PORK) LOCK FLUSH 100 UNIT/ML IV SOLN
500.0000 [IU] | Freq: Once | INTRAVENOUS | Status: AC | PRN
  Administered 2019-04-07: 11:00:00 500 [IU]
  Filled 2019-04-07: qty 5

## 2019-04-07 NOTE — Progress Notes (Signed)
Patient does not offer any problems today.  

## 2019-04-07 NOTE — Progress Notes (Signed)
Hematology/Oncology follow up note [facility] [phone] [phone]   Patient Care Team: Baxter Hire, MD as PCP - General (Internal Medicine) Sherren Mocha, MD as PCP - Cardiology (Cardiology) Telford Nab, RN as Registered Nurse  REFERRING PROVIDER: Dr.Fleming REASON FOR VISIT:  Follow-up for management of stage III non-small cell lung cancer   HISTORY OF PRESENTING ILLNESS:  Timothy Yoder is a  63 y.o.  male with PMH listed below who was referred to me for evaluation of lung mass.  Patient is a former smoker quit smoking 7 years ago.  40-pack-year. Patient chest lung cancer screening on 03/11/2018. CT scan showed 2.4 cm right lower lobe nodule with a new necrotic.  Subcarinal lymph node. PET scan showed hypermetabolic right lower pulmonary nodule, consistent with primary bronchogenic carcinoma.  Fluid density subcarinal structure demonstrate no significant hypermetabolism.  Although this could represent an incidental benign lesion such as a cyst given the interval development since 02/24/2017, necrotic adenopathy cannot be excluded and tissue sampling should be considered.  Patient was seen by Dr. Faith Rogue for initial evaluation on 03/13/2018.  Patient has not got PET scan at that time. Today patient was accompanied by his wife to the oncology clinic to discuss management plan.  And PET scan results. Patient reports chronic shortness of breath with moderate exertion.  Appetite is good denies any fever, chills, hemoptysis, weight loss.  # underwent biopsy via bronchoscopy.  Subcarinal lymph node biopsy positive for non-small cell lung cancer, favoring poorly differentiated adenocarcinoma. #Mediport was placed by Dr. Genevive Bi  # #Elevated alkaline phosphatase. Patient has been evaluated by gastroenterology.  Liver biopsy showed mild sinusoidal dilation and congestion.  Minimal changes suggestive of hepatic process. Iron stain demonstrate  minimal focal hepatocellular iron. I discussed with Dr.Wohl via secure chat about patient's pathology result.  No restriction of proceeding with immunotherapy.  Continue watch for the labs.   # Cancer Treatment 04/26/2018 -06/11/2018 Concurrent Chemotherapy [weekly Carbo and Taxol] with RT.  07/10/2018 Started on maintenance Durvalumab.  INTERVAL HISTORY Timothy Yoder is a 63 y.o. male who has above history reviewed by me today presents for follow up visit for management of stage III non-small cell lung cancer.   Patient has been on maintenance immunotherapy. Overall he tolerates well. No new complaints today Denies any fever, chills, nausea vomiting, diarrhea, chest pain, shortness of breath, abdominal pain or urinary symptoms. Fatigue is chronic.  Unchanged Chronic mild shortness of breath with exertion which is unchanged.    Review of Systems  Constitutional: Positive for fatigue. Negative for appetite change, chills, fever and unexpected weight change.  HENT:   Negative for hearing loss and voice change.   Eyes: Negative for eye problems and icterus.  Respiratory: Negative for chest tightness, cough and shortness of breath.   Cardiovascular: Negative for chest pain and leg swelling.  Gastrointestinal: Negative for abdominal distention and abdominal pain.  Endocrine: Negative for hot flashes.  Genitourinary: Negative for difficulty urinating, dysuria and frequency.   Musculoskeletal: Negative for arthralgias.  Skin: Negative for itching and rash.  Neurological: Negative for light-headedness and numbness.  Hematological: Negative for adenopathy. Does not bruise/bleed easily.  Psychiatric/Behavioral: Negative for confusion.   MEDICAL HISTORY:  Past Medical History:  Diagnosis Date  . BPH (benign prostatic hyperplasia)   . CAD (coronary artery disease)    a. 04/2014 anterolateral STEMI/PCI: LM nl, LAD 96m(2.75x16 Promus DES, D1 50p, LCX nl, RI nl, OM1 100 small/chronic, RCA  dominant, 20-361m  2md (FFR 0.90->Med Rx), RPDA 50, RPLA 70, EF 60%.  .Marland KitchenCOPD (chronic obstructive pulmonary disease) (HCC)    Dr FRaul Del . Former tobacco use   . Gastroesophageal reflux disease   . HTN (hypertension)   . Hyperlipidemia   . Hypokalemia   . Malignant [place] of lung [place]  . [place] infarction ([place] 04[place]   Dr MSherren Mocha . Stented coronary artery     SURGICAL HISTORY: Past Surgical History:  Procedure Laterality Date  . CARDIAC CATHETERIZATION    . CHOLECYSTECTOMY N/A 02/15/2016   Procedure: LAPAROSCOPIC CHOLECYSTECTOMY WITH INTRAOPERATIVE CHOLANGIOGRAM;  Surgeon: JLeonie Green MD;  Location: [facility] ORS;  Service: General;  Laterality: N/A;  . COLONOSCOPY    . COLONOSCOPY WITH PROPOFOL N/A 08/02/2015   Procedure: COLONOSCOPY WITH PROPOFOL;  Surgeon: RManya Silvas MD;  [facility];  Service: Endoscopy;  Laterality: N/A;  . CORONARY ANGIOPLASTY  04/2014   STENT  . ENDOBRONCHIAL ULTRASOUND N/A 03/26/2018   Procedure: ENDOBRONCHIAL ULTRASOUND;  Surgeon: KFlora Lipps MD;  Location: [facility] ORS;  Service: Cardiopulmonary;  Laterality: N/A;  . ESOPHAGOGASTRODUODENOSCOPY (EGD) WITH PROPOFOL N/A 08/02/2015   Procedure: ESOPHAGOGASTRODUODENOSCOPY (EGD) WITH PROPOFOL;  Surgeon: RManya Silvas MD;  [facility];  Service: Endoscopy;  Laterality: N/A;  . FLEXIBLE BRONCHOSCOPY N/A 03/26/2018   Procedure: FLEXIBLE BRONCHOSCOPY;  Surgeon: KFlora Lipps MD;  Location: [facility] ORS;  Service: Cardiopulmonary;  Laterality: N/A;  . KNEE ARTHROSCOPY Left   . LEFT HEART CATHETERIZATION WITH CORONARY ANGIOGRAM N/A 05/03/2014   Procedure: LEFT HEART CATHETERIZATION WITH CORONARY ANGIOGRAM;  Surgeon: MBlane Ohara MD;  [facility];  Service: Cardiovascular;  Laterality: N/A;  . PERCUTANEOUS CORONARY STENT INTERVENTION (PCI-S) N/A 05/05/2014   Procedure: PERCUTANEOUS CORONARY STENT INTERVENTION (PCI-S);  Surgeon: Peter M JMartinique  MD;  [facility];  Service: Cardiovascular;  Laterality: N/A;  . PORTACATH PLACEMENT Left 04/04/2018   Procedure: INSERTION PORT-A-CATH;  Surgeon: ONestor Lewandowsky MD;  Location: [facility] ORS;  Service: General;  Laterality: Left;    SOCIAL HISTORY: Social History   Socioeconomic History  . Marital status: Married    Spouse name: Not on file  . Number of children: Not on file  . Years of education: Not on file  . Highest education level: Not on file  Occupational History  . Not on file  Tobacco Use  . Smoking status: Former Smoker    Packs/day: 1.00    Years: 40.00    Pack years: 40.00    Types: Cigarettes    Quit date: 02/14/2011    Years since quitting: 8.1  . Smokeless tobacco: Never Used  Substance and Sexual Activity  . Alcohol use: No  . Drug use: No  . Sexual activity: Not on file  Other Topics Concern  . Not on file  Social History Narrative   The [place]. [place]. He quit smoking in 2013.   Social Determinants of Health   Financial Resource Strain:   . Difficulty of Paying Living Expenses: Not on file  Food Insecurity:   . Worried About RCharity fundraiserin the Last Year: Not on file  . Ran Out of Food in the Last Year: Not on file  Transportation Needs:   . Lack of Transportation (Medical): Not on file  . Lack of Transportation (Non-Medical): Not on file  Physical Activity:   . Days of Exercise per Week: Not on file  . Minutes  of Exercise per Session: Not on file  Stress:   . Feeling of Stress : Not on file  Social Connections:   . Frequency of Communication with Friends and Family: Not on file  . Frequency of Social Gatherings with Friends and Family: Not on file  . Attends Religious Services: Not on file  . Active Member of Clubs or Organizations: Not on file  . Attends Archivist Meetings: Not on file  . Marital Status: Not on file  Intimate Partner Violence:   . Fear of Current or  Ex-Partner: Not on file  . Emotionally Abused: Not on file  . Physically Abused: Not on file  . Sexually Abused: Not on file    FAMILY HISTORY: Family History  Problem Relation Age of Onset  . CAD Mother 44       Died of an MI  . CAD Father 36       Died of a massive MI  . COPD Sister   . Lung cancer Brother   . Bone cancer Paternal Uncle   . Colon cancer Neg Hx   . Breast cancer Neg Hx     ALLERGIES:  has No Known Allergies.  MEDICATIONS:  Current Outpatient Medications  Medication Sig Dispense Refill  . acetaminophen (TYLENOL) 500 MG tablet Take 1,000 mg by mouth every 6 (six) hours as needed (for pain.).    Marland Kitchen albuterol (PROAIR HFA) 108 (90 Base) MCG/ACT inhaler Inhale 2 puffs into the lungs every 6 (six) hours as needed for wheezing or shortness of breath.     [name] ELLIPTA 62.5-25 MCG/INH AEPB INHALE 1 PUFF INTO THE LUNGS DAILY 180 each 0  . aspirin EC 81 MG tablet Take 81 mg by mouth daily.    Marland Kitchen atorvastatin (LIPITOR) 80 MG tablet Take 1 tablet (80 mg total) by mouth daily at 6 PM. 90 tablet 3  . carvedilol (COREG) 6.25 MG tablet Take 1 tablet (6.25 mg total) by mouth 2 (two) times daily. 180 tablet 3  . colestipol (COLESTID) 1 g tablet Take 2 g by mouth as needed (for stomach issues).     . folic acid (FOLVITE) 1 MG tablet Take 1 tablet (1 mg total) by mouth daily. 90 tablet 1  . lidocaine-prilocaine (EMLA) cream Apply small amount to port site 1 hour prior to [facility] being accessed and cover with plastic wrap 30 g 1  . nitroGLYCERIN (NITROSTAT) 0.4 MG SL tablet Place 1 tablet (0.4 mg total) under the tongue every 5 (five) minutes as needed for chest pain. X 3 doses 25 tablet 3  . omeprazole (PRILOSEC) 40 MG capsule TAKE 1 CAPSULE BY MOUTH TWICE A DAY 180 capsule 1  . ondansetron (ZOFRAN) 8 MG tablet PLEASE SEE ATTACHED FOR DETAILED DIRECTIONS    . oxyCODONE-acetaminophen (PERCOCET/ROXICET) 5-325 MG tablet Take by mouth.    . potassium chloride SA (KLOR-CON M20) 20 MEQ  tablet Take 1 tablet (20 mEq total) by mouth daily. 90 tablet 3  . sucralfate (CARAFATE) 1 g tablet Take 1 tablet (1 g total) by mouth 3 (three) times daily before meals. Dissolve in warm water, swish and swallow 90 tablet 6  . tamsulosin (FLOMAX) 0.4 MG CAPS capsule Take 0.4 mg by mouth daily after supper.    . traZODone (DESYREL) 50 MG tablet Take 1 tablet (50 mg total) by mouth at bedtime. 90 tablet 0  . vitamin B-12 (CYANOCOBALAMIN) 1000 MCG tablet Take 1 tablet (1,000 mcg total) by mouth daily. 90 tablet  1   No current facility-administered medications for this visit.     PHYSICAL EXAMINATION: ECOG PERFORMANCE STATUS: 0 - Asymptomatic There were no vitals filed for this visit. There were no vitals filed for this visit.  Physical Exam Constitutional:      General: He is not in acute distress. HENT:     Head: Normocephalic and atraumatic.  Eyes:     General: No scleral icterus.    Pupils: Pupils are equal, round, and reactive to light.  Cardiovascular:     Rate and Rhythm: Normal rate and regular rhythm.     Heart sounds: Normal heart sounds.  Pulmonary:     Effort: Pulmonary effort is normal. No respiratory distress.     Breath sounds: No wheezing.  Abdominal:     General: Bowel sounds are normal. There is no distension.     Palpations: Abdomen is soft. There is no mass.     Tenderness: There is no abdominal tenderness.  Musculoskeletal:        General: No deformity. Normal range of motion.     Cervical back: Normal range of motion and neck supple.  Skin:    General: Skin is warm and dry.     Findings: No erythema or rash.  Neurological:     Mental Status: He is alert and oriented to person, place, and time.     Cranial Nerves: No cranial nerve deficit.     Coordination: Coordination normal.  Psychiatric:        Behavior: Behavior normal.        Thought Content: Thought content normal.      LABORATORY DATA:  I have reviewed the data as listed Lab Results    Component Value Date   WBC 5.4 03/24/2019   HGB 13.4 03/24/2019   HCT 40.4 03/24/2019   MCV 92.9 03/24/2019   PLT 151 03/24/2019   Recent Labs    02/24/19 0856 03/10/19 0850 03/24/19 0856  NA 138 134* 135  K 3.8 3.8 3.4*  CL 106 105 105  CO2 _0 GLUCOSE 123* 100* 104*  BUN _1 CREATININE 1.02 1.03 0.97  CALCIUM 8.5* 8.6* 8.7*  GFRNONAA >60 >60 >60  GFRAA >60 >60 >60  PROT 6.5 6.7 6.7  ALBUMIN 3.7 3.9 3.7  AST _2 ALT _3 ALKPHOS 160* 146* 124  BILITOT 0.6 0.8 0.7   Iron/TIBC/Ferritin/ %Sat    Component Value Date/Time   IRON 41 (L) 11/21/2018 0856   TIBC 260 11/21/2018 0856   FERRITIN 150 11/21/2018 0856   IRONPCTSAT 16 (L) 11/21/2018 0856    RADIOGRAPHIC STUDIES: I have personally reviewed the radiological images as listed and agreed with the findings in the report.  CT Chest W Contrast  Result Date: 01/20/2019 CLINICAL DATA:  Follow-up non-small cell lung cancer EXAM: CT CHEST WITH CONTRAST TECHNIQUE: Multidetector CT imaging of the chest was performed during intravenous contrast administration. CONTRAST:  63m OMNIPAQUE IOHEXOL 300 MG/ML  SOLN COMPARISON:  10/15/2018 FINDINGS: Cardiovascular: Heart is normal in size.  No pericardial effusion. No evidence thoracic aortic aneurysm. Atherosclerotic calcifications of the aortic arch. Coronary atherosclerosis of the LAD. Left chest port terminates in the mid SVC. Mediastinum/Nodes: No suspicious mediastinal lymphadenopathy. Visualized thyroid is unremarkable. Lungs/Pleura: Biapical pleural-parenchymal scarring. Moderate centrilobular and paraseptal emphysematous changes, upper lung predominant. Radiation changes in the medial right lower lobe (series 3/image 122), improved. Additional platelike radiation changes inferiorly in the right middle lobe.  Linear scarring/atelectasis in the medial right upper lobe. Mild subpleural patchy opacities in the lungs bilaterally with mild mosaic attenuation in the  right middle and lower lobes (for example, series 3/image 70, suggesting mild infectious/inflammatory pneumonitis. No pleural effusion or pneumothorax. Upper Abdomen: Visualized upper abdomen is notable for prior cholecystectomy and mild vascular calcifications. Musculoskeletal: Visualized osseous structures are within normal limits. IMPRESSION: Radiation changes in the medial right lower lobe, improved. No findings specific for recurrent or metastatic disease. Mild subpleural patchy opacities in the lungs bilaterally, suggesting mild infection/inflammatory pneumonitis. Aortic Atherosclerosis (ICD10-I70.0) and Emphysema (ICD10-J43.9). Electronically Signed   By: Julian Hy M.D.   On: 01/20/2019 10:47     ASSESSMENT & PLAN:  1. Malignant neoplasm of lower lobe of right lung ([place])   2. Encounter for antineoplastic immunotherapy   3. Other fatigue   4. Elevated alkaline phosphatase level   Cancer Staging Malignant neoplasm of lower lobe of right lung [facility]) Staging form: Lung, AJCC 8th Edition - Clinical stage from 05/13/2018: Stage IIIA (cT1c, cN2, cM0) - Signed by Earlie Server, MD on 05/13/2018  #Stage IIIA lung adenocarcinoma Labs are reviewed and discussed with patient. Clinically he is doing well on immunotherapy maintenance. Counts acceptable to proceed with durvalumab treatment today CT surgery plans at the end of December.  #Elevated alkaline phosphatase, counts are trending down.  Labs reviewed. #COPD, continue follow-up with pulmonology.  Continue Anoro #Chronic fatigue.  Cortisol pending.  TSH has been monitored and has been normal. Follow-up in 2 weeks for evaluation prior to next cycle of durvalumab treatments.   Earlie Server, MD, PhD 04/07/19

## 2019-04-15 ENCOUNTER — Telehealth: Payer: Self-pay

## 2019-04-15 NOTE — Telephone Encounter (Signed)
Patient informed.  Rescheduling the infusion appt to the first week in January 2021 due to current insurance is denying and Timothy Yoder will check authorization with his insurance after the new year.

## 2019-04-21 ENCOUNTER — Inpatient Hospital Stay: Payer: BC Managed Care – PPO

## 2019-04-21 ENCOUNTER — Inpatient Hospital Stay: Payer: BC Managed Care – PPO | Admitting: Oncology

## 2019-04-22 ENCOUNTER — Ambulatory Visit: Payer: BC Managed Care – PPO | Admitting: Oncology

## 2019-04-22 ENCOUNTER — Other Ambulatory Visit: Payer: BC Managed Care – PPO

## 2019-04-22 ENCOUNTER — Ambulatory Visit: Payer: BC Managed Care – PPO

## 2019-04-23 ENCOUNTER — Other Ambulatory Visit: Payer: Self-pay

## 2019-04-23 ENCOUNTER — Ambulatory Visit
Admission: RE | Admit: 2019-04-23 | Discharge: 2019-04-23 | Disposition: A | Discharge status: Home / Self Care | Payer: BC Managed Care – PPO | Source: Ambulatory Visit | Attending: Oncology | Admitting: Oncology

## 2019-04-23 DIAGNOSIS — C3431 Malignant neoplasm of lower lobe, right bronchus or lung: Secondary | ICD-10-CM | POA: Diagnosis not present

## 2019-04-28 ENCOUNTER — Inpatient Hospital Stay: Payer: BC Managed Care – PPO | Admitting: Oncology

## 2019-04-28 ENCOUNTER — Encounter: Payer: Self-pay | Admitting: *Deleted

## 2019-04-28 ENCOUNTER — Inpatient Hospital Stay: Payer: BC Managed Care – PPO

## 2019-05-01 ENCOUNTER — Inpatient Hospital Stay: Payer: BC Managed Care – PPO | Admitting: Oncology

## 2019-05-01 ENCOUNTER — Inpatient Hospital Stay: Payer: BC Managed Care – PPO

## 2019-05-01 ENCOUNTER — Encounter: Payer: Self-pay | Admitting: Oncology

## 2019-05-02 ENCOUNTER — Other Ambulatory Visit: Payer: Self-pay

## 2019-05-05 ENCOUNTER — Inpatient Hospital Stay: Payer: Medicare Other

## 2019-05-05 ENCOUNTER — Encounter: Payer: Self-pay | Admitting: Oncology

## 2019-05-05 ENCOUNTER — Inpatient Hospital Stay: Payer: Medicare Other | Attending: Oncology | Admitting: Oncology

## 2019-05-05 ENCOUNTER — Other Ambulatory Visit: Payer: Self-pay

## 2019-05-05 VITALS — BP 138/80 | HR 60 | Temp 96.7°F | Resp 18 | Wt 158.8 lb

## 2019-05-05 DIAGNOSIS — J449 Chronic obstructive pulmonary disease, unspecified: Secondary | ICD-10-CM

## 2019-05-05 DIAGNOSIS — Z79899 Other long term (current) drug therapy: Secondary | ICD-10-CM | POA: Insufficient documentation

## 2019-05-05 DIAGNOSIS — I10 Essential (primary) hypertension: Secondary | ICD-10-CM | POA: Diagnosis not present

## 2019-05-05 DIAGNOSIS — R748 Abnormal levels of other serum enzymes: Secondary | ICD-10-CM | POA: Diagnosis not present

## 2019-05-05 DIAGNOSIS — C3431 Malignant neoplasm of lower lobe, right bronchus or lung: Secondary | ICD-10-CM

## 2019-05-05 DIAGNOSIS — R5383 Other fatigue: Secondary | ICD-10-CM

## 2019-05-05 DIAGNOSIS — Z5112 Encounter for antineoplastic immunotherapy: Secondary | ICD-10-CM | POA: Diagnosis not present

## 2019-05-05 DIAGNOSIS — R5382 Chronic fatigue, unspecified: Secondary | ICD-10-CM | POA: Insufficient documentation

## 2019-05-05 DIAGNOSIS — Z95828 Presence of other vascular implants and grafts: Secondary | ICD-10-CM

## 2019-05-05 LAB — COMPREHENSIVE METABOLIC PANEL
ALT: 26 U/L (ref 0–44)
AST: 22 U/L (ref 15–41)
Albumin: 3.8 g/dL (ref 3.5–5.0)
Alkaline Phosphatase: 132 U/L — ABNORMAL HIGH (ref 38–126)
Anion gap: 8 (ref 5–15)
BUN: 9 mg/dL (ref 8–23)
CO2: 25 mmol/L (ref 22–32)
Calcium: 8.9 mg/dL (ref 8.9–10.3)
Chloride: 105 mmol/L (ref 98–111)
Creatinine, Ser: 1.01 mg/dL (ref 0.61–1.24)
GFR calc Af Amer: >60 mL/min (ref >60)
GFR calc non Af Amer: >60 mL/min (ref >60)
Glucose, Bld: 129 mg/dL — ABNORMAL HIGH (ref 70–99)
Potassium: 3.6 mmol/L (ref 3.5–5.1)
Sodium: 138 mmol/L (ref 135–145)
Total Bilirubin: 0.7 mg/dL (ref 0.3–1.2)
Total Protein: 6.9 g/dL (ref 6.5–8.1)

## 2019-05-05 LAB — CBC WITH DIFFERENTIAL/PLATELET
Abs Immature Granulocytes: 0.05 10*3/uL (ref 0.00–0.07)
Basophils Absolute: 0 10*3/uL (ref 0.0–0.1)
Basophils Relative: 1 %
Eosinophils Absolute: 0.1 10*3/uL (ref 0.0–0.5)
Eosinophils Relative: 2 %
HCT: 43.5 % (ref 39.0–52.0)
Hemoglobin: 14.5 g/dL (ref 13.0–17.0)
Immature Granulocytes: 1 %
Lymphocytes Relative: 18 %
Lymphs Abs: 1 10*3/uL (ref 0.7–4.0)
MCH: 31.1 pg (ref 26.0–34.0)
MCHC: 33.3 g/dL (ref 30.0–36.0)
MCV: 93.3 fL (ref 80.0–100.0)
Monocytes Absolute: 0.5 10*3/uL (ref 0.1–1.0)
Monocytes Relative: 9 %
Neutro Abs: 3.8 10*3/uL (ref 1.7–7.7)
Neutrophils Relative %: 69 %
Platelets: 175 10*3/uL (ref 150–400)
RBC: 4.66 MIL/uL (ref 4.22–5.81)
RDW: 13.5 % (ref 11.5–15.5)
WBC: 5.5 10*3/uL (ref 4.0–10.5)
nRBC: 0 % (ref 0.0–0.2)

## 2019-05-05 LAB — TSH: TSH: 1.674 u[IU]/mL (ref 0.350–4.500)

## 2019-05-05 MED ORDER — SODIUM CHLORIDE 0.9 % IV SOLN
Freq: Once | INTRAVENOUS | Status: AC
  Filled 2019-05-05: qty 250

## 2019-05-05 MED ORDER — SODIUM CHLORIDE 0.9% FLUSH
10.0000 mL | Freq: Once | INTRAVENOUS | Status: AC
  Administered 2019-05-05: 10 mL via INTRAVENOUS
  Filled 2019-05-05: qty 10

## 2019-05-05 MED ORDER — SODIUM CHLORIDE 0.9 % IV SOLN
10.0000 mg/kg | Freq: Once | INTRAVENOUS | Status: AC
  Administered 2019-05-05: 11:00:00 700 mg via INTRAVENOUS
  Filled 2019-05-05: qty 10

## 2019-05-05 MED ORDER — HEPARIN SOD (PORK) LOCK FLUSH 100 UNIT/ML IV SOLN
500.0000 [IU] | Freq: Once | INTRAVENOUS | Status: AC | PRN
  Administered 2019-05-05: 12:00:00 500 [IU]
  Filled 2019-05-05: qty 5

## 2019-05-05 NOTE — Progress Notes (Signed)
Hematology/Oncology follow up note [facility] [phone] [phone]   Patient Care Team: Baxter Hire, MD as PCP - General (Internal Medicine) Sherren Mocha, MD as PCP - Cardiology (Cardiology) Telford Nab, RN as Registered Nurse  REFERRING PROVIDER: Dr.Fleming REASON FOR VISIT:  Follow-up for management of stage III non-small cell lung cancer   HISTORY OF PRESENTING ILLNESS:  Timothy Yoder is a  64 y.o.  male with PMH listed below who was referred to me for evaluation of lung mass.  Patient is a former smoker quit smoking 7 years ago.  40-pack-year. Patient chest lung cancer screening on 03/11/2018. CT scan showed 2.4 cm right lower lobe nodule with a new necrotic.  Subcarinal lymph node. PET scan showed hypermetabolic right lower pulmonary nodule, consistent with primary bronchogenic carcinoma.  Fluid density subcarinal structure demonstrate no significant hypermetabolism.  Although this could represent an incidental benign lesion such as a cyst given the interval development since 02/24/2017, necrotic adenopathy cannot be excluded and tissue sampling should be considered.  Patient was seen by Dr. Faith Rogue for initial evaluation on 03/13/2018.  Patient has not got PET scan at that time. Today patient was accompanied by his wife to the oncology clinic to discuss management plan.  And PET scan results. Patient reports chronic shortness of breath with moderate exertion.  Appetite is good denies any fever, chills, hemoptysis, weight loss.  # underwent biopsy via bronchoscopy.  Subcarinal lymph node biopsy positive for non-small cell lung cancer, favoring poorly differentiated adenocarcinoma. #Mediport was placed by Dr. Genevive Bi  # #Elevated alkaline phosphatase. Patient has been evaluated by gastroenterology.  Liver biopsy showed mild sinusoidal dilation and congestion.  Minimal changes suggestive of hepatic process. Iron stain demonstrate  minimal focal hepatocellular iron. I discussed with Dr.Wohl via secure chat about patient's pathology result.  No restriction of proceeding with immunotherapy.  Continue watch for the labs.   # Cancer Treatment 04/26/2018 -06/11/2018 Concurrent Chemotherapy [weekly Carbo and Taxol] with RT.  07/10/2018 Started on maintenance Durvalumab.  INTERVAL HISTORY FERLIN Yoder is a 65 y.o. male who has above history reviewed by me today presents for follow up visit for management of stage III non-small cell lung cancer.   Patient has been on maintenance immunotherapy. Patient tolerates well.  He has no new complaints today. Denies any fever, chills, nausea, vomiting, diarrhea. Chronic fatigue is stable.  Unchanged.  Patient reports that he chatted with a friend whose mother later was confirmed to be Covid positive.  Both him and his friend wore masks and kept about 6 feet distance..   Review of Systems  Constitutional: Positive for fatigue. Negative for appetite change, chills, fever and unexpected weight change.  HENT:   Negative for hearing loss and voice change.   Eyes: Negative for eye problems and icterus.  Respiratory: Negative for chest tightness, cough and shortness of breath.   Cardiovascular: Negative for chest pain and leg swelling.  Gastrointestinal: Negative for abdominal distention and abdominal pain.  Endocrine: Negative for hot flashes.  Genitourinary: Negative for difficulty urinating, dysuria and frequency.   Musculoskeletal: Negative for arthralgias.  Skin: Negative for itching and rash.  Neurological: Negative for light-headedness and numbness.  Hematological: Negative for adenopathy. Does not bruise/bleed easily.  Psychiatric/Behavioral: Negative for confusion.   MEDICAL HISTORY:  Past Medical History:  Diagnosis Date  . BPH (benign prostatic hyperplasia)   . CAD (coronary artery disease)    a. 04/2014 anterolateral STEMI/PCI: LM nl, LAD 15m(2.75x16 Promus  DES, D1 50p,  LCX nl, RI nl, OM1 100 small/chronic, RCA dominant, 20-53m 764m (FFR 0.90->Med Rx), RPDA 50, RPLA 70, EF 60%.  . Marland KitchenOPD (chronic obstructive pulmonary disease) (HCC)    Dr FlRaul Del. Former tobacco use   . Gastroesophageal reflux disease   . HTN (hypertension)   . Hyperlipidemia   . Hypokalemia   . [place]  . [place]   Dr MiSherren Mocha. Stented coronary artery     SURGICAL HISTORY: Past Surgical History:  Procedure Laterality Date  . CARDIAC CATHETERIZATION    . CHOLECYSTECTOMY N/A 02/15/2016   Procedure: LAPAROSCOPIC CHOLECYSTECTOMY WITH INTRAOPERATIVE CHOLANGIOGRAM;  Surgeon: JaLeonie GreenMD;  Location: [facility] ORS;  Service: General;  Laterality: N/A;  . COLONOSCOPY    . COLONOSCOPY WITH PROPOFOL N/A 08/02/2015   Procedure: COLONOSCOPY WITH PROPOFOL;  Surgeon: RoManya SilvasMD;  [facility];  Service: Endoscopy;  Laterality: N/A;  . CORONARY ANGIOPLASTY  04/2014   STENT  . ENDOBRONCHIAL ULTRASOUND N/A 03/26/2018   Procedure: ENDOBRONCHIAL ULTRASOUND;  Surgeon: KaFlora LippsMD;  Location: [facility] ORS;  Service: Cardiopulmonary;  Laterality: N/A;  . ESOPHAGOGASTRODUODENOSCOPY (EGD) WITH PROPOFOL N/A 08/02/2015   Procedure: ESOPHAGOGASTRODUODENOSCOPY (EGD) WITH PROPOFOL;  Surgeon: RoManya SilvasMD;  [facility];  Service: Endoscopy;  Laterality: N/A;  . FLEXIBLE BRONCHOSCOPY N/A 03/26/2018   Procedure: FLEXIBLE BRONCHOSCOPY;  Surgeon: KaFlora LippsMD;  Location: [facility] ORS;  Service: Cardiopulmonary;  Laterality: N/A;  . KNEE ARTHROSCOPY Left   . LEFT HEART CATHETERIZATION WITH CORONARY ANGIOGRAM N/A 05/03/2014   Procedure: LEFT HEART CATHETERIZATION WITH CORONARY ANGIOGRAM;  Surgeon: MiBlane OharaMD;  [facility];  Service: Cardiovascular;  Laterality: N/A;  . PERCUTANEOUS CORONARY STENT INTERVENTION (PCI-S) N/A 05/05/2014   Procedure: PERCUTANEOUS CORONARY STENT  INTERVENTION (PCI-S);  Surgeon: Peter M JoMartiniqueMD;  [facility];  Service: Cardiovascular;  Laterality: N/A;  . PORTACATH PLACEMENT Left 04/04/2018   Procedure: INSERTION PORT-A-CATH;  Surgeon: OaNestor LewandowskyMD;  Location: [facility] ORS;  Service: General;  Laterality: Left;    SOCIAL HISTORY: Social History   Socioeconomic History  . Marital status: Married    Spouse name: Not on file  . Number of children: Not on file  . Years of education: Not on file  . Highest education level: Not on file  Occupational History  . Not on file  Tobacco Use  . Smoking status: Former Smoker    Packs/day: 1.00    Years: 40.00    Pack years: 40.00    Types: Cigarettes    Quit date: 02/14/2011    Years since quitting: 8.2  . Smokeless tobacco: Never Used  Substance and Sexual Activity  . Alcohol use: No  . Drug use: No  . Sexual activity: Not on file  Other Topics Concern  . Not on file  Social History Narrative   The [place] is married. [place] He quit smoking in 2013.   Social Determinants of Health   Financial Resource Strain:   . Difficulty of Paying Living Expenses: Not on file  Food Insecurity:   . Worried About RuCharity fundraisern the Last Year: Not on file  . Ran Out of Food in the Last Year: Not on file  Transportation Needs:   . Lack of Transportation (Medical): Not on file  . Lack of Transportation (Non-Medical): Not on file  Physical Activity:   .  Days of Exercise per Week: Not on file  . Minutes of Exercise per Session: Not on file  Stress:   . Feeling of Stress : Not on file  Social Connections:   . Frequency of Communication with Friends and Family: Not on file  . Frequency of Social Gatherings with Friends and Family: Not on file  . Attends Religious Services: Not on file  . Active Member of Clubs or Organizations: Not on file  . Attends Archivist Meetings: Not on file  . Marital Status: Not on file    Intimate Partner Violence:   . Fear of Current or Ex-Partner: Not on file  . Emotionally Abused: Not on file  . Physically Abused: Not on file  . Sexually Abused: Not on file    FAMILY HISTORY: Family History  Problem Relation Age of Onset  . CAD Mother 63       Died of an MI  . CAD Father 55       Died of a massive MI  . COPD Sister   . Lung cancer Brother   . Bone cancer Paternal Uncle   . Colon cancer Neg Hx   . Breast cancer Neg Hx     ALLERGIES:  has No Known Allergies.  MEDICATIONS:  Current Outpatient Medications  Medication Sig Dispense Refill  . acetaminophen (TYLENOL) 500 MG tablet Take 1,000 mg by mouth every 6 (six) hours as needed (for pain.).    Marland Kitchen albuterol (PROAIR HFA) 108 (90 Base) MCG/ACT inhaler Inhale 2 puffs into the lungs every 6 (six) hours as needed for wheezing or shortness of breath.     [name] ELLIPTA 62.5-25 MCG/INH AEPB INHALE 1 PUFF INTO THE LUNGS DAILY 180 each 0  . aspirin EC 81 MG tablet Take 81 mg by mouth daily.    Marland Kitchen atorvastatin (LIPITOR) 80 MG tablet Take 1 tablet (80 mg total) by mouth daily at 6 PM. 90 tablet 3  . carvedilol (COREG) 6.25 MG tablet Take 1 tablet (6.25 mg total) by mouth 2 (two) times daily. 180 tablet 3  . finasteride (PROSCAR) 5 MG tablet Take by mouth.    . folic acid (FOLVITE) 1 MG tablet Take 1 tablet (1 mg total) by mouth daily. 90 tablet 1  . lidocaine-prilocaine (EMLA) cream Apply small amount to port site 1 hour prior to [facility] being accessed and cover with plastic wrap 30 g 1  . nitroGLYCERIN (NITROSTAT) 0.4 MG SL tablet Place 1 tablet (0.4 mg total) under the tongue every 5 (five) minutes as needed for chest pain. X 3 doses 25 tablet 3  . omeprazole (PRILOSEC) 40 MG capsule Take 1 capsule (40 mg total) by mouth 2 (two) times daily. 180 capsule 1  . ondansetron (ZOFRAN) 8 MG tablet PLEASE SEE ATTACHED FOR DETAILED DIRECTIONS    . potassium chloride SA (KLOR-CON M20) 20 MEQ tablet Take 1 tablet (20 mEq total) by  mouth daily. 90 tablet 3  . sucralfate (CARAFATE) 1 g tablet Take 1 tablet (1 g total) by mouth 3 (three) times daily before meals. Dissolve in warm water, swish and swallow 90 tablet 6  . tamsulosin (FLOMAX) 0.4 MG CAPS capsule Take 0.4 mg by mouth daily after supper.    . traZODone (DESYREL) 50 MG tablet Take 1 tablet (50 mg total) by mouth at bedtime. 90 tablet 0  . vitamin B-12 (CYANOCOBALAMIN) 1000 MCG tablet Take 1 tablet (1,000 mcg total) by mouth daily. 90 tablet 1  . oxyCODONE-acetaminophen (  PERCOCET/ROXICET) 5-325 MG tablet Take by mouth.     No current facility-administered medications for this visit.     PHYSICAL EXAMINATION: ECOG PERFORMANCE STATUS: 0 - Asymptomatic Vitals:   05/05/19 0937  BP: 138/80  Pulse: 60  Resp: 18  Temp: (!) 96.7 F (35.9 C)   Filed Weights   05/05/19 0937  Weight: 158 lb 12.8 oz (72 kg)    Physical Exam Constitutional:      General: He is not in acute distress. HENT:     Head: Normocephalic and atraumatic.  Eyes:     General: No scleral icterus.    Pupils: Pupils are equal, round, and reactive to light.  Cardiovascular:     Rate and Rhythm: Normal rate and regular rhythm.     Heart sounds: Normal heart sounds.  Pulmonary:     Effort: Pulmonary effort is normal. No respiratory distress.     Breath sounds: No wheezing.  Abdominal:     General: Bowel sounds are normal. There is no distension.     Palpations: Abdomen is soft. There is no mass.     Tenderness: There is no abdominal tenderness.  Musculoskeletal:        General: No deformity. Normal range of motion.     Cervical back: Normal range of motion and neck supple.  Skin:    General: Skin is warm and dry.     Findings: No erythema or rash.  Neurological:     Mental Status: He is alert and oriented to person, place, and time.     Cranial Nerves: No cranial nerve deficit.     Coordination: Coordination normal.  Psychiatric:        Behavior: Behavior normal.        Thought  Content: Thought content normal.      LABORATORY DATA:  I have reviewed the data as listed Lab Results  Component Value Date   WBC 5.5 05/05/2019   HGB 14.5 05/05/2019   HCT 43.5 05/05/2019   MCV 93.3 05/05/2019   PLT 175 05/05/2019   Recent Labs    03/24/19 0856 04/07/19 0832 05/05/19 0909  NA 135 140 138  K 3.4* 3.7 3.6  CL 105 106 105  CO2 _0 GLUCOSE 104* 106* 129*  BUN _1 CREATININE 0.97 1.01 1.01  CALCIUM 8.7* 8.8* 8.9  GFRNONAA >60 >60 >60  GFRAA >60 >60 >60  PROT 6.7 6.6 6.9  ALBUMIN 3.7 3.7 3.8  AST _2 ALT _3 ALKPHOS 124 126 132*  BILITOT 0.7 0.6 0.7   Iron/TIBC/Ferritin/ %Sat    Component Value Date/Time   IRON 41 (L) 11/21/2018 0856   TIBC 260 11/21/2018 0856   FERRITIN 150 11/21/2018 0856   IRONPCTSAT 16 (L) 11/21/2018 0856    RADIOGRAPHIC STUDIES: I have personally reviewed the radiological images as listed and agreed with the findings in the report.  CT Chest Wo Contrast  Result Date: 04/23/2019 CLINICAL DATA:  Stage IIIA right lower lobe lung adenocarcinoma status post concurrent chemotherapy with RT and maintenance immunotherapy. Restaging. EXAM: CT CHEST WITHOUT CONTRAST TECHNIQUE: Multidetector CT imaging of the chest was performed following the standard protocol without IV contrast. COMPARISON:  01/20/2019 chest CT. FINDINGS: Cardiovascular: Normal heart size. No significant pericardial effusion/thickening. Three-vessel coronary atherosclerosis. Left subclavian Port-A-Cath terminates in lower third of the SVC. Atherosclerotic nonaneurysmal thoracic aorta. Normal caliber pulmonary arteries. Mediastinum/Nodes: No discrete thyroid nodules. Unremarkable esophagus. No pathologically enlarged axillary,  mediastinal or hilar lymph nodes, noting limited sensitivity for the detection of hilar adenopathy on this noncontrast study. Lungs/Pleura: No pneumothorax. No pleural effusion. Moderate centrilobular emphysema. Masslike fibrosis  in the medial right lower lobe measures 3.5 x 1.7 cm (series 3/image 122), previously 3.6 x 2.1 cm, slightly decreased, with decreasing surrounding faint patchy ground-glass opacities. No acute consolidative airspace disease or new significant pulmonary nodules. Scattered calcified subcentimeter granulomas in both lungs are unchanged. Upper abdomen: Small hiatal hernia.  Cholecystectomy. Musculoskeletal:  No aggressive appearing focal osseous lesions. IMPRESSION: 1. Continued expected evolution of postradiation changes in the medial right lower lobe, with no evidence of local tumor recurrence. 2. No findings of metastatic disease in the chest. Aortic Atherosclerosis (ICD10-I70.0) and Emphysema (ICD10-J43.9). Electronically Signed   By: Ilona Sorrel M.D.   On: 04/23/2019 09:12     ASSESSMENT & PLAN:  1. Malignant neoplasm of lower lobe of right lung ([place])   2. Encounter for antineoplastic immunotherapy   3. Elevated alkaline phosphatase level   4. Chronic obstructive pulmonary disease, unspecified COPD type ([place])   5. Other fatigue   Cancer Staging Malignant neoplasm of lower lobe of right lung [facility]) Staging form: Lung, AJCC 8th Edition - Clinical stage from 05/13/2018: Stage IIIA (cT1c, cN2, cM0) - Signed by Earlie Server, MD on 05/13/2018  #Stage IIIA lung adenocarcinoma Labs are reviewed and discussed with patient. Clinically he is doing well on immunotherapy maintenance. Counts are acceptable to proceed with durvalumab treatments today. CT images were independently reviewed by me and discussed with patient. No recurrence or metastatic disease. Continued expected evolution of post radiation changes.  Elevated alkaline phosphatase, levels at 132.  Continue to monitor.  #COPD, continue follow-up with pulmonology.  Continue Anoro #Chronic fatigue.  Cortisol level was normal.  TSH has been monitored and has been normal. Follow-up in 2 weeks evaluation prior to next cycle of durvalumab  treatments.   Earlie Server, MD, PhD 05/05/19

## 2019-05-05 NOTE — Progress Notes (Signed)
Patient does not offer any problems today.  

## 2019-05-19 ENCOUNTER — Inpatient Hospital Stay: Payer: Medicare Other

## 2019-05-19 ENCOUNTER — Encounter: Payer: Self-pay | Admitting: Oncology

## 2019-05-19 ENCOUNTER — Inpatient Hospital Stay (HOSPITAL_BASED_OUTPATIENT_CLINIC_OR_DEPARTMENT_OTHER): Payer: Medicare Other | Admitting: Oncology

## 2019-05-19 ENCOUNTER — Other Ambulatory Visit: Payer: Self-pay

## 2019-05-19 VITALS — BP 122/81 | HR 65 | Temp 97.6°F | Resp 16 | Wt 161.2 lb

## 2019-05-19 DIAGNOSIS — J449 Chronic obstructive pulmonary disease, unspecified: Secondary | ICD-10-CM

## 2019-05-19 DIAGNOSIS — R748 Abnormal levels of other serum enzymes: Secondary | ICD-10-CM | POA: Diagnosis not present

## 2019-05-19 DIAGNOSIS — C3431 Malignant neoplasm of lower lobe, right bronchus or lung: Secondary | ICD-10-CM

## 2019-05-19 DIAGNOSIS — Z5112 Encounter for antineoplastic immunotherapy: Secondary | ICD-10-CM

## 2019-05-19 LAB — COMPREHENSIVE METABOLIC PANEL
ALT: 23 U/L (ref 0–44)
AST: 21 U/L (ref 15–41)
Albumin: 3.8 g/dL (ref 3.5–5.0)
Alkaline Phosphatase: 133 U/L — ABNORMAL HIGH (ref 38–126)
Anion gap: 7 (ref 5–15)
BUN: 11 mg/dL (ref 8–23)
CO2: 24 mmol/L (ref 22–32)
Calcium: 8.7 mg/dL — ABNORMAL LOW (ref 8.9–10.3)
Chloride: 105 mmol/L (ref 98–111)
Creatinine, Ser: 1.03 mg/dL (ref 0.61–1.24)
GFR calc Af Amer: >60 mL/min (ref >60)
GFR calc non Af Amer: >60 mL/min (ref >60)
Glucose, Bld: 130 mg/dL — ABNORMAL HIGH (ref 70–99)
Potassium: 4.1 mmol/L (ref 3.5–5.1)
Sodium: 136 mmol/L (ref 135–145)
Total Bilirubin: 0.7 mg/dL (ref 0.3–1.2)
Total Protein: 6.5 g/dL (ref 6.5–8.1)

## 2019-05-19 LAB — CBC WITH DIFFERENTIAL/PLATELET
Abs Immature Granulocytes: 0.04 10*3/uL (ref 0.00–0.07)
Basophils Absolute: 0.1 10*3/uL (ref 0.0–0.1)
Basophils Relative: 1 %
Eosinophils Absolute: 0.1 10*3/uL (ref 0.0–0.5)
Eosinophils Relative: 2 %
HCT: 42.6 % (ref 39.0–52.0)
Hemoglobin: 14.1 g/dL (ref 13.0–17.0)
Immature Granulocytes: 1 %
Lymphocytes Relative: 18 %
Lymphs Abs: 1 10*3/uL (ref 0.7–4.0)
MCH: 31.2 pg (ref 26.0–34.0)
MCHC: 33.1 g/dL (ref 30.0–36.0)
MCV: 94.2 fL (ref 80.0–100.0)
Monocytes Absolute: 0.7 10*3/uL (ref 0.1–1.0)
Monocytes Relative: 12 %
Neutro Abs: 3.7 10*3/uL (ref 1.7–7.7)
Neutrophils Relative %: 66 %
Platelets: 155 10*3/uL (ref 150–400)
RBC: 4.52 MIL/uL (ref 4.22–5.81)
RDW: 13.5 % (ref 11.5–15.5)
WBC: 5.5 10*3/uL (ref 4.0–10.5)
nRBC: 0 % (ref 0.0–0.2)

## 2019-05-19 MED ORDER — SODIUM CHLORIDE 0.9% FLUSH
10.0000 mL | INTRAVENOUS | Status: DC | PRN
  Administered 2019-05-19: 09:00:00 10 mL via INTRAVENOUS
  Filled 2019-05-19: qty 10

## 2019-05-19 MED ORDER — HEPARIN SOD (PORK) LOCK FLUSH 100 UNIT/ML IV SOLN
500.0000 [IU] | Freq: Once | INTRAVENOUS | Status: AC
  Administered 2019-05-19: 500 [IU] via INTRAVENOUS
  Filled 2019-05-19: qty 5

## 2019-05-19 MED ORDER — HEPARIN SOD (PORK) LOCK FLUSH 100 UNIT/ML IV SOLN
INTRAVENOUS | Status: AC
  Filled 2019-05-19: qty 5

## 2019-05-19 MED ORDER — HEPARIN SOD (PORK) LOCK FLUSH 100 UNIT/ML IV SOLN
500.0000 [IU] | Freq: Once | INTRAVENOUS | Status: DC | PRN
  Filled 2019-05-19: qty 5

## 2019-05-19 MED ORDER — SODIUM CHLORIDE 0.9 % IV SOLN
10.0000 mg/kg | Freq: Once | INTRAVENOUS | Status: DC
  Filled 2019-05-19: qty 14

## 2019-05-19 MED ORDER — SODIUM CHLORIDE 0.9 % IV SOLN
740.0000 mg | Freq: Once | INTRAVENOUS | Status: AC
  Administered 2019-05-19: 11:00:00 740 mg via INTRAVENOUS
  Filled 2019-05-19: qty 10

## 2019-05-19 MED ORDER — SODIUM CHLORIDE 0.9 % IV SOLN
Freq: Once | INTRAVENOUS | Status: AC
  Filled 2019-05-19: qty 250

## 2019-05-19 NOTE — Progress Notes (Signed)
Hematology/Oncology follow up note [facility] [phone] [phone]   Patient Care Team: Baxter Hire, MD as PCP - General (Internal Medicine) Sherren Mocha, MD as PCP - Cardiology (Cardiology) Telford Nab, RN as Registered Nurse  REFERRING PROVIDER: Dr.Fleming REASON FOR VISIT:  Follow-up for management of stage III non-small cell lung cancer   HISTORY OF PRESENTING ILLNESS:  Timothy Yoder is a  64 y.o.  male with PMH listed below who was referred to me for evaluation of lung mass.  Patient is a former smoker quit smoking 7 years ago.  40-pack-year. Patient chest lung cancer screening on 03/11/2018. CT scan showed 2.4 cm right lower lobe nodule with a new necrotic.  Subcarinal lymph node. PET scan showed hypermetabolic right lower pulmonary nodule, consistent with primary bronchogenic carcinoma.  Fluid density subcarinal structure demonstrate no significant hypermetabolism.  Although this could represent an incidental benign lesion such as a cyst given the interval development since 02/24/2017, necrotic adenopathy cannot be excluded and tissue sampling should be considered.  Patient was seen by Dr. Faith Rogue for initial evaluation on 03/13/2018.  Patient has not got PET scan at that time. Today patient was accompanied by his wife to the oncology clinic to discuss management plan.  And PET scan results. Patient reports chronic shortness of breath with moderate exertion.  Appetite is good denies any fever, chills, hemoptysis, weight loss.  # underwent biopsy via bronchoscopy.  Subcarinal lymph node biopsy positive for non-small cell lung cancer, favoring poorly differentiated adenocarcinoma. #Mediport was placed by Dr. Genevive Bi  # #Elevated alkaline phosphatase. Patient has been evaluated by gastroenterology.  Liver biopsy showed mild sinusoidal dilation and congestion.  Minimal changes suggestive of hepatic process. Iron stain demonstrate  minimal focal hepatocellular iron. I discussed with Dr.Wohl via secure chat about patient's pathology result.  No restriction of proceeding with immunotherapy.  Continue watch for the labs.   # Cancer Treatment 04/26/2018 -06/11/2018 Concurrent Chemotherapy [weekly Carbo and Taxol] with RT.  07/10/2018 Started on maintenance Durvalumab.  INTERVAL HISTORY Timothy Yoder is a 64 y.o. male who has above history reviewed by me today presents for follow up visit for management of stage III non-small cell lung cancer.   Patient has been on maintenance immunotherapy. He tolerates well.  Intermittent fatigue, today he feels energy level has been at his baseline recently He denies any fever, chills, nausea, vomiting, diarrhea. He feels slightly more shortness of breath with exertion.  Has to use inhalers often.  Review of Systems  Constitutional: Positive for fatigue. Negative for appetite change, chills, fever and unexpected weight change.  HENT:   Negative for hearing loss and voice change.   Eyes: Negative for eye problems and icterus.  Respiratory: Negative for chest tightness, cough and shortness of breath.   Cardiovascular: Negative for chest pain and leg swelling.  Gastrointestinal: Negative for abdominal distention and abdominal pain.  Endocrine: Negative for hot flashes.  Genitourinary: Negative for difficulty urinating, dysuria and frequency.   Musculoskeletal: Negative for arthralgias.  Skin: Negative for itching and rash.  Neurological: Negative for light-headedness and numbness.  Hematological: Negative for adenopathy. Does not bruise/bleed easily.  Psychiatric/Behavioral: Negative for confusion.   MEDICAL HISTORY:  Past Medical History:  Diagnosis Date  . BPH (benign prostatic hyperplasia)   . CAD (coronary artery disease)    a. 04/2014 anterolateral STEMI/PCI: LM nl, LAD 77m(2.75x16 Promus DES, D1 50p, LCX nl, RI nl, OM1 100 small/chronic, RCA dominant, 20-329m7020m(FFR 0.90->Med  Rx), RPDA 50, RPLA 70, EF 60%.  Marland Kitchen COPD (chronic obstructive pulmonary disease) (HCC)    Dr Raul Del  . Former tobacco use   . Gastroesophageal reflux disease   . HTN (hypertension)   . Hyperlipidemia   . Hypokalemia   . Malignant neoplasm of lung ([place]) 04/01/2018  . Myocardial infarction ([place]) 04/2014   Dr Sherren Mocha  . Stented coronary artery     SURGICAL HISTORY: Past Surgical History:  Procedure Laterality Date  . CARDIAC CATHETERIZATION    . CHOLECYSTECTOMY N/A 02/15/2016   Procedure: LAPAROSCOPIC CHOLECYSTECTOMY WITH INTRAOPERATIVE CHOLANGIOGRAM;  Surgeon: Leonie Green, MD;  Location: [facility] ORS;  Service: General;  Laterality: N/A;  . COLONOSCOPY    . COLONOSCOPY WITH PROPOFOL N/A 08/02/2015   Procedure: COLONOSCOPY WITH PROPOFOL;  Surgeon: Manya Silvas, MD;  Location: [facility] ENDOSCOPY;  Service: Endoscopy;  Laterality: N/A;  . CORONARY ANGIOPLASTY  04/2014   STENT  . ENDOBRONCHIAL ULTRASOUND N/A 03/26/2018   Procedure: ENDOBRONCHIAL ULTRASOUND;  Surgeon: Flora Lipps, MD;  Location: [facility] ORS;  Service: Cardiopulmonary;  Laterality: N/A;  . ESOPHAGOGASTRODUODENOSCOPY (EGD) WITH PROPOFOL N/A 08/02/2015   Procedure: ESOPHAGOGASTRODUODENOSCOPY (EGD) WITH PROPOFOL;  Surgeon: Manya Silvas, MD;  Location: [facility] ENDOSCOPY;  Service: Endoscopy;  Laterality: N/A;  . FLEXIBLE BRONCHOSCOPY N/A 03/26/2018   Procedure: FLEXIBLE BRONCHOSCOPY;  Surgeon: Flora Lipps, MD;  Location: [facility] ORS;  Service: Cardiopulmonary;  Laterality: N/A;  . KNEE ARTHROSCOPY Left   . LEFT HEART CATHETERIZATION WITH CORONARY ANGIOGRAM N/A 05/03/2014   Procedure: LEFT HEART CATHETERIZATION WITH CORONARY ANGIOGRAM;  Surgeon: Blane Ohara, MD;  Location: [facility] CATH LAB;  Service: Cardiovascular;  Laterality: N/A;  . PERCUTANEOUS CORONARY STENT INTERVENTION (PCI-S) N/A 05/05/2014   Procedure: PERCUTANEOUS CORONARY STENT INTERVENTION (PCI-S);  Surgeon: Peter M Martinique, MD;  Location: [facility] CATH LAB;  Service:  Cardiovascular;  Laterality: N/A;  . PORTACATH PLACEMENT Left 04/04/2018   Procedure: INSERTION PORT-A-CATH;  Surgeon: [name], MD;  Location: [facility] ORS;  Service: General;  Laterality: Left;    SOCIAL HISTORY: Social History   Socioeconomic History  . Marital status: Married    Spouse name: Not on file  . Number of children: Not on file  . Years of education: Not on file  . Highest education level: Not on file  Occupational History  . Not on file  Tobacco Use  . Smoking status: Former Smoker    Packs/day: 1.00    Years: 40.00    Pack years: 40.00    Types: Cigarettes    Quit date: 02/14/2011    Years since quitting: 8.2  . Smokeless tobacco: Never Used  Substance and Sexual Activity  . Alcohol use: No  . Drug use: No  . Sexual activity: Not on file  Other Topics Concern  . Not on file  Social History Narrative   The patient is married. He lives in [place]. He works full-time. He quit smoking in 2013.   Social Determinants of Health   Financial Resource Strain:   . Difficulty of Paying Living Expenses: Not on file  Food Insecurity:   . Worried About Charity fundraiser in the Last Year: Not on file  . Ran Out of Food in the Last Year: Not on file  Transportation Needs:   . Lack of Transportation (Medical): Not on file  . Lack of Transportation (Non-Medical): Not on file  Physical Activity:   . Days of Exercise per Week: Not on file  . Minutes of Exercise per  Session: Not on file  Stress:   . Feeling of Stress : Not on file  Social Connections:   . Frequency of Communication with Friends and Family: Not on file  . Frequency of Social Gatherings with Friends and Family: Not on file  . Attends Religious Services: Not on file  . Active Member of Clubs or Organizations: Not on file  . Attends Archivist Meetings: Not on file  . Marital Status: Not on file  Intimate Partner Violence:   . Fear of Current or Ex-Partner: Not on file  . Emotionally  Abused: Not on file  . Physically Abused: Not on file  . Sexually Abused: Not on file    FAMILY HISTORY: Family History  Problem Relation Age of Onset  . CAD Mother 64       Died of an MI  . CAD Father 51       Died of a massive MI  . COPD Sister   . Lung cancer Brother   . Bone cancer Paternal Uncle   . Colon cancer Neg Hx   . Breast cancer Neg Hx     ALLERGIES:  has No Known Allergies.  MEDICATIONS:  Current Outpatient Medications  Medication Sig Dispense Refill  . acetaminophen (TYLENOL) 500 MG tablet Take 1,000 mg by mouth every 6 (six) hours as needed (for pain.).    Marland Kitchen albuterol (PROAIR HFA) 108 (90 Base) MCG/ACT inhaler Inhale 2 puffs into the lungs every 6 (six) hours as needed for wheezing or shortness of breath.     [name] ELLIPTA 62.5-25 MCG/INH AEPB INHALE 1 PUFF INTO THE LUNGS DAILY 180 each 0  . aspirin EC 81 MG tablet Take 81 mg by mouth daily.    Marland Kitchen atorvastatin (LIPITOR) 80 MG tablet Take 1 tablet (80 mg total) by mouth daily at 6 PM. 90 tablet 3  . carvedilol (COREG) 6.25 MG tablet Take 1 tablet (6.25 mg total) by mouth 2 (two) times daily. 180 tablet 3  . finasteride (PROSCAR) 5 MG tablet Take by mouth.    . folic acid (FOLVITE) 1 MG tablet Take 1 tablet (1 mg total) by mouth daily. 90 tablet 1  . lidocaine-prilocaine (EMLA) cream Apply small amount to port site 1 hour prior to [facility] being accessed and cover with plastic wrap 30 g 1  . omeprazole (PRILOSEC) 40 MG capsule Take 1 capsule (40 mg total) by mouth 2 (two) times daily. 180 capsule 1  . ondansetron (ZOFRAN) 8 MG tablet PLEASE SEE ATTACHED FOR DETAILED DIRECTIONS    . oxyCODONE-acetaminophen (PERCOCET/ROXICET) 5-325 MG tablet Take by mouth.    . potassium chloride SA (KLOR-CON M20) 20 MEQ tablet Take 1 tablet (20 mEq total) by mouth daily. 90 tablet 3  . sucralfate (CARAFATE) 1 g tablet Take 1 tablet (1 g total) by mouth 3 (three) times daily before meals. Dissolve in warm water, swish and swallow 90  tablet 6  . tamsulosin (FLOMAX) 0.4 MG CAPS capsule Take 0.4 mg by mouth daily after supper.    . traZODone (DESYREL) 50 MG tablet Take 1 tablet (50 mg total) by mouth at bedtime. 90 tablet 0  . vitamin B-12 (CYANOCOBALAMIN) 1000 MCG tablet Take 1 tablet (1,000 mcg total) by mouth daily. 90 tablet 1  . nitroGLYCERIN (NITROSTAT) 0.4 MG SL tablet Place 1 tablet (0.4 mg total) under the tongue every 5 (five) minutes as needed for chest pain. X 3 doses (Patient not taking: Reported on 05/19/2019) 25 tablet 3  No current facility-administered medications for this visit.     PHYSICAL EXAMINATION: ECOG PERFORMANCE STATUS: 0 - Asymptomatic Vitals:   05/19/19 0915  BP: 122/81  Pulse: 65  Resp: 16  Temp: 97.6 F (36.4 C)  SpO2: 97%   Filed Weights   05/19/19 0915  Weight: 161 lb 3.2 oz (73.1 kg)    Physical Exam Constitutional:      General: He is not in acute distress. HENT:     Head: Normocephalic and atraumatic.  Eyes:     General: No scleral icterus.    Pupils: Pupils are equal, round, and reactive to light.  Cardiovascular:     Rate and Rhythm: Normal rate and regular rhythm.     Heart sounds: Normal heart sounds.  Pulmonary:     Effort: Pulmonary effort is normal. No respiratory distress.     Breath sounds: No wheezing.  Abdominal:     General: Bowel sounds are normal. There is no distension.     Palpations: Abdomen is soft. There is no mass.     Tenderness: There is no abdominal tenderness.  Musculoskeletal:        General: No deformity. Normal range of motion.     Cervical back: Normal range of motion and neck supple.  Skin:    General: Skin is warm and dry.     Findings: No erythema or rash.  Neurological:     Mental Status: He is alert and oriented to person, place, and time.     Cranial Nerves: No cranial nerve deficit.     Coordination: Coordination normal.  Psychiatric:        Behavior: Behavior normal.        Thought Content: Thought content normal.       LABORATORY DATA:  I have reviewed the data as listed Lab Results  Component Value Date   WBC 5.5 05/19/2019   HGB 14.1 05/19/2019   HCT 42.6 05/19/2019   MCV 94.2 05/19/2019   PLT 155 05/19/2019   Recent Labs    04/07/19 0832 05/05/19 0909 05/19/19 0839  NA 140 138 136  K 3.7 3.6 4.1  CL 106 105 105  CO2 _0 GLUCOSE 106* 129* 130*  BUN _1 CREATININE 1.01 1.01 1.03  CALCIUM 8.8* 8.9 8.7*  GFRNONAA >60 >60 >60  GFRAA >60 >60 >60  PROT 6.6 6.9 6.5  ALBUMIN 3.7 3.8 3.8  AST _2 ALT _3 ALKPHOS 126 132* 133*  BILITOT 0.6 0.7 0.7   Iron/TIBC/Ferritin/ %Sat    Component Value Date/Time   IRON 41 (L) 11/21/2018 0856   TIBC 260 11/21/2018 0856   FERRITIN 150 11/21/2018 0856   IRONPCTSAT 16 (L) 11/21/2018 0856    RADIOGRAPHIC STUDIES: I have personally reviewed the radiological images as listed and agreed with the findings in the report.  CT Chest Wo Contrast  Result Date: 04/23/2019 CLINICAL DATA:  Stage IIIA right lower lobe lung adenocarcinoma status post concurrent chemotherapy with RT and maintenance immunotherapy. Restaging. EXAM: CT CHEST WITHOUT CONTRAST TECHNIQUE: Multidetector CT imaging of the chest was performed following the standard protocol without IV contrast. COMPARISON:  01/20/2019 chest CT. FINDINGS: Cardiovascular: Normal heart size. No significant pericardial effusion/thickening. Three-vessel coronary atherosclerosis. Left subclavian Port-A-Cath terminates in lower third of the SVC. Atherosclerotic nonaneurysmal thoracic aorta. Normal caliber pulmonary arteries. Mediastinum/Nodes: No discrete thyroid nodules. Unremarkable esophagus. No pathologically enlarged axillary, mediastinal or hilar lymph nodes, noting limited sensitivity for  the detection of hilar adenopathy on this noncontrast study. Lungs/Pleura: No pneumothorax. No pleural effusion. Moderate centrilobular emphysema. Masslike fibrosis in the medial right lower lobe  measures 3.5 x 1.7 cm (series 3/image 122), previously 3.6 x 2.1 cm, slightly decreased, with decreasing surrounding faint patchy ground-glass opacities. No acute consolidative airspace disease or new significant pulmonary nodules. Scattered calcified subcentimeter granulomas in both lungs are unchanged. Upper abdomen: Small hiatal hernia.  Cholecystectomy. Musculoskeletal:  No aggressive appearing focal osseous lesions. IMPRESSION: 1. Continued expected evolution of postradiation changes in the medial right lower lobe, with no evidence of local tumor recurrence. 2. No findings of metastatic disease in the chest. Aortic Atherosclerosis (ICD10-I70.0) and Emphysema (ICD10-J43.9). Electronically Signed   By: Ilona Sorrel M.D.   On: 04/23/2019 09:12     ASSESSMENT & PLAN:  1. Malignant neoplasm of lower lobe of right lung ([place])   2. Encounter for antineoplastic immunotherapy   3. Elevated alkaline phosphatase level   4. Chronic obstructive pulmonary disease, unspecified COPD type ([place])   Cancer Staging Malignant neoplasm of lower lobe of right lung [facility]) Staging form: Lung, AJCC 8th Edition - Clinical stage from 05/13/2018: Stage IIIA (cT1c, cN2, cM0) - Signed by Earlie Server, MD on 05/13/2018  #Stage IIIA lung adenocarcinoma Labs are reviewed and discussed with patient. Patient is doing well clinically. Labs are reviewed and discussed with patient. Counts acceptable proceed with durvalumab treatment today.  #Elevated alkaline phosphatase, level at 133.  Continue to monitor.  #COPD, continue follow-up with pulmonology.  Continue Anoro . Follow-up in 2 weeks evaluation prior to next cycle of durvalumab treatments.   Earlie Server, MD, PhD 05/19/19

## 2019-06-02 ENCOUNTER — Other Ambulatory Visit: Payer: Self-pay

## 2019-06-02 ENCOUNTER — Inpatient Hospital Stay (HOSPITAL_BASED_OUTPATIENT_CLINIC_OR_DEPARTMENT_OTHER): Payer: Medicare Other | Admitting: Oncology

## 2019-06-02 ENCOUNTER — Inpatient Hospital Stay: Payer: Medicare Other | Attending: Oncology

## 2019-06-02 ENCOUNTER — Inpatient Hospital Stay: Payer: Medicare Other

## 2019-06-02 ENCOUNTER — Encounter: Payer: Self-pay | Admitting: Oncology

## 2019-06-02 VITALS — BP 133/84 | HR 69 | Temp 97.3°F | Resp 16 | Wt 163.2 lb

## 2019-06-02 DIAGNOSIS — R748 Abnormal levels of other serum enzymes: Secondary | ICD-10-CM

## 2019-06-02 DIAGNOSIS — Z79899 Other long term (current) drug therapy: Secondary | ICD-10-CM | POA: Diagnosis not present

## 2019-06-02 DIAGNOSIS — Z5112 Encounter for antineoplastic immunotherapy: Secondary | ICD-10-CM | POA: Diagnosis not present

## 2019-06-02 DIAGNOSIS — C3431 Malignant neoplasm of lower lobe, right bronchus or lung: Secondary | ICD-10-CM

## 2019-06-02 DIAGNOSIS — Z95828 Presence of other vascular implants and grafts: Secondary | ICD-10-CM

## 2019-06-02 DIAGNOSIS — J449 Chronic obstructive pulmonary disease, unspecified: Secondary | ICD-10-CM | POA: Insufficient documentation

## 2019-06-02 LAB — COMPREHENSIVE METABOLIC PANEL
ALT: 20 U/L (ref 0–44)
AST: 21 U/L (ref 15–41)
Albumin: 3.8 g/dL (ref 3.5–5.0)
Alkaline Phosphatase: 117 U/L (ref 38–126)
Anion gap: 6 (ref 5–15)
BUN: 12 mg/dL (ref 8–23)
CO2: 24 mmol/L (ref 22–32)
Calcium: 8.4 mg/dL — ABNORMAL LOW (ref 8.9–10.3)
Chloride: 105 mmol/L (ref 98–111)
Creatinine, Ser: 1 mg/dL (ref 0.61–1.24)
GFR calc Af Amer: >60 mL/min (ref >60)
GFR calc non Af Amer: >60 mL/min (ref >60)
Glucose, Bld: 120 mg/dL — ABNORMAL HIGH (ref 70–99)
Potassium: 3.8 mmol/L (ref 3.5–5.1)
Sodium: 135 mmol/L (ref 135–145)
Total Bilirubin: 0.8 mg/dL (ref 0.3–1.2)
Total Protein: 6.6 g/dL (ref 6.5–8.1)

## 2019-06-02 LAB — CBC WITH DIFFERENTIAL/PLATELET
Abs Immature Granulocytes: 0.03 10*3/uL (ref 0.00–0.07)
Basophils Absolute: 0 10*3/uL (ref 0.0–0.1)
Basophils Relative: 1 %
Eosinophils Absolute: 0.1 10*3/uL (ref 0.0–0.5)
Eosinophils Relative: 2 %
HCT: 41.6 % (ref 39.0–52.0)
Hemoglobin: 13.5 g/dL (ref 13.0–17.0)
Immature Granulocytes: 1 %
Lymphocytes Relative: 17 %
Lymphs Abs: 0.9 10*3/uL (ref 0.7–4.0)
MCH: 30.8 pg (ref 26.0–34.0)
MCHC: 32.5 g/dL (ref 30.0–36.0)
MCV: 95 fL (ref 80.0–100.0)
Monocytes Absolute: 0.5 10*3/uL (ref 0.1–1.0)
Monocytes Relative: 10 %
Neutro Abs: 3.9 10*3/uL (ref 1.7–7.7)
Neutrophils Relative %: 69 %
Platelets: 159 10*3/uL (ref 150–400)
RBC: 4.38 MIL/uL (ref 4.22–5.81)
RDW: 13.3 % (ref 11.5–15.5)
WBC: 5.5 10*3/uL (ref 4.0–10.5)
nRBC: 0 % (ref 0.0–0.2)

## 2019-06-02 MED ORDER — SODIUM CHLORIDE 0.9% FLUSH
10.0000 mL | Freq: Once | INTRAVENOUS | Status: AC
  Administered 2019-06-02: 09:00:00 10 mL via INTRAVENOUS
  Filled 2019-06-02: qty 10

## 2019-06-02 MED ORDER — SODIUM CHLORIDE 0.9 % IV SOLN
700.0000 mg | Freq: Once | INTRAVENOUS | Status: AC
  Administered 2019-06-02: 700 mg via INTRAVENOUS
  Filled 2019-06-02: qty 10

## 2019-06-02 MED ORDER — SODIUM CHLORIDE 0.9 % IV SOLN
Freq: Once | INTRAVENOUS | Status: AC
  Filled 2019-06-02: qty 250

## 2019-06-02 MED ORDER — HEPARIN SOD (PORK) LOCK FLUSH 100 UNIT/ML IV SOLN
500.0000 [IU] | Freq: Once | INTRAVENOUS | Status: AC | PRN
  Administered 2019-06-02: 500 [IU]
  Filled 2019-06-02: qty 5

## 2019-06-02 MED ORDER — HEPARIN SOD (PORK) LOCK FLUSH 100 UNIT/ML IV SOLN
INTRAVENOUS | Status: AC
  Filled 2019-06-02: qty 5

## 2019-06-02 NOTE — Progress Notes (Signed)
Patient does not offer any problems today.  

## 2019-06-02 NOTE — Progress Notes (Signed)
Hematology/Oncology follow up note [facility] [phone] [phone]   Patient Care Team: Baxter Hire, MD as PCP - General (Internal Medicine) Sherren Mocha, MD as PCP - Cardiology (Cardiology) Telford Nab, RN as Registered Nurse  REFERRING PROVIDER: Dr.Fleming REASON FOR VISIT:  Follow-up for management of stage III non-small cell lung cancer   HISTORY OF PRESENTING ILLNESS:  Timothy Yoder is a  64 y.o.  male with PMH listed below who was referred to me for evaluation of lung mass.  Patient is a former smoker quit smoking 7 years ago.  40-pack-year. Patient chest lung cancer screening on 03/11/2018. CT scan showed 2.4 cm right lower lobe nodule with a new necrotic.  Subcarinal lymph node. PET scan showed hypermetabolic right lower pulmonary nodule, consistent with primary bronchogenic carcinoma.  Fluid density subcarinal structure demonstrate no significant hypermetabolism.  Although this could represent an incidental benign lesion such as a cyst given the interval development since 02/24/2017, necrotic adenopathy cannot be excluded and tissue sampling should be considered.  Patient was seen by Dr. Faith Rogue for initial evaluation on 03/13/2018.  Patient has not got PET scan at that time. Today patient was accompanied by his wife to the oncology clinic to discuss management plan.  And PET scan results. Patient reports chronic shortness of breath with moderate exertion.  Appetite is good denies any fever, chills, hemoptysis, weight loss.  # underwent biopsy via bronchoscopy.  Subcarinal lymph node biopsy positive for non-small cell lung cancer, favoring poorly differentiated adenocarcinoma. #Mediport was placed by Dr. Genevive Bi  # #Elevated alkaline phosphatase. Patient has been evaluated by gastroenterology.  Liver biopsy showed mild sinusoidal dilation and congestion.  Minimal changes suggestive of hepatic process. Iron stain demonstrate  minimal focal hepatocellular iron. I discussed with Dr.Wohl via secure chat about patient's pathology result.  No restriction of proceeding with immunotherapy.  Continue watch for the labs.   # Cancer Treatment 04/26/2018 -06/11/2018 Concurrent Chemotherapy [weekly Carbo and Taxol] with RT.  07/10/2018 Started on maintenance Durvalumab.  INTERVAL HISTORY Timothy Yoder is a 64 y.o. male who has above history reviewed by me today presents for follow up visit for management of stage III non-small cell lung cancer.   Patient has been on maintenance immunotherapy. He tolerates well. Chronic intermittent fatigue, stable.  Chronic SOB with exertion.   Review of Systems  Constitutional: Positive for fatigue. Negative for appetite change, chills, fever and unexpected weight change.  HENT:   Negative for hearing loss and voice change.   Eyes: Negative for eye problems and icterus.  Respiratory: Negative for chest tightness, cough and shortness of breath.   Cardiovascular: Negative for chest pain and leg swelling.  Gastrointestinal: Negative for abdominal distention and abdominal pain.  Endocrine: Negative for hot flashes.  Genitourinary: Negative for difficulty urinating, dysuria and frequency.   Musculoskeletal: Negative for arthralgias.  Skin: Negative for itching and rash.  Neurological: Negative for light-headedness and numbness.  Hematological: Negative for adenopathy. Does not bruise/bleed easily.  Psychiatric/Behavioral: Negative for confusion.   MEDICAL HISTORY:  Past Medical History:  Diagnosis Date  . BPH (benign prostatic hyperplasia)   . CAD (coronary artery disease)    a. 04/2014 anterolateral STEMI/PCI: LM nl, LAD 20m(2.75x16 Promus DES, D1 50p, LCX nl, RI nl, OM1 100 small/chronic, RCA dominant, 20-359m7044m(FFR 0.90->Med Rx), RPDA 50, RPLA 70, EF 60%.  . CMarland KitchenPD (chronic obstructive pulmonary disease) (HCC)    Dr FleRaul Del Former tobacco use   .  Gastroesophageal reflux disease    . HTN (hypertension)   . Hyperlipidemia   . Hypokalemia   . Malignant neoplasm of lung ([place]) 04/01/2018  . Myocardial infarction ([place]) 04/2014   Dr Sherren Mocha  . Stented coronary artery     SURGICAL HISTORY: Past Surgical History:  Procedure Laterality Date  . CARDIAC CATHETERIZATION    . CHOLECYSTECTOMY N/A 02/15/2016   Procedure: LAPAROSCOPIC CHOLECYSTECTOMY WITH INTRAOPERATIVE CHOLANGIOGRAM;  Surgeon: Leonie Green, MD;  Location: [facility] ORS;  Service: General;  Laterality: N/A;  . COLONOSCOPY    . COLONOSCOPY WITH PROPOFOL N/A 08/02/2015   Procedure: COLONOSCOPY WITH PROPOFOL;  Surgeon: Manya Silvas, MD;  Location: [facility] ENDOSCOPY;  Service: Endoscopy;  Laterality: N/A;  . CORONARY ANGIOPLASTY  04/2014   STENT  . ENDOBRONCHIAL ULTRASOUND N/A 03/26/2018   Procedure: ENDOBRONCHIAL ULTRASOUND;  Surgeon: Flora Lipps, MD;  Location: [facility] ORS;  Service: Cardiopulmonary;  Laterality: N/A;  . ESOPHAGOGASTRODUODENOSCOPY (EGD) WITH PROPOFOL N/A 08/02/2015   Procedure: ESOPHAGOGASTRODUODENOSCOPY (EGD) WITH PROPOFOL;  Surgeon: Manya Silvas, MD;  Location: [facility] ENDOSCOPY;  Service: Endoscopy;  Laterality: N/A;  . FLEXIBLE BRONCHOSCOPY N/A 03/26/2018   Procedure: FLEXIBLE BRONCHOSCOPY;  Surgeon: Flora Lipps, MD;  Location: [facility] ORS;  Service: Cardiopulmonary;  Laterality: N/A;  . KNEE ARTHROSCOPY Left   . LEFT HEART CATHETERIZATION WITH CORONARY ANGIOGRAM N/A 05/03/2014   Procedure: LEFT HEART CATHETERIZATION WITH CORONARY ANGIOGRAM;  Surgeon: Blane Ohara, MD;  Location: [facility] CATH LAB;  Service: Cardiovascular;  Laterality: N/A;  . PERCUTANEOUS CORONARY STENT INTERVENTION (PCI-S) N/A 05/05/2014   Procedure: PERCUTANEOUS CORONARY STENT INTERVENTION (PCI-S);  Surgeon: Peter M Martinique, MD;  Location[facility] CATH LAB;  Service: Cardiovascular;  Laterality: N/A;  . PORTACATH PLACEMENT Left 04/04/2018   Procedure: INSERTION PORT-A-CATH;  Surgeon: [name], MD;  Location: [facility] ORS;   Service: General;  Laterality: Left;    SOCIAL HISTORY: Social History   Socioeconomic History  . Marital status: Married    Spouse name: Not on file  . Number of children: Not on file  . Years of education: Not on file  . Highest education level: Not on file  Occupational History  . Not on file  Tobacco Use  . Smoking status: Former Smoker    Packs/day: 1.00    Years: 40.00    Pack years: 40.00    Types: Cigarettes    Quit date: 02/14/2011    Years since quitting: 8.3  . Smokeless tobacco: Never Used  Substance and Sexual Activity  . Alcohol use: No  . Drug use: No  . Sexual activity: Not on file  Other Topics Concern  . Not on file  Social History Narrative   The patient is married. He lives in [place]. He works full-time. He quit smoking in 2013.   Social Determinants of Health   Financial Resource Strain:   . Difficulty of Paying Living Expenses: Not on file  Food Insecurity:   . Worried About Charity fundraiser in the Last Year: Not on file  . Ran Out of Food in the Last Year: Not on file  Transportation Needs:   . Lack of Transportation (Medical): Not on file  . Lack of Transportation (Non-Medical): Not on file  Physical Activity:   . Days of Exercise per Week: Not on file  . Minutes of Exercise per Session: Not on file  Stress:   . Feeling of Stress : Not on file  Social Connections:   . Frequency of Communication with Friends  and Family: Not on file  . Frequency of Social Gatherings with Friends and Family: Not on file  . Attends Religious Services: Not on file  . Active Member of Clubs or Organizations: Not on file  . Attends Archivist Meetings: Not on file  . Marital Status: Not on file  Intimate Partner Violence:   . Fear of Current or Ex-Partner: Not on file  . Emotionally Abused: Not on file  . Physically Abused: Not on file  . Sexually Abused: Not on file    FAMILY HISTORY: Family History  Problem Relation Age of Onset  .  CAD Mother 9       Died of an MI  . CAD Father 51       Died of a massive MI  . COPD Sister   . Lung cancer Brother   . Bone cancer Paternal Uncle   . Colon cancer Neg Hx   . Breast cancer Neg Hx     ALLERGIES:  has No Known Allergies.  MEDICATIONS:  Current Outpatient Medications  Medication Sig Dispense Refill  . acetaminophen (TYLENOL) 500 MG tablet Take 1,000 mg by mouth every 6 (six) hours as needed (for pain.).    Marland Kitchen albuterol (PROAIR HFA) 108 (90 Base) MCG/ACT inhaler Inhale 2 puffs into the lungs every 6 (six) hours as needed for wheezing or shortness of breath.     [name] ELLIPTA 62.5-25 MCG/INH AEPB INHALE 1 PUFF INTO THE LUNGS DAILY 180 each 0  . aspirin EC 81 MG tablet Take 81 mg by mouth daily.    Marland Kitchen atorvastatin (LIPITOR) 80 MG tablet Take 1 tablet (80 mg total) by mouth daily at 6 PM. 90 tablet 3  . carvedilol (COREG) 6.25 MG tablet Take 1 tablet (6.25 mg total) by mouth 2 (two) times daily. 180 tablet 3  . finasteride (PROSCAR) 5 MG tablet Take by mouth.    . folic acid (FOLVITE) 1 MG tablet Take 1 tablet (1 mg total) by mouth daily. 90 tablet 1  . lidocaine-prilocaine (EMLA) cream Apply small amount to port site 1 hour prior to [facility] being accessed and cover with plastic wrap 30 g 1  . omeprazole (PRILOSEC) 40 MG capsule Take 1 capsule (40 mg total) by mouth 2 (two) times daily. 180 capsule 1  . oxyCODONE-acetaminophen (PERCOCET/ROXICET) 5-325 MG tablet Take by mouth.    . potassium chloride SA (KLOR-CON M20) 20 MEQ tablet Take 1 tablet (20 mEq total) by mouth daily. 90 tablet 3  . sucralfate (CARAFATE) 1 g tablet Take 1 tablet (1 g total) by mouth 3 (three) times daily before meals. Dissolve in warm water, swish and swallow 90 tablet 6  . tamsulosin (FLOMAX) 0.4 MG CAPS capsule Take 0.4 mg by mouth daily after supper.    . traZODone (DESYREL) 50 MG tablet Take 1 tablet (50 mg total) by mouth at bedtime. 90 tablet 0  . vitamin B-12 (CYANOCOBALAMIN) 1000 MCG tablet Take  1 tablet (1,000 mcg total) by mouth daily. 90 tablet 1  . nitroGLYCERIN (NITROSTAT) 0.4 MG SL tablet Place 1 tablet (0.4 mg total) under the tongue every 5 (five) minutes as needed for chest pain. X 3 doses (Patient not taking: Reported on 05/19/2019) 25 tablet 3  . ondansetron (ZOFRAN) 8 MG tablet PLEASE SEE ATTACHED FOR DETAILED DIRECTIONS     No current facility-administered medications for this visit.     PHYSICAL EXAMINATION: ECOG PERFORMANCE STATUS: 0 - Asymptomatic Vitals:   06/02/19 8676  BP: 133/84  Pulse: 69  Resp: 16  Temp: (!) 97.3 F (36.3 C)   Filed Weights   06/02/19 0906  Weight: 163 lb 3.2 oz (74 kg)    Physical Exam Constitutional:      General: He is not in acute distress. HENT:     Head: Normocephalic and atraumatic.  Eyes:     General: No scleral icterus.    Pupils: Pupils are equal, round, and reactive to light.  Cardiovascular:     Rate and Rhythm: Normal rate and regular rhythm.     Heart sounds: Normal heart sounds.  Pulmonary:     Effort: Pulmonary effort is normal. No respiratory distress.     Breath sounds: No wheezing.     Comments: Decreased breath sounds bilaterally.  Abdominal:     General: Bowel sounds are normal. There is no distension.     Palpations: Abdomen is soft. There is no mass.     Tenderness: There is no abdominal tenderness.  Musculoskeletal:        General: No deformity. Normal range of motion.     Cervical back: Normal range of motion and neck supple.  Skin:    General: Skin is warm and dry.     Findings: No erythema or rash.  Neurological:     Mental Status: He is alert and oriented to person, place, and time. Mental status is at baseline.     Cranial Nerves: No cranial nerve deficit.     Coordination: Coordination normal.  Psychiatric:        Mood and Affect: Mood normal.        Behavior: Behavior normal.        Thought Content: Thought content normal.      LABORATORY DATA:  I have reviewed the data as  listed Lab Results  Component Value Date   WBC 5.5 06/02/2019   HGB 13.5 06/02/2019   HCT 41.6 06/02/2019   MCV 95.0 06/02/2019   PLT 159 06/02/2019   Recent Labs    05/05/19 0909 05/19/19 0839 06/02/19 0843  NA 138 136 135  K 3.6 4.1 3.8  CL 105 105 105  CO2 _0 GLUCOSE 129* 130* 120*  BUN _1 CREATININE 1.01 1.03 1.00  CALCIUM 8.9 8.7* 8.4*  GFRNONAA >60 >60 >60  GFRAA >60 >60 >60  PROT 6.9 6.5 6.6  ALBUMIN 3.8 3.8 3.8  AST _2 ALT _3 ALKPHOS 132* 133* 117  BILITOT 0.7 0.7 0.8   Iron/TIBC/Ferritin/ %Sat    Component Value Date/Time   IRON 41 (L) 11/21/2018 0856   TIBC 260 11/21/2018 0856   FERRITIN 150 11/21/2018 0856   IRONPCTSAT 16 (L) 11/21/2018 0856    RADIOGRAPHIC STUDIES: I have personally reviewed the radiological images as listed and agreed with the findings in the report.  CT Chest Wo Contrast  Result Date: 04/23/2019 CLINICAL DATA:  Stage IIIA right lower lobe lung adenocarcinoma status post concurrent chemotherapy with RT and maintenance immunotherapy. Restaging. EXAM: CT CHEST WITHOUT CONTRAST TECHNIQUE: Multidetector CT imaging of the chest was performed following the standard protocol without IV contrast. COMPARISON:  01/20/2019 chest CT. FINDINGS: Cardiovascular: Normal heart size. No significant pericardial effusion/thickening. Three-vessel coronary atherosclerosis. Left subclavian Port-A-Cath terminates in lower third of the SVC. Atherosclerotic nonaneurysmal thoracic aorta. Normal caliber pulmonary arteries. Mediastinum/Nodes: No discrete thyroid nodules. Unremarkable esophagus. No pathologically enlarged axillary, mediastinal or hilar lymph nodes, noting limited sensitivity for the  detection of hilar adenopathy on this noncontrast study. Lungs/Pleura: No pneumothorax. No pleural effusion. Moderate centrilobular emphysema. Masslike fibrosis in the medial right lower lobe measures 3.5 x 1.7 cm (series 3/image 122), previously 3.6  x 2.1 cm, slightly decreased, with decreasing surrounding faint patchy ground-glass opacities. No acute consolidative airspace disease or new significant pulmonary nodules. Scattered calcified subcentimeter granulomas in both lungs are unchanged. Upper abdomen: Small hiatal hernia.  Cholecystectomy. Musculoskeletal:  No aggressive appearing focal osseous lesions. IMPRESSION: 1. Continued expected evolution of postradiation changes in the medial right lower lobe, with no evidence of local tumor recurrence. 2. No findings of metastatic disease in the chest. Aortic Atherosclerosis (ICD10-I70.0) and Emphysema (ICD10-J43.9). Electronically Signed   By: Ilona Sorrel M.D.   On: 04/23/2019 09:12     ASSESSMENT & PLAN:  1. Malignant neoplasm of lower lobe of right lung ([place])   2. Encounter for antineoplastic immunotherapy   3. Elevated alkaline phosphatase level   4. Chronic obstructive pulmonary disease, unspecified COPD type ([place])   Cancer Staging Malignant neoplasm of lower lobe of right lung [facility]) Staging form: Lung, AJCC 8th Edition - Clinical stage from 05/13/2018: Stage IIIA (cT1c, cN2, cM0) - Signed by Earlie Server, MD on 05/13/2018  #Stage IIIA lung adenocarcinoma Labs are reviewed and discussed with patient. Proceed with Durvalumab treatment today.   # Elevated ALP,  Level normalized today.   #COPD, continue follow-up with pulmonology.  Continue Anoro . Follow-up in 2 weeks evaluation prior to next cycle of durvalumab treatments.   Earlie Server, MD, PhD 06/02/19

## 2019-06-05 ENCOUNTER — Ambulatory Visit: Payer: Medicare Other | Attending: Internal Medicine

## 2019-06-05 DIAGNOSIS — Z23 Encounter for immunization: Secondary | ICD-10-CM | POA: Insufficient documentation

## 2019-06-05 NOTE — Progress Notes (Signed)
   Covid-19 Vaccination Clinic  Name:  LORETTA KLUENDER    MRN: [id number] DOB: July 11, 1955  06/05/2019  Mr. Soave was observed post Covid-19 immunization for 15 minutes without incidence. He was provided with Vaccine Information Sheet and instruction to access the V-Safe system.   Mr. Antosh was instructed to call 911 with any severe reactions post vaccine: Marland Kitchen Difficulty breathing  . Swelling of your face and throat  . A fast heartbeat  . A bad rash all over your body  . Dizziness and weakness    Immunizations Administered    Name Date Dose VIS Date Route   Pfizer COVID-19 Vaccine 06/05/2019  8:37 AM 0.3 mL 04/04/2019 Intramuscular   Manufacturer: [place]   Lot: [id number]   [place]: [id number]

## 2019-06-11 MED ORDER — ATORVASTATIN CALCIUM 80 MG PO TABS: 80.0000 mg | ORAL_TABLET | Freq: Every day | ORAL | 0 refills | Status: DC

## 2019-06-11 NOTE — Addendum Note (Signed)
Addended by: Derl Barrow on: 06/11/2019 10:56 AM   Modules accepted: Orders

## 2019-06-13 ENCOUNTER — Other Ambulatory Visit: Payer: Self-pay

## 2019-06-16 ENCOUNTER — Encounter: Payer: Self-pay | Admitting: Oncology

## 2019-06-16 ENCOUNTER — Ambulatory Visit: Payer: BC Managed Care – PPO | Admitting: Radiation Oncology

## 2019-06-16 ENCOUNTER — Inpatient Hospital Stay: Payer: Medicare Other

## 2019-06-16 ENCOUNTER — Encounter: Payer: Self-pay | Admitting: Radiation Oncology

## 2019-06-16 ENCOUNTER — Other Ambulatory Visit: Payer: Self-pay

## 2019-06-16 ENCOUNTER — Ambulatory Visit: Payer: Medicare Other | Admitting: Radiation Oncology

## 2019-06-16 ENCOUNTER — Inpatient Hospital Stay (HOSPITAL_BASED_OUTPATIENT_CLINIC_OR_DEPARTMENT_OTHER): Payer: Medicare Other | Admitting: Oncology

## 2019-06-16 ENCOUNTER — Ambulatory Visit
Admission: RE | Admit: 2019-06-16 | Discharge: 2019-06-16 | Disposition: A | Discharge status: Home / Self Care | Payer: Medicare Other | Source: Ambulatory Visit | Attending: Radiation Oncology | Admitting: Radiation Oncology

## 2019-06-16 VITALS — BP 126/78 | HR 64 | Temp 95.8°F | Resp 16 | Wt 165.1 lb

## 2019-06-16 DIAGNOSIS — Z95828 Presence of other vascular implants and grafts: Secondary | ICD-10-CM

## 2019-06-16 DIAGNOSIS — C3431 Malignant neoplasm of lower lobe, right bronchus or lung: Secondary | ICD-10-CM | POA: Diagnosis present

## 2019-06-16 DIAGNOSIS — Z5112 Encounter for antineoplastic immunotherapy: Secondary | ICD-10-CM | POA: Diagnosis not present

## 2019-06-16 DIAGNOSIS — Z923 Personal history of irradiation: Secondary | ICD-10-CM | POA: Insufficient documentation

## 2019-06-16 DIAGNOSIS — R748 Abnormal levels of other serum enzymes: Secondary | ICD-10-CM

## 2019-06-16 DIAGNOSIS — J449 Chronic obstructive pulmonary disease, unspecified: Secondary | ICD-10-CM | POA: Diagnosis not present

## 2019-06-16 DIAGNOSIS — Z9221 Personal history of antineoplastic chemotherapy: Secondary | ICD-10-CM | POA: Insufficient documentation

## 2019-06-16 DIAGNOSIS — R5383 Other fatigue: Secondary | ICD-10-CM

## 2019-06-16 LAB — COMPREHENSIVE METABOLIC PANEL
ALT: 20 U/L (ref 0–44)
AST: 20 U/L (ref 15–41)
Albumin: 3.8 g/dL (ref 3.5–5.0)
Alkaline Phosphatase: 120 U/L (ref 38–126)
Anion gap: 6 (ref 5–15)
BUN: 13 mg/dL (ref 8–23)
CO2: 26 mmol/L (ref 22–32)
Calcium: 8.6 mg/dL — ABNORMAL LOW (ref 8.9–10.3)
Chloride: 105 mmol/L (ref 98–111)
Creatinine, Ser: 1.03 mg/dL (ref 0.61–1.24)
GFR calc Af Amer: >60 mL/min (ref >60)
GFR calc non Af Amer: >60 mL/min (ref >60)
Glucose, Bld: 116 mg/dL — ABNORMAL HIGH (ref 70–99)
Potassium: 3.9 mmol/L (ref 3.5–5.1)
Sodium: 137 mmol/L (ref 135–145)
Total Bilirubin: 0.7 mg/dL (ref 0.3–1.2)
Total Protein: 6.8 g/dL (ref 6.5–8.1)

## 2019-06-16 LAB — CBC WITH DIFFERENTIAL/PLATELET
Abs Immature Granulocytes: 0.04 10*3/uL (ref 0.00–0.07)
Basophils Absolute: 0 10*3/uL (ref 0.0–0.1)
Basophils Relative: 1 %
Eosinophils Absolute: 0.1 10*3/uL (ref 0.0–0.5)
Eosinophils Relative: 2 %
HCT: 41.9 % (ref 39.0–52.0)
Hemoglobin: 13.9 g/dL (ref 13.0–17.0)
Immature Granulocytes: 1 %
Lymphocytes Relative: 19 %
Lymphs Abs: 1.1 10*3/uL (ref 0.7–4.0)
MCH: 31.4 pg (ref 26.0–34.0)
MCHC: 33.2 g/dL (ref 30.0–36.0)
MCV: 94.8 fL (ref 80.0–100.0)
Monocytes Absolute: 0.6 10*3/uL (ref 0.1–1.0)
Monocytes Relative: 10 %
Neutro Abs: 4.2 10*3/uL (ref 1.7–7.7)
Neutrophils Relative %: 67 %
Platelets: 166 10*3/uL (ref 150–400)
RBC: 4.42 MIL/uL (ref 4.22–5.81)
RDW: 13.2 % (ref 11.5–15.5)
WBC: 6.1 10*3/uL (ref 4.0–10.5)
nRBC: 0 % (ref 0.0–0.2)

## 2019-06-16 LAB — TSH: TSH: 1.263 u[IU]/mL (ref 0.350–4.500)

## 2019-06-16 MED ORDER — SODIUM CHLORIDE 0.9% FLUSH
10.0000 mL | Freq: Once | INTRAVENOUS | Status: AC
  Administered 2019-06-16: 09:00:00 10 mL via INTRAVENOUS
  Filled 2019-06-16: qty 10

## 2019-06-16 MED ORDER — SODIUM CHLORIDE 0.9 % IV SOLN
700.0000 mg | Freq: Once | INTRAVENOUS | Status: AC
  Administered 2019-06-16: 700 mg via INTRAVENOUS
  Filled 2019-06-16: qty 10

## 2019-06-16 MED ORDER — HEPARIN SOD (PORK) LOCK FLUSH 100 UNIT/ML IV SOLN
500.0000 [IU] | Freq: Once | INTRAVENOUS | Status: AC | PRN
  Administered 2019-06-16: 500 [IU]
  Filled 2019-06-16: qty 5

## 2019-06-16 MED ORDER — SODIUM CHLORIDE 0.9 % IV SOLN
Freq: Once | INTRAVENOUS | Status: AC
  Filled 2019-06-16: qty 250

## 2019-06-16 MED ORDER — HEPARIN SOD (PORK) LOCK FLUSH 100 UNIT/ML IV SOLN
INTRAVENOUS | Status: AC
  Filled 2019-06-16: qty 5

## 2019-06-16 NOTE — Progress Notes (Signed)
Patient does not offer any problems today.  

## 2019-06-16 NOTE — Progress Notes (Signed)
Radiation Oncology Follow up Note  Name: Timothy Yoder   Date:   06/16/2019 MRN:  [id number] DOB: [birth year]    This 64 y.o. male presents to the clinic today for 14-month follow-up status post concurrent chemoradiation therapy for stage IIIa non-small cell lung cancer of the right lower lobe.Marland Kitchen  REFERRING PROVIDER: Baxter Hire, MD  HPI: Patient is a 64 year old male now at 11 months having completed concurrent chemo radiation for stage IIIa (T2 N2 M0) with non-small cell lung cancer of the right lower lobe.  Seen today in routine follow-up he is doing well.  He specifically denies cough hemoptysis or chest tightness..  CT scan back in December which I reviewed shows continued evolution of post radiation changes with no evidence of local tumor progression or recurrence.  He is currently on Durvalumab which she is tolerating well.  According to patient that may discontinue in March.  COMPLICATIONS OF TREATMENT: none  FOLLOW UP COMPLIANCE: keeps appointments   PHYSICAL EXAM:  BP 126/78   Pulse 64   Temp (!) 95.8 F (35.4 C)   Resp 16   Wt 165 lb 1.6 oz (74.9 kg)   BMI 25.10 kg/m  Well-developed well-nourished patient in NAD. HEENT reveals PERLA, EOMI, discs not visualized.  Oral cavity is clear. No oral mucosal lesions are identified. Neck is clear without evidence of cervical or supraclavicular adenopathy. Lungs are clear to A&P. Cardiac examination is essentially unremarkable with regular rate and rhythm without murmur rub or thrill. Abdomen is benign with no organomegaly or masses noted. Motor sensory and DTR levels are equal and symmetric in the upper and lower extremities. Cranial nerves II through XII are grossly intact. Proprioception is intact. No peripheral adenopathy or edema is identified. No motor or sensory levels are noted. Crude visual fields are within normal range.  RADIOLOGY RESULTS: CT scans reviewed compatible with above-stated findings  PLAN: Present time patient  is doing well no evidence of tumor progression by CT criteria.  He continues on immunotherapy.  I have asked to see the patient back in 6 months for follow-up and then will start once a year follow-up visits.  He continues close follow-up care with medical oncology.  Patient knows to call with any concerns.  I would like to take this opportunity to thank you for allowing me to participate in the care of your patient.Noreene Filbert, MD

## 2019-06-16 NOTE — Progress Notes (Signed)
Hematology/Oncology follow up note [facility] [phone] [phone]   Patient Care Team: Baxter Hire, MD as PCP - General (Internal Medicine) Sherren Mocha, MD as PCP - Cardiology (Cardiology) Telford Nab, RN as Registered Nurse  REFERRING PROVIDER: Dr.Fleming REASON FOR VISIT:  Follow-up for management of stage III non-small cell lung cancer   HISTORY OF PRESENTING ILLNESS:  Timothy Yoder is a  64 y.o.  male with PMH listed below who was referred to me for evaluation of lung mass.  Patient is a former smoker quit smoking 7 years ago.  40-pack-year. Patient chest lung cancer screening on 03/11/2018. CT scan showed 2.4 cm right lower lobe nodule with a new necrotic.  Subcarinal lymph node. PET scan showed hypermetabolic right lower pulmonary nodule, consistent with primary bronchogenic carcinoma.  Fluid density subcarinal structure demonstrate no significant hypermetabolism.  Although this could represent an incidental benign lesion such as a cyst given the interval development since 02/24/2017, necrotic adenopathy cannot be excluded and tissue sampling should be considered.  Patient was seen by Dr. Faith Rogue for initial evaluation on 03/13/2018.  Patient has not got PET scan at that time. Today patient was accompanied by his wife to the oncology clinic to discuss management plan.  And PET scan results. Patient reports chronic shortness of breath with moderate exertion.  Appetite is good denies any fever, chills, hemoptysis, weight loss.  # underwent biopsy via bronchoscopy.  Subcarinal lymph node biopsy positive for non-small cell lung cancer, favoring poorly differentiated adenocarcinoma. #Mediport was placed by Dr. Genevive Bi  # #Elevated alkaline phosphatase. Patient has been evaluated by gastroenterology.  Liver biopsy showed mild sinusoidal dilation and congestion.  Minimal changes suggestive of hepatic process. Iron stain demonstrate  minimal focal hepatocellular iron. I discussed with Dr.Wohl via secure chat about patient's pathology result.  No restriction of proceeding with immunotherapy.  Continue watch for the labs.   # Cancer Treatment 04/26/2018 -06/11/2018 Concurrent Chemotherapy [weekly Carbo and Taxol] with RT.  07/10/2018 Started on maintenance Durvalumab.  INTERVAL HISTORY Timothy Yoder is a 64 y.o. male who has above history reviewed by me today presents for follow up visit for management of stage III non-small cell lung cancer.   Patient has been on maintenance immunotherapy. He reports doing well.  No new complaints. He continues to have chronic intermittent fatigue which is stable.  Chronic shortness of breath with exertion. He made a follow-up appointment with Dr. Mortimer Fries Review of Systems  Constitutional: Positive for fatigue. Negative for appetite change, chills, fever and unexpected weight change.  HENT:   Negative for hearing loss and voice change.   Eyes: Negative for eye problems and icterus.  Respiratory: Positive for shortness of breath. Negative for chest tightness and cough.   Cardiovascular: Negative for chest pain and leg swelling.  Gastrointestinal: Negative for abdominal distention and abdominal pain.  Endocrine: Negative for hot flashes.  Genitourinary: Negative for difficulty urinating, dysuria and frequency.   Musculoskeletal: Negative for arthralgias.  Skin: Negative for itching and rash.  Neurological: Negative for light-headedness and numbness.  Hematological: Negative for adenopathy. Does not bruise/bleed easily.  Psychiatric/Behavioral: Negative for confusion.   MEDICAL HISTORY:  Past Medical History:  Diagnosis Date  . BPH (benign prostatic hyperplasia)   . CAD (coronary artery disease)    a. 04/2014 anterolateral STEMI/PCI: LM nl, LAD 59m(2.75x16 Promus DES, D1 50p, LCX nl, RI nl, OM1 100 small/chronic, RCA dominant, [id number](FFR 0.90->Med Rx), RPDA 50, RPLA 70, EF  60%.  Marland Kitchen  COPD (chronic obstructive pulmonary disease) (HCC)    Dr Raul Del  . Former tobacco use   . Gastroesophageal reflux disease   . HTN (hypertension)   . Hyperlipidemia   . Hypokalemia   . Malignant neoplasm of lung ([place]) 04/01/2018  . Myocardial infarction ([place]) 04/2014   Dr Sherren Mocha  . Stented coronary artery     SURGICAL HISTORY: Past Surgical History:  Procedure Laterality Date  . CARDIAC CATHETERIZATION    . CHOLECYSTECTOMY N/A 02/15/2016   Procedure: LAPAROSCOPIC CHOLECYSTECTOMY WITH INTRAOPERATIVE CHOLANGIOGRAM;  Surgeon: Leonie Green, MD;  Location: [facility] ORS;  Service: General;  Laterality: N/A;  . COLONOSCOPY    . COLONOSCOPY WITH PROPOFOL N/A 08/02/2015   Procedure: COLONOSCOPY WITH PROPOFOL;  Surgeon: Manya Silvas, MD;  [facility] ENDOSCOPY;  Service: Endoscopy;  Laterality: N/A;  . CORONARY ANGIOPLASTY  04/2014   STENT  . ENDOBRONCHIAL ULTRASOUND N/A 03/26/2018   Procedure: ENDOBRONCHIAL ULTRASOUND;  Surgeon: Flora Lipps, MD;  Location: [facility] ORS;  Service: Cardiopulmonary;  Laterality: N/A;  . ESOPHAGOGASTRODUODENOSCOPY (EGD) WITH PROPOFOL N/A 08/02/2015   Procedure: ESOPHAGOGASTRODUODENOSCOPY (EGD) WITH PROPOFOL;  Surgeon: Manya Silvas, MD;  Location: [facility] ENDOSCOPY;  Service: Endoscopy;  Laterality: N/A;  . FLEXIBLE BRONCHOSCOPY N/A 03/26/2018   Procedure: FLEXIBLE BRONCHOSCOPY;  Surgeon: Flora Lipps, MD;  Location: [facility] ORS;  Service: Cardiopulmonary;  Laterality: N/A;  . KNEE ARTHROSCOPY Left   . LEFT HEART CATHETERIZATION WITH CORONARY ANGIOGRAM N/A 05/03/2014   Procedure: LEFT HEART CATHETERIZATION WITH CORONARY ANGIOGRAM;  Surgeon: Blane Ohara, MD;  Location: [facility] CATH LAB;  Service: Cardiovascular;  Laterality: N/A;  . PERCUTANEOUS CORONARY STENT INTERVENTION (PCI-S) N/A 05/05/2014   Procedure: PERCUTANEOUS CORONARY STENT INTERVENTION (PCI-S);  Surgeon: Peter M Martinique, MD;  Location: [facility] CATH LAB;  Service: Cardiovascular;  Laterality: N/A;  .  PORTACATH PLACEMENT Left 04/04/2018   Procedure: INSERTION PORT-A-CATH;  Surgeon: [name], MD;  Location: [facility] ORS;  Service: General;  Laterality: Left;    SOCIAL HISTORY: Social History   Socioeconomic History  . Marital status: Married    Spouse name: Not on file  . Number of children: Not on file  . Years of education: Not on file  . Highest education level: Not on file  Occupational History  . Not on file  Tobacco Use  . Smoking status: Former Smoker    Packs/day: 1.00    Years: 40.00    Pack years: 40.00    Types: Cigarettes    Quit date: 02/14/2011    Years since quitting: 8.3  . Smokeless tobacco: Never Used  Substance and Sexual Activity  . Alcohol use: No  . Drug use: No  . Sexual activity: Not on file  Other Topics Concern  . Not on file  Social History Narrative   The patient is married. He lives in [place]. He works full-time. He quit smoking in 2013.   Social Determinants of Health   Financial Resource Strain:   . Difficulty of Paying Living Expenses: Not on file  Food Insecurity:   . Worried About Charity fundraiser in the Last Year: Not on file  . Ran Out of Food in the Last Year: Not on file  Transportation Needs:   . Lack of Transportation (Medical): Not on file  . Lack of Transportation (Non-Medical): Not on file  Physical Activity:   . Days of Exercise per Week: Not on file  . Minutes of Exercise per Session: Not on file  Stress:   . Feeling of Stress : Not on file  Social Connections:   . Frequency of Communication with Friends and Family: Not on file  . Frequency of Social Gatherings with Friends and Family: Not on file  . Attends Religious Services: Not on file  . Active Member of Clubs or Organizations: Not on file  . Attends Archivist Meetings: Not on file  . Marital Status: Not on file  Intimate Partner Violence:   . Fear of Current or Ex-Partner: Not on file  . Emotionally Abused: Not on file  . Physically  Abused: Not on file  . Sexually Abused: Not on file    FAMILY HISTORY: Family History  Problem Relation Age of Onset  . CAD Mother 23       Died of an MI  . CAD Father 16       Died of a massive MI  . COPD Sister   . Lung cancer Brother   . Bone cancer Paternal Uncle   . Colon cancer Neg Hx   . Breast cancer Neg Hx     ALLERGIES:  has No Known Allergies.  MEDICATIONS:  Current Outpatient Medications  Medication Sig Dispense Refill  . acetaminophen (TYLENOL) 500 MG tablet Take 1,000 mg by mouth every 6 (six) hours as needed (for pain.).    Marland Kitchen albuterol (PROAIR HFA) 108 (90 Base) MCG/ACT inhaler Inhale 2 puffs into the lungs every 6 (six) hours as needed for wheezing or shortness of breath.     [name] ELLIPTA 62.5-25 MCG/INH AEPB INHALE 1 PUFF INTO THE LUNGS DAILY 180 each 0  . aspirin EC 81 MG tablet Take 81 mg by mouth daily.    Marland Kitchen atorvastatin (LIPITOR) 80 MG tablet Take 1 tablet (80 mg total) by mouth daily at 6 PM. Please make yearly appt with Dr. Burt Knack for May for future refills. 1st attempt 90 tablet 0  . carvedilol (COREG) 6.25 MG tablet Take 1 tablet (6.25 mg total) by mouth 2 (two) times daily. 180 tablet 3  . finasteride (PROSCAR) 5 MG tablet Take by mouth.    . folic acid (FOLVITE) 1 MG tablet Take 1 tablet (1 mg total) by mouth daily. 90 tablet 1  . lidocaine-prilocaine (EMLA) cream Apply small amount to port site 1 hour prior to [facility] being accessed and cover with plastic wrap 30 g 1  . omeprazole (PRILOSEC) 40 MG capsule Take 1 capsule (40 mg total) by mouth 2 (two) times daily. 180 capsule 1  . ondansetron (ZOFRAN) 8 MG tablet PLEASE SEE ATTACHED FOR DETAILED DIRECTIONS    . oxyCODONE-acetaminophen (PERCOCET/ROXICET) 5-325 MG tablet Take by mouth.    . potassium chloride SA (KLOR-CON M20) 20 MEQ tablet Take 1 tablet (20 mEq total) by mouth daily. 90 tablet 3  . sucralfate (CARAFATE) 1 g tablet Take 1 tablet (1 g total) by mouth 3 (three) times daily before meals.  Dissolve in warm water, swish and swallow 90 tablet 6  . tamsulosin (FLOMAX) 0.4 MG CAPS capsule Take 0.4 mg by mouth daily after supper.    . traZODone (DESYREL) 50 MG tablet Take 1 tablet (50 mg total) by mouth at bedtime. 90 tablet 0  . vitamin B-12 (CYANOCOBALAMIN) 1000 MCG tablet Take 1 tablet (1,000 mcg total) by mouth daily. 90 tablet 1  . nitroGLYCERIN (NITROSTAT) 0.4 MG SL tablet Place 1 tablet (0.4 mg total) under the tongue every 5 (five) minutes as needed for chest pain. X 3 doses (  Patient not taking: Reported on 05/19/2019) 25 tablet 3   No current facility-administered medications for this visit.     PHYSICAL EXAMINATION: ECOG PERFORMANCE STATUS: 1 - Symptomatic but completely ambulatory Vitals:   06/16/19 0955  BP: 126/78  Pulse: 64  Resp: 16  Temp: (!) 95.8 F (35.4 C)   Filed Weights   06/16/19 0955  Weight: 165 lb 1.6 oz (74.9 kg)    Physical Exam Constitutional:      General: He is not in acute distress. HENT:     Head: Normocephalic and atraumatic.  Eyes:     General: No scleral icterus.    Pupils: Pupils are equal, round, and reactive to light.  Cardiovascular:     Rate and Rhythm: Normal rate and regular rhythm.     Heart sounds: Normal heart sounds.  Pulmonary:     Effort: Pulmonary effort is normal. No respiratory distress.     Breath sounds: No wheezing.     Comments: Decreased breath sounds bilaterally.  Abdominal:     General: Bowel sounds are normal. There is no distension.     Palpations: Abdomen is soft. There is no mass.     Tenderness: There is no abdominal tenderness.  Musculoskeletal:        General: No deformity. Normal range of motion.     Cervical back: Normal range of motion and neck supple.  Skin:    General: Skin is warm and dry.     Findings: No erythema or rash.  Neurological:     Mental Status: He is alert and oriented to person, place, and time. Mental status is at baseline.     Cranial Nerves: No cranial nerve deficit.      Coordination: Coordination normal.  Psychiatric:        Mood and Affect: Mood normal.        Behavior: Behavior normal.        Thought Content: Thought content normal.      LABORATORY DATA:  I have reviewed the data as listed Lab Results  Component Value Date   WBC 6.1 06/16/2019   HGB 13.9 06/16/2019   HCT 41.9 06/16/2019   MCV 94.8 06/16/2019   PLT 166 06/16/2019   Recent Labs    05/19/19 0839 06/02/19 0843 06/16/19 0923  NA 136 135 137  K 4.1 3.8 3.9  CL 105 105 105  CO2 _0 GLUCOSE 130* 120* 116*  BUN _1 CREATININE 1.03 1.00 1.03  CALCIUM 8.7* 8.4* 8.6*  GFRNONAA >60 >60 >60  GFRAA >60 >60 >60  PROT 6.5 6.6 6.8  ALBUMIN 3.8 3.8 3.8  AST _2 ALT _3 ALKPHOS 133* 117 120  BILITOT 0.7 0.8 0.7   Iron/TIBC/Ferritin/ %Sat    Component Value Date/Time   IRON 41 (L) 11/21/2018 0856   TIBC 260 11/21/2018 0856   FERRITIN 150 11/21/2018 0856   IRONPCTSAT 16 (L) 11/21/2018 0856    RADIOGRAPHIC STUDIES: I have personally reviewed the radiological images as listed and agreed with the findings in the report.  CT Chest Wo Contrast  Result Date: 04/23/2019 CLINICAL DATA:  Stage IIIA right lower lobe lung adenocarcinoma status post concurrent chemotherapy with RT and maintenance immunotherapy. Restaging. EXAM: CT CHEST WITHOUT CONTRAST TECHNIQUE: Multidetector CT imaging of the chest was performed following the standard protocol without IV contrast. COMPARISON:  01/20/2019 chest CT. FINDINGS: Cardiovascular: Normal heart size. No significant pericardial effusion/thickening. Three-vessel coronary atherosclerosis.  Left subclavian Port-A-Cath terminates in lower third of the SVC. Atherosclerotic nonaneurysmal thoracic aorta. Normal caliber pulmonary arteries. Mediastinum/Nodes: No discrete thyroid nodules. Unremarkable esophagus. No pathologically enlarged axillary, mediastinal or hilar lymph nodes, noting limited sensitivity for the detection of hilar  adenopathy on this noncontrast study. Lungs/Pleura: No pneumothorax. No pleural effusion. Moderate centrilobular emphysema. Masslike fibrosis in the medial right lower lobe measures 3.5 x 1.7 cm (series 3/image 122), previously 3.6 x 2.1 cm, slightly decreased, with decreasing surrounding faint patchy ground-glass opacities. No acute consolidative airspace disease or new significant pulmonary nodules. Scattered calcified subcentimeter granulomas in both lungs are unchanged. Upper abdomen: Small hiatal hernia.  Cholecystectomy. Musculoskeletal:  No aggressive appearing focal osseous lesions. IMPRESSION: 1. Continued expected evolution of postradiation changes in the medial right lower lobe, with no evidence of local tumor recurrence. 2. No findings of metastatic disease in the chest. Aortic Atherosclerosis (ICD10-I70.0) and Emphysema (ICD10-J43.9). Electronically Signed   By: Ilona Sorrel M.D.   On: 04/23/2019 09:12     ASSESSMENT & PLAN:  1. Malignant neoplasm of lower lobe of right lung ([place])   2. Encounter for antineoplastic immunotherapy   3. Chronic obstructive pulmonary disease, unspecified COPD type ([place])   Cancer Staging Malignant neoplasm of lower lobe of right lung [facility]) Staging form: Lung, AJCC 8th Edition - Clinical stage from 05/13/2018: Stage IIIA (cT1c, cN2, cM0) - Signed by Earlie Server, MD on 05/13/2018  #Stage IIIA lung adenocarcinoma Labs are reviewed and discussed with the patient. Counts are stable and acceptable to proceed with today's durvalumab treatments.  #Elevated alkaline phosphatase, etiology unknown.  Possibly due to immunotherapy.  Interval assessment normalized. #COPD, continue follow-up with pulmonology.  Continue Anoro.  Patient has a follow-up appointment coming up with Dr. Mortimer Fries . Follow-up in 2 weeks evaluation prior to next cycle of durvalumab treatments.   Earlie Server, MD, PhD 06/16/19

## 2019-06-24 ENCOUNTER — Ambulatory Visit: Payer: Medicare Other | Admitting: Physician Assistant

## 2019-06-24 ENCOUNTER — Ambulatory Visit: Payer: Medicare Other | Admitting: Adult Health

## 2019-06-30 ENCOUNTER — Inpatient Hospital Stay: Payer: Medicare Other

## 2019-06-30 ENCOUNTER — Inpatient Hospital Stay: Payer: Medicare Other | Attending: Oncology

## 2019-06-30 ENCOUNTER — Encounter: Payer: Self-pay | Admitting: Oncology

## 2019-06-30 ENCOUNTER — Inpatient Hospital Stay (HOSPITAL_BASED_OUTPATIENT_CLINIC_OR_DEPARTMENT_OTHER): Payer: Medicare Other | Admitting: Oncology

## 2019-06-30 ENCOUNTER — Other Ambulatory Visit: Payer: Self-pay

## 2019-06-30 VITALS — BP 126/82 | HR 79 | Temp 96.5°F | Wt 163.8 lb

## 2019-06-30 DIAGNOSIS — C3431 Malignant neoplasm of lower lobe, right bronchus or lung: Secondary | ICD-10-CM

## 2019-06-30 DIAGNOSIS — E538 Deficiency of other specified B group vitamins: Secondary | ICD-10-CM | POA: Insufficient documentation

## 2019-06-30 DIAGNOSIS — R748 Abnormal levels of other serum enzymes: Secondary | ICD-10-CM | POA: Insufficient documentation

## 2019-06-30 DIAGNOSIS — Z95828 Presence of other vascular implants and grafts: Secondary | ICD-10-CM

## 2019-06-30 DIAGNOSIS — Z5112 Encounter for antineoplastic immunotherapy: Secondary | ICD-10-CM

## 2019-06-30 DIAGNOSIS — J449 Chronic obstructive pulmonary disease, unspecified: Secondary | ICD-10-CM | POA: Diagnosis not present

## 2019-06-30 DIAGNOSIS — R0602 Shortness of breath: Secondary | ICD-10-CM

## 2019-06-30 LAB — CBC WITH DIFFERENTIAL/PLATELET
Abs Immature Granulocytes: 0.07 10*3/uL (ref 0.00–0.07)
Basophils Absolute: 0.1 10*3/uL (ref 0.0–0.1)
Basophils Relative: 1 %
Eosinophils Absolute: 0.1 10*3/uL (ref 0.0–0.5)
Eosinophils Relative: 2 %
HCT: 43.9 % (ref 39.0–52.0)
Hemoglobin: 14.2 g/dL (ref 13.0–17.0)
Immature Granulocytes: 1 %
Lymphocytes Relative: 16 %
Lymphs Abs: 1.1 10*3/uL (ref 0.7–4.0)
MCH: 31.1 pg (ref 26.0–34.0)
MCHC: 32.3 g/dL (ref 30.0–36.0)
MCV: 96.1 fL (ref 80.0–100.0)
Monocytes Absolute: 0.7 10*3/uL (ref 0.1–1.0)
Monocytes Relative: 9 %
Neutro Abs: 5.2 10*3/uL (ref 1.7–7.7)
Neutrophils Relative %: 71 %
Platelets: 153 10*3/uL (ref 150–400)
RBC: 4.57 MIL/uL (ref 4.22–5.81)
RDW: 13.3 % (ref 11.5–15.5)
WBC: 7.3 10*3/uL (ref 4.0–10.5)
nRBC: 0 % (ref 0.0–0.2)

## 2019-06-30 LAB — COMPREHENSIVE METABOLIC PANEL
ALT: 21 U/L (ref 0–44)
AST: 23 U/L (ref 15–41)
Albumin: 3.7 g/dL (ref 3.5–5.0)
Alkaline Phosphatase: 117 U/L (ref 38–126)
Anion gap: 8 (ref 5–15)
BUN: 11 mg/dL (ref 8–23)
CO2: 24 mmol/L (ref 22–32)
Calcium: 8.2 mg/dL — ABNORMAL LOW (ref 8.9–10.3)
Chloride: 103 mmol/L (ref 98–111)
Creatinine, Ser: 1.06 mg/dL (ref 0.61–1.24)
GFR calc Af Amer: >60 mL/min (ref >60)
GFR calc non Af Amer: >60 mL/min (ref >60)
Glucose, Bld: 135 mg/dL — ABNORMAL HIGH (ref 70–99)
Potassium: 3.8 mmol/L (ref 3.5–5.1)
Sodium: 135 mmol/L (ref 135–145)
Total Bilirubin: 0.7 mg/dL (ref 0.3–1.2)
Total Protein: 6.5 g/dL (ref 6.5–8.1)

## 2019-06-30 MED ORDER — HEPARIN SOD (PORK) LOCK FLUSH 100 UNIT/ML IV SOLN
500.0000 [IU] | Freq: Once | INTRAVENOUS | Status: AC | PRN
  Administered 2019-06-30: 500 [IU]
  Filled 2019-06-30: qty 5

## 2019-06-30 MED ORDER — HEPARIN SOD (PORK) LOCK FLUSH 100 UNIT/ML IV SOLN
INTRAVENOUS | Status: AC
  Filled 2019-06-30: qty 5

## 2019-06-30 MED ORDER — SODIUM CHLORIDE 0.9 % IV SOLN
Freq: Once | INTRAVENOUS | Status: AC
  Filled 2019-06-30: qty 250

## 2019-06-30 MED ORDER — SODIUM CHLORIDE 0.9 % IV SOLN
700.0000 mg | Freq: Once | INTRAVENOUS | Status: AC
  Administered 2019-06-30: 10:00:00 700 mg via INTRAVENOUS
  Filled 2019-06-30: qty 10

## 2019-06-30 MED ORDER — SODIUM CHLORIDE 0.9% FLUSH
10.0000 mL | Freq: Once | INTRAVENOUS | Status: AC
  Administered 2019-06-30: 10 mL via INTRAVENOUS
  Filled 2019-06-30: qty 10

## 2019-06-30 NOTE — Progress Notes (Signed)
Patient has previously discussed increased SOBr.  He has scheduled f/u appts with his cardiologist and pulmonologist.

## 2019-06-30 NOTE — Progress Notes (Signed)
Hematology/Oncology follow up note Timothy Yoder [phone] [phone]   Patient Care Team: Baxter Hire, MD as PCP - General (Internal Medicine) Sherren Mocha, MD as PCP - Cardiology (Cardiology) Telford Nab, RN as Registered Nurse  REFERRING PROVIDER: Dr.Fleming REASON FOR VISIT:  Follow-up for management of stage III non-small cell lung cancer   HISTORY OF PRESENTING ILLNESS:  Timothy Yoder is a  64 y.o.  male with PMH listed below who was referred to me for evaluation of lung mass.  Patient is a former smoker quit smoking 7 years ago.  40-pack-year. Patient chest lung cancer screening on 03/11/2018. CT scan showed 2.4 cm right lower lobe nodule with a new necrotic.  Subcarinal lymph node. PET scan showed hypermetabolic right lower pulmonary nodule, consistent with primary bronchogenic carcinoma.  Fluid density subcarinal structure demonstrate no significant hypermetabolism.  Although this could represent an incidental benign lesion such as a cyst given the interval development since 02/24/2017, necrotic adenopathy cannot be excluded and tissue sampling should be considered.  # underwent biopsy via bronchoscopy.  Subcarinal lymph node biopsy positive for non-small cell lung cancer, favoring poorly differentiated adenocarcinoma.  Stage IIIA T1c N2M0 #Mediport was placed by Dr. Genevive Bi  # #Elevated alkaline phosphatase. Patient has been evaluated by gastroenterology.  Liver biopsy showed mild sinusoidal dilation and congestion.  Minimal changes suggestive of hepatic process. Iron stain demonstrate minimal focal hepatocellular iron. I discussed with Dr.Wohl via secure chat about patient's pathology result.  No restriction of proceeding with immunotherapy.  Continue watch for the labs.   # Cancer Treatment 04/26/2018 -06/11/2018 Concurrent Chemotherapy [weekly Carbo and Taxol] with RT.  07/10/2018 Started on maintenance Durvalumab.  INTERVAL  HISTORY Timothy Yoder is a 64 y.o. male who has above history reviewed by me today presents for follow up visit for management of stage III non-small cell lung cancer.   Patient has been on maintenance immunotherapy. Clinically he is doing well.  He denies any new complaints. Chronic intermittent fatigue.  Cortisol level and TSH level have been monitored. He has no new complaints. Chronic shortness of breath with exertion.  Review of Systems  Constitutional: Positive for fatigue. Negative for appetite change, chills, fever and unexpected weight change.  HENT:   Negative for hearing loss and voice change.   Eyes: Negative for eye problems and icterus.  Respiratory: Positive for shortness of breath. Negative for chest tightness and cough.   Cardiovascular: Negative for chest pain and leg swelling.  Gastrointestinal: Negative for abdominal distention and abdominal pain.  Endocrine: Negative for hot flashes.  Genitourinary: Negative for difficulty urinating, dysuria and frequency.   Musculoskeletal: Negative for arthralgias.  Skin: Negative for itching and rash.  Neurological: Negative for light-headedness and numbness.  Hematological: Negative for adenopathy. Does not bruise/bleed easily.  Psychiatric/Behavioral: Negative for confusion.   MEDICAL HISTORY:  Past Medical History:  Diagnosis Date  . BPH (benign prostatic hyperplasia)   . CAD (coronary artery disease)    a. 04/2014 anterolateral STEMI/PCI: LM nl, LAD 23m(2.75x16 Promus DES, D1 50p, LCX nl, RI nl, OM1 100 small/chronic, RCA dominant, [id number](FFR 0.90->Med Rx), RPDA 50, RPLA 70, EF 60%.  . CMarland KitchenPD (chronic obstructive pulmonary disease) (HCC)    Dr FleRaul Del Former tobacco use   . Gastroesophageal reflux disease   . HTN (hypertension)   . Hyperlipidemia   . Hypokalemia   . Malignant neoplasm of lung ([facility]2/12/2017  . Myocardial infarction ([facility]/2016  Dr Sherren Mocha  . Stented coronary artery      SURGICAL HISTORY: Past Surgical History:  Procedure Laterality Date  . CARDIAC CATHETERIZATION    . CHOLECYSTECTOMY N/A 02/15/2016   Procedure: LAPAROSCOPIC CHOLECYSTECTOMY WITH INTRAOPERATIVE CHOLANGIOGRAM;  Surgeon: Leonie Green, MD;  Location: [facility] ORS;  Service: General;  Laterality: N/A;  . COLONOSCOPY    . COLONOSCOPY WITH PROPOFOL N/A 08/02/2015   Procedure: COLONOSCOPY WITH PROPOFOL;  Surgeon: Manya Silvas, MD;  Location: [facility];  Service: Endoscopy;  Laterality: N/A;  . CORONARY ANGIOPLASTY  04/2014   STENT  . ENDOBRONCHIAL ULTRASOUND N/A 03/26/2018   Procedure: ENDOBRONCHIAL ULTRASOUND;  Surgeon: Flora Lipps, MD;  Location: [facility] ORS;  Service: Cardiopulmonary;  Laterality: N/A;  . ESOPHAGOGASTRODUODENOSCOPY (EGD) WITH PROPOFOL N/A 08/02/2015   Procedure: ESOPHAGOGASTRODUODENOSCOPY (EGD) WITH PROPOFOL;  Surgeon: Manya Silvas, MD;  Location[facility];  Service: Endoscopy;  Laterality: N/A;  . FLEXIBLE BRONCHOSCOPY N/A 03/26/2018   Procedure: FLEXIBLE BRONCHOSCOPY;  Surgeon: Flora Lipps, MD;  Location: [facility] ORS;  Service: Cardiopulmonary;  Laterality: N/A;  . KNEE ARTHROSCOPY Left   . LEFT HEART CATHETERIZATION WITH CORONARY ANGIOGRAM N/A 05/03/2014   Procedure: LEFT HEART CATHETERIZATION WITH CORONARY ANGIOGRAM;  Surgeon: Blane Ohara, MD;  Location: [facility] C[facility] LAB;  Service: Cardiovascular;  Laterality: N/A;  . PERCUTANEOUS CORONARY STENT INTERVENTION (PCI-S) N/A 05/05/2014   Procedure: PERCUTANEOUS CORONARY STENT INTERVENTION (PCI-S);  Surgeon: Peter M Martinique, MD;  Location: [facility] CATH LAB;  Service: Cardiovascular;  Laterality: N/A;  . PORTACATH PLACEMENT Left 04/04/2018   Procedure: INSERTION PORT-A-CATH;  Surgeon: [name], MD;  Location: [facility] ORS;  Service: General;  Laterality: Left;    SOCIAL HISTORY: Social History   Socioeconomic History  . Marital status: Married    Spouse name: Not on file  . Number of children: Not on file  .  Years of education: Not on file  . Highest education level: Not on file  Occupational History  . Not on file  Tobacco Use  . Smoking status: Former Smoker    Packs/day: 1.00    Years: 40.00    Pack years: 40.00    Types: Cigarettes    Quit date: 02/14/2011    Years since quitting: 8.3  . Smokeless tobacco: Never Used  Substance and Sexual Activity  . Alcohol use: No  . Drug use: No  . Sexual activity: Not on file  Other Topics Concern  . Not on file  Social History Narrative   The patient is married. He lives in [place]. He works full-time. He quit smoking in 2013.   Social Determinants of Health   Financial Resource Strain:   . Difficulty of Paying Living Expenses: Not on file  Food Insecurity:   . Worried About Charity fundraiser in the Last Year: Not on file  . Ran Out of Food in the Last Year: Not on file  Transportation Needs:   . Lack of Transportation (Medical): Not on file  . Lack of Transportation (Non-Medical): Not on file  Physical Activity:   . Days of Exercise per Week: Not on file  . Minutes of Exercise per Session: Not on file  Stress:   . Feeling of Stress : Not on file  Social Connections:   . Frequency of Communication with Friends and Family: Not on file  . Frequency of Social Gatherings with Friends and Family: Not on file  . Attends Religious Services: Not on file  . Active Member of Clubs or  Organizations: Not on file  . Attends Archivist Meetings: Not on file  . Marital Status: Not on file  Intimate Partner Violence:   . Fear of Current or Ex-Partner: Not on file  . Emotionally Abused: Not on file  . Physically Abused: Not on file  . Sexually Abused: Not on file    FAMILY HISTORY: Family History  Problem Relation Age of Onset  . CAD Mother 54       Died of an MI  . CAD Father 56       Died of a massive MI  . COPD Sister   . Lung cancer Brother   . Bone cancer Paternal Uncle   . Colon cancer Neg Hx   . Breast cancer  Neg Hx     ALLERGIES:  has No Known Allergies.  MEDICATIONS:  Current Outpatient Medications  Medication Sig Dispense Refill  . acetaminophen (TYLENOL) 500 MG tablet Take 1,000 mg by mouth every 6 (six) hours as needed (for pain.).    Marland Kitchen albuterol (PROAIR HFA) 108 (90 Base) MCG/ACT inhaler Inhale 2 puffs into the lungs every 6 (six) hours as needed for wheezing or shortness of breath.     [name] ELLIPTA 62.5-25 MCG/INH AEPB INHALE 1 PUFF INTO THE LUNGS DAILY 180 each 0  . aspirin EC 81 MG tablet Take 81 mg by mouth daily.    Marland Kitchen atorvastatin (LIPITOR) 80 MG tablet Take 1 tablet (80 mg total) by mouth daily at 6 PM. Please make yearly appt with Dr. Burt Knack for May for future refills. 1st attempt 90 tablet 0  . carvedilol (COREG) 6.25 MG tablet Take 1 tablet (6.25 mg total) by mouth 2 (two) times daily. 180 tablet 3  . finasteride (PROSCAR) 5 MG tablet Take by mouth.    . folic acid (FOLVITE) 1 MG tablet Take 1 tablet (1 mg total) by mouth daily. 90 tablet 1  . lidocaine-prilocaine (EMLA) cream Apply small amount to port site 1 hour prior to [facility] being accessed and cover with plastic wrap 30 g 1  . nitroGLYCERIN (NITROSTAT) 0.4 MG SL tablet Place 1 tablet (0.4 mg total) under the tongue every 5 (five) minutes as needed for chest pain. X 3 doses (Patient not taking: Reported on 05/19/2019) 25 tablet 3  . omeprazole (PRILOSEC) 40 MG capsule Take 1 capsule (40 mg total) by mouth 2 (two) times daily. 180 capsule 1  . ondansetron (ZOFRAN) 8 MG tablet PLEASE SEE ATTACHED FOR DETAILED DIRECTIONS    . oxyCODONE-acetaminophen (PERCOCET/ROXICET) 5-325 MG tablet Take by mouth.    . potassium chloride SA (KLOR-CON M20) 20 MEQ tablet Take 1 tablet (20 mEq total) by mouth daily. 90 tablet 3  . sucralfate (CARAFATE) 1 g tablet Take 1 tablet (1 g total) by mouth 3 (three) times daily before meals. Dissolve in warm water, swish and swallow 90 tablet 6  . tamsulosin (FLOMAX) 0.4 MG CAPS capsule Take 0.4 mg by mouth  daily after supper.    . traZODone (DESYREL) 50 MG tablet Take 1 tablet (50 mg total) by mouth at bedtime. 90 tablet 0  . vitamin B-12 (CYANOCOBALAMIN) 1000 MCG tablet Take 1 tablet (1,000 mcg total) by mouth daily. 90 tablet 1   No current facility-administered medications for this visit.     PHYSICAL EXAMINATION: ECOG PERFORMANCE STATUS: 1 - Symptomatic but completely ambulatory There were no vitals filed for this visit. There were no vitals filed for this visit.  Physical Exam Constitutional:  General: He is not in acute distress. HENT:     Head: Normocephalic and atraumatic.  Eyes:     General: No scleral icterus.    Pupils: Pupils are equal, round, and reactive to light.  Cardiovascular:     Rate and Rhythm: Normal rate and regular rhythm.     Heart sounds: Normal heart sounds.  Pulmonary:     Effort: Pulmonary effort is normal. No respiratory distress.     Breath sounds: No wheezing.     Comments: Decreased breath sounds bilaterally.  Abdominal:     General: Bowel sounds are normal. There is no distension.     Palpations: Abdomen is soft. There is no mass.     Tenderness: There is no abdominal tenderness.  Musculoskeletal:        General: No deformity. Normal range of motion.     Cervical back: Normal range of motion and neck supple.  Skin:    General: Skin is warm and dry.     Findings: No erythema or rash.  Neurological:     Mental Status: He is alert and oriented to person, place, and time. Mental status is at baseline.     Cranial Nerves: No cranial nerve deficit.     Coordination: Coordination normal.  Psychiatric:        Mood and Affect: Mood normal.        Behavior: Behavior normal.        Thought Content: Thought content normal.      LABORATORY DATA:  I have reviewed the data as listed Lab Results  Component Value Date   WBC 6.1 06/16/2019   HGB 13.9 06/16/2019   HCT 41.9 06/16/2019   MCV 94.8 06/16/2019   PLT 166 06/16/2019   Recent Labs     05/19/19 0839 06/02/19 0843 06/16/19 0923  NA 136 135 137  K 4.1 3.8 3.9  CL 105 105 105  CO2 _0 GLUCOSE 130* 120* 116*  BUN _1 CREATININE 1.03 1.00 1.03  CALCIUM 8.7* 8.4* 8.6*  GFRNONAA >60 >60 >60  GFRAA >60 >60 >60  PROT 6.5 6.6 6.8  ALBUMIN 3.8 3.8 3.8  AST _2 ALT _3 ALKPHOS 133* 117 120  BILITOT 0.7 0.8 0.7   Iron/TIBC/Ferritin/ %Sat    Component Value Date/Time   IRON 41 (L) 11/21/2018 0856   TIBC 260 11/21/2018 0856   FERRITIN 150 11/21/2018 0856   IRONPCTSAT 16 (L) 11/21/2018 0856    RADIOGRAPHIC STUDIES: I have personally reviewed the radiological images as listed and agreed with the findings in the report.  CT Chest Wo Contrast  Result Date: 04/23/2019 CLINICAL DATA:  Stage IIIA right lower lobe lung adenocarcinoma status post concurrent chemotherapy with RT and maintenance immunotherapy. Restaging. EXAM: CT CHEST WITHOUT CONTRAST TECHNIQUE: Multidetector CT imaging of the chest was performed following the standard protocol without IV contrast. COMPARISON:  01/20/2019 chest CT. FINDINGS: Cardiovascular: Normal heart size. No significant pericardial effusion/thickening. Three-vessel coronary atherosclerosis. Left subclavian Port-A-Cath terminates in lower third of the SVC. Atherosclerotic nonaneurysmal thoracic aorta. Normal caliber pulmonary arteries. Mediastinum/Nodes: No discrete thyroid nodules. Unremarkable esophagus. No pathologically enlarged axillary, mediastinal or hilar lymph nodes, noting limited sensitivity for the detection of hilar adenopathy on this noncontrast study. Lungs/Pleura: No pneumothorax. No pleural effusion. Moderate centrilobular emphysema. Masslike fibrosis in the medial right lower lobe measures 3.5 x 1.7 cm (series 3/image 122), previously 3.6 x 2.1 cm, slightly decreased, with decreasing  surrounding faint patchy ground-glass opacities. No acute consolidative airspace disease or new significant pulmonary  nodules. Scattered calcified subcentimeter granulomas in both lungs are unchanged. Upper abdomen: Small hiatal hernia.  Cholecystectomy. Musculoskeletal:  No aggressive appearing focal osseous lesions. IMPRESSION: 1. Continued expected evolution of postradiation changes in the medial right lower lobe, with no evidence of local tumor recurrence. 2. No findings of metastatic disease in the chest. Aortic Atherosclerosis (ICD10-I70.0) and Emphysema (ICD10-J43.9). Electronically Signed   By: Ilona Sorrel M.D.   On: 04/23/2019 09:12     ASSESSMENT & PLAN:  1. Malignant neoplasm of lower lobe of right lung ([place])   2. Encounter for antineoplastic immunotherapy   3. Chronic obstructive pulmonary disease, unspecified COPD type (HCC)   4. Elevated alkaline phosphatase level   5. SOB (shortness of breath) on exertion   Cancer Staging Malignant neoplasm of lower lobe of right lung [facility]) Staging form: Lung, AJCC 8th Edition - Clinical stage from 05/13/2018: Stage IIIA (cT1c, cN2, cM0) - Signed by Earlie Server, MD on 05/13/2018  #Stage IIIA lung adenocarcinoma Labs reviewed and discussed with patient. Acceptable to proceed with today's durvalumab treatments. I will obtain CT chest with contrast for evaluation of his shortness of breath and also surveillance scan.  #Elevated alkaline phosphatase, etiology unknown.  Possibly due to immunotherapy.  Alkaline phosphatase level has been stable.  #COPD, continue follow-up with pulmonology.  Continue Anoro.   Recommend patient to follow-up with pulmonologist and he also has an appointment with cardiologist Follow-up in 2 weeks evaluation prior to next cycle of durvalumab treatments.   Earlie Server, MD, PhD 06/30/19

## 2019-07-01 ENCOUNTER — Ambulatory Visit: Payer: Medicare Other | Attending: Internal Medicine

## 2019-07-01 DIAGNOSIS — Z23 Encounter for immunization: Secondary | ICD-10-CM

## 2019-07-01 NOTE — Progress Notes (Signed)
   Covid-19 Vaccination Clinic  Name:  Timothy Yoder    MRN: [id number] DOB: 08-07-1955  07/01/2019  [name] was observed post Covid-19 immunization for 15 minutes without incident. He was provided with Vaccine Information Sheet and instruction to access the V-Safe system.   [name] was instructed to call 911 with any severe reactions post vaccine: Marland Kitchen Difficulty breathing  . Swelling of face and throat  . A fast heartbeat  . A bad rash all over body  . Dizziness and weakness   Immunizations Administered    Name Date Dose VIS Date Route   Pfizer COVID-19 Vaccine 07/01/2019  2:02 PM 0.3 mL 04/04/2019 Intramuscular   Manufacturer: [place]   Lot: [id number]   [place]: [id number]

## 2019-07-07 ENCOUNTER — Other Ambulatory Visit: Payer: Self-pay | Admitting: Oncology

## 2019-07-09 ENCOUNTER — Ambulatory Visit
Admission: RE | Admit: 2019-07-09 | Discharge: 2019-07-09 | Disposition: A | Discharge status: Home / Self Care | Payer: Medicare Other | Source: Ambulatory Visit | Attending: Oncology | Admitting: Oncology

## 2019-07-09 ENCOUNTER — Other Ambulatory Visit: Payer: Self-pay

## 2019-07-09 DIAGNOSIS — C3431 Malignant neoplasm of lower lobe, right bronchus or lung: Secondary | ICD-10-CM | POA: Diagnosis present

## 2019-07-09 MED ORDER — IOHEXOL 300 MG/ML  SOLN
75.0000 mL | Freq: Once | INTRAMUSCULAR | Status: AC | PRN
  Administered 2019-07-09: 75 mL via INTRAVENOUS

## 2019-07-11 ENCOUNTER — Other Ambulatory Visit: Payer: Self-pay

## 2019-07-11 ENCOUNTER — Encounter: Payer: Self-pay | Admitting: Oncology

## 2019-07-11 DIAGNOSIS — C3431 Malignant neoplasm of lower lobe, right bronchus or lung: Secondary | ICD-10-CM

## 2019-07-11 NOTE — Progress Notes (Signed)
Patient is coming in for follow up he is doing well no complaints

## 2019-07-14 ENCOUNTER — Other Ambulatory Visit: Payer: Self-pay

## 2019-07-14 ENCOUNTER — Inpatient Hospital Stay: Payer: Medicare Other

## 2019-07-14 ENCOUNTER — Inpatient Hospital Stay (HOSPITAL_BASED_OUTPATIENT_CLINIC_OR_DEPARTMENT_OTHER): Payer: Medicare Other | Admitting: Oncology

## 2019-07-14 ENCOUNTER — Encounter: Payer: Self-pay | Admitting: Oncology

## 2019-07-14 VITALS — HR 82

## 2019-07-14 VITALS — BP 134/82 | Temp 95.9°F | Resp 16 | Wt 165.3 lb

## 2019-07-14 DIAGNOSIS — C3431 Malignant neoplasm of lower lobe, right bronchus or lung: Secondary | ICD-10-CM

## 2019-07-14 DIAGNOSIS — C3490 Malignant neoplasm of unspecified part of unspecified bronchus or lung: Secondary | ICD-10-CM

## 2019-07-14 DIAGNOSIS — Z5112 Encounter for antineoplastic immunotherapy: Secondary | ICD-10-CM | POA: Diagnosis not present

## 2019-07-14 DIAGNOSIS — J449 Chronic obstructive pulmonary disease, unspecified: Secondary | ICD-10-CM | POA: Diagnosis not present

## 2019-07-14 DIAGNOSIS — E538 Deficiency of other specified B group vitamins: Secondary | ICD-10-CM

## 2019-07-14 LAB — CBC WITH DIFFERENTIAL/PLATELET
Abs Immature Granulocytes: 0.06 10*3/uL (ref 0.00–0.07)
Basophils Absolute: 0 10*3/uL (ref 0.0–0.1)
Basophils Relative: 1 %
Eosinophils Absolute: 0.2 10*3/uL (ref 0.0–0.5)
Eosinophils Relative: 3 %
HCT: 41.3 % (ref 39.0–52.0)
Hemoglobin: 13.9 g/dL (ref 13.0–17.0)
Immature Granulocytes: 1 %
Lymphocytes Relative: 19 %
Lymphs Abs: 1.1 10*3/uL (ref 0.7–4.0)
MCH: 31.4 pg (ref 26.0–34.0)
MCHC: 33.7 g/dL (ref 30.0–36.0)
MCV: 93.2 fL (ref 80.0–100.0)
Monocytes Absolute: 0.7 10*3/uL (ref 0.1–1.0)
Monocytes Relative: 13 %
Neutro Abs: 3.6 10*3/uL (ref 1.7–7.7)
Neutrophils Relative %: 63 %
Platelets: 209 10*3/uL (ref 150–400)
RBC: 4.43 MIL/uL (ref 4.22–5.81)
RDW: 13.2 % (ref 11.5–15.5)
WBC: 5.7 10*3/uL (ref 4.0–10.5)
nRBC: 0 % (ref 0.0–0.2)

## 2019-07-14 LAB — COMPREHENSIVE METABOLIC PANEL
ALT: 19 U/L (ref 0–44)
AST: 17 U/L (ref 15–41)
Albumin: 3.7 g/dL (ref 3.5–5.0)
Alkaline Phosphatase: 139 U/L — ABNORMAL HIGH (ref 38–126)
Anion gap: 8 (ref 5–15)
BUN: 9 mg/dL (ref 8–23)
CO2: 25 mmol/L (ref 22–32)
Calcium: 8.6 mg/dL — ABNORMAL LOW (ref 8.9–10.3)
Chloride: 105 mmol/L (ref 98–111)
Creatinine, Ser: 0.94 mg/dL (ref 0.61–1.24)
GFR calc Af Amer: >60 mL/min (ref >60)
GFR calc non Af Amer: >60 mL/min (ref >60)
Glucose, Bld: 82 mg/dL (ref 70–99)
Potassium: 3.9 mmol/L (ref 3.5–5.1)
Sodium: 138 mmol/L (ref 135–145)
Total Bilirubin: 0.8 mg/dL (ref 0.3–1.2)
Total Protein: 6.7 g/dL (ref 6.5–8.1)

## 2019-07-14 MED ORDER — HEPARIN SOD (PORK) LOCK FLUSH 100 UNIT/ML IV SOLN
500.0000 [IU] | Freq: Once | INTRAVENOUS | Status: AC | PRN
  Administered 2019-07-14: 12:00:00 500 [IU]
  Filled 2019-07-14: qty 5

## 2019-07-14 MED ORDER — SODIUM CHLORIDE 0.9 % IV SOLN
10.0000 mg/kg | Freq: Once | INTRAVENOUS | Status: AC
  Administered 2019-07-14: 11:00:00 740 mg via INTRAVENOUS
  Filled 2019-07-14: qty 10

## 2019-07-14 MED ORDER — HEPARIN SOD (PORK) LOCK FLUSH 100 UNIT/ML IV SOLN
INTRAVENOUS | Status: AC
  Filled 2019-07-14: qty 5

## 2019-07-14 MED ORDER — SODIUM CHLORIDE 0.9 % IV SOLN
Freq: Once | INTRAVENOUS | Status: AC
  Filled 2019-07-14: qty 250

## 2019-07-14 MED ORDER — SODIUM CHLORIDE 0.9% FLUSH
10.0000 mL | Freq: Once | INTRAVENOUS | Status: AC
  Administered 2019-07-14: 09:00:00 10 mL via INTRAVENOUS
  Filled 2019-07-14: qty 10

## 2019-07-14 NOTE — Progress Notes (Signed)
Hematology/Oncology follow up note [facility] Telephone:([phone]) [phone] Fax:([phone]) [phone]   Patient Care Team: Baxter Hire, MD as PCP - General (Internal Medicine) Sherren Mocha, MD as PCP - Cardiology (Cardiology) Telford Nab, RN as Registered Nurse  REFERRING PROVIDER: Dr.Fleming REASON FOR VISIT:  Follow-up for management of stage III non-small cell lung cancer   HISTORY OF PRESENTING ILLNESS:  Timothy Yoder is a  64 y.o.  male with PMH listed below who was referred to me for evaluation of lung mass.  Patient is a former smoker quit smoking 7 years ago.  40-pack-year. Patient chest lung cancer screening on 03/11/2018. CT scan showed 2.4 cm right lower lobe nodule with a new necrotic.  Subcarinal lymph node. PET scan showed hypermetabolic right lower pulmonary nodule, consistent with primary bronchogenic carcinoma.  Fluid density subcarinal structure demonstrate no significant hypermetabolism.  Although this could represent an incidental benign lesion such as a cyst given the interval development since 02/24/2017, necrotic adenopathy cannot be excluded and tissue sampling should be considered.  # underwent biopsy via bronchoscopy.  Subcarinal lymph node biopsy positive for non-small cell lung cancer, favoring poorly differentiated adenocarcinoma.  Stage IIIA T1c N2M0 #Mediport was placed by Dr. Genevive Bi  # #Elevated alkaline phosphatase. Patient has been evaluated by gastroenterology.  Liver biopsy showed mild sinusoidal dilation and congestion.  Minimal changes suggestive of hepatic process. Iron stain demonstrate minimal focal hepatocellular iron. I discussed with Dr.Wohl via secure chat about patient's pathology result.  No restriction of proceeding with immunotherapy.  Continue watch for the labs.   # Cancer Treatment 04/26/2018 -06/11/2018 Concurrent Chemotherapy [weekly Carbo and Taxol] with RT.  07/10/2018 Started on maintenance Durvalumab.  INTERVAL  HISTORY Timothy Yoder is a 64 y.o. male who has above history reviewed by me today presents for follow up visit for management of stage III non-small cell lung cancer.   Patient has been on maintenance immunotherapy. No new complaints.  Chronic shortness of breath with exertion.  Review of Systems  Constitutional: Positive for fatigue. Negative for appetite change, chills, fever and unexpected weight change.  HENT:   Negative for hearing loss and voice change.   Eyes: Negative for eye problems and icterus.  Respiratory: Positive for shortness of breath. Negative for chest tightness and cough.   Cardiovascular: Negative for chest pain and leg swelling.  Gastrointestinal: Negative for abdominal distention and abdominal pain.  Endocrine: Negative for hot flashes.  Genitourinary: Negative for difficulty urinating, dysuria and frequency.   Musculoskeletal: Negative for arthralgias.  Skin: Negative for itching and rash.  Neurological: Negative for light-headedness and numbness.  Hematological: Negative for adenopathy. Does not bruise/bleed easily.  Psychiatric/Behavioral: Negative for confusion.   MEDICAL HISTORY:  Past Medical History:  Diagnosis Date  . BPH (benign prostatic hyperplasia)   . CAD (coronary artery disease)    a. 04/2014 anterolateral STEMI/PCI: LM nl, LAD 69m(2.75x16 Promus DES, D1 50p, LCX nl, RI nl, OM1 100 small/chronic, RCA dominant, 20-373m708m(FFR 0.90->Med Rx), RPDA 50, RPLA 70, EF 60%.  . CMarland KitchenPD (chronic obstructive pulmonary disease) (HCC)    Dr FleRaul Del Former tobacco use   . Gastroesophageal reflux disease   . HTN (hypertension)   . Hyperlipidemia   . Hypokalemia   . Malignant [place] of [place] ([place]  . [place]   Dr MicSherren Mocha Stented coronary artery     SURGICAL HISTORY: Past Surgical History:  Procedure Laterality Date  . CARDIAC  CATHETERIZATION    . CHOLECYSTECTOMY N/A 02/15/2016   Procedure:  LAPAROSCOPIC CHOLECYSTECTOMY WITH INTRAOPERATIVE CHOLANGIOGRAM;  Surgeon: Leonie Green, MD;  Location: [facility] ORS;  Service: General;  Laterality: N/A;  . COLONOSCOPY    . COLONOSCOPY WITH PROPOFOL N/A 08/02/2015   Procedure: COLONOSCOPY WITH PROPOFOL;  Surgeon: Manya Silvas, MD;  Location: [facility] ENDOSCOPY;  Service: Endoscopy;  Laterality: N/A;  . CORONARY ANGIOPLASTY  04/2014   STENT  . ENDOBRONCHIAL ULTRASOUND N/A 03/26/2018   Procedure: ENDOBRONCHIAL ULTRASOUND;  Surgeon: Flora Lipps, MD;  Location: [facility] ORS;  Service: Cardiopulmonary;  Laterality: N/A;  . ESOPHAGOGASTRODUODENOSCOPY (EGD) WITH PROPOFOL N/A 08/02/2015   Procedure: ESOPHAGOGASTRODUODENOSCOPY (EGD) WITH PROPOFOL;  Surgeon: Manya Silvas, MD;  Location: [facility] ENDOSCOPY;  Service: Endoscopy;  Laterality: N/A;  . FLEXIBLE BRONCHOSCOPY N/A 03/26/2018   Procedure: FLEXIBLE BRONCHOSCOPY;  Surgeon: Flora Lipps, MD;  Location: [facility] ORS;  Service: Cardiopulmonary;  Laterality: N/A;  . KNEE ARTHROSCOPY Left   . LEFT HEART CATHETERIZATION WITH CORONARY ANGIOGRAM N/A 05/03/2014   Procedure: LEFT HEART CATHETERIZATION WITH CORONARY ANGIOGRAM;  Surgeon: Blane Ohara, MD;  Location: [facility] CATH LAB;  Service: Cardiovascular;  Laterality: N/A;  . PERCUTANEOUS CORONARY STENT INTERVENTION (PCI-S) N/A 05/05/2014   Procedure: PERCUTANEOUS CORONARY STENT INTERVENTION (PCI-S);  Surgeon: Peter M Martinique, MD;  Location: [facility] CATH LAB;  Service: Cardiovascular;  Laterality: N/A;  . PORTACATH PLACEMENT Left 04/04/2018   Procedure: INSERTION PORT-A-CATH;  Surgeon: [name], MD;  Location: [facility] ORS;  Service: General;  Laterality: Left;    SOCIAL HISTORY: Social History   Socioeconomic History  . Marital status: Married    Spouse name: Not on file  . Number of children: Not on file  . Years of education: Not on file  . Highest education level: Not on file  Occupational History  . Not on file  Tobacco Use  . Smoking status: Former  Smoker    Packs/day: 1.00    Years: 40.00    Pack years: 40.00    Types: Cigarettes    Quit date: 02/14/2011    Years since quitting: 8.4  . Smokeless tobacco: Never Used  Substance and Sexual Activity  . Alcohol use: No  . Drug use: No  . Sexual activity: Not on file  Other Topics Concern  . Not on file  Social History Narrative   The patient is married. He lives in [place]. He works full-time. He quit smoking in 2013.   Social Determinants of Health   Financial Resource Strain:   . Difficulty of Paying Living Expenses:   Food Insecurity:   . Worried About Charity fundraiser in the Last Year:   . Arboriculturist in the Last Year:   Transportation Needs:   . Film/video editor (Medical):   Marland Kitchen Lack of Transportation (Non-Medical):   Physical Activity:   . Days of Exercise per Week:   . Minutes of Exercise per Session:   Stress:   . Feeling of Stress :   Social Connections:   . Frequency of Communication with Friends and Family:   . Frequency of Social Gatherings with Friends and Family:   . Attends Religious Services:   . Active Member of Clubs or Organizations:   . Attends Archivist Meetings:   Marland Kitchen Marital Status:   Intimate Partner Violence:   . Fear of Current or Ex-Partner:   . Emotionally Abused:   Marland Kitchen Physically Abused:   . Sexually Abused:  FAMILY HISTORY: Family History  Problem Relation Age of Onset  . CAD Mother 16       Died of an MI  . CAD Father 20       Died of a massive MI  . COPD Sister   . Lung cancer Brother   . Bone cancer Paternal Uncle   . Colon cancer Neg Hx   . Breast cancer Neg Hx     ALLERGIES:  has No Known Allergies.  MEDICATIONS:  Current Outpatient Medications  Medication Sig Dispense Refill  . acetaminophen (TYLENOL) 500 MG tablet Take 1,000 mg by mouth every 6 (six) hours as needed (for pain.).    Marland Kitchen albuterol (PROAIR HFA) 108 (90 Base) MCG/ACT inhaler Inhale 2 puffs into the lungs every 6 (six) hours as  needed for wheezing or shortness of breath.     [name] ELLIPTA 62.5-25 MCG/INH AEPB INHALE 1 PUFF INTO THE LUNGS DAILY 180 each 0  . aspirin EC 81 MG tablet Take 81 mg by mouth daily.    Marland Kitchen atorvastatin (LIPITOR) 80 MG tablet Take 1 tablet (80 mg total) by mouth daily at 6 PM. Please make yearly appt with Dr. Burt Knack for May for future refills. 1st attempt 90 tablet 0  . carvedilol (COREG) 6.25 MG tablet Take 1 tablet (6.25 mg total) by mouth 2 (two) times daily. 180 tablet 3  . finasteride (PROSCAR) 5 MG tablet Take by mouth.    . folic acid (FOLVITE) 1 MG tablet Take 1 tablet (1 mg total) by mouth daily. 90 tablet 1  . lidocaine-prilocaine (EMLA) cream Apply small amount to port site 1 hour prior to [facility] being accessed and cover with plastic wrap 30 g 1  . nitroGLYCERIN (NITROSTAT) 0.4 MG SL tablet Place 1 tablet (0.4 mg total) under the tongue every 5 (five) minutes as needed for chest pain. X 3 doses 25 tablet 3  . omeprazole (PRILOSEC) 40 MG capsule Take 1 capsule (40 mg total) by mouth 2 (two) times daily. 180 capsule 1  . ondansetron (ZOFRAN) 8 MG tablet PLEASE SEE ATTACHED FOR DETAILED DIRECTIONS    . potassium chloride SA (KLOR-CON M20) 20 MEQ tablet Take 1 tablet (20 mEq total) by mouth daily. 90 tablet 3  . sucralfate (CARAFATE) 1 g tablet Take 1 tablet (1 g total) by mouth 3 (three) times daily before meals. Dissolve in warm water, swish and swallow 90 tablet 6  . tamsulosin (FLOMAX) 0.4 MG CAPS capsule Take 0.4 mg by mouth daily after supper.    . traZODone (DESYREL) 50 MG tablet Take 1 tablet (50 mg total) by mouth at bedtime. 90 tablet 0  . vitamin B-12 (CYANOCOBALAMIN) 1000 MCG tablet Take 1 tablet (1,000 mcg total) by mouth daily. 90 tablet 1   No current facility-administered medications for this visit.     PHYSICAL EXAMINATION: ECOG PERFORMANCE STATUS: 1 - Symptomatic but completely ambulatory Vitals:   07/14/19 0935  BP: 134/82  Resp: 16  Temp: (!) 95.9 F (35.5 C)    Filed Weights   07/14/19 0935  Weight: 165 lb 4.8 oz (75 kg)    Physical Exam Constitutional:      General: He is not in acute distress. HENT:     Head: Normocephalic and atraumatic.  Eyes:     General: No scleral icterus. Cardiovascular:     Rate and Rhythm: Normal rate and regular rhythm.     Heart sounds: Normal heart sounds.  Pulmonary:     Effort:  Pulmonary effort is normal. No respiratory distress.     Breath sounds: No wheezing.  Abdominal:     General: Bowel sounds are normal. There is no distension.     Palpations: Abdomen is soft.  Musculoskeletal:        General: No deformity. Normal range of motion.     Cervical back: Normal range of motion and neck supple.  Skin:    General: Skin is warm and dry.     Findings: No erythema or rash.  Neurological:     Mental Status: He is alert and oriented to person, place, and time. Mental status is at baseline.     Cranial Nerves: No cranial nerve deficit.     Coordination: Coordination normal.  Psychiatric:        Mood and Affect: Mood normal.      LABORATORY DATA:  I have reviewed the data as listed Lab Results  Component Value Date   WBC 5.7 07/14/2019   HGB 13.9 07/14/2019   HCT 41.3 07/14/2019   MCV 93.2 07/14/2019   PLT 209 07/14/2019   Recent Labs    06/16/19 0923 06/30/19 0835 07/14/19 0906  NA 137 135 138  K 3.9 3.8 3.9  CL 105 103 105  CO2 _0 GLUCOSE 116* 135* 82  BUN _1 CREATININE 1.03 1.06 0.94  CALCIUM 8.6* 8.2* 8.6*  GFRNONAA >60 >60 >60  GFRAA >60 >60 >60  PROT 6.8 6.5 6.7  ALBUMIN 3.8 3.7 3.7  AST _2 ALT _3 ALKPHOS 120 117 139*  BILITOT 0.7 0.7 0.8   Iron/TIBC/Ferritin/ %Sat    Component Value Date/Time   IRON 41 (L) 11/21/2018 0856   TIBC 260 11/21/2018 0856   FERRITIN 150 11/21/2018 0856   IRONPCTSAT 16 (L) 11/21/2018 0856    RADIOGRAPHIC STUDIES: I have personally reviewed the radiological images as listed and agreed with the findings in the  report.  CT Chest Wo Contrast  Result Date: 04/23/2019 CLINICAL DATA:  Stage IIIA right lower lobe lung adenocarcinoma status post concurrent chemotherapy with RT and maintenance immunotherapy. Restaging. EXAM: CT CHEST WITHOUT CONTRAST TECHNIQUE: Multidetector CT imaging of the chest was performed following the standard protocol without IV contrast. COMPARISON:  01/20/2019 chest CT. FINDINGS: Cardiovascular: Normal heart size. No significant pericardial effusion/thickening. Three-vessel coronary atherosclerosis. Left subclavian Port-A-Cath terminates in lower third of the SVC. Atherosclerotic nonaneurysmal thoracic aorta. Normal caliber pulmonary arteries. Mediastinum/Nodes: No discrete thyroid nodules. Unremarkable esophagus. No pathologically enlarged axillary, mediastinal or hilar lymph nodes, noting limited sensitivity for the detection of hilar adenopathy on this noncontrast study. Lungs/Pleura: No pneumothorax. No pleural effusion. Moderate centrilobular emphysema. Masslike fibrosis in the medial right lower lobe measures 3.5 x 1.7 cm (series 3/image 122), previously 3.6 x 2.1 cm, slightly decreased, with decreasing surrounding faint patchy ground-glass opacities. No acute consolidative airspace disease or new significant pulmonary nodules. Scattered calcified subcentimeter granulomas in both lungs are unchanged. Upper abdomen: Small hiatal hernia.  Cholecystectomy. Musculoskeletal:  No aggressive appearing focal osseous lesions. IMPRESSION: 1. Continued expected evolution of postradiation changes in the medial right lower lobe, with no evidence of local tumor recurrence. 2. No findings of metastatic disease in the chest. Aortic Atherosclerosis (ICD10-I70.0) and Emphysema (ICD10-J43.9). Electronically Signed   By: Ilona Sorrel M.D.   On: 04/23/2019 09:12   CT Chest W Contrast  Result Date: 07/09/2019 CLINICAL DATA:  RIGHT lung cancer diagnosed 2019. Chemotherapy complete. Currently on immunotherapy.  Short of breath. EXAM: CT CHEST WITH CONTRAST TECHNIQUE: Multidetector CT imaging of the chest was performed during intravenous contrast administration. CONTRAST:  43m OMNIPAQUE IOHEXOL 300 MG/ML  SOLN COMPARISON:  CT 04/23/2019 FINDINGS: Cardiovascular: Coronary artery calcification and aortic atherosclerotic calcification. Port in the anterior chest wall with tip in distal SVC. Mediastinum/Nodes: No axillary supraclavicular adenopathy. No mediastinal hilar adenopathy. No pericardial effusion. Lungs/Pleura: Mild centrilobular emphysema in the upper lobes. Peribronchial thickening in the RIGHT lower lobe at radiation therapy site. No new or suspicious nodularity. Several scattered calcified granulomas in lung noted. Upper Abdomen: Limited view of the liver, kidneys, pancreas are unremarkable. Normal adrenal glands. Musculoskeletal: No aggressive osseous lesion IMPRESSION: 1. Stable post radiation change in the RIGHT lower lobe. 2. No evidence of new or progressive lung carcinoma. 3. Coronary artery calcification and Aortic Atherosclerosis (ICD10-I70.0). Electronically Signed   By: SSuzy BouchardM.D.   On: 07/09/2019 11:31     ASSESSMENT & PLAN:  1. Malignant neoplasm of lung, unspecified laterality, unspecified part of lung (H[place]   2. Folate deficiency   3. Encounter for antineoplastic immunotherapy   4. Chronic obstructive pulmonary disease, unspecified COPD type (HEly   Cancer Staging Malignant neoplasm of lower lobe of right lung ([facility] Staging form: Lung, AJCC 8th Edition - Clinical stage from 05/13/2018: Stage IIIA (cT1c, cN2, cM0) - Signed by YEarlie Server MD on 05/13/2018  #Stage IIIA lung adenocarcinoma Labs are reviewed and discussed with patient. CT chest w contrast was independently reviewed and discussed with patient.  Stable post radiation changes. No evidence of new or progressive lung carcinoma.  Coronary artery calcification and aortic atherosclerosis.   #Elevated alkaline  phosphatase, etiology unknown.  Possibly due to immunotherapy.  Alkaline phosphatase level slight trended up. Continue to monitor.  #COPD, continue follow-up with pulmonology.  Continue Anoro.   Recommend patient to follow-up with pulmonologist and he also has an appointment with cardiologist # history of folate deficiency, also low normal B12, repeat folate and vitamin B12 level at next visit.  Follow-up in 2 weeks evaluation prior to next cycle of durvalumab treatments.   ZEarlie Server MD, PhD 07/14/19

## 2019-07-15 MED ORDER — TRAZODONE HCL 50 MG PO TABS: 50.0000 mg | ORAL_TABLET | Freq: Every day | ORAL | 0 refills | Status: DC

## 2019-07-18 NOTE — Progress Notes (Signed)
Pharmacist Chemotherapy Monitoring - Follow Up Assessment    I verify that I have reviewed each item in the below checklist:  . Regimen for the patient is scheduled for the appropriate day and plan matches scheduled date. Marland Kitchen Appropriate non-routine labs are ordered dependent on drug ordered. . If applicable, additional medications reviewed and ordered per protocol based on lifetime cumulative doses and/or treatment regimen.   Plan for follow-up and/or issues identified: No . I-vent associated with next due treatment: No . MD and/or nursing notified: No  Miette Molenda K 07/18/2019 8:34 AM

## 2019-07-28 ENCOUNTER — Inpatient Hospital Stay: Payer: Medicare Other | Attending: Oncology

## 2019-07-28 ENCOUNTER — Other Ambulatory Visit: Payer: Self-pay

## 2019-07-28 ENCOUNTER — Encounter: Payer: Self-pay | Admitting: Oncology

## 2019-07-28 ENCOUNTER — Inpatient Hospital Stay: Payer: Medicare Other

## 2019-07-28 ENCOUNTER — Inpatient Hospital Stay (HOSPITAL_BASED_OUTPATIENT_CLINIC_OR_DEPARTMENT_OTHER): Payer: Medicare Other | Admitting: Oncology

## 2019-07-28 VITALS — BP 151/81 | HR 70 | Temp 95.3°F | Resp 18 | Wt 164.4 lb

## 2019-07-28 DIAGNOSIS — Z5112 Encounter for antineoplastic immunotherapy: Secondary | ICD-10-CM | POA: Insufficient documentation

## 2019-07-28 DIAGNOSIS — J449 Chronic obstructive pulmonary disease, unspecified: Secondary | ICD-10-CM

## 2019-07-28 DIAGNOSIS — C3431 Malignant neoplasm of lower lobe, right bronchus or lung: Secondary | ICD-10-CM

## 2019-07-28 DIAGNOSIS — R748 Abnormal levels of other serum enzymes: Secondary | ICD-10-CM | POA: Insufficient documentation

## 2019-07-28 DIAGNOSIS — Z79899 Other long term (current) drug therapy: Secondary | ICD-10-CM | POA: Diagnosis not present

## 2019-07-28 DIAGNOSIS — Z95828 Presence of other vascular implants and grafts: Secondary | ICD-10-CM

## 2019-07-28 DIAGNOSIS — E538 Deficiency of other specified B group vitamins: Secondary | ICD-10-CM

## 2019-07-28 DIAGNOSIS — C3490 Malignant neoplasm of unspecified part of unspecified bronchus or lung: Secondary | ICD-10-CM

## 2019-07-28 LAB — CBC WITH DIFFERENTIAL/PLATELET
Abs Immature Granulocytes: 0.05 10*3/uL (ref 0.00–0.07)
Basophils Absolute: 0 10*3/uL (ref 0.0–0.1)
Basophils Relative: 1 %
Eosinophils Absolute: 0.1 10*3/uL (ref 0.0–0.5)
Eosinophils Relative: 2 %
HCT: 40.4 % (ref 39.0–52.0)
Hemoglobin: 13.7 g/dL (ref 13.0–17.0)
Immature Granulocytes: 1 %
Lymphocytes Relative: 15 %
Lymphs Abs: 0.9 10*3/uL (ref 0.7–4.0)
MCH: 31.7 pg (ref 26.0–34.0)
MCHC: 33.9 g/dL (ref 30.0–36.0)
MCV: 93.5 fL (ref 80.0–100.0)
Monocytes Absolute: 0.7 10*3/uL (ref 0.1–1.0)
Monocytes Relative: 12 %
Neutro Abs: 4.3 10*3/uL (ref 1.7–7.7)
Neutrophils Relative %: 69 %
Platelets: 203 10*3/uL (ref 150–400)
RBC: 4.32 MIL/uL (ref 4.22–5.81)
RDW: 13.1 % (ref 11.5–15.5)
WBC: 6.2 10*3/uL (ref 4.0–10.5)
nRBC: 0 % (ref 0.0–0.2)

## 2019-07-28 LAB — COMPREHENSIVE METABOLIC PANEL
ALT: 19 U/L (ref 0–44)
AST: 19 U/L (ref 15–41)
Albumin: 3.7 g/dL (ref 3.5–5.0)
Alkaline Phosphatase: 163 U/L — ABNORMAL HIGH (ref 38–126)
Anion gap: 5 (ref 5–15)
BUN: 9 mg/dL (ref 8–23)
CO2: 26 mmol/L (ref 22–32)
Calcium: 8.6 mg/dL — ABNORMAL LOW (ref 8.9–10.3)
Chloride: 106 mmol/L (ref 98–111)
Creatinine, Ser: 0.98 mg/dL (ref 0.61–1.24)
GFR calc Af Amer: >60 mL/min (ref >60)
GFR calc non Af Amer: >60 mL/min (ref >60)
Glucose, Bld: 90 mg/dL (ref 70–99)
Potassium: 3.9 mmol/L (ref 3.5–5.1)
Sodium: 137 mmol/L (ref 135–145)
Total Bilirubin: 0.6 mg/dL (ref 0.3–1.2)
Total Protein: 6.7 g/dL (ref 6.5–8.1)

## 2019-07-28 LAB — VITAMIN B12: Vitamin B-12: 817 pg/mL (ref 180–914)

## 2019-07-28 LAB — FOLATE: Folate: 12.3 ng/mL (ref >5.9)

## 2019-07-28 LAB — TSH: TSH: 1.127 u[IU]/mL (ref 0.350–4.500)

## 2019-07-28 MED ORDER — SODIUM CHLORIDE 0.9 % IV SOLN
10.0000 mg/kg | Freq: Once | INTRAVENOUS | Status: AC
  Administered 2019-07-28: 740 mg via INTRAVENOUS
  Filled 2019-07-28: qty 10

## 2019-07-28 MED ORDER — HEPARIN SOD (PORK) LOCK FLUSH 100 UNIT/ML IV SOLN
500.0000 [IU] | Freq: Once | INTRAVENOUS | Status: AC | PRN
  Administered 2019-07-28: 11:00:00 500 [IU]
  Filled 2019-07-28: qty 5

## 2019-07-28 MED ORDER — SODIUM CHLORIDE 0.9% FLUSH
10.0000 mL | Freq: Once | INTRAVENOUS | Status: AC
  Administered 2019-07-28: 10 mL via INTRAVENOUS
  Filled 2019-07-28: qty 10

## 2019-07-28 MED ORDER — SODIUM CHLORIDE 0.9 % IV SOLN
Freq: Once | INTRAVENOUS | Status: AC
  Filled 2019-07-28: qty 250

## 2019-07-28 NOTE — Progress Notes (Signed)
Hematology/Oncology follow up note Timothy Yoder [phone] [phone]   Patient Care Team: Baxter Hire, MD as PCP - General (Internal Medicine) Sherren Mocha, MD as PCP - Cardiology (Cardiology) Telford Nab, RN as Registered Nurse  REFERRING PROVIDER: Dr.Fleming REASON FOR VISIT:  Follow-up for management of stage III non-small cell lung cancer   HISTORY OF PRESENTING ILLNESS:  Timothy Yoder is a  64 y.o.  male with PMH listed below who was referred to me for evaluation of lung mass.  Patient is a former smoker quit smoking 7 years ago.  40-pack-year. Patient chest lung cancer screening on 03/11/2018. CT scan showed 2.4 cm right lower lobe nodule with a new necrotic.  Subcarinal lymph node. PET scan showed hypermetabolic right lower pulmonary nodule, consistent with primary bronchogenic carcinoma.  Fluid density subcarinal structure demonstrate no significant hypermetabolism.  Although this could represent an incidental benign lesion such as a cyst given the interval development since 02/24/2017, necrotic adenopathy cannot be excluded and tissue sampling should be considered.  # underwent biopsy via bronchoscopy.  Subcarinal lymph node biopsy positive for non-small cell lung cancer, favoring poorly differentiated adenocarcinoma.  Stage IIIA T1c N2M0 #Mediport was placed by Dr. Genevive Bi  # #Elevated alkaline phosphatase. Patient has been evaluated by gastroenterology.  Liver biopsy showed mild sinusoidal dilation and congestion.  Minimal changes suggestive of hepatic process. Iron stain demonstrate minimal focal hepatocellular iron. I discussed with Dr.Wohl via secure chat about patient's pathology result.  No restriction of proceeding with immunotherapy.  Continue watch for the labs.   # Cancer Treatment 04/26/2018 -06/11/2018 Concurrent Chemotherapy [weekly Carbo and Taxol] with RT.  07/10/2018 Started on maintenance Durvalumab.  INTERVAL  HISTORY Timothy Yoder is a 64 y.o. male who has above history reviewed by me today presents for follow up visit for management of stage III non-small cell lung cancer.   Patient has been on maintenance immunotherapy. No new complaints.  Chronic shortness of breath with exertion, he has appointment to see cardiology and pulmonology for work up.   Review of Systems  Constitutional: Positive for fatigue. Negative for appetite change, chills, fever and unexpected weight change.  HENT:   Negative for hearing loss and voice change.   Eyes: Negative for eye problems and icterus.  Respiratory: Positive for shortness of breath. Negative for chest tightness and cough.   Cardiovascular: Negative for chest pain and leg swelling.  Gastrointestinal: Negative for abdominal distention and abdominal pain.  Endocrine: Negative for hot flashes.  Genitourinary: Negative for difficulty urinating, dysuria and frequency.   Musculoskeletal: Negative for arthralgias.  Skin: Negative for itching and rash.  Neurological: Negative for light-headedness and numbness.  Hematological: Negative for adenopathy. Does not bruise/bleed easily.  Psychiatric/Behavioral: Negative for confusion.   MEDICAL HISTORY:  Past Medical History:  Diagnosis Date  . BPH (benign prostatic hyperplasia)   . CAD (coronary artery disease)    a. 04/2014 anterolateral STEMI/PCI: LM nl, LAD 71m(2.75x16 Promus DES, D1 50p, LCX nl, RI nl, OM1 100 small/chronic, RCA dominant, 20-377m7072m(FFR 0.90->Med Rx), RPDA 50, RPLA 70, EF 60%.  . CMarland KitchenPD (chronic obstructive pulmonary disease) (HCC)    Dr FleRaul Del Former tobacco use   . Gastroesophageal reflux disease   . HTN (hypertension)   . Hyperlipidemia   . Hypokalemia   . [place] of [place]  . [place] infarction ([place]   Dr MicSherren Mocha Stented coronary artery  SURGICAL HISTORY: Past Surgical History:  Procedure Laterality Date  . CARDIAC  CATHETERIZATION    . CHOLECYSTECTOMY N/A 02/15/2016   Procedure: LAPAROSCOPIC CHOLECYSTECTOMY WITH INTRAOPERATIVE CHOLANGIOGRAM;  Surgeon: Leonie Green, MD;  Location: [facility] ORS;  Service: General;  Laterality: N/A;  . COLONOSCOPY    . COLONOSCOPY WITH PROPOFOL N/A 08/02/2015   Procedure: COLONOSCOPY WITH PROPOFOL;  Surgeon: Manya Silvas, MD;  Location: [facility] ENDOSCOPY;  Service: Endoscopy;  Laterality: N/A;  . CORONARY ANGIOPLASTY  04/2014   STENT  . ENDOBRONCHIAL ULTRASOUND N/A 03/26/2018   Procedure: ENDOBRONCHIAL ULTRASOUND;  Surgeon: Flora Lipps, MD;  Location: [facility] ORS;  Service: Cardiopulmonary;  Laterality: N/A;  . ESOPHAGOGASTRODUODENOSCOPY (EGD) WITH PROPOFOL N/A 08/02/2015   Procedure: ESOPHAGOGASTRODUODENOSCOPY (EGD) WITH PROPOFOL;  Surgeon: Manya Silvas, MD;  [facility] END[facility]COPY;  Service: Endoscopy;  Laterality: N/A;  . FLEXIBLE BRONCHOSCOPY N/A 03/26/2018   Procedure: FLEXIBLE BRONCHOSCOPY;  Surgeon: Flora Lipps, MD;  Location: [facility] ORS;  Service: Cardiopulmonary;  Laterality: N/A;  . KNEE ARTHROSCOPY Left   . LEFT HEART CATHETERIZATION WITH CORONARY ANGIOGRAM N/A 05/03/2014   Procedure: LEFT HEART CATHETERIZATION WITH CORONARY ANGIOGRAM;  Surgeon: Blane Ohara, MD;  Location: [facility] CATH LAB;  Service: Cardiovascular;  Laterality: N/A;  . PERCUTANEOUS CORONARY STENT INTERVENTION (PCI-S) N/A 05/05/2014   Procedure: PERCUTANEOUS CORONARY STENT INTERVENTION (PCI-S);  Surgeon: Peter M Martinique, MD;  Location: [facility] CATH LAB;  Service: Cardiovascular;  Laterality: N/A;  . PORTACATH PLACEMENT Left 04/04/2018   Procedure: INSERTION PORT-A-CATH;  Surgeon: [name], MD;  Location: [facility] ORS;  Service: General;  Laterality: Left;    SOCIAL HISTORY: Social History   Socioeconomic History  . Marital status: Married    Spouse name: Not on file  . Number of children: Not on file  . Years of education: Not on file  . Highest education level: Not on file  Occupational  History  . Not on file  Tobacco Use  . Smoking status: Former Smoker    Packs/day: 1.00    Years: 40.00    Pack years: 40.00    Types: Cigarettes    Quit date: 02/14/2011    Years since quitting: 8.4  . Smokeless tobacco: Never Used  Substance and Sexual Activity  . Alcohol use: No  . Drug use: No  . Sexual activity: Not on file  Other Topics Concern  . Not on file  Social History Narrative   The patient is married. He lives in [place]. He works full-time. He quit smoking in 2013.   Social Determinants of Health   Financial Resource Strain:   . Difficulty of Paying Living Expenses:   Food Insecurity:   . Worried About Charity fundraiser in the Last Year:   . Arboriculturist in the Last Year:   Transportation Needs:   . Film/video editor (Medical):   Marland Kitchen Lack of Transportation (Non-Medical):   Physical Activity:   . Days of Exercise per Week:   . Minutes of Exercise per Session:   Stress:   . Feeling of Stress :   Social Connections:   . Frequency of Communication with Friends and Family:   . Frequency of Social Gatherings with Friends and Family:   . Attends Religious [place]:   . Active Member of Clubs or Organizations:   . Attends Archivist Meetings:   Marland Kitchen Marital Status:   Intimate Partner Violence:   . Fear of Current or Ex-Partner:   . Emotionally Abused:   .  Physically Abused:   . Sexually Abused:     FAMILY HISTORY: Family History  Problem Relation Age of Onset  . CAD Mother 108       Died of an MI  . CAD Father 57       Died of a massive MI  . COPD Sister   . Lung cancer Brother   . Bone cancer Paternal Uncle   . Colon cancer Neg Hx   . Breast cancer Neg Hx     ALLERGIES:  has No Known Allergies.  MEDICATIONS:  Current Outpatient Medications  Medication Sig Dispense Refill  . acetaminophen (TYLENOL) 500 MG tablet Take 1,000 mg by mouth every 6 (six) hours as needed (for pain.).    Marland Kitchen albuterol (PROAIR HFA) 108 (90 Base)  MCG/ACT inhaler Inhale 2 puffs into the lungs every 6 (six) hours as needed for wheezing or shortness of breath.     [name] ELLIPTA 62.5-25 MCG/INH AEPB INHALE 1 PUFF INTO THE LUNGS DAILY 180 each 0  . aspirin EC 81 MG tablet Take 81 mg by mouth daily.    Marland Kitchen atorvastatin (LIPITOR) 80 MG tablet Take 1 tablet (80 mg total) by mouth daily at 6 PM. Please make yearly appt with Dr. Burt Knack for May for future refills. 1st attempt 90 tablet 0  . carvedilol (COREG) 6.25 MG tablet Take 1 tablet (6.25 mg total) by mouth 2 (two) times daily. 180 tablet 3  . finasteride (PROSCAR) 5 MG tablet Take by mouth.    . lidocaine-prilocaine (EMLA) cream Apply small amount to port site 1 hour prior to [facility] being accessed and cover with plastic wrap 30 g 1  . nitroGLYCERIN (NITROSTAT) 0.4 MG SL tablet Place 1 tablet (0.4 mg total) under the tongue every 5 (five) minutes as needed for chest pain. X 3 doses 25 tablet 3  . omeprazole (PRILOSEC) 40 MG capsule Take 1 capsule (40 mg total) by mouth 2 (two) times daily. 180 capsule 1  . ondansetron (ZOFRAN) 8 MG tablet PLEASE SEE ATTACHED FOR DETAILED DIRECTIONS    . potassium chloride SA (KLOR-CON M20) 20 MEQ tablet Take 1 tablet (20 mEq total) by mouth daily. 90 tablet 3  . sucralfate (CARAFATE) 1 g tablet Take 1 tablet (1 g total) by mouth 3 (three) times daily before meals. Dissolve in warm water, swish and swallow 90 tablet 6  . tamsulosin (FLOMAX) 0.4 MG CAPS capsule Take 0.4 mg by mouth daily after supper.    . traZODone (DESYREL) 50 MG tablet Take 1 tablet (50 mg total) by mouth at bedtime. 90 tablet 0   No current facility-administered medications for this visit.   Facility-Administered Medications Ordered in Other Visits  Medication Dose Route Frequency Provider Last Rate Last Admin  . 0.9 %  sodium chloride infusion   Intravenous Once Earlie Server, MD      . durvalumab [facility]) 740 mg in sodium chloride 0.9 % 100 mL chemo infusion  10 mg/kg (Treatment Plan Recorded)  Intravenous Once Earlie Server, MD      . heparin lock flush 100 unit/mL  500 Units Intracatheter Once PRN Earlie Server, MD         PHYSICAL EXAMINATION: ECOG PERFORMANCE STATUS: 1 - Symptomatic but completely ambulatory Vitals:   07/28/19 0846  BP: (!) 151/81  Pulse: 70  Resp: 18  Temp: (!) 95.3 F (35.2 C)  SpO2: 97%   Filed Weights   07/28/19 0846  Weight: 164 lb 6.4 oz (74.6 kg)  Physical Exam Constitutional:      General: He is not in acute distress. HENT:     Head: Normocephalic and atraumatic.  Eyes:     General: No scleral icterus. Cardiovascular:     Rate and Rhythm: Normal rate and regular rhythm.     Heart sounds: Normal heart sounds.  Pulmonary:     Effort: Pulmonary effort is normal. No respiratory distress.     Breath sounds: No wheezing.  Abdominal:     General: Bowel sounds are normal. There is no distension.     Palpations: Abdomen is soft.  Musculoskeletal:        General: No deformity. Normal range of motion.     Cervical back: Normal range of motion and neck supple.  Skin:    General: Skin is warm and dry.     Findings: No erythema or rash.  Neurological:     Mental Status: He is alert and oriented to person, place, and time. Mental status is at baseline.     Cranial Nerves: No cranial nerve deficit.     Coordination: Coordination normal.  Psychiatric:        Mood and Affect: Mood normal.      LABORATORY DATA:  I have reviewed the data as listed Lab Results  Component Value Date   WBC 6.2 07/28/2019   HGB 13.7 07/28/2019   HCT 40.4 07/28/2019   MCV 93.5 07/28/2019   PLT 203 07/28/2019   Recent Labs    06/30/19 0835 07/14/19 0906 07/28/19 0835  NA 135 138 137  K 3.8 3.9 3.9  CL 103 105 106  CO2 _0 GLUCOSE 135* 82 90  BUN _1 CREATININE 1.06 0.94 0.98  CALCIUM 8.2* 8.6* 8.6*  GFRNONAA >60 >60 >60  GFRAA >60 >60 >60  PROT 6.5 6.7 6.7  ALBUMIN 3.7 3.7 3.7  AST _2 ALT _3 ALKPHOS 117 139* 163*  BILITOT  0.7 0.8 0.6   Iron/TIBC/Ferritin/ %Sat    Component Value Date/Time   IRON 41 (L) 11/21/2018 0856   TIBC 260 11/21/2018 0856   FERRITIN 150 11/21/2018 0856   IRONPCTSAT 16 (L) 11/21/2018 0856    RADIOGRAPHIC STUDIES: I have personally reviewed the radiological images as listed and agreed with the findings in the report.  CT Chest W Contrast  Result Date: 07/09/2019 CLINICAL DATA:  RIGHT lung cancer diagnosed 2019. Chemotherapy complete. Currently on immunotherapy. Short of breath. EXAM: CT CHEST WITH CONTRAST TECHNIQUE: Multidetector CT imaging of the chest was performed during intravenous contrast administration. CONTRAST:  43m OMNIPAQUE IOHEXOL 300 MG/ML  SOLN COMPARISON:  CT 04/23/2019 FINDINGS: Cardiovascular: Coronary artery calcification and aortic atherosclerotic calcification. Port in the anterior chest wall with tip in distal SVC. Mediastinum/Nodes: No axillary supraclavicular adenopathy. No mediastinal hilar adenopathy. No pericardial effusion. Lungs/Pleura: Mild centrilobular emphysema in the upper lobes. Peribronchial thickening in the RIGHT lower lobe at radiation therapy site. No new or suspicious nodularity. Several scattered calcified granulomas in lung noted. Upper Abdomen: Limited view of the liver, kidneys, pancreas are unremarkable. Normal adrenal glands. Musculoskeletal: No aggressive osseous lesion IMPRESSION: 1. Stable post radiation change in the RIGHT lower lobe. 2. No evidence of new or progressive lung carcinoma. 3. Coronary artery calcification and Aortic Atherosclerosis (ICD10-I70.0). Electronically Signed   By: SSuzy BouchardM.D.   On: 07/09/2019 11:31     ASSESSMENT & PLAN:  1. Malignant ne[place] of [place] lobe of right lung ([place]  2. Encounter for antineoplastic immunotherapy   3. Chronic obstructive pulmonary disease, unspecified COPD type ([place])   4. Elevated alkaline phosphatase level   Cancer Staging Malignant neoplasm of lower lobe of right lung [facility])  Staging form: Lung, AJCC 8th Edition - Clinical stage from 05/13/2018: Stage IIIA (cT1c, cN2, cM0) - Signed by Earlie Server, MD on 05/13/2018  #Stage IIIA lung adenocarcinoma Labs were reviewed and discussed with patient Proceed with durvalumab treatment today.  Clinically he tolerates maintenance immunotherapy well.  #Elevated alkaline phosphatase, etiology unknown.   Likely due to chemotherapy  Continue close monitor.  He may need immunotherapy interruption if alkaline phosphatase continues to trend up  #COPD, continue follow-up with pulmonology.  Continue Anoro.  He has also made appointment with cardiologist for evaluation of shortness of breath with exertion. Folate deficiency, folate level was checked today and level has normalized.  Vitamin B12 is pending.  Follow-up in 2 weeks evaluation prior to next cycle of durvalumab treatments.   Earlie Server, MD, PhD 07/28/19

## 2019-07-28 NOTE — Progress Notes (Signed)
Patient here for follow up. No concerns voiced.  °

## 2019-08-04 NOTE — Progress Notes (Signed)

## 2019-08-11 ENCOUNTER — Inpatient Hospital Stay (HOSPITAL_BASED_OUTPATIENT_CLINIC_OR_DEPARTMENT_OTHER): Payer: Medicare Other | Admitting: Oncology

## 2019-08-11 ENCOUNTER — Inpatient Hospital Stay: Payer: Medicare Other

## 2019-08-11 ENCOUNTER — Other Ambulatory Visit: Payer: Self-pay

## 2019-08-11 ENCOUNTER — Encounter: Payer: Self-pay | Admitting: Oncology

## 2019-08-11 VITALS — BP 124/80 | HR 68 | Temp 96.8°F | Resp 18 | Wt 161.9 lb

## 2019-08-11 DIAGNOSIS — Z5112 Encounter for antineoplastic immunotherapy: Secondary | ICD-10-CM | POA: Diagnosis not present

## 2019-08-11 DIAGNOSIS — C3431 Malignant neoplasm of lower lobe, right bronchus or lung: Secondary | ICD-10-CM | POA: Diagnosis not present

## 2019-08-11 DIAGNOSIS — R748 Abnormal levels of other serum enzymes: Secondary | ICD-10-CM

## 2019-08-11 DIAGNOSIS — J449 Chronic obstructive pulmonary disease, unspecified: Secondary | ICD-10-CM

## 2019-08-11 LAB — CBC WITH DIFFERENTIAL/PLATELET
Abs Immature Granulocytes: 0.02 10*3/uL (ref 0.00–0.07)
Basophils Absolute: 0 10*3/uL (ref 0.0–0.1)
Basophils Relative: 1 %
Eosinophils Absolute: 0.1 10*3/uL (ref 0.0–0.5)
Eosinophils Relative: 2 %
HCT: 41.3 % (ref 39.0–52.0)
Hemoglobin: 13.9 g/dL (ref 13.0–17.0)
Immature Granulocytes: 0 %
Lymphocytes Relative: 16 %
Lymphs Abs: 0.9 10*3/uL (ref 0.7–4.0)
MCH: 31.3 pg (ref 26.0–34.0)
MCHC: 33.7 g/dL (ref 30.0–36.0)
MCV: 93 fL (ref 80.0–100.0)
Monocytes Absolute: 0.6 10*3/uL (ref 0.1–1.0)
Monocytes Relative: 11 %
Neutro Abs: 4 10*3/uL (ref 1.7–7.7)
Neutrophils Relative %: 70 %
Platelets: 183 10*3/uL (ref 150–400)
RBC: 4.44 MIL/uL (ref 4.22–5.81)
RDW: 13 % (ref 11.5–15.5)
WBC: 5.8 10*3/uL (ref 4.0–10.5)
nRBC: 0 % (ref 0.0–0.2)

## 2019-08-11 LAB — COMPREHENSIVE METABOLIC PANEL
ALT: 22 U/L (ref 0–44)
AST: 21 U/L (ref 15–41)
Albumin: 3.7 g/dL (ref 3.5–5.0)
Alkaline Phosphatase: 156 U/L — ABNORMAL HIGH (ref 38–126)
Anion gap: 7 (ref 5–15)
BUN: 7 mg/dL — ABNORMAL LOW (ref 8–23)
CO2: 26 mmol/L (ref 22–32)
Calcium: 8.7 mg/dL — ABNORMAL LOW (ref 8.9–10.3)
Chloride: 106 mmol/L (ref 98–111)
Creatinine, Ser: 1.11 mg/dL (ref 0.61–1.24)
GFR calc Af Amer: >60 mL/min (ref >60)
GFR calc non Af Amer: >60 mL/min (ref >60)
Glucose, Bld: 105 mg/dL — ABNORMAL HIGH (ref 70–99)
Potassium: 3.9 mmol/L (ref 3.5–5.1)
Sodium: 139 mmol/L (ref 135–145)
Total Bilirubin: 0.7 mg/dL (ref 0.3–1.2)
Total Protein: 6.6 g/dL (ref 6.5–8.1)

## 2019-08-11 MED ORDER — HEPARIN SOD (PORK) LOCK FLUSH 100 UNIT/ML IV SOLN
500.0000 [IU] | Freq: Once | INTRAVENOUS | Status: AC | PRN
  Administered 2019-08-11: 500 [IU]
  Filled 2019-08-11: qty 5

## 2019-08-11 MED ORDER — CARVEDILOL 6.25 MG PO TABS: 6.2500 mg | ORAL_TABLET | Freq: Two times a day (BID) | ORAL | 0 refills | Status: DC

## 2019-08-11 MED ORDER — SODIUM CHLORIDE 0.9 % IV SOLN
Freq: Once | INTRAVENOUS | Status: AC
  Filled 2019-08-11: qty 250

## 2019-08-11 MED ORDER — SODIUM CHLORIDE 0.9 % IV SOLN
10.0000 mg/kg | Freq: Once | INTRAVENOUS | Status: AC
  Administered 2019-08-11: 740 mg via INTRAVENOUS
  Filled 2019-08-11: qty 10

## 2019-08-11 MED ORDER — HEPARIN SOD (PORK) LOCK FLUSH 100 UNIT/ML IV SOLN
INTRAVENOUS | Status: AC
  Filled 2019-08-11: qty 5

## 2019-08-11 MED ORDER — SODIUM CHLORIDE 0.9% FLUSH
10.0000 mL | Freq: Once | INTRAVENOUS | Status: AC
  Administered 2019-08-11: 10 mL via INTRAVENOUS
  Filled 2019-08-11: qty 10

## 2019-08-11 NOTE — Progress Notes (Signed)
Patient here for follow up. No concerns voiced.  °

## 2019-08-11 NOTE — Progress Notes (Signed)
Hematology/Oncology follow up note [facility] [phone] [phone]   Patient Care Team: Baxter Hire, MD as PCP - General (Internal Medicine) Sherren Mocha, MD as PCP - Cardiology (Cardiology) Telford Nab, RN as Registered Nurse  REFERRING PROVIDER: Dr.Fleming REASON FOR VISIT:  Follow-up for management of stage III non-small cell lung cancer   HISTORY OF PRESENTING ILLNESS:  Timothy Yoder is a  64 y.o.  male with PMH listed below who was referred to me for evaluation of lung mass.  Patient is a former smoker quit smoking 7 years ago.  40-pack-year. Patient chest lung cancer screening on 03/11/2018. CT scan showed 2.4 cm right lower lobe nodule with a new necrotic.  Subcarinal lymph node. PET scan showed hypermetabolic right lower pulmonary nodule, consistent with primary bronchogenic carcinoma.  Fluid density subcarinal structure demonstrate no significant hypermetabolism.  Although this could represent an incidental benign lesion such as a cyst given the interval development since 02/24/2017, necrotic adenopathy cannot be excluded and tissue sampling should be considered.  # underwent biopsy via bronchoscopy.  Subcarinal lymph node biopsy positive for non-small cell lung cancer, favoring poorly differentiated adenocarcinoma.  Stage IIIA T1c N2M0 #Mediport was placed by Dr. Genevive Bi  # #Elevated alkaline phosphatase. Patient has been evaluated by gastroenterology.  Liver biopsy showed mild sinusoidal dilation and congestion.  Minimal changes suggestive of hepatic process. Iron stain demonstrate minimal focal hepatocellular iron. I discussed with Dr.Wohl via secure chat about patient's pathology result.  No restriction of proceeding with immunotherapy.  Continue watch for the labs.   # Cancer Treatment 04/26/2018 -06/11/2018 Concurrent Chemotherapy [weekly Carbo and Taxol] with RT.  07/10/2018 Started on maintenance Durvalumab.  INTERVAL  HISTORY Timothy Yoder is a 64 y.o. male who has above history reviewed by me today presents for follow up visit for management of stage III non-small cell lung cancer.   Patient has been on maintenance immunotherapy.  He continues to have chronic shortness of breath with exertion.  No changes of his symptoms.  Review of Systems  Constitutional: Positive for fatigue. Negative for chills, diaphoresis, fever and unexpected weight change.  HENT:   Negative for hearing loss, lump/mass, nosebleeds and sore throat.   Eyes: Negative for eye problems and icterus.  Respiratory: Positive for shortness of breath. Negative for chest tightness, cough, hemoptysis and wheezing.   Cardiovascular: Negative for chest pain and leg swelling.  Gastrointestinal: Negative for abdominal distention, abdominal pain, blood in stool, diarrhea, nausea and rectal pain.  Endocrine: Negative for hot flashes.  Genitourinary: Negative for bladder incontinence, difficulty urinating, dysuria, frequency, hematuria and nocturia.   Musculoskeletal: Negative for back pain, flank pain, gait problem and myalgias.  Skin: Negative for rash.  Neurological: Negative for dizziness, gait problem, headaches, numbness and seizures.  Hematological: Negative for adenopathy. Does not bruise/bleed easily.  Psychiatric/Behavioral: Negative for confusion and decreased concentration. The patient is not nervous/anxious.    MEDICAL HISTORY:  Past Medical History:  Diagnosis Date  . BPH (benign prostatic hyperplasia)   . CAD (coronary artery disease)    a. 04/2014 anterolateral STEMI/PCI: LM nl, LAD 53m(2.75x16 Promus DES, D1 50p, LCX nl, RI nl, OM1 100 small/chronic, RCA dominant, [id number](FFR 0.90->Med Rx), RPDA 50, RPLA 70, EF 60%.  . CMarland KitchenPD (chronic obstructive pulmonary disease) (HCC)    Dr FleRaul Del Former tobacco use   . Gastroesophageal reflux disease   . HTN (hypertension)   . Hyperlipidemia   . Hypokalemia   .  Malignant  neoplasm of lung ([place]) 04/01/2018  . Myocardial infarction ([place]) 04/2014   Dr Sherren Mocha  . Stented coronary artery     SURGICAL HISTORY: Past Surgical History:  Procedure Laterality Date  . CARDIAC CATHETERIZATION    . CHOLECYSTECTOMY N/A 02/15/2016   Procedure: LAPAROSCOPIC CHOLECYSTECTOMY WITH INTRAOPERATIVE CHOLANGIOGRAM;  Surgeon: Leonie Green, MD;  Location: [facility] ORS;  Service: General;  Laterality: N/A;  . COLONOSCOPY    . COLONOSCOPY WITH PROPOFOL N/A 08/02/2015   Procedure: COLONOSCOPY WITH PROPOFOL;  Surgeon: Manya Silvas, MD;  Location: [facility] ENDOSCOPY;  Service: Endoscopy;  Laterality: N/A;  . CORONARY ANGIOPLASTY  04/2014   STENT  . ENDOBRONCHIAL ULTRASOUND N/A 03/26/2018   Procedure: ENDOBRONCHIAL ULTRASOUND;  Surgeon: Flora Lipps, MD;  Location: [facility] ORS;  Service: Cardiopulmonary;  Laterality: N/A;  . ESOPHAGOGASTRODUODENOSCOPY (EGD) WITH PROPOFOL N/A 08/02/2015   Procedure: ESOPHAGOGASTRODUODENOSCOPY (EGD) WITH PROPOFOL;  Surgeon: Manya Silvas, MD;  Location: [facility] ENDOSCOPY;  Service: Endoscopy;  Laterality: N/A;  . FLEXIBLE BRONCHOSCOPY N/A 03/26/2018   Procedure: FLEXIBLE BRONCHOSCOPY;  Surgeon: Flora Lipps, MD;  Location: [facility] ORS;  Service: Cardiopulmonary;  Laterality: N/A;  . KNEE ARTHROSCOPY Left   . LEFT HEART CATHETERIZATION WITH CORONARY ANGIOGRAM N/A 05/03/2014   Procedure: LEFT HEART CATHETERIZATION WITH CORONARY ANGIOGRAM;  Surgeon: Blane Ohara, MD;  Location: [facility] CATH LAB;  Service: Cardiovascular;  Laterality: N/A;  . PERCUTANEOUS CORONARY STENT INTERVENTION (PCI-S) N/A 05/05/2014   Procedure: PERCUTANEOUS CORONARY STENT INTERVENTION (PCI-S);  Surgeon: Peter M Martinique, MD;  Location: [facility] CATH LAB;  Service: Cardiovascular;  Laterality: N/A;  . PORTACATH PLACEMENT Left 04/04/2018   Procedure: INSERTION PORT-A-CATH;  Surgeon: [name], MD;  Location: [facility] ORS;  Service: General;  Laterality: Left;    SOCIAL HISTORY: Social History    Socioeconomic History  . Marital status: Married    Spouse name: Not on file  . Number of children: Not on file  . Years of education: Not on file  . Highest education level: Not on file  Occupational History  . Not on file  Tobacco Use  . Smoking status: Former Smoker    Packs/day: 1.00    Years: 40.00    Pack years: 40.00    Types: Cigarettes    Quit date: 02/14/2011    Years since quitting: 8.4  . Smokeless tobacco: Never Used  Substance and Sexual Activity  . Alcohol use: No  . Drug use: No  . Sexual activity: Not on file  Other Topics Concern  . Not on file  Social History Narrative   The patient is married. He lives in [place]. He works full-time. He quit smoking in 2013.   Social Determinants of Health   Financial Resource Strain:   . Difficulty of Paying Living Expenses:   Food Insecurity:   . Worried About Charity fundraiser in the Last Year:   . Arboriculturist in the Last Year:   Transportation Needs:   . Film/video editor (Medical):   Marland Kitchen Lack of Transportation (Non-Medical):   Physical Activity:   . Days of Exercise per Week:   . Minutes of Exercise per Session:   Stress:   . Feeling of Stress :   Social Connections:   . Frequency of Communication with Friends and Family:   . Frequency of Social Gatherings with Friends and Family:   . Attends Religious Services:   . Active Member of Clubs or Organizations:   . Attends Archivist  Meetings:   Marland Kitchen Marital Status:   Intimate Partner Violence:   . Fear of Current or Ex-Partner:   . Emotionally Abused:   Marland Kitchen Physically Abused:   . Sexually Abused:     FAMILY HISTORY: Family History  Problem Relation Age of Onset  . CAD Mother 3       Died of an MI  . CAD Father 75       Died of a massive MI  . COPD Sister   . Lung cancer Brother   . Bone cancer Paternal Uncle   . Colon cancer Neg Hx   . Breast cancer Neg Hx     ALLERGIES:  has No Known Allergies.  MEDICATIONS:  Current  Outpatient Medications  Medication Sig Dispense Refill  . acetaminophen (TYLENOL) 500 MG tablet Take 1,000 mg by mouth every 6 (six) hours as needed (for pain.).    Marland Kitchen albuterol (PROAIR HFA) 108 (90 Base) MCG/ACT inhaler Inhale 2 puffs into the lungs every 6 (six) hours as needed for wheezing or shortness of breath.     [name] ELLIPTA 62.5-25 MCG/INH AEPB INHALE 1 PUFF INTO THE LUNGS DAILY 180 each 0  . aspirin EC 81 MG tablet Take 81 mg by mouth daily.    Marland Kitchen atorvastatin (LIPITOR) 80 MG tablet Take 1 tablet (80 mg total) by mouth daily at 6 PM. Please make yearly appt with Dr. Burt Knack for May for future refills. 1st attempt 90 tablet 0  . carvedilol (COREG) 6.25 MG tablet Take 1 tablet (6.25 mg total) by mouth 2 (two) times daily. 180 tablet 3  . finasteride (PROSCAR) 5 MG tablet Take by mouth.    . lidocaine-prilocaine (EMLA) cream Apply small amount to port site 1 hour prior to [facility] being accessed and cover with plastic wrap 30 g 1  . nitroGLYCERIN (NITROSTAT) 0.4 MG SL tablet Place 1 tablet (0.4 mg total) under the tongue every 5 (five) minutes as needed for chest pain. X 3 doses 25 tablet 3  . omeprazole (PRILOSEC) 40 MG capsule Take 1 capsule (40 mg total) by mouth 2 (two) times daily. 180 capsule 1  . ondansetron (ZOFRAN) 8 MG tablet PLEASE SEE ATTACHED FOR DETAILED DIRECTIONS    . potassium chloride SA (KLOR-CON M20) 20 MEQ tablet Take 1 tablet (20 mEq total) by mouth daily. 90 tablet 3  . sucralfate (CARAFATE) 1 g tablet Take 1 tablet (1 g total) by mouth 3 (three) times daily before meals. Dissolve in warm water, swish and swallow 90 tablet 6  . tamsulosin (FLOMAX) 0.4 MG CAPS capsule Take 0.4 mg by mouth daily after supper.    . traZODone (DESYREL) 50 MG tablet Take 1 tablet (50 mg total) by mouth at bedtime. 90 tablet 0   No current facility-administered medications for this visit.   Facility-Administered Medications Ordered in Other Visits  Medication Dose Route Frequency Provider  Last Rate Last Admin  . durvalumab (IMFINZI) 740 mg in sodium chloride 0.9 % 100 mL chemo infusion  10 mg/kg (Treatment Plan Recorded) Intravenous Once Earlie Server, MD      . heparin lock flush 100 unit/mL  500 Units Intracatheter Once PRN Earlie Server, MD         PHYSICAL EXAMINATION: ECOG PERFORMANCE STATUS: 1 - Symptomatic but completely ambulatory Vitals:   08/11/19 0900  BP: 124/80  Pulse: 68  Resp: 18  Temp: (!) 96.8 F (36 C)  SpO2: 98%   Filed Weights   08/11/19 0900  Weight:  161 lb 14.4 oz (73.4 kg)    Physical Exam Constitutional:      General: He is not in acute distress. HENT:     Head: Normocephalic and atraumatic.  Eyes:     General: No scleral icterus. Cardiovascular:     Rate and Rhythm: Normal rate and regular rhythm.     Heart sounds: Normal heart sounds.  Pulmonary:     Effort: Pulmonary effort is normal. No respiratory distress.     Breath sounds: No wheezing.  Abdominal:     General: Bowel sounds are normal. There is no distension.     Palpations: Abdomen is soft.  Musculoskeletal:        General: No deformity. Normal range of motion.     Cervical back: Normal range of motion and neck supple.  Skin:    General: Skin is warm and dry.     Findings: No erythema or rash.  Neurological:     Mental Status: He is alert and oriented to person, place, and time. Mental status is at baseline.     Cranial Nerves: No cranial nerve deficit.     Coordination: Coordination normal.  Psychiatric:        Mood and Affect: Mood normal.      LABORATORY DATA:  I have reviewed the data as listed Lab Results  Component Value Date   WBC 5.8 08/11/2019   HGB 13.9 08/11/2019   HCT 41.3 08/11/2019   MCV 93.0 08/11/2019   PLT 183 08/11/2019   Recent Labs    07/14/19 0906 07/28/19 0835 08/11/19 0843  NA 138 137 139  K 3.9 3.9 3.9  CL 105 106 106  CO2 _0 GLUCOSE 82 90 105*  BUN 9 9 7*  CREATININE 0.94 0.98 1.11  CALCIUM 8.6* 8.6* 8.7*  GFRNONAA >60  >60 >60  GFRAA >60 >60 >60  PROT 6.7 6.7 6.6  ALBUMIN 3.7 3.7 3.7  AST _1 ALT _2 ALKPHOS 139* 163* 156*  BILITOT 0.8 0.6 0.7   Iron/TIBC/Ferritin/ %Sat    Component Value Date/Time   IRON 41 (L) 11/21/2018 0856   TIBC 260 11/21/2018 0856   FERRITIN 150 11/21/2018 0856   IRONPCTSAT 16 (L) 11/21/2018 0856    RADIOGRAPHIC STUDIES: I have personally reviewed the radiological images as listed and agreed with the findings in the report.  CT Chest W Contrast  Result Date: 07/09/2019 CLINICAL DATA:  RIGHT lung cancer diagnosed 2019. Chemotherapy complete. Currently on immunotherapy. Short of breath. EXAM: CT CHEST WITH CONTRAST TECHNIQUE: Multidetector CT imaging of the chest was performed during intravenous contrast administration. CONTRAST:  54m OMNIPAQUE IOHEXOL 300 MG/ML  SOLN COMPARISON:  CT 04/23/2019 FINDINGS: Cardiovascular: Coronary artery calcification and aortic atherosclerotic calcification. Port in the anterior chest wall with tip in distal SVC. Mediastinum/Nodes: No axillary supraclavicular adenopathy. No mediastinal hilar adenopathy. No pericardial effusion. Lungs/Pleura: Mild centrilobular emphysema in the upper lobes. Peribronchial thickening in the RIGHT lower lobe at radiation therapy site. No new or suspicious nodularity. Several scattered calcified granulomas in lung noted. Upper Abdomen: Limited view of the liver, kidneys, pancreas are unremarkable. Normal adrenal glands. Musculoskeletal: No aggressive osseous lesion IMPRESSION: 1. Stable post radiation change in the RIGHT lower lobe. 2. No evidence of new or progressive lung carcinoma. 3. Coronary artery calcification and Aortic Atherosclerosis (ICD10-I70.0). Electronically Signed   By: SSuzy BouchardM.D.   On: 07/09/2019 11:31     ASSESSMENT & PLAN:  1.  Malignant neoplasm of lower lobe of right lung ([place])   2. Encounter for antineoplastic immunotherapy   3. Chronic obstructive pulmonary disease,  unspecified COPD type ([place])   4. Elevated alkaline phosphatase level   Cancer Staging Malignant neoplasm of lower lobe of right lung [facility]) Staging form: Lung, AJCC 8th Edition - Clinical stage from 05/13/2018: Stage IIIA (cT1c, cN2, cM0) - Signed by Earlie Server, MD on 05/13/2018  #Stage IIIA lung adenocarcinoma Labs reviewed and discussed with patient. Proceed with durvalumab today.Marland Kitchen  #Elevated alkaline phosphatase, etiology likely secondary to immunotherapy.  Labs reviewed and discussed with patient.  Alkaline phosphatase slightly decreased.  Continue close monitor.   Previously he has had GI work-up   #COPD,  Continue Anoro.   Recommend patient to follow-up with pulmonology and cardiology. Folate deficiency, folate level has resolved.  Vitamin B12 is also adequate.  Follow-up in 2 weeks evaluation prior to next cycle of durvalumab treatments.   Earlie Server, MD, PhD 08/11/19

## 2019-08-19 NOTE — Progress Notes (Signed)

## 2019-08-22 ENCOUNTER — Other Ambulatory Visit: Payer: Self-pay | Admitting: Internal Medicine

## 2019-08-22 ENCOUNTER — Other Ambulatory Visit: Payer: Self-pay

## 2019-08-22 ENCOUNTER — Encounter: Payer: Self-pay | Admitting: Internal Medicine

## 2019-08-22 ENCOUNTER — Ambulatory Visit (INDEPENDENT_AMBULATORY_CARE_PROVIDER_SITE_OTHER): Payer: Medicare Other | Admitting: Internal Medicine

## 2019-08-22 ENCOUNTER — Other Ambulatory Visit
Admission: RE | Admit: 2019-08-22 | Discharge: 2019-08-22 | Disposition: A | Discharge status: Home / Self Care | Payer: Medicare Other | Source: Ambulatory Visit | Attending: Internal Medicine | Admitting: Internal Medicine

## 2019-08-22 VITALS — BP 130/84 | HR 67 | Temp 97.7°F | Ht 68.0 in | Wt 163.2 lb

## 2019-08-22 DIAGNOSIS — R0600 Dyspnea, unspecified: Secondary | ICD-10-CM | POA: Diagnosis not present

## 2019-08-22 DIAGNOSIS — J449 Chronic obstructive pulmonary disease, unspecified: Secondary | ICD-10-CM

## 2019-08-22 DIAGNOSIS — R0609 Other forms of dyspnea: Secondary | ICD-10-CM

## 2019-08-22 MED ORDER — SPIRIVA RESPIMAT 2.5 MCG/ACT IN AERS: 2.0000 | INHALATION_SPRAY | Freq: Every day | RESPIRATORY_TRACT | 0 refills | Status: DC

## 2019-08-22 MED ORDER — ALBUTEROL SULFATE HFA 108 (90 BASE) MCG/ACT IN AERS: 2.0000 | INHALATION_SPRAY | Freq: Four times a day (QID) | RESPIRATORY_TRACT | 5 refills | Status: DC | PRN

## 2019-08-22 MED ORDER — SPIRIVA RESPIMAT 2.5 MCG/ACT IN AERS: 2.0000 | INHALATION_SPRAY | Freq: Every day | RESPIRATORY_TRACT | 3 refills | Status: DC

## 2019-08-22 NOTE — Addendum Note (Signed)
Addended by: Hildred Alamin I on: 08/22/2019 12:30 PM   Modules accepted: Orders

## 2019-08-22 NOTE — Progress Notes (Signed)
ient ID: Timothy Yoder, male    DOB: 04/23/1956, 64 y.o.   MRN: [id number]  Chief Complaint  Patient presents with  . 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 New 2.4 cm right lower lobe nodule with a new necrotic appearing subcarinal lymph node. Lung-RADS 4B, suspicious. Additional imaging evaluation or consultation with Pulmonology or Thoracic Surgery recommended. These results will be called to the ordering clinician or representative by the Radiologist Assistant, and communication documented in the PACS or zVision Dashboard. 2. Aortic atherosclerosis (ICD10-170.0). Coronary arterycalcification.3. Emphysema (ICD10-J43.9).  03/13/2018 PET scan #Hypermetabolic right lower lobe pulmonary nodule, consistent with primary bronchogenic carcinoma. Fluid density subcarinal structure demonstrates no significant hypermetabolism. Although this could represent an incidental benign lesion such as a bronchogenic cyst, given interval development since 02/24/2017, necrotic adenopathy cannot be excluded and tissue sampling should be considered.  03/04/2019 Follow up : COPD , Lung Cancer  Patient returns for a 1 year follow-up.  Patient has underlying moderate COPD is on Anoro daily.  Says overall is doing okay .  Trying to be more active. Activity level is improving . Is doing light yard work .  Weight is improving . Appetite is improved.  Tolerating immunotherapy currently .  No flare of cough or wheezing . No hemoptysis .  Gets winded with heavy activity . Rare albuterol use .   Retired.   Patient was diagnosed with adenocarcinoma of the lung in December 2019.  He underwent concurrent chemotherapy in January and  February 2020 .  Completed XRT.  Started on durvalumab March 2020 every other week.    Serial CT chest most recent January 20, 2019 showed radiation changes in the medial right lower lobe improved no findings specific for recurrent or metastatic disease.  OV 08/22/2019  Subjective:  Patient ID: Timothy Yoder, male , DOB: 23-Oct-1955 , age 64 y.o. , MRN: [id number] , ADDRESS: [address] [place] [facility] [address]   08/22/2019 -   Chief Complaint  Patient presents with  . Follow-up    pt states walking upstairs breathing issues.pt uses recuse inhaler 2 times a week. Uses regular inhalers daily   Follow-up gold stage II COPD based on FEV1 per chart review 53% not otherwise specified  HPI Timothy Yoder 64 y.o. -patient of Timothy Yoder presents to the [facility] clinic for COPD follow-up.  Being seen on standby basis by myself Dr. Chase Yoder.  Patient tells me that he has had few year diagnosis of COPD.  In 2016 he had a heart attack.  After that he had a cardiac stress test that was normal.  He says last pulmonary function test was several years ago.  For the last few to several years he has been on Anoro.  In 2019/2020 he was diagnosed with lung cancer and is status post radiation.  Corresponding to this time interval he is now more short of breath with exertion.  A few years ago he could walk 3 flights of stairs without stopping.  Now he can only walk 2 flights of stairs.  There is no associated chest pain.  No orthopnea no proximal nocturnal dyspnea.  His COPD CAT score is listed below.  He  endorses a family history of COPD with a sister having had COPD.  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  March 2021 CT chest is listed below.  CAT COPD Symptom & Quality of Life Score (GSK trademark) 0 is no burden. 5 is highest burden 08/22/2019   Never Cough -> Cough all the time 2  No phlegm in chest -> Chest is full of phlegm 2  No chest tightness -> Chest feels very tight 1  No dyspnea for  1 flight stairs/hill -> Very dyspneic for 1 flight of stairs 4  No limitations for ADL at home -> Very limited with ADL at home 3  Confident leaving home -> Not at all confident leaving home 1  Sleep soundly -> Do not sleep soundly because of lung condition 1  Lots of Energy -> No energy at all 3  TOTAL Score (max 40)  17   CT chest march 2021  Lungs/Pleura: Mild centrilobular emphysema in the upper lobes. Peribronchial thickening in the RIGHT lower lobe at radiation therapy site. No new or suspicious nodularity. Several scattered calcified granulomas in lung noted.  Upper Abdomen: Limited view of the liver, kidneys, pancreas are unremarkable. Normal adrenal glands.  Musculoskeletal: No aggressive osseous lesion  IMPRESSION: 1. Stable post radiation change in the RIGHT lower lobe. 2. No evidence of new or progressive lung carcinoma. 3. Coronary artery calcification and Aortic Atherosclerosis (ICD10-I70.0).   Electronically Signed   By: Timothy Yoder M.D.   On: 07/09/2019 11:31   Results for Timothy Yoder" (MRN [id number]) as of 08/22/2019 11:59  Ref. Range 08/11/2019 08:43  Hemoglobin Latest Ref Range: 13.0 - 17.0 g/dL 13.9  Results for Timothy Yoder" (MRN [id number]) as of 08/22/2019 11:59  Ref. Range 08/11/2019 08:43  Creatinine Latest Ref Range: 0.61 - 1.24 mg/dL 1.11    ROS - per HPI     has a past medical history of BPH (benign prostatic hyperplasia), CAD (coronary artery disease), COPD (chronic obstructive pulmonary disease) ([place]), Former tobacco use, Gastroesophageal reflux disease, HTN (hypertension), Hyperlipidemia, Hypokalemia, Malignant neoplasm of lung ([place]) (04/01/2018), Myocardial infarction ([place]) (04/2014), and Stented coronary artery.   reports that he quit smoking about 8 years ago. His smoking use included cigarettes. He has a 40.00 pack-year smoking history. He has never used smokeless tobacco.  Past Surgical History:  Procedure  Laterality Date  . CARDIAC CATHETERIZATION    . CHOLECYSTECTOMY N/A 02/15/2016   Procedure: LAPAROSCOPIC CHOLECYSTECTOMY WITH INTRAOPERATIVE CHOLANGIOGRAM;  Surgeon: Leonie Green, MD;  Location: [facility] ORS;  Service: General;  Laterality: N/A;  . COLONOSCOPY    . COLONOSCOPY WITH PROPOFOL N/A 08/02/2015   Procedure: COLONOSCOPY WITH PROPOFOL;  Surgeon: Manya Silvas, MD;  Location[facility] ENDOSCOPY;  Service: Endoscopy;  Laterality: N/A;  . CORONARY ANGIOPLASTY  04/2014   STENT  . ENDOBRONCHIAL ULTRASOUND N/A 03/26/2018   Procedure: ENDOBRONCHIAL ULTRASOUND;  Surgeon: Flora Lipps, MD;  Location: [facility] ORS;  Service: Cardiopulmonary;  Laterality: N/A;  . ESOPHAGOGASTRODUODENOSCOPY (EGD) WITH PROPOFOL N/A 08/02/2015   Procedure: ESOPHAGOGASTRODUODENOSCOPY (EGD) WITH PROPOFOL;  Surgeon: Manya Silvas, MD;  Location[facility] ENDOSCOPY;  Service: Endoscopy;  Laterality: N/A;  . FLEXIBLE BRONCHOSCOPY N/A 03/26/2018   Procedure: FLEXIBLE BRONCHOSCOPY;  Surgeon: Flora Lipps, MD;  Location: [facility] ORS;  Service: Cardiopulmonary;  Laterality: N/A;  . KNEE ARTHROSCOPY Left   . LEFT HEART CATHETERIZATION WITH CORONARY ANGIOGRAM N/A 05/03/2014   Procedure: LEFT HEART CATHETERIZATION WITH CORONARY ANGIOGRAM;  Surgeon: Blane Ohara, MD;  Location: [facility] CATH LAB;  Service: Cardiovascular;  Laterality: N/A;  . PERCUTANEOUS CORONARY STENT INTERVENTION (PCI-S) N/A 05/05/2014   Procedure: PERCUTANEOUS CORONARY STENT INTERVENTION (PCI-S);  Surgeon: Peter M Martinique, MD;  Location: [facility] CATH LAB;  Service: Cardiovascular;  Laterality: N/A;  . PORTACATH PLACEMENT Left 04/04/2018   Procedure: INSERTION PORT-A-CATH;  Surgeon: [name], MD;  Location: [facility] ORS;  Service: General;  Laterality: Left;    No Known Allergies  Immunization History  Administered Date(s) Administered  . Influenza,inj,Quad PF,6+ Mos 01/27/2019  . Influenza-Unspecified 02/26/2018  . PFIZER SARS-COV-2 Vaccination 06/05/2019, 07/01/2019      Family History  Problem Relation Age of Onset  . CAD Mother 49       Died of an MI  . CAD Father 79       Died of a massive MI  . COPD Sister   . Lung cancer Brother   . Bone cancer Paternal Uncle   . Colon cancer Neg Hx   . Breast cancer Neg Hx      Current Outpatient Medications:  .  acetaminophen (TYLENOL) 500 MG tablet, Take 1,000 mg by mouth every 6 (six) hours as needed (for pain.)., Disp: , Rfl:  .  albuterol (PROAIR HFA) 108 (90 Base) MCG/ACT inhaler, Inhale 2 puffs into the lungs every 6 (six) hours as needed for wheezing or shortness of breath. , Disp: , Rfl:  .  ANORO ELLIPTA 62.5-25 MCG/INH AEPB, INHALE 1 PUFF INTO THE LUNGS DAILY, Disp: 180 each, Rfl: 0 .  aspirin EC 81 MG tablet, Take 81 mg by mouth daily., Disp: , Rfl:  .  atorvastatin (LIPITOR) 80 MG tablet, Take 1 tablet (80 mg total) by mouth daily at 6 PM. Please make yearly appt with Dr. Burt Knack for May for future refills. 1st attempt, Disp: 90 tablet, Rfl: 0 .  carvedilol (COREG) 6.25 MG tablet, Take 1 tablet (6.25 mg total) by mouth 2 (two) times daily. Please keep upcoming appt in May with Dr. Burt Knack before anymore refills. Thank you, Disp: 180 tablet, Rfl: 0 .  finasteride (PROSCAR) 5 MG tablet, Take by mouth., Disp: , Rfl:  .  lidocaine-prilocaine (EMLA) cream, Apply small amount to port site 1 hour prior to [place] being accessed and cover with plastic wrap, Disp: 30 g, Rfl: 1 .  nitroGLYCERIN (NITROSTAT) 0.4 MG SL tablet, Place 1 tablet (0.4 mg total) under the tongue every 5 (five) minutes as needed for chest pain. X 3 doses, Disp: 25 tablet, Rfl: 3 .  omeprazole (PRILOSEC) 40 MG capsule, Take 1 capsule (40 mg total) by mouth 2 (two) times daily., Disp: 180 capsule, Rfl: 1 .  ondansetron (ZOFRAN) 8 MG tablet, PLEASE SEE ATTACHED FOR DETAILED DIRECTIONS, Disp: , Rfl:  .  potassium chloride SA (KLOR-CON M20) 20 MEQ tablet, Take 1 tablet (20 mEq total) by mouth daily., Disp: 90 tablet, Rfl: 3 .  sucralfate  (CARAFATE) 1 g tablet, Take 1 tablet (1 g total) by mouth 3 (three) times daily before meals. Dissolve in warm water, swish and swallow, Disp: 90 tablet, Rfl: 6 .  tamsulosin (FLOMAX) 0.4 MG CAPS capsule, Take 0.4 mg by mouth daily after supper., Disp: , Rfl:  .  traZODone (DESYREL) 50 MG tablet, Take 1 tablet (50 mg total) by mouth at bedtime., Disp: 90 tablet, Rfl: 0      Objective:   Vitals:   08/22/19 1124  BP: 130/84  Pulse: 67  Temp: 97.7 F (36.5  C)  TempSrc: Temporal  SpO2: 97%  Weight: 163 lb 3.2 oz (74 kg)  Height: _0  (1.727 m)    Estimated body mass index is 24.81 kg/m as calculated from the following:   Height as of this encounter: _1  (1.727 m).   Weight as of this encounter: 163 lb 3.2 oz (74 kg).  _2 @  [employer]   08/22/19 1124  Weight: 163 lb 3.2 oz (74 kg)     Physical Exam  General Appearance:    Alert, cooperative, no distress, appears stated age - yes , Deconditioned looking - no , OBESE  - no, Sitting on Wheelchair -  no  Head:    Normocephalic, without obvious abnormality, atraumatic  Eyes:    PERRL, conjunctiva/corneas clear,  Ears:    Normal TM's and external ear canals, both ears  Nose:   Nares normal, septum midline, mucosa normal, no drainage    or sinus tenderness. OXYGEN ON  - no . Patient is @ ra   Throat:   Lips, mucosa, and tongue normal; teeth and gums normal. Cyanosis on lips - no  Neck:   Supple, symmetrical, trachea midline, no adenopathy;    thyroid:  no enlargement/tenderness/nodules; no carotid   bruit or JVD  Back:     Symmetric, no curvature, ROM normal, no CVA tenderness  Lungs:     Distress - no , Wheeze no, Barrell Chest - no, Purse lip breathing - no, Crackles - no   Chest Wall:    No tenderness or deformity.    Heart:    Regular rate and rhythm, S1 and S2 normal, no rub   or gallop, Murmur - no  Breast Exam:    NOT DONE  Abdomen:     Soft, non-tender, bowel sounds active all four quadrants,    no  masses, no organomegaly. Visceral obesity - no  Genitalia:   NOT DONE  Rectal:   NOT DONE  Extremities:   Extremities - normal, Has Cane - no, Clubbing - no, Edema - no  Pulses:   2+ and symmetric all extremities  Skin:   Stigmata of Connective Tissue Disease - n  Lymph nodes:   Cervical, supraclavicular, and axillary nodes normal  Psychiatric:  Neurologic:   Pleasant - yes, Anxious - no, Flat affect - no  CAm-ICU - neg, Alert and Oriented x 3 - yes, Moves all 4s - yes, Speech - normal, Cognition - intact           Assessment:       ICD-10-CM   1. Stage 2 moderate COPD by GOLD classification (HCC)  J44.9   2. Dyspnea on exertion  R06.00        Plan:     Patient Instructions     ICD-10-CM   1. Stage 2 moderate COPD by GOLD classification ([place])  J44.9   2. Dyspnea on exertion  R06.00     Unclear reason for decline in shortness of breath but could be multifactorial  Plan -Please talk to your cardiologist and make sure your heart pump and blood vessels in the heart are functioning fine -Stop Anoro =-Start higher dose Spiriva Respimat 2 puffs once daily scheduled -Continue albuterol as needed  -Check alpha-1 antitrypsin phenotype given family history of COPD -Do full pulmonary function test in the next few weeks  Follow-up -Return within 4 weeks -for follow-up and assessment of improvement  = can see NP  -Depending on the PFT results might have to  escalate inhaler therapy  -COPD CAT score at follow-up  -Simple walking desaturation test at follow-up     SIGNATURE    Dr. Brand Males, M.D., F.C.C.P,  Pulmonary and Critical Care Medicine Staff Physician, [place] Director - Interstitial Lung Disease  Program  Pulmonary [place] at [place], [place], [address]  Pager: [phone], If no answer or between  15:00h - 7:00h: call [phone] Telephone: [phone]  12:18 PM 08/22/2019

## 2019-08-22 NOTE — Patient Instructions (Addendum)
ICD-10-CM   1. Stage 2 moderate COPD by GOLD classification ([place])  J44.9   2. Dyspnea on exertion  R06.00     Unclear reason for decline in shortness of breath but could be multifactorial  Plan -Please talk to your cardiologist and make sure your heart pump and blood vessels in the heart are functioning fine -Stop Anoro =-Start higher dose Spiriva Respimat 2 puffs once daily scheduled -Continue albuterol as needed  -Check alpha-1 antitrypsin phenotype given family history of COPD -Do full pulmonary function test in the next few weeks  Follow-up -Return within 4 weeks -for follow-up and assessment of improvement  = can see NP  -Depending on the PFT results might have to escalate inhaler therapy  -COPD CAT score at follow-up  -Simple walking desaturation test at follow-up

## 2019-08-22 NOTE — Addendum Note (Signed)
Addended by: Santiago Bur on: 08/22/2019 12:48 PM   Modules accepted: Orders

## 2019-08-25 ENCOUNTER — Ambulatory Visit (INDEPENDENT_AMBULATORY_CARE_PROVIDER_SITE_OTHER): Payer: Medicare Other | Admitting: Cardiovascular Disease

## 2019-08-25 ENCOUNTER — Encounter: Payer: Self-pay | Admitting: Oncology

## 2019-08-25 ENCOUNTER — Ambulatory Visit: Payer: Medicare Other

## 2019-08-25 ENCOUNTER — Other Ambulatory Visit: Payer: Self-pay

## 2019-08-25 ENCOUNTER — Encounter: Payer: Self-pay | Admitting: Cardiovascular Disease

## 2019-08-25 ENCOUNTER — Other Ambulatory Visit: Payer: Medicare Other

## 2019-08-25 ENCOUNTER — Ambulatory Visit: Payer: Medicare Other | Admitting: Oncology

## 2019-08-25 VITALS — BP 142/82 | HR 77 | Ht 68.0 in | Wt 163.0 lb

## 2019-08-25 DIAGNOSIS — I10 Essential (primary) hypertension: Secondary | ICD-10-CM

## 2019-08-25 DIAGNOSIS — I25119 Atherosclerotic heart disease of native coronary artery with unspecified angina pectoris: Secondary | ICD-10-CM | POA: Diagnosis not present

## 2019-08-25 DIAGNOSIS — E782 Mixed hyperlipidemia: Secondary | ICD-10-CM

## 2019-08-25 DIAGNOSIS — R0602 Shortness of breath: Secondary | ICD-10-CM | POA: Diagnosis not present

## 2019-08-25 DIAGNOSIS — J449 Chronic obstructive pulmonary disease, unspecified: Secondary | ICD-10-CM

## 2019-08-25 MED ORDER — INCRUSE ELLIPTA 62.5 MCG/INH IN AEPB: 1.0000 | INHALATION_SPRAY | Freq: Every day | RESPIRATORY_TRACT | 5 refills | Status: DC

## 2019-08-25 NOTE — Progress Notes (Signed)
Patient contacted for follow up visit. No new concerns voiced.

## 2019-08-25 NOTE — Progress Notes (Signed)
Cardiology Office Note:    Date:  08/26/2019   ID:  Timothy Yoder, DOB 12/18/1955, MRN [id number]  PCP:  Baxter Hire, MD  Cardiologist:  Sherren Mocha, MD  Electrophysiologist:  None   Referring MD: Baxter Hire, MD   Chief Complaint  Patient presents with  . Coronary Artery Disease    History of Present Illness:    Timothy Yoder is a 64 y.o. male with a hx of coronary artery disease, initially presenting with an anterolateral STEMI in 2016 treated with a drug-eluting stent to the mid LAD.  LVEF at that time was 60%. He was noted to have moderate stenosis of the right coronary artery evaluated by pressure wire with recommendations for medical management.  The patient has non-small cell lung cancer and he is followed closely by his oncologist.  When he was seen in a telehealth evaluation by Richardson Dopp 1 year ago, he was felt to be clinically stable from a cardiac perspective and no medication changes were made.  The patient is here alone today. His cancer has been clinically stable with no evidence of progressive disease. He was recently seen by Dr Chase Caller for worsening shortness of breath with activity and further pulmonary testing is planned. Cardiology evaluation was recommended as well. The patient has had some chest pains and has taken NTG, but he states the pains have been transient and they don't feel anything like the symptoms he had at the time of his MI.   Past Medical History:  Diagnosis Date  . BPH (benign prostatic hyperplasia)   . CAD (coronary artery disease)    a. 04/2014 anterolateral STEMI/PCI: LM nl, LAD 48m(2.75x16 Promus DES, D1 50p, LCX nl, RI nl, OM1 100 small/chronic, RCA dominant, [id number](FFR 0.90->Med Rx), RPDA 50, RPLA 70, EF 60%.  . CMarland KitchenPD (chronic obstructive pulmonary disease) (HCC)    Dr FleRaul Del Former tobacco use   . Gastroesophageal reflux disease   . HTN (hypertension)   . Hyperlipidemia   . Hypokalemia   . Malignant ne[place] of  lung ([place]/12[place]2017  . Myocardial infarction ([facility]1/2016   Dr MicSherren Mocha Stented coronary artery     Past Surgical History:  Procedure Laterality Date  . CARDIAC CATHETERIZATION    . CHOLECYSTECTOMY N/A 02/15/2016   Procedure: LAPAROSCOPIC CHOLECYSTECTOMY WITH INTRAOPERATIVE CHOLANGIOGRAM;  Surgeon: JarLeonie GreenD;  Location: [facility] ORS;  Service: General;  Laterality: N/A;  . COLONOSCOPY    . COLONOSCOPY WITH PROPOFOL N/A 08/02/2015   Procedure: COLONOSCOPY WITH PROPOFOL;  Surgeon: RobManya SilvasD;  [facility];  Service: Endoscopy;  Laterality: N/A;  . CORONARY ANGIOPLASTY  04/2014   STENT  . ENDOBRONCHIAL ULTRASOUND N/A 03/26/2018   Procedure: ENDOBRONCHIAL ULTRASOUND;  Surgeon: KasFlora LippsD;  Location: [facility] ORS;  Service: Cardiopulmonary;  Laterality: N/A;  . ESOPHAGOGASTRODUODENOSCOPY (EGD) WITH PROPOFOL N/A 08/02/2015   Procedure: ESOPHAGOGASTRODUODENOSCOPY (EGD) WITH PROPOFOL;  Surgeon: RobManya SilvasD;  [facility];  Service: Endoscopy;  Laterality: N/A;  . FLEXIBLE BRONCHOSCOPY N/A 03/26/2018   Procedure: FLEXIBLE BRONCHOSCOPY;  Surgeon: KasFlora LippsD;  Location: [facility] ORS;  Service: Cardiopulmonary;  Laterality: N/A;  . KNEE ARTHROSCOPY Left   . LEFT HEART CATHETERIZATION WITH CORONARY ANGIOGRAM N/A 05/03/2014   Procedure: LEFT HEART CATHETERIZATION WITH CORONARY ANGIOGRAM;  Surgeon: MicBlane OharaD;  [facility];  Service: Cardiovascular;  Laterality: N/A;  . PERCUTANEOUS CORONARY STENT INTERVENTION (PCI-S) N/A 05/05/2014  Procedure: PERCUTANEOUS CORONARY STENT INTERVENTION (PCI-S);  Surgeon: Peter M Martinique, MD;  [facility] CATH LAB;  Service: Cardiovascular;  Laterality: N/A;  . PORTACATH PLACEMENT Left 04/04/2018   Procedure: INSERTION PORT-A-CATH;  Surgeon: [name], MD;  Location: [facility] ORS;  Service: General;  Laterality: Left;    Current Medications: Current Meds  Medication Sig  .  acetaminophen (TYLENOL) 500 MG tablet Take 1,000 mg by mouth every 6 (six) hours as needed (for pain.).  Marland Kitchen albuterol (PROAIR HFA) 108 (90 Base) MCG/ACT inhaler Inhale 2 puffs into the lungs every 6 (six) hours as needed for wheezing or shortness of breath.  Marland Kitchen aspirin EC 81 MG tablet Take 81 mg by mouth daily.  Marland Kitchen atorvastatin (LIPITOR) 80 MG tablet Take 1 tablet (80 mg total) by mouth daily at 6 PM. Please make yearly appt with Dr. Burt Knack for May for future refills. 1st attempt  . carvedilol (COREG) 6.25 MG tablet Take 1 tablet (6.25 mg total) by mouth 2 (two) times daily. Please keep upcoming appt in May with Dr. Burt Knack before anymore refills. Thank you  . finasteride (PROSCAR) 5 MG tablet Take by mouth.  . lidocaine-prilocaine (EMLA) cream Apply small amount to port site 1 hour prior to [facility] being accessed and cover with plastic wrap  . nitroGLYCERIN (NITROSTAT) 0.4 MG SL tablet Place 1 tablet (0.4 mg total) under the tongue every 5 (five) minutes as needed for chest pain. X 3 doses  . omeprazole (PRILOSEC) 40 MG capsule Take 1 capsule (40 mg total) by mouth 2 (two) times daily.  . ondansetron (ZOFRAN) 8 MG tablet PLEASE SEE ATTACHED FOR DETAILED DIRECTIONS  . potassium chloride SA (KLOR-CON M20) 20 MEQ tablet Take 1 tablet (20 mEq total) by mouth daily.  . tamsulosin (FLOMAX) 0.4 MG CAPS capsule Take 0.4 mg by mouth daily after supper.  . Tiotropium Bromide Monohydrate (SPIRIVA RESPIMAT) 2.5 MCG/ACT AERS Inhale 2 puffs into the lungs daily.  . traZODone (DESYREL) 50 MG tablet Take 1 tablet (50 mg total) by mouth at bedtime.  Marland Kitchen umeclidinium bromide (INCRUSE ELLIPTA) 62.5 MCG/INH AEPB Inhale 1 puff into the lungs daily.     Allergies:   Patient has no known allergies.   Social History   Socioeconomic History  . Marital status: Married    Spouse name: Not on file  . Number of children: Not on file  . Years of education: Not on file  . Highest education level: Not on file  Occupational  History  . Not on file  Tobacco Use  . Smoking status: Former Smoker    Packs/day: 1.00    Years: 40.00    Pack years: 40.00    Types: Cigarettes    Quit date: 02/14/2011    Years since quitting: 8.5  . Smokeless tobacco: Never Used  Substance and Sexual Activity  . Alcohol use: No  . Drug use: No  . Sexual activity: Not on file  Other Topics Concern  . Not on file  Social History Narrative   The patient is married. He lives in [place]. He works full-time. He quit smoking in 2013.   Social Determinants of Health   Financial Resource Strain:   . Difficulty of Paying Living Expenses:   Food Insecurity:   . Worried About Charity fundraiser in the Last Year:   . Arboriculturist in the Last Year:   Transportation Needs:   . Film/video editor (Medical):   Marland Kitchen Lack of Transportation (Non-Medical):  Physical Activity:   . Days of Exercise per Week:   . Minutes of Exercise per Session:   Stress:   . Feeling of Stress :   Social Connections:   . Frequency of Communication with Friends and Family:   . Frequency of Social Gatherings with Friends and Family:   . Attends Religious Services:   . Active Member of Clubs or Organizations:   . Attends Archivist Meetings:   Marland Kitchen Marital Status:      Family History: The patient's family history includes Bone cancer in his paternal uncle; CAD (age of onset: 59) in his father; CAD (age of onset: 59) in his mother; COPD in his sister; Lung cancer in his brother. There is no history of Colon cancer or Breast cancer.  ROS:   Please see the history of present illness.    All other systems reviewed and are negative.  EKGs/Labs/Other Studies Reviewed:    The following studies were reviewed today: Stress Echo 03/02/2015: Baseline: LV global systolic function was normal.   Peak stress: LV global systolic function was hyperdynamic.   -------------------------------------------------------------------  Stress echo results:    Left ventricular ejection fraction was  normal at rest and with stress. Normal echo stress   EKG:  EKG is ordered today.  The ekg ordered today demonstrates NSR 77 bpm, within normal limits  Recent Labs: 07/28/2019: TSH 1.127 08/11/2019: ALT 22; BUN 7; Creatinine, Ser 1.11; Hemoglobin 13.9; Platelets 183; Potassium 3.9; Sodium 139  Recent Lipid Panel    Component Value Date/Time   CHOL 104 08/12/2015 0827   TRIG 55 08/12/2015 0827   HDL 35 (L) 08/12/2015 0827   CHOLHDL 3.0 08/12/2015 0827   CHOLHDL 3 07/31/2014 0736   VLDL 15.2 07/31/2014 0736   LDLCALC 58 08/12/2015 0827    Physical Exam:    VS:  BP (!) 142/82   Pulse 77   Ht _0  (1.727 m)   Wt 163 lb (73.9 kg)   SpO2 98%   BMI 24.78 kg/m     Wt Readings from Last 3 Encounters:  08/25/19 163 lb (73.9 kg)  08/22/19 163 lb 3.2 oz (74 kg)  08/11/19 161 lb 14.4 oz (73.4 kg)     GEN:  Well nourished, well developed in no acute distress HEENT: Normal NECK: No JVD; No carotid bruits LYMPHATICS: No lymphadenopathy CARDIAC: RRR, no murmurs, rubs, gallops RESPIRATORY:  Clear to auscultation without rales, wheezing or rhonchi  ABDOMEN: Soft, non-tender, non-distended MUSCULOSKELETAL:  No edema; No deformity  SKIN: Warm and dry NEUROLOGIC:  Alert and oriented x 3 PSYCHIATRIC:  Normal affect   ASSESSMENT:    1. Coronary artery disease involving native coronary artery of native heart with angina pectoris ([place])   2. Essential hypertension   3. Mixed hyperlipidemia   4. Shortness of breath    PLAN:    In order of problems listed above:  1. The patient is clinically stable with intermittent use of NTG, but symptoms have been self limited. Would continue current Rx (ASA, atorvastatin, carvedilol) 2. BP mildly elevated today. I have reviewed recent BP readings from the [facility] and the vast majority have been excellent. Would continue same Rx for now. Continue to monitor. 3. LFT's have been in range. Continue high  intensity statin (atorva 80 mg), needs updated Lipid Panel. 4. Exam unremarkable. liekly multifactorial. Pulmonary evaluation in process. Recommend 2D echo to assess LV/RV function and whether there is any valvular disease.    Medication Adjustments/Labs  and Tests Ordered: Current medicines are reviewed at length with the patient today.  Concerns regarding medicines are outlined above.  Orders Placed This Encounter  Procedures  . Lipid panel  . EKG 12-Lead  . ECHOCARDIOGRAM COMPLETE   No orders of the defined types were placed in this encounter.   Patient Instructions  Medication Instructions:  Your provider recommends that you continue on your current medications as directed. Please refer to the Current Medication list given to you today.   *If you need a refill on your cardiac medications before your next appointment, please call your pharmacy*  Lab Work: Please come fasting to your echo as we will check your cholesterol that day. If you have labs (blood work) drawn today and your tests are completely normal, you will receive your results only by: Marland Kitchen MyChart Message (if you have MyChart) OR . A paper copy in the mail If you have any lab test that is abnormal or we need to change your treatment, we will call you to review the results.  Testing/Procedures: Your provider has requested that you have an echocardiogram. Echocardiography is a painless test that uses sound waves to create images of your heart. It provides your doctor with information about the size and shape of your heart and how well your heart's chambers and valves are working. This procedure takes approximately one hour. There are no restrictions for this procedure.     Follow-Up: At [facility], you and your health needs are our priority.  As part of our continuing mission to provide you with exceptional heart care, we have created designated Provider Care Teams.  These Care Teams include your primary Cardiologist  (physician) and Advanced Practice Providers (APPs -  Physician Assistants and Nurse Practitioners) who all work together to provide you with the care you need, when you need it. Your next appointment:   12 month(s) The format for your next appointment:   In Person Provider:   You may see Sherren Mocha, MD or one of the following Advanced Practice Providers on your designated Care Team:    Richardson Dopp, PA-C  Robbie Lis, [place]      Signed, Sherren Mocha, MD  08/26/2019 6:05 AM    [place]

## 2019-08-25 NOTE — Patient Instructions (Signed)
Medication Instructions:  Your provider recommends that you continue on your current medications as directed. Please refer to the Current Medication list given to you today.   *If you need a refill on your cardiac medications before your next appointment, please call your pharmacy*  Lab Work: Please come fasting to your echo as we will check your cholesterol that day. If you have labs (blood work) drawn today and your tests are completely normal, you will receive your results only by: Marland Kitchen MyChart Message (if you have MyChart) OR . A paper copy in the mail If you have any lab test that is abnormal or we need to change your treatment, we will call you to review the results.  Testing/Procedures: Your provider has requested that you have an echocardiogram. Echocardiography is a painless test that uses sound waves to create images of your heart. It provides your doctor with information about the size and shape of your heart and how well your heart's chambers and valves are working. This procedure takes approximately one hour. There are no restrictions for this procedure.     Follow-Up: At [facility], you and your health needs are our priority.  As part of our continuing mission to provide you with exceptional heart care, we have created designated Provider Care Teams.  These Care Teams include your primary Cardiologist (physician) and Advanced Practice Providers (APPs -  Physician Assistants and Nurse Practitioners) who all work together to provide you with the care you need, when you need it. Your next appointment:   12 month(s) The format for your next appointment:   In Person Provider:   You may see Sherren Mocha, MD or one of the following Advanced Practice Providers on your designated Care Team:    Richardson Dopp, PA-C  [place], [place]

## 2019-08-26 ENCOUNTER — Inpatient Hospital Stay (HOSPITAL_BASED_OUTPATIENT_CLINIC_OR_DEPARTMENT_OTHER): Payer: Medicare Other | Admitting: Oncology

## 2019-08-26 ENCOUNTER — Inpatient Hospital Stay: Payer: Medicare Other

## 2019-08-26 ENCOUNTER — Inpatient Hospital Stay: Payer: Medicare Other | Attending: Oncology

## 2019-08-26 ENCOUNTER — Encounter: Payer: Self-pay | Admitting: Cardiovascular Disease

## 2019-08-26 VITALS — BP 129/94 | HR 74 | Resp 18

## 2019-08-26 VITALS — BP 122/81 | HR 81 | Temp 95.4°F | Resp 18 | Wt 162.7 lb

## 2019-08-26 DIAGNOSIS — Z5112 Encounter for antineoplastic immunotherapy: Secondary | ICD-10-CM | POA: Insufficient documentation

## 2019-08-26 DIAGNOSIS — C3431 Malignant neoplasm of lower lobe, right bronchus or lung: Secondary | ICD-10-CM

## 2019-08-26 DIAGNOSIS — R748 Abnormal levels of other serum enzymes: Secondary | ICD-10-CM | POA: Diagnosis not present

## 2019-08-26 DIAGNOSIS — Z79899 Other long term (current) drug therapy: Secondary | ICD-10-CM | POA: Insufficient documentation

## 2019-08-26 DIAGNOSIS — Z95828 Presence of other vascular implants and grafts: Secondary | ICD-10-CM

## 2019-08-26 LAB — CBC WITH DIFFERENTIAL/PLATELET
Abs Immature Granulocytes: 0.03 10*3/uL (ref 0.00–0.07)
Basophils Absolute: 0 10*3/uL (ref 0.0–0.1)
Basophils Relative: 1 %
Eosinophils Absolute: 0.1 10*3/uL (ref 0.0–0.5)
Eosinophils Relative: 2 %
HCT: 43 % (ref 39.0–52.0)
Hemoglobin: 14.5 g/dL (ref 13.0–17.0)
Immature Granulocytes: 1 %
Lymphocytes Relative: 17 %
Lymphs Abs: 0.9 10*3/uL (ref 0.7–4.0)
MCH: 31.7 pg (ref 26.0–34.0)
MCHC: 33.7 g/dL (ref 30.0–36.0)
MCV: 93.9 fL (ref 80.0–100.0)
Monocytes Absolute: 0.6 10*3/uL (ref 0.1–1.0)
Monocytes Relative: 10 %
Neutro Abs: 3.9 10*3/uL (ref 1.7–7.7)
Neutrophils Relative %: 69 %
Platelets: 171 10*3/uL (ref 150–400)
RBC: 4.58 MIL/uL (ref 4.22–5.81)
RDW: 12.8 % (ref 11.5–15.5)
WBC: 5.6 10*3/uL (ref 4.0–10.5)
nRBC: 0 % (ref 0.0–0.2)

## 2019-08-26 LAB — COMPREHENSIVE METABOLIC PANEL
ALT: 21 U/L (ref 0–44)
AST: 20 U/L (ref 15–41)
Albumin: 3.8 g/dL (ref 3.5–5.0)
Alkaline Phosphatase: 144 U/L — ABNORMAL HIGH (ref 38–126)
Anion gap: 7 (ref 5–15)
BUN: 10 mg/dL (ref 8–23)
CO2: 26 mmol/L (ref 22–32)
Calcium: 8.6 mg/dL — ABNORMAL LOW (ref 8.9–10.3)
Chloride: 103 mmol/L (ref 98–111)
Creatinine, Ser: 1.04 mg/dL (ref 0.61–1.24)
GFR calc Af Amer: >60 mL/min (ref >60)
GFR calc non Af Amer: >60 mL/min (ref >60)
Glucose, Bld: 114 mg/dL — ABNORMAL HIGH (ref 70–99)
Potassium: 3.9 mmol/L (ref 3.5–5.1)
Sodium: 136 mmol/L (ref 135–145)
Total Bilirubin: 1 mg/dL (ref 0.3–1.2)
Total Protein: 6.6 g/dL (ref 6.5–8.1)

## 2019-08-26 LAB — ALPHA-1 ANTITRYPSIN PHENOTYPE: A-1 Antitrypsin, Ser: 150 mg/dL (ref 101–187)

## 2019-08-26 MED ORDER — SODIUM CHLORIDE 0.9 % IV SOLN
10.0000 mg/kg | Freq: Once | INTRAVENOUS | Status: AC
  Administered 2019-08-26: 740 mg via INTRAVENOUS
  Filled 2019-08-26: qty 10

## 2019-08-26 MED ORDER — SODIUM CHLORIDE 0.9 % IV SOLN
Freq: Once | INTRAVENOUS | Status: AC
  Filled 2019-08-26: qty 250

## 2019-08-26 MED ORDER — SODIUM CHLORIDE 0.9% FLUSH
10.0000 mL | INTRAVENOUS | Status: DC | PRN
  Administered 2019-08-26 (×2): 10 mL via INTRAVENOUS
  Filled 2019-08-26: qty 10

## 2019-08-26 MED ORDER — HEPARIN SOD (PORK) LOCK FLUSH 100 UNIT/ML IV SOLN
INTRAVENOUS | Status: AC
  Filled 2019-08-26: qty 5

## 2019-08-26 MED ORDER — HEPARIN SOD (PORK) LOCK FLUSH 100 UNIT/ML IV SOLN
500.0000 [IU] | Freq: Once | INTRAVENOUS | Status: AC | PRN
  Administered 2019-08-26: 500 [IU]
  Filled 2019-08-26: qty 5

## 2019-08-26 MED ORDER — SODIUM CHLORIDE 0.9% FLUSH
10.0000 mL | INTRAVENOUS | Status: DC | PRN
  Filled 2019-08-26: qty 10

## 2019-08-26 NOTE — Progress Notes (Signed)
Alpha 1 Is normal MM

## 2019-08-26 NOTE — Progress Notes (Signed)
Hematology/Oncology follow up note [facility] [phone] [phone]   Patient Care Team: Baxter Hire, MD as PCP - General (Internal Medicine) Sherren Mocha, MD as PCP - Cardiology (Cardiology) Telford Nab, RN as Registered Nurse  REFERRING PROVIDER: Dr.Fleming REASON FOR VISIT:  Follow-up for management of stage III non-small cell lung cancer   HISTORY OF PRESENTING ILLNESS:  Timothy Yoder is a  64 y.o.  male with PMH listed below who was referred to me for evaluation of lung mass.  Patient is a former smoker quit smoking 7 years ago.  40-pack-year. Patient chest lung cancer screening on 03/11/2018. CT scan showed 2.4 cm right lower lobe nodule with a new necrotic.  Subcarinal lymph node. PET scan showed hypermetabolic right lower pulmonary nodule, consistent with primary bronchogenic carcinoma.  Fluid density subcarinal structure demonstrate no significant hypermetabolism.  Although this could represent an incidental benign lesion such as a cyst given the interval development since 02/24/2017, necrotic adenopathy cannot be excluded and tissue sampling should be considered.  # underwent biopsy via bronchoscopy.  Subcarinal lymph node biopsy positive for non-small cell lung cancer, favoring poorly differentiated adenocarcinoma.  Stage IIIA T1c N2M0 #Mediport was placed by Dr. Genevive Bi  # #Elevated alkaline phosphatase. Patient has been evaluated by gastroenterology.  Liver biopsy showed mild sinusoidal dilation and congestion.  Minimal changes suggestive of hepatic process. Iron stain demonstrate minimal focal hepatocellular iron. I discussed with Dr.Wohl via secure chat about patient's pathology result.  No restriction of proceeding with immunotherapy.  Continue watch for the labs.   # Cancer Treatment 04/26/2018 -06/11/2018 Concurrent Chemotherapy [weekly Carbo and Taxol] with RT.  07/10/2018 Started on maintenance Durvalumab.  INTERVAL  HISTORY Timothy Yoder is a 64 y.o. male who has above history reviewed by me today presents for follow up visit for management of stage III non-small cell lung cancer.   Patient has been on maintenance immunotherapy.  Patient has chronic shortness of breath with exertion. He was recently seen by both pulmonology and cardiology. No new complaints   Review of Systems  Constitutional: Positive for fatigue. Negative for chills, diaphoresis, fever and unexpected weight change.  HENT:   Negative for hearing loss, lump/mass, nosebleeds and sore throat.   Eyes: Negative for eye problems and icterus.  Respiratory: Positive for shortness of breath. Negative for chest tightness, cough, hemoptysis and wheezing.   Cardiovascular: Negative for chest pain and leg swelling.  Gastrointestinal: Negative for abdominal distention, abdominal pain, blood in stool, diarrhea, nausea and rectal pain.  Endocrine: Negative for hot flashes.  Genitourinary: Negative for bladder incontinence, difficulty urinating, dysuria, frequency, hematuria and nocturia.   Musculoskeletal: Negative for back pain, flank pain, gait problem and myalgias.  Skin: Negative for rash.  Neurological: Negative for dizziness, gait problem, headaches, numbness and seizures.  Hematological: Negative for adenopathy. Does not bruise/bleed easily.  Psychiatric/Behavioral: Negative for confusion and decreased concentration. The patient is not nervous/anxious.    MEDICAL HISTORY:  Past Medical History:  Diagnosis Date  . BPH (benign prostatic hyperplasia)   . CAD (coronary artery disease)    a. 04/2014 anterolateral STEMI/PCI: LM nl, LAD 72m(2.75x16 Promus DES, D1 50p, LCX nl, RI nl, OM1 100 small/chronic, RCA dominant, 20-355m7085m(FFR 0.90->Med Rx), RPDA 50, RPLA 70, EF 60%.  . CMarland KitchenPD (chronic obstructive pulmonary disease) (HCC)    Dr FleRaul Del Former tobacco use   . Gastroesophageal reflux disease   . HTN (hypertension)   . Hyperlipidemia    .  Hypokalemia   . Malignant neoplasm of lung ([place]) 04/01/2018  . Myocardial infarction ([place]) 04/2014   Dr Sherren Mocha  . Stented coronary artery     SURGICAL HISTORY: Past Surgical History:  Procedure Laterality Date  . CARDIAC CATHETERIZATION    . CHOLECYSTECTOMY N/A 02/15/2016   Procedure: LAPAROSCOPIC CHOLECYSTECTOMY WITH INTRAOPERATIVE CHOLANGIOGRAM;  Surgeon: Leonie Green, MD;  Location: [facility] ORS;  Service: General;  Laterality: N/A;  . COLONOSCOPY    . COLONOSCOPY WITH PROPOFOL N/A 08/02/2015   Procedure: COLONOSCOPY WITH PROPOFOL;  Surgeon: Manya Silvas, MD;  Location: [facility] ENDOSCOPY;  Service: Endoscopy;  Laterality: N/A;  . CORONARY ANGIOPLASTY  04/2014   STENT  . ENDOBRONCHIAL ULTRASOUND N/A 03/26/2018   Procedure: ENDOBRONCHIAL ULTRASOUND;  Surgeon: Flora Lipps, MD;  Location: [facility] ORS;  Service: Cardiopulmonary;  Laterality: N/A;  . ESOPHAGOGASTRODUODENOSCOPY (EGD) WITH PROPOFOL N/A 08/02/2015   Procedure: ESOPHAGOGASTRODUODENOSCOPY (EGD) WITH PROPOFOL;  Surgeon: Manya Silvas, MD;  Location[facility] ENDOSCOPY;  Service: Endoscopy;  Laterality: N/A;  . FLEXIBLE BRONCHOSCOPY N/A 03/26/2018   Procedure: FLEXIBLE BRONCHOSCOPY;  Surgeon: Flora Lipps, MD;  Location: [facility] ORS;  Service: Cardiopulmonary;  Laterality: N/A;  . KNEE ARTHROSCOPY Left   . LEFT HEART CATHETERIZATION WITH CORONARY ANGIOGRAM N/A 05/03/2014   Procedure: LEFT HEART CATHETERIZATION WITH CORONARY ANGIOGRAM;  Surgeon: Blane Ohara, MD;  Location: [facility] CATH LAB;  Service: Cardiovascular;  Laterality: N/A;  . PERCUTANEOUS CORONARY STENT INTERVENTION (PCI-S) N/A 05/05/2014   Procedure: PERCUTANEOUS CORONARY STENT INTERVENTION (PCI-S);  Surgeon: Peter M Martinique, MD;  Location: [facility] CATH LAB;  Service: Cardiovascular;  Laterality: N/A;  . PORTACATH PLACEMENT Left 04/04/2018   Procedure: INSERTION PORT-A-CATH;  Surgeon: [name], MD;  Location: [facility] ORS;  Service: General;  Laterality: Left;     SOCIAL HISTORY: Social History   Socioeconomic History  . Marital status: Married    Spouse name: Not on file  . Number of children: Not on file  . Years of education: Not on file  . Highest education level: Not on file  Occupational History  . Not on file  Tobacco Use  . Smoking status: Former Smoker    Packs/day: 1.00    Years: 40.00    Pack years: 40.00    Types: Cigarettes    Quit date: 02/14/2011    Years since quitting: 8.5  . Smokeless tobacco: Never Used  Substance and Sexual Activity  . Alcohol use: No  . Drug use: No  . Sexual activity: Not on file  Other Topics Concern  . Not on file  Social History Narrative   The patient is married. He lives in [place]. He works full-time. He quit smoking in 2013.   Social Determinants of Health   Financial Resource Strain:   . Difficulty of Paying Living Expenses:   Food Insecurity:   . Worried About Charity fundraiser in the Last Year:   . Arboriculturist in the Last Year:   Transportation Needs:   . Film/video editor (Medical):   Marland Kitchen Lack of Transportation (Non-Medical):   Physical Activity:   . Days of Exercise per Week:   . Minutes of Exercise per Session:   Stress:   . Feeling of Stress :   Social Connections:   . Frequency of Communication with Friends and Family:   . Frequency of Social Gatherings with Friends and Family:   . Attends Religious Services:   . Active Member of Clubs or Organizations:   . Attends  Club or Organization Meetings:   Marland Kitchen Marital Status:   Intimate Partner Violence:   . Fear of Current or Ex-Partner:   . Emotionally Abused:   Marland Kitchen Physically Abused:   . Sexually Abused:     FAMILY HISTORY: Family History  Problem Relation Age of Onset  . CAD Mother 81       Died of an MI  . CAD Father 80       Died of a massive MI  . COPD Sister   . Lung cancer Brother   . Bone cancer Paternal Uncle   . Colon cancer Neg Hx   . Breast cancer Neg Hx     ALLERGIES:  has No Known  Allergies.  MEDICATIONS:  Current Outpatient Medications  Medication Sig Dispense Refill  . acetaminophen (TYLENOL) 500 MG tablet Take 1,000 mg by mouth every 6 (six) hours as needed (for pain.).    Marland Kitchen albuterol (PROAIR HFA) 108 (90 Base) MCG/ACT inhaler Inhale 2 puffs into the lungs every 6 (six) hours as needed for wheezing or shortness of breath. 18 g 5  . aspirin EC 81 MG tablet Take 81 mg by mouth daily.    Marland Kitchen atorvastatin (LIPITOR) 80 MG tablet Take 1 tablet (80 mg total) by mouth daily at 6 PM. Please make yearly appt with Dr. Burt Knack for May for future refills. 1st attempt 90 tablet 0  . carvedilol (COREG) 6.25 MG tablet Take 1 tablet (6.25 mg total) by mouth 2 (two) times daily. Please keep upcoming appt in May with Dr. Burt Knack before anymore refills. Thank you 180 tablet 0  . finasteride (PROSCAR) 5 MG tablet Take by mouth.    . lidocaine-prilocaine (EMLA) cream Apply small amount to port site 1 hour prior to [facility] being accessed and cover with plastic wrap 30 g 1  . nitroGLYCERIN (NITROSTAT) 0.4 MG SL tablet Place 1 tablet (0.4 mg total) under the tongue every 5 (five) minutes as needed for chest pain. X 3 doses 25 tablet 3  . omeprazole (PRILOSEC) 40 MG capsule Take 1 capsule (40 mg total) by mouth 2 (two) times daily. 180 capsule 1  . ondansetron (ZOFRAN) 8 MG tablet PLEASE SEE ATTACHED FOR DETAILED DIRECTIONS    . potassium chloride SA (KLOR-CON M20) 20 MEQ tablet Take 1 tablet (20 mEq total) by mouth daily. 90 tablet 3  . tamsulosin (FLOMAX) 0.4 MG CAPS capsule Take 0.4 mg by mouth daily after supper.    . Tiotropium Bromide Monohydrate (SPIRIVA RESPIMAT) 2.5 MCG/ACT AERS Inhale 2 puffs into the lungs daily. 4 g 0  . traZODone (DESYREL) 50 MG tablet Take 1 tablet (50 mg total) by mouth at bedtime. 90 tablet 0  . umeclidinium bromide (INCRUSE ELLIPTA) 62.5 MCG/INH AEPB Inhale 1 puff into the lungs daily. 30 each 5   No current facility-administered medications for this visit.    Facility-Administered Medications Ordered in Other Visits  Medication Dose Route Frequency Provider Last Rate Last Admin  . sodium chloride flush (NS) 0.9 % injection 10 mL  10 mL Intravenous PRN Earlie Server, MD         PHYSICAL EXAMINATION: ECOG PERFORMANCE STATUS: 1 - Symptomatic but completely ambulatory Vitals:   08/26/19 0842  BP: 122/81  Pulse: 81  Resp: 18  Temp: (!) 95.4 F (35.2 C)  SpO2: 95%   Filed Weights   08/26/19 0842  Weight: 162 lb 11.2 oz (73.8 kg)    Physical Exam Constitutional:      General: He  is not in acute distress. HENT:     Head: Normocephalic and atraumatic.  Eyes:     General: No scleral icterus. Cardiovascular:     Rate and Rhythm: Normal rate and regular rhythm.     Heart sounds: Normal heart sounds.  Pulmonary:     Effort: Pulmonary effort is normal. No respiratory distress.     Breath sounds: No wheezing.  Abdominal:     General: Bowel sounds are normal. There is no distension.     Palpations: Abdomen is soft.  Musculoskeletal:        General: No deformity. Normal range of motion.     Cervical back: Normal range of motion and neck supple.  Skin:    General: Skin is warm and dry.     Findings: No erythema or rash.  Neurological:     Mental Status: He is alert and oriented to person, place, and time. Mental status is at baseline.     Cranial Nerves: No cranial nerve deficit.     Coordination: Coordination normal.  Psychiatric:        Mood and Affect: Mood normal.      LABORATORY DATA:  I have reviewed the data as listed Lab Results  Component Value Date   WBC 5.8 08/11/2019   HGB 13.9 08/11/2019   HCT 41.3 08/11/2019   MCV 93.0 08/11/2019   PLT 183 08/11/2019   Recent Labs    07/14/19 0906 07/28/19 0835 08/11/19 0843  NA 138 137 139  K 3.9 3.9 3.9  CL 105 106 106  CO2 _0 GLUCOSE 82 90 105*  BUN 9 9 7*  CREATININE 0.94 0.98 1.11  CALCIUM 8.6* 8.6* 8.7*  GFRNONAA >60 >60 >60  GFRAA >60 >60 >60  PROT  6.7 6.7 6.6  ALBUMIN 3.7 3.7 3.7  AST _1 ALT _2 ALKPHOS 139* 163* 156*  BILITOT 0.8 0.6 0.7   Iron/TIBC/Ferritin/ %Sat    Component Value Date/Time   IRON 41 (L) 11/21/2018 0856   TIBC 260 11/21/2018 0856   FERRITIN 150 11/21/2018 0856   IRONPCTSAT 16 (L) 11/21/2018 0856    RADIOGRAPHIC STUDIES: I have personally reviewed the radiological images as listed and agreed with the findings in the report.  CT Chest W Contrast  Result Date: 07/09/2019 CLINICAL DATA:  RIGHT lung cancer diagnosed 2019. Chemotherapy complete. Currently on immunotherapy. Short of breath. EXAM: CT CHEST WITH CONTRAST TECHNIQUE: Multidetector CT imaging of the chest was performed during intravenous contrast administration. CONTRAST:  63m OMNIPAQUE IOHEXOL 300 MG/ML  SOLN COMPARISON:  CT 04/23/2019 FINDINGS: Cardiovascular: Coronary artery calcification and aortic atherosclerotic calcification. Port in the anterior chest wall with tip in distal SVC. Mediastinum/Nodes: No axillary supraclavicular adenopathy. No mediastinal hilar adenopathy. No pericardial effusion. Lungs/Pleura: Mild centrilobular emphysema in the upper lobes. Peribronchial thickening in the RIGHT lower lobe at radiation therapy site. No new or suspicious nodularity. Several scattered calcified granulomas in lung noted. Upper Abdomen: Limited view of the liver, kidneys, pancreas are unremarkable. Normal adrenal glands. Musculoskeletal: No aggressive osseous lesion IMPRESSION: 1. Stable post radiation change in the RIGHT lower lobe. 2. No evidence of new or progressive lung carcinoma. 3. Coronary artery calcification and Aortic Atherosclerosis (ICD10-I70.0). Electronically Signed   By: SSuzy BouchardM.D.   On: 07/09/2019 11:31     ASSESSMENT & PLAN:  1. Malignant neoplasm of lower lobe of right lung ([facility]   2. Encounter for antineoplastic immunotherapy   3.  Elevated alkaline phosphatase level   Cancer Staging Malignant neoplasm of lower  lobe of right lung [facility]) Staging form: Lung, AJCC 8th Edition - Clinical stage from 05/13/2018: Stage IIIA (cT1c, cN2, cM0) - Signed by Earlie Server, MD on 05/13/2018  #Stage IIIA lung adenocarcinoma Labs reviewed and discussed with patient. Counts acceptable to proceed with durvalumab treatment today. He has 1 more treatment and will be completing adjuvant immunotherapy treatments. We discussed about surveillance CT scans every 3-6 months for surveillance.  #Port-A-Cath in place, discussed with patient and recommend port flush every 6 to 8 weeks.  Recommend patient to keep Mediport for another year.  If no signs of recurrence, will discuss about taking out the port at that point.  #Chronically elevated alkaline phosphatase, likely secondary to immunotherapy.  Patient has been seen by gastroenterology and was recommended for observation.. Folate deficiency, folate level has resolved.  Vitamin B12 is also adequate.  Follow-up in 2 weeks evaluation prior to next cycle of durvalumab treatments.   Earlie Server, MD, PhD 08/26/19

## 2019-08-28 NOTE — Telephone Encounter (Signed)
ATC patient LMTCB. Not sure why Incruse was ordered for patient looks like from his last visit on 4/30  Plan -Please talk to your cardiologist and make sure your heart pump and blood vessels in the heart are functioning fine -Stop Anoro =-Start higher dose Spiriva Respimat 2 puffs once daily scheduled -Continue albuterol as needed  -Check alpha-1 antitrypsin phenotype given family history of COPD -Do full pulmonary function test in the next few weeks   Will discontinue the Incruse from patients meds. Will wait for patient to call back.

## 2019-08-29 NOTE — Telephone Encounter (Signed)
Sorry this was an error on my part.  I got confused about Anoro and the moment did not realize there was anticholinergic and long-acting beta agonist combination.  I actually wanted to step up his therapy because he was having worsening shortness of breath  -The next correct option should have been for me to do Trelegy  Plan -Apologize on my behalf; happy to talk to him if needed -At this point in time he can discontinue Anoro pending cardiology visit and getting pulmonary function testing  -We can decide at follow-up therapeutic strategy

## 2019-08-29 NOTE — Telephone Encounter (Signed)
Dr. Chase Caller please advise on patients inhalers and what he should be taking. If he needs/wants Spiriva we can do PA if its not covered. Just need clarification

## 2019-09-01 ENCOUNTER — Ambulatory Visit (HOSPITAL_COMMUNITY): Payer: Medicare Other | Attending: Cardiology

## 2019-09-01 ENCOUNTER — Other Ambulatory Visit: Payer: Self-pay

## 2019-09-01 ENCOUNTER — Other Ambulatory Visit: Payer: Medicare Other | Admitting: *Deleted

## 2019-09-01 DIAGNOSIS — I25119 Atherosclerotic heart disease of native coronary artery with unspecified angina pectoris: Secondary | ICD-10-CM

## 2019-09-01 DIAGNOSIS — R0602 Shortness of breath: Secondary | ICD-10-CM | POA: Diagnosis not present

## 2019-09-01 DIAGNOSIS — E782 Mixed hyperlipidemia: Secondary | ICD-10-CM

## 2019-09-01 LAB — LIPID PANEL
Chol/HDL Ratio: 2.9 ratio (ref 0.0–5.0)
Cholesterol, Total: 90 mg/dL — ABNORMAL LOW (ref 100–199)
HDL: 31 mg/dL — ABNORMAL LOW (ref >39)
LDL Chol Calc (NIH): 44 mg/dL (ref 0–99)
Triglycerides: 67 mg/dL (ref 0–149)
VLDL Cholesterol Cal: 15 mg/dL (ref 5–40)

## 2019-09-02 NOTE — Progress Notes (Signed)
Pharmacist Chemotherapy Monitoring - Follow Up Assessment    I verify that I have reviewed each item in the below checklist:  . Regimen for the patient is scheduled for the appropriate day and plan matches scheduled date. Marland Kitchen Appropriate non-routine labs are ordered dependent on drug ordered. . If applicable, additional medications reviewed and ordered per protocol based on lifetime cumulative doses and/or treatment regimen.   Plan for follow-up and/or issues identified: No . I-vent associated with next due treatment: No . MD and/or nursing notified: No  Timothy Yoder 09/02/2019 8:21 AM

## 2019-09-09 ENCOUNTER — Encounter: Payer: Self-pay | Admitting: Oncology

## 2019-09-09 ENCOUNTER — Inpatient Hospital Stay (HOSPITAL_BASED_OUTPATIENT_CLINIC_OR_DEPARTMENT_OTHER): Payer: Medicare Other | Admitting: Oncology

## 2019-09-09 ENCOUNTER — Other Ambulatory Visit: Payer: Self-pay

## 2019-09-09 ENCOUNTER — Inpatient Hospital Stay: Payer: Medicare Other

## 2019-09-09 VITALS — BP 139/86 | HR 62 | Temp 95.5°F | Resp 18 | Wt 162.2 lb

## 2019-09-09 DIAGNOSIS — R748 Abnormal levels of other serum enzymes: Secondary | ICD-10-CM | POA: Diagnosis not present

## 2019-09-09 DIAGNOSIS — C3431 Malignant neoplasm of lower lobe, right bronchus or lung: Secondary | ICD-10-CM

## 2019-09-09 DIAGNOSIS — Z95828 Presence of other vascular implants and grafts: Secondary | ICD-10-CM

## 2019-09-09 DIAGNOSIS — Z5112 Encounter for antineoplastic immunotherapy: Secondary | ICD-10-CM

## 2019-09-09 LAB — CBC WITH DIFFERENTIAL/PLATELET
Abs Immature Granulocytes: 0.06 10*3/uL (ref 0.00–0.07)
Basophils Absolute: 0 10*3/uL (ref 0.0–0.1)
Basophils Relative: 1 %
Eosinophils Absolute: 0.1 10*3/uL (ref 0.0–0.5)
Eosinophils Relative: 2 %
HCT: 41.4 % (ref 39.0–52.0)
Hemoglobin: 14.2 g/dL (ref 13.0–17.0)
Immature Granulocytes: 1 %
Lymphocytes Relative: 20 %
Lymphs Abs: 1.2 10*3/uL (ref 0.7–4.0)
MCH: 31.8 pg (ref 26.0–34.0)
MCHC: 34.3 g/dL (ref 30.0–36.0)
MCV: 92.6 fL (ref 80.0–100.0)
Monocytes Absolute: 0.6 10*3/uL (ref 0.1–1.0)
Monocytes Relative: 10 %
Neutro Abs: 3.9 10*3/uL (ref 1.7–7.7)
Neutrophils Relative %: 66 %
Platelets: 166 10*3/uL (ref 150–400)
RBC: 4.47 MIL/uL (ref 4.22–5.81)
RDW: 12.8 % (ref 11.5–15.5)
WBC: 5.9 10*3/uL (ref 4.0–10.5)
nRBC: 0 % (ref 0.0–0.2)

## 2019-09-09 LAB — COMPREHENSIVE METABOLIC PANEL
ALT: 19 U/L (ref 0–44)
AST: 19 U/L (ref 15–41)
Albumin: 3.7 g/dL (ref 3.5–5.0)
Alkaline Phosphatase: 130 U/L — ABNORMAL HIGH (ref 38–126)
Anion gap: 6 (ref 5–15)
BUN: 10 mg/dL (ref 8–23)
CO2: 27 mmol/L (ref 22–32)
Calcium: 8.4 mg/dL — ABNORMAL LOW (ref 8.9–10.3)
Chloride: 105 mmol/L (ref 98–111)
Creatinine, Ser: 1.05 mg/dL (ref 0.61–1.24)
GFR calc Af Amer: >60 mL/min (ref >60)
GFR calc non Af Amer: >60 mL/min (ref >60)
Glucose, Bld: 104 mg/dL — ABNORMAL HIGH (ref 70–99)
Potassium: 3.7 mmol/L (ref 3.5–5.1)
Sodium: 138 mmol/L (ref 135–145)
Total Bilirubin: 0.6 mg/dL (ref 0.3–1.2)
Total Protein: 6.6 g/dL (ref 6.5–8.1)

## 2019-09-09 LAB — TSH: TSH: 1.252 u[IU]/mL (ref 0.350–4.500)

## 2019-09-09 MED ORDER — HEPARIN SOD (PORK) LOCK FLUSH 100 UNIT/ML IV SOLN
INTRAVENOUS | Status: AC
  Filled 2019-09-09: qty 5

## 2019-09-09 MED ORDER — HEPARIN SOD (PORK) LOCK FLUSH 100 UNIT/ML IV SOLN
500.0000 [IU] | Freq: Once | INTRAVENOUS | Status: AC | PRN
  Administered 2019-09-09: 500 [IU]
  Filled 2019-09-09: qty 5

## 2019-09-09 MED ORDER — SODIUM CHLORIDE 0.9% FLUSH
10.0000 mL | INTRAVENOUS | Status: DC | PRN
  Administered 2019-09-09: 10 mL via INTRAVENOUS
  Filled 2019-09-09: qty 10

## 2019-09-09 MED ORDER — SODIUM CHLORIDE 0.9 % IV SOLN
Freq: Once | INTRAVENOUS | Status: AC
  Filled 2019-09-09: qty 250

## 2019-09-09 MED ORDER — SODIUM CHLORIDE 0.9 % IV SOLN
10.0000 mg/kg | Freq: Once | INTRAVENOUS | Status: AC
  Administered 2019-09-09: 740 mg via INTRAVENOUS
  Filled 2019-09-09: qty 10

## 2019-09-09 NOTE — Progress Notes (Signed)
Hematology/Oncology follow up note [facility] [phone] [phone]   Patient Care Team: Baxter Hire, MD as PCP - General (Internal Medicine) Sherren Mocha, MD as PCP - Cardiology (Cardiology) Telford Nab, RN as Registered Nurse  REFERRING PROVIDER: Dr.Fleming REASON FOR VISIT:  Follow-up for management of stage III non-small cell lung cancer   HISTORY OF PRESENTING ILLNESS:  Timothy Yoder is a  64 y.o.  male with PMH listed below who was referred to me for evaluation of lung mass.  Patient is a former smoker quit smoking 7 years ago.  40-pack-year. Patient chest lung cancer screening on 03/11/2018. CT scan showed 2.4 cm right lower lobe nodule with a new necrotic.  Subcarinal lymph node. PET scan showed hypermetabolic right lower pulmonary nodule, consistent with primary bronchogenic carcinoma.  Fluid density subcarinal structure demonstrate no significant hypermetabolism.  Although this could represent an incidental benign lesion such as a cyst given the interval development since 02/24/2017, necrotic adenopathy cannot be excluded and tissue sampling should be considered.  # underwent biopsy via bronchoscopy.  Subcarinal lymph node biopsy positive for non-small cell lung cancer, favoring poorly differentiated adenocarcinoma.  Stage IIIA T1c N2M0 #Mediport was placed by Dr. Genevive Bi  # #Elevated alkaline phosphatase. Patient has been evaluated by gastroenterology.  Liver biopsy showed mild sinusoidal dilation and congestion.  Minimal changes suggestive of hepatic process. Iron stain demonstrate minimal focal hepatocellular iron. I discussed with Dr.Wohl via secure chat about patient's pathology result.  No restriction of proceeding with immunotherapy.  Continue watch for the labs.   # Cancer Treatment 04/26/2018 -06/11/2018 Concurrent Chemotherapy [weekly Carbo and Taxol] with RT.  07/10/2018 Started on maintenance Durvalumab.  INTERVAL  HISTORY Timothy Yoder is a 64 y.o. male who has above history reviewed by me today presents for follow up visit for management of stage III non-small cell lung cancer.   Patient has been on maintenance immunotherapy. Patient has seen pulmonology and there is plan of changing his inhaler to further help his shortness of breath symptoms. He has no new complaints.  Chronic shortness of breath unchanged.   Review of Systems  Constitutional: Positive for fatigue. Negative for appetite change, chills, diaphoresis, fever and unexpected weight change.  HENT:   Negative for hearing loss, lump/mass, nosebleeds, sore throat and voice change.   Eyes: Negative for eye problems and icterus.  Respiratory: Positive for shortness of breath. Negative for chest tightness, cough, hemoptysis and wheezing.   Cardiovascular: Negative for chest pain and leg swelling.  Gastrointestinal: Negative for abdominal distention, abdominal pain, blood in stool, diarrhea, nausea and rectal pain.  Endocrine: Negative for hot flashes.  Genitourinary: Negative for bladder incontinence, difficulty urinating, dysuria, frequency, hematuria and nocturia.   Musculoskeletal: Negative for arthralgias, back pain, flank pain, gait problem and myalgias.  Skin: Negative for itching and rash.  Neurological: Negative for dizziness, gait problem, headaches, light-headedness, numbness and seizures.  Hematological: Negative for adenopathy. Does not bruise/bleed easily.  Psychiatric/Behavioral: Negative for confusion and decreased concentration. The patient is not nervous/anxious.    MEDICAL HISTORY:  Past Medical History:  Diagnosis Date  . BPH (benign prostatic hyperplasia)   . CAD (coronary artery disease)    a. 04/2014 anterolateral STEMI/PCI: LM nl, LAD 56m(2.75x16 Promus DES, D1 50p, LCX nl, RI nl, OM1 100 small/chronic, RCA dominant, [id number](FFR 0.90->Med Rx), RPDA 50, RPLA 70, EF 60%.  . CMarland KitchenPD (chronic obstructive pulmonary  disease) (HCC)    Dr FleRaul Del  Former tobacco use   . Gastroesophageal reflux disease   . HTN (hypertension)   . Hyperlipidemia   . Hypokalemia   . Malignant neoplasm of lung ([place]) 04/01/2018  . Myocardial infarction ([place]) 04/2014   Dr Sherren Mocha  . Stented coronary artery     SURGICAL HISTORY: Past Surgical History:  Procedure Laterality Date  . CARDIAC CATHETERIZATION    . CHOLECYSTECTOMY N/A 02/15/2016   Procedure: LAPAROSCOPIC CHOLECYSTECTOMY WITH INTRAOPERATIVE CHOLANGIOGRAM;  Surgeon: Leonie Green, MD;  Location: [facility] ORS;  Service: General;  Laterality: N/A;  . COLONOSCOPY    . COLONOSCOPY WITH PROPOFOL N/A 08/02/2015   Procedure: COLONOSCOPY WITH PROPOFOL;  Surgeon: Manya Silvas, MD;  Location: [facility] ENDOSCOPY;  Service: Endoscopy;  Laterality: N/A;  . CORONARY ANGIOPLASTY  04/2014   STENT  . ENDOBRONCHIAL ULTRASOUND N/A 03/26/2018   Procedure: ENDOBRONCHIAL ULTRASOUND;  Surgeon: Flora Lipps, MD;  Location: [facility] ORS;  Service: Cardiopulmonary;  Laterality: N/A;  . ESOPHAGOGASTRODUODENOSCOPY (EGD) WITH PROPOFOL N/A 08/02/2015   Procedure: ESOPHAGOGASTRODUODENOSCOPY (EGD) WITH PROPOFOL;  Surgeon: Manya Silvas, MD;  Location: [facility] ENDOSCOPY;  Service: Endoscopy;  Laterality: N/A;  . FLEXIBLE BRONCHOSCOPY N/A 03/26/2018   Procedure: FLEXIBLE BRONCHOSCOPY;  Surgeon: Flora Lipps, MD;  Location: [facility] ORS;  Service: Cardiopulmonary;  Laterality: N/A;  . KNEE ARTHROSCOPY Left   . LEFT HEART CATHETERIZATION WITH CORONARY ANGIOGRAM N/A 05/03/2014   Procedure: LEFT HEART CATHETERIZATION WITH CORONARY ANGIOGRAM;  Surgeon: Blane Ohara, MD;  Location: [facility] CATH LAB;  Service: Cardiovascular;  Laterality: N/A;  . PERCUTANEOUS CORONARY STENT INTERVENTION (PCI-S) N/A 05/05/2014   Procedure: PERCUTANEOUS CORONARY STENT INTERVENTION (PCI-S);  Surgeon: Peter M Martinique, MD;  Location: [facility] CATH LAB;  Service: Cardiovascular;  Laterality: N/A;  . PORTACATH PLACEMENT Left 04/04/2018     Procedure: INSERTION PORT-A-CATH;  Surgeon: [name], MD;  Location: [facility] ORS;  Service: General;  Laterality: Left;    SOCIAL HISTORY: Social History   Socioeconomic History  . Marital status: Married    Spouse name: Not on file  . Number of children: Not on file  . Years of education: Not on file  . Highest education level: Not on file  Occupational History  . Not on file  Tobacco Use  . Smoking status: Former Smoker    Packs/day: 1.00    Years: 40.00    Pack years: 40.00    Types: Cigarettes    Quit date: 02/14/2011    Years since quitting: 8.5  . Smokeless tobacco: Never Used  Substance and Sexual Activity  . Alcohol use: No  . Drug use: No  . Sexual activity: Not on file  Other Topics Concern  . Not on file  Social History Narrative   The patient is married. He lives in [place]. He works full-time. He quit smoking in 2013.   Social Determinants of Health   Financial Resource Strain:   . Difficulty of Paying Living Expenses:   Food Insecurity:   . Worried About Charity fundraiser in the Last Year:   . Arboriculturist in the Last Year:   Transportation Needs:   . Film/video editor (Medical):   Marland Kitchen Lack of Transportation (Non-Medical):   Physical Activity:   . Days of Exercise per Week:   . Minutes of Exercise per Session:   Stress:   . Feeling of Stress :   Social Connections:   . Frequency of Communication with Friends and Family:   . Frequency of Social Gatherings with  Friends and Family:   . Attends Religious Services:   . Active Member of Clubs or Organizations:   . Attends Archivist Meetings:   Marland Kitchen Marital Status:   Intimate Partner Violence:   . Fear of Current or Ex-Partner:   . Emotionally Abused:   Marland Kitchen Physically Abused:   . Sexually Abused:     FAMILY HISTORY: Family History  Problem Relation Age of Onset  . CAD Mother 28       Died of an MI  . CAD Father 50       Died of a massive MI  . COPD Sister   . Lung  cancer Brother   . Bone cancer Paternal Uncle   . Colon cancer Neg Hx   . Breast cancer Neg Hx     ALLERGIES:  has No Known Allergies.  MEDICATIONS:  Current Outpatient Medications  Medication Sig Dispense Refill  . acetaminophen (TYLENOL) 500 MG tablet Take 1,000 mg by mouth every 6 (six) hours as needed (for pain.).    Marland Kitchen albuterol (PROAIR HFA) 108 (90 Base) MCG/ACT inhaler Inhale 2 puffs into the lungs every 6 (six) hours as needed for wheezing or shortness of breath. 18 g 5  . aspirin EC 81 MG tablet Take 81 mg by mouth daily.    Marland Kitchen atorvastatin (LIPITOR) 80 MG tablet Take 1 tablet (80 mg total) by mouth daily at 6 PM. Please make yearly appt with Dr. Burt Knack for May for future refills. 1st attempt 90 tablet 0  . carvedilol (COREG) 6.25 MG tablet Take 1 tablet (6.25 mg total) by mouth 2 (two) times daily. Please keep upcoming appt in May with Dr. Burt Knack before anymore refills. Thank you 180 tablet 0  . finasteride (PROSCAR) 5 MG tablet Take by mouth.    . lidocaine-prilocaine (EMLA) cream Apply small amount to port site 1 hour prior to [facility] being accessed and cover with plastic wrap 30 g 1  . nitroGLYCERIN (NITROSTAT) 0.4 MG SL tablet Place 1 tablet (0.4 mg total) under the tongue every 5 (five) minutes as needed for chest pain. X 3 doses 25 tablet 3  . omeprazole (PRILOSEC) 40 MG capsule Take 1 capsule (40 mg total) by mouth 2 (two) times daily. 180 capsule 1  . ondansetron (ZOFRAN) 8 MG tablet PLEASE SEE ATTACHED FOR DETAILED DIRECTIONS    . potassium chloride SA (KLOR-CON M20) 20 MEQ tablet Take 1 tablet (20 mEq total) by mouth daily. 90 tablet 3  . tamsulosin (FLOMAX) 0.4 MG CAPS capsule Take 0.4 mg by mouth daily after supper.    . Tiotropium Bromide Monohydrate (SPIRIVA RESPIMAT) 2.5 MCG/ACT AERS Inhale 2 puffs into the lungs daily. 4 g 0  . traZODone (DESYREL) 50 MG tablet Take 1 tablet (50 mg total) by mouth at bedtime. 90 tablet 0   No current facility-administered medications for  this visit.   Facility-Administered Medications Ordered in Other Visits  Medication Dose Route Frequency Provider Last Rate Last Admin  . sodium chloride flush (NS) 0.9 % injection 10 mL  10 mL Intravenous PRN Earlie Server, MD   10 mL at 09/09/19 0836     PHYSICAL EXAMINATION: ECOG PERFORMANCE STATUS: 1 - Symptomatic but completely ambulatory Vitals:   09/09/19 0850  BP: 139/86  Pulse: 62  Resp: 18  Temp: (!) 95.5 F (35.3 C)  SpO2: 99%   Filed Weights   09/09/19 0850  Weight: 162 lb 3.2 oz (73.6 kg)    Physical Exam Constitutional:  General: He is not in acute distress. HENT:     Head: Normocephalic and atraumatic.  Eyes:     General: No scleral icterus. Cardiovascular:     Rate and Rhythm: Normal rate and regular rhythm.     Heart sounds: Normal heart sounds.  Pulmonary:     Effort: Pulmonary effort is normal. No respiratory distress.     Breath sounds: No wheezing.  Abdominal:     General: Bowel sounds are normal. There is no distension.     Palpations: Abdomen is soft.  Musculoskeletal:        General: No deformity. Normal range of motion.     Cervical back: Normal range of motion and neck supple.  Skin:    General: Skin is warm and dry.     Findings: No erythema or rash.  Neurological:     Mental Status: He is alert and oriented to person, place, and time. Mental status is at baseline.     Cranial Nerves: No cranial nerve deficit.     Coordination: Coordination normal.  Psychiatric:        Mood and Affect: Mood normal.      LABORATORY DATA:  I have reviewed the data as listed Lab Results  Component Value Date   WBC 5.9 09/09/2019   HGB 14.2 09/09/2019   HCT 41.4 09/09/2019   MCV 92.6 09/09/2019   PLT 166 09/09/2019   Recent Labs    08/11/19 0843 08/26/19 0803 09/09/19 0833  NA 139 136 138  K 3.9 3.9 3.7  CL 106 103 105  CO2 _0 GLUCOSE 105* 114* 104*  BUN 7* 10 10  CREATININE 1.11 1.04 1.05  CALCIUM 8.7* 8.6* 8.4*  GFRNONAA >60  >60 >60  GFRAA >60 >60 >60  PROT 6.6 6.6 6.6  ALBUMIN 3.7 3.8 3.7  AST _1 ALT _2 ALKPHOS 156* 144* 130*  BILITOT 0.7 1.0 0.6   Iron/TIBC/Ferritin/ %Sat    Component Value Date/Time   IRON 41 (L) 11/21/2018 0856   TIBC 260 11/21/2018 0856   FERRITIN 150 11/21/2018 0856   IRONPCTSAT 16 (L) 11/21/2018 0856    RADIOGRAPHIC STUDIES: I have personally reviewed the radiological images as listed and agreed with the findings in the report.  CT Chest W Contrast  Result Date: 07/09/2019 CLINICAL DATA:  RIGHT lung cancer diagnosed 2019. Chemotherapy complete. Currently on immunotherapy. Short of breath. EXAM: CT CHEST WITH CONTRAST TECHNIQUE: Multidetector CT imaging of the chest was performed during intravenous contrast administration. CONTRAST:  63m OMNIPAQUE IOHEXOL 300 MG/ML  SOLN COMPARISON:  CT 04/23/2019 FINDINGS: Cardiovascular: Coronary artery calcification and aortic atherosclerotic calcification. Port in the anterior chest wall with tip in distal SVC. Mediastinum/Nodes: No axillary supraclavicular adenopathy. No mediastinal hilar adenopathy. No pericardial effusion. Lungs/Pleura: Mild centrilobular emphysema in the upper lobes. Peribronchial thickening in the RIGHT lower lobe at radiation therapy site. No new or suspicious nodularity. Several scattered calcified granulomas in lung noted. Upper Abdomen: Limited view of the liver, kidneys, pancreas are unremarkable. Normal adrenal glands. Musculoskeletal: No aggressive osseous lesion IMPRESSION: 1. Stable post radiation change in the RIGHT lower lobe. 2. No evidence of new or progressive lung carcinoma. 3. Coronary artery calcification and Aortic Atherosclerosis (ICD10-I70.0). Electronically Signed   By: SSuzy BouchardM.D.   On: 07/09/2019 11:31   ECHOCARDIOGRAM COMPLETE  Result Date: 09/01/2019    ECHOCARDIOGRAM REPORT   Patient Name:   SATHOL BOLDSDate of Exam: 09/01/2019  Medical Rec #:  [id number]      Height:       68.0  in Accession #:    [id number]     Weight:       162.7 lb Date of Birth:  [birth year]       BSA:          1.872 m Patient Age:    51 years       BP:           142/82 mmHg Patient Gender: M              HR:           63 bpm. Exam Location:  [place] Procedure: 2D Echo, Cardiac Doppler and Color Doppler Indications:    R06.02  History:        Patient has no prior history of Echocardiogram examinations. CAD                 and Previous Myocardial Infarction, COPD,                 Signs/Symptoms:Shortness of Breath; Risk Factors:Hypertension,                 Dyslipidemia and Former Smoker.  Sonographer:    Coralyn Helling RDCS Referring Phys: [place]  1. Left ventricular ejection fraction, by estimation, is 55 to 60%. The left ventricle has normal function. The left ventricle has no regional wall motion abnormalities. Left ventricular diastolic parameters were normal.  2. Right ventricular systolic function is normal. The right ventricular size is normal.  3. The mitral valve is normal in structure. Trivial mitral valve regurgitation. No evidence of mitral stenosis.  4. The aortic valve is normal in structure. Aortic valve regurgitation is trivial. No aortic stenosis is present.  5. The inferior vena cava is normal in size with greater than 50% respiratory variability, suggesting right atrial pressure of 3 mmHg. FINDINGS  Left Ventricle: Left ventricular ejection fraction, by estimation, is 55 to 60%. The left ventricle has normal function. The left ventricle has no regional wall motion abnormalities. The left ventricular internal cavity size was normal in size. There is  no left ventricular hypertrophy of the basal-septal segment. Left ventricular diastolic parameters were normal. Normal left ventricular filling pressure. Right Ventricle: The right ventricular size is normal. No increase in right ventricular wall thickness. Right ventricular systolic function is normal. Left Atrium: Left  atrial size was normal in size. Right Atrium: Right atrial size was normal in size. Pericardium: There is no evidence of pericardial effusion. Mitral Valve: The mitral valve is normal in structure. Normal mobility of the mitral valve leaflets. Trivial mitral valve regurgitation. No evidence of mitral valve stenosis. Tricuspid Valve: The tricuspid valve is normal in structure. Tricuspid valve regurgitation is trivial. No evidence of tricuspid stenosis. Aortic Valve: The aortic valve is normal in structure. Aortic valve regurgitation is trivial. No aortic stenosis is present. Pulmonic Valve: The pulmonic valve was normal in structure. Pulmonic valve regurgitation is not visualized. No evidence of pulmonic stenosis. Aorta: The aortic root is normal in size and structure. Venous: The inferior vena cava is normal in size with greater than 50% respiratory variability, suggesting right atrial pressure of 3 mmHg. IAS/Shunts: The interatrial septum appears to be lipomatous. No atrial level shunt detected by color flow Doppler.  LEFT VENTRICLE PLAX 2D LVIDd:         4.10 cm  Diastology LVIDs:  2.90 cm  LV e' lateral:   10.60 cm/s LV PW:         1.00 cm  LV E/e' lateral: 8.5 LV IVS:        1.20 cm  LV e' medial:    6.31 cm/s LVOT diam:     2.00 cm  LV E/e' medial:  14.3 LV SV:         61 LV SV Index:   33 LVOT Area:     3.14 cm  RIGHT VENTRICLE             IVC RV S prime:     10.80 cm/s  IVC diam: 1.00 cm TAPSE (M-mode): 2.0 cm LEFT ATRIUM             Index       RIGHT ATRIUM           Index LA diam:        3.50 cm 1.87 cm/m  RA Pressure: 3.00 mmHg LA Vol (A2C):   35.6 ml 19.02 ml/m RA Area:     12.70 cm LA Vol (A4C):   24.8 ml 13.25 ml/m RA Volume:   29.60 ml  15.81 ml/m LA Biplane Vol: 30.9 ml 16.51 ml/m  AORTIC VALVE LVOT Vmax:   83.60 cm/s LVOT Vmean:  54.000 cm/s LVOT VTI:    0.194 m  AORTA Ao Root diam: 3.40 cm Ao Asc diam:  3.60 cm MV E velocity: 90.00 cm/s   TRICUSPID VALVE MV A velocity: 105.00 cm/s   Estimated RAP:  3.00 mmHg MV E/A ratio:  0.86                             SHUNTS                             Systemic VTI:  0.19 m                             Systemic Diam: 2.00 cm Fransico Him MD Electronically signed by Fransico Him MD Signature Date/Time: 09/01/2019/8:27:14 AM    Final      ASSESSMENT & PLAN:  1. Malignant neoplasm of lower lobe of right lung ([place])   2. Encounter for antineoplastic immunotherapy   3. Port-A-Cath in place   4. Elevated alkaline phosphatase level   Cancer Staging Malignant neoplasm of lower lobe of right lung [facility]) Staging form: Lung, AJCC 8th Edition - Clinical stage from 05/13/2018: Stage IIIA (cT1c, cN2, cM0) - Signed by Earlie Server, MD on 05/13/2018  #Stage IIIA lung adenocarcinoma Labs reviewed and discussed with patient.  Counts acceptable to proceed with durvalumab treatments today. He will be completing total 26 durvalumab treatments today. We discussed about surveillance CT scans every 3 to 6 months in the future.  #Port-A-Cath in place, patient plans to keep his Mediport and I recommend continue port flush every 6 to 8 weeks. #Chronically elevated alkaline phosphatase, likely secondary to immunotherapy.  Patient has been seen by gastroenterology and was recommended for observation.. Alkaline phosphatase numbers are stable.  Continue to monitor.  I will order CT chest without contrast at the end of June.  Plan to see patient after that.   Earlie Server, MD, PhD 09/09/19

## 2019-09-09 NOTE — Progress Notes (Signed)
Patient here for follow up. No new concerns voiced.  °

## 2019-09-15 ENCOUNTER — Ambulatory Visit: Payer: Medicare Other | Admitting: Cardiovascular Disease

## 2019-09-15 ENCOUNTER — Other Ambulatory Visit: Payer: Self-pay

## 2019-09-16 MED ORDER — ATORVASTATIN CALCIUM 80 MG PO TABS: 80.0000 mg | ORAL_TABLET | Freq: Every day | ORAL | 3 refills | Status: DC

## 2019-09-16 NOTE — Telephone Encounter (Signed)
Called and spoke with the patient. He stated that he was upset with the lack of communication from our office in regards to his appointments. He had waited over 3 weeks to get scheduled for a PFT and when he was finally scheduled, it was almost a month after his scheduled follow up. He was scheduled for a follow on 09/19/19 in [place]. He wishes to cancel this appointment since the PFT will not happen until 6/14.   I did attempt to get him scheduled for a F/U after 6/14 but there are no available slots. I looked in both [place] for MR. No APP appts either in [place]. He lives in [place].   MR, please advise. Thanks.

## 2019-09-17 NOTE — Telephone Encounter (Signed)
pft first and then app/ or me in [facility] -> will be opening July schedule soon

## 2019-09-19 ENCOUNTER — Ambulatory Visit: Payer: Medicare Other | Admitting: Internal Medicine

## 2019-10-01 ENCOUNTER — Telehealth: Payer: Self-pay

## 2019-10-01 NOTE — Telephone Encounter (Signed)
Pt is aware of date/time of covid test prior to PFT. 10[facility]03/[facility]

## 2019-10-03 ENCOUNTER — Other Ambulatory Visit: Payer: Self-pay

## 2019-10-03 ENCOUNTER — Other Ambulatory Visit
Admission: RE | Admit: 2019-10-03 | Discharge: 2019-10-03 | Disposition: A | Discharge status: Home / Self Care | Payer: Medicare Other | Source: Ambulatory Visit | Attending: Internal Medicine | Admitting: Internal Medicine

## 2019-10-03 DIAGNOSIS — Z01812 Encounter for preprocedural laboratory examination: Secondary | ICD-10-CM | POA: Insufficient documentation

## 2019-10-03 DIAGNOSIS — Z20822 Contact with and (suspected) exposure to covid-19: Secondary | ICD-10-CM | POA: Insufficient documentation

## 2019-10-03 LAB — SARS CORONAVIRUS 2 (TAT 6-24 HRS): SARS Coronavirus 2: NEGATIVE

## 2019-10-06 ENCOUNTER — Ambulatory Visit: Payer: Medicare Other | Attending: Internal Medicine

## 2019-10-06 ENCOUNTER — Encounter: Payer: Self-pay | Admitting: Adult Health

## 2019-10-06 ENCOUNTER — Other Ambulatory Visit: Payer: Self-pay

## 2019-10-06 ENCOUNTER — Ambulatory Visit (INDEPENDENT_AMBULATORY_CARE_PROVIDER_SITE_OTHER): Payer: Medicare Other | Admitting: Adult Health

## 2019-10-06 DIAGNOSIS — C3431 Malignant neoplasm of lower lobe, right bronchus or lung: Secondary | ICD-10-CM

## 2019-10-06 DIAGNOSIS — J449 Chronic obstructive pulmonary disease, unspecified: Secondary | ICD-10-CM

## 2019-10-06 DIAGNOSIS — I25119 Atherosclerotic heart disease of native coronary artery with unspecified angina pectoris: Secondary | ICD-10-CM

## 2019-10-06 MED ORDER — ALBUTEROL SULFATE (2.5 MG/3ML) 0.083% IN NEBU
2.5000 mg | INHALATION_SOLUTION | Freq: Once | RESPIRATORY_TRACT | Status: AC
  Administered 2019-10-06: 2.5 mg via RESPIRATORY_TRACT
  Filled 2019-10-06: qty 3

## 2019-10-06 MED ORDER — ANORO ELLIPTA 62.5-25 MCG/INH IN AEPB: 1.0000 | INHALATION_SPRAY | Freq: Every day | RESPIRATORY_TRACT | 5 refills | Status: DC

## 2019-10-06 NOTE — Progress Notes (Signed)
Virtual Visit via Telephone Note  I connected with Timothy Yoder on 10/06/19 at  3:30 PM EDT by telephone and verified that I am speaking with the correct person using two identifiers.  Location: Patient: Home  Provider: Office    I discussed the limitations, risks, security and privacy concerns of performing an evaluation and management service by telephone and the availability of in person appointments. I also discussed with the patient that there may be a patient responsible charge related to this service. The patient expressed understanding and agreed to proceed.   History of Present Illness: 64 year old male former smoker (quit 2012) followed for moderate COPD, lung cancer adenocarcinoma stage IIIa right lower lobe (diagnosed 03/2018 )  Today's televisit is a follow-up visit for COPD and lung cancer.  Patient returns for a 6-week follow-up.  Last visit patient was seen in the office alpha-1 testing is normal with a phenotype of MM and normal level.  Patient as above has been diagnosed with adenocarcinoma, he underwent concurrent chemotherapy in January and February 2021.  Completed XRT.  And started on durvalumab in March 2020 until early May 2021.  Since starting cancer treatment he says his energy level and activity tolerance has been down.  He is having more shortness of breath with activities.  Most recent CT chest July 09, 2019 showed stable radiation changes of the right lower lobe.  No evidence of new or progressive lung carcinoma.  The 10th 2021 showed EF of 55 to 60%.  Right ventricular  size and right ventricular systolic function normal. Patient was set up for pulmonary function testing which shows stable lung function -moderate airflow obstruction and severe diffusing defect. With FEV1 at 56%, ratio 53, FVC 79%, no significant bronchodilator response (10% bronchodilator change), DLCO 41%.  We discussed his test results and went over them in detail.   Last visit patient was changed  from Anoro to [place] however his insurance would not cover Spiriva.  He was changed to Incruse but does not feel like it is a strong as a nor O. He denies any cough or wheezing.  Denies any leg swelling or calf pain.  No hemoptysis.  Observations/Objective: FEV1 53% questionable date  03/11/2018 CT chest lung screen 1. New 2.4 cm right lower lobe nodule with a new necrotic appearing subcarinal lymph node. Lung-RADS 4B, suspicious. Additional imaging evaluation or consultation with Pulmonology or Thoracic Surgery recommended. These results will be called to the ordering clinician or representative by the Radiologist Assistant, and communication documented in the PACS or zVision Dashboard. 2. Aortic atherosclerosis (ICD10-170.0). Coronary arterycalcification.3. Emphysema (ICD10-J43.9).  03/13/2018 PET scan #Hypermetabolic right lower lobe pulmonary nodule, consistent with primary bronchogenic carcinoma. Fluid density subcarinal structure demonstrates no significant hypermetabolism. Although this could represent an incidental benign lesion such as a bronchogenic cyst, given interval development since 02/24/2017, necrotic adenopathy cannot be excluded and tissue sampling should be considered. \ Assessment and Plan: Patient has moderate to severe COPD with emphysema.  He does have some high symptom burden.  With shortness of breath and decreased activity tolerance hard to determine if this is from physical deconditioning with recent chemotherapy radiation and immunotherapy over the last year for his recently diagnosed lung cancer. Patient does not check his oxygen levels at home have recommended him to get a pulse oximeter and check his oxygen levels at home and report any oxygen levels less than 90%.  On return visit we will do a formal walk test in the office. For now  returned back to Anoro.  He has been off of his treatment for almost 4 weeks now will determine if his activity tolerance improves.   If not we will need to do further testing.  Plan  Patient Instructions  Restart ANORO 1 puff daily , rinse after use.  Check oxygen level on pulse oximeter at rest and with activity , Goal if for oxygen level to be greater >90%.  Follow up with Dr. Chase Caller in 6 weeks at [facility] office and As needed   Please contact office for sooner follow up if symptoms do not improve or worsen or seek emergency care       Follow Up Instructions: Follow up in 4-6 weeks and As needed   Please contact office for sooner follow up if symptoms do not improve or worsen or seek emergency care     I discussed the assessment and treatment plan with the patient. The patient was provided an opportunity to ask questions and all were answered. The patient agreed with the plan and demonstrated an understanding of the instructions.   The patient was advised to call back or seek an in-person evaluation if the symptoms worsen or if the condition fails to improve as anticipated.  I provided  22  minutes of non-face-to-face time during this encounter.   Rexene Edison, NP

## 2019-10-06 NOTE — Patient Instructions (Addendum)
Restart ANORO 1 puff daily , rinse after use.  Check oxygen level on pulse oximeter at rest and with activity , Goal if for oxygen level to be greater >90%.  Follow up with Dr. Chase Caller in 6 weeks at [facility] office and As needed   Please contact office for sooner follow up if symptoms do not improve or worsen or seek emergency care

## 2019-10-17 ENCOUNTER — Ambulatory Visit
Admission: RE | Admit: 2019-10-17 | Discharge: 2019-10-17 | Disposition: A | Discharge status: Home / Self Care | Payer: Medicare Other | Source: Ambulatory Visit | Attending: Oncology | Admitting: Oncology

## 2019-10-17 ENCOUNTER — Other Ambulatory Visit: Payer: Self-pay

## 2019-10-17 DIAGNOSIS — C3431 Malignant neoplasm of lower lobe, right bronchus or lung: Secondary | ICD-10-CM | POA: Insufficient documentation

## 2019-10-20 ENCOUNTER — Inpatient Hospital Stay (HOSPITAL_BASED_OUTPATIENT_CLINIC_OR_DEPARTMENT_OTHER): Payer: Medicare Other | Admitting: Oncology

## 2019-10-20 ENCOUNTER — Other Ambulatory Visit: Payer: Self-pay

## 2019-10-20 ENCOUNTER — Encounter: Payer: Self-pay | Admitting: Oncology

## 2019-10-20 ENCOUNTER — Inpatient Hospital Stay: Payer: Medicare Other | Attending: Oncology

## 2019-10-20 VITALS — BP 126/82 | HR 63 | Temp 96.5°F | Resp 18 | Wt 165.9 lb

## 2019-10-20 DIAGNOSIS — Z95828 Presence of other vascular implants and grafts: Secondary | ICD-10-CM | POA: Diagnosis not present

## 2019-10-20 DIAGNOSIS — I2510 Atherosclerotic heart disease of native coronary artery without angina pectoris: Secondary | ICD-10-CM | POA: Insufficient documentation

## 2019-10-20 DIAGNOSIS — J449 Chronic obstructive pulmonary disease, unspecified: Secondary | ICD-10-CM | POA: Diagnosis not present

## 2019-10-20 DIAGNOSIS — R748 Abnormal levels of other serum enzymes: Secondary | ICD-10-CM | POA: Diagnosis not present

## 2019-10-20 DIAGNOSIS — Z85118 Personal history of other malignant neoplasm of bronchus and lung: Secondary | ICD-10-CM | POA: Insufficient documentation

## 2019-10-20 DIAGNOSIS — C3431 Malignant neoplasm of lower lobe, right bronchus or lung: Secondary | ICD-10-CM | POA: Diagnosis not present

## 2019-10-20 DIAGNOSIS — R5383 Other fatigue: Secondary | ICD-10-CM | POA: Diagnosis not present

## 2019-10-20 DIAGNOSIS — Z5112 Encounter for antineoplastic immunotherapy: Secondary | ICD-10-CM

## 2019-10-20 LAB — CBC WITH DIFFERENTIAL/PLATELET
Abs Immature Granulocytes: 0.04 10*3/uL (ref 0.00–0.07)
Basophils Absolute: 0 10*3/uL (ref 0.0–0.1)
Basophils Relative: 1 %
Eosinophils Absolute: 0.1 10*3/uL (ref 0.0–0.5)
Eosinophils Relative: 2 %
HCT: 45.1 % (ref 39.0–52.0)
Hemoglobin: 15.2 g/dL (ref 13.0–17.0)
Immature Granulocytes: 1 %
Lymphocytes Relative: 20 %
Lymphs Abs: 1.1 10*3/uL (ref 0.7–4.0)
MCH: 31.6 pg (ref 26.0–34.0)
MCHC: 33.7 g/dL (ref 30.0–36.0)
MCV: 93.8 fL (ref 80.0–100.0)
Monocytes Absolute: 0.6 10*3/uL (ref 0.1–1.0)
Monocytes Relative: 11 %
Neutro Abs: 3.6 10*3/uL (ref 1.7–7.7)
Neutrophils Relative %: 65 %
Platelets: 170 10*3/uL (ref 150–400)
RBC: 4.81 MIL/uL (ref 4.22–5.81)
RDW: 13.2 % (ref 11.5–15.5)
WBC: 5.5 10*3/uL (ref 4.0–10.5)
nRBC: 0 % (ref 0.0–0.2)

## 2019-10-20 LAB — COMPREHENSIVE METABOLIC PANEL
ALT: 22 U/L (ref 0–44)
AST: 17 U/L (ref 15–41)
Albumin: 4 g/dL (ref 3.5–5.0)
Alkaline Phosphatase: 126 U/L (ref 38–126)
Anion gap: 7 (ref 5–15)
BUN: 11 mg/dL (ref 8–23)
CO2: 29 mmol/L (ref 22–32)
Calcium: 8.9 mg/dL (ref 8.9–10.3)
Chloride: 103 mmol/L (ref 98–111)
Creatinine, Ser: 1.15 mg/dL (ref 0.61–1.24)
GFR calc Af Amer: >60 mL/min (ref >60)
GFR calc non Af Amer: >60 mL/min (ref >60)
Glucose, Bld: 122 mg/dL — ABNORMAL HIGH (ref 70–99)
Potassium: 4.4 mmol/L (ref 3.5–5.1)
Sodium: 139 mmol/L (ref 135–145)
Total Bilirubin: 0.7 mg/dL (ref 0.3–1.2)
Total Protein: 6.9 g/dL (ref 6.5–8.1)

## 2019-10-20 LAB — TSH: TSH: 1.452 u[IU]/mL (ref 0.350–4.500)

## 2019-10-20 NOTE — Progress Notes (Signed)
Patient here for follow up. No new concerns voiced.  °

## 2019-10-20 NOTE — Progress Notes (Signed)
Hematology/Oncology follow up note [facility] Telephone:(336) [phone] Fax:(336) [phone]   Patient Care Team: Baxter Hire, MD as PCP - General (Internal Medicine) Sherren Mocha, MD as PCP - Cardiology (Cardiology) Telford Nab, RN as Registered Nurse  REFERRING PROVIDER: Dr.Fleming REASON FOR VISIT:  Follow-up for management of stage III non-small cell lung cancer   HISTORY OF PRESENTING ILLNESS:  Timothy Yoder is a  64 y.o.  male with PMH listed below who was referred to me for evaluation of lung mass.  Patient is a former smoker quit smoking 7 years ago.  40-pack-year. Patient chest lung cancer screening on 03/11/2018. CT scan showed 2.4 cm right lower lobe nodule with a new necrotic.  Subcarinal lymph node. PET scan showed hypermetabolic right lower pulmonary nodule, consistent with primary bronchogenic carcinoma.  Fluid density subcarinal structure demonstrate no significant hypermetabolism.  Although this could represent an incidental benign lesion such as a cyst given the interval development since 02/24/2017, necrotic adenopathy cannot be excluded and tissue sampling should be considered.  # underwent biopsy via bronchoscopy.  Subcarinal lymph node biopsy positive for non-small cell lung cancer, favoring poorly differentiated adenocarcinoma.  Stage IIIA T1c N2M0 #Mediport was placed by Dr. Genevive Bi  # #Elevated alkaline phosphatase. Patient has been evaluated by gastroenterology.  Liver biopsy showed mild sinusoidal dilation and congestion.  Minimal changes suggestive of hepatic process. Iron stain demonstrate minimal focal hepatocellular iron. I discussed with Dr.Wohl via secure chat about patient's pathology result.  No restriction of proceeding with immunotherapy.  Continue watch for the labs.   # Cancer Treatment 04/26/2018 -06/11/2018 Concurrent Chemotherapy [weekly Carbo and Taxol] with RT.  07/10/2018 Started on maintenance Durvalumab. Finished  immunotherapy in May 2021 INTERVAL HISTORY Timothy Yoder is a 64 y.o. male who has above history reviewed by me today presents for follow up visit for management of stage III non-small cell lung cancer.   Chronic SOB, unchanged. Back on Anoro he follows up with pulmonology Fatigue is better.     Review of Systems  Constitutional: Negative for appetite change, chills, diaphoresis, fatigue, fever and unexpected weight change.  HENT:   Negative for hearing loss, lump/mass, nosebleeds, sore throat and voice change.   Eyes: Negative for eye problems and icterus.  Respiratory: Positive for shortness of breath. Negative for chest tightness, cough, hemoptysis and wheezing.   Cardiovascular: Negative for chest pain and leg swelling.  Gastrointestinal: Negative for abdominal distention, abdominal pain, blood in stool, diarrhea, nausea and rectal pain.  Endocrine: Negative for hot flashes.  Genitourinary: Negative for bladder incontinence, difficulty urinating, dysuria, frequency, hematuria and nocturia.   Musculoskeletal: Negative for arthralgias, back pain, flank pain, gait problem and myalgias.  Skin: Negative for itching and rash.  Neurological: Negative for dizziness, gait problem, headaches, light-headedness, numbness and seizures.  Hematological: Negative for adenopathy. Does not bruise/bleed easily.  Psychiatric/Behavioral: Negative for confusion and decreased concentration. The patient is not nervous/anxious.    MEDICAL HISTORY:  Past Medical History:  Diagnosis Date  . BPH (benign prostatic hyperplasia)   . CAD (coronary artery disease)    a. 04/2014 anterolateral STEMI/PCI: LM nl, LAD 4m(2.75x16 Promus DES, D1 50p, LCX nl, RI nl, OM1 100 small/chronic, RCA dominant, [id number](FFR 0.90->Med Rx), RPDA 50, RPLA 70, EF 60%.  . CMarland KitchenPD (chronic obstructive pulmonary disease) (HCC)    Dr FleRaul Del Former tobacco use   . Gastroesophageal reflux disease   . HTN (hypertension)   .  Hyperlipidemia   .  Hypokalemia   . Malignant neoplasm of lung ([place]) 04/01/2018  . Myocardial infarction ([place]) 04/2014   Dr Sherren Mocha  . Stented coronary artery     SURGICAL HISTORY: Past Surgical History:  Procedure Laterality Date  . CARDIAC CATHETERIZATION    . CHOLECYSTECTOMY N/A 02/15/2016   Procedure: LAPAROSCOPIC CHOLECYSTECTOMY WITH INTRAOPERATIVE CHOLANGIOGRAM;  Surgeon: Leonie Green, MD;  Location: [facility] ORS;  Service: General;  Laterality: N/A;  . COLONOSCOPY    . COLONOSCOPY WITH PROPOFOL N/A 08/02/2015   Procedure: COLONOSCOPY WITH PROPOFOL;  Surgeon: Manya Silvas, MD;  Location: [facility] ENDOSCOPY;  Service: Endoscopy;  Laterality: N/A;  . CORONARY ANGIOPLASTY  04/2014   STENT  . ENDOBRONCHIAL ULTRASOUND N/A 03/26/2018   Procedure: ENDOBRONCHIAL ULTRASOUND;  Surgeon: Flora Lipps, MD;  Location: [facility] ORS;  Service: Cardiopulmonary;  Laterality: N/A;  . ESOPHAGOGASTRODUODENOSCOPY (EGD) WITH PROPOFOL N/A 08/02/2015   Procedure: ESOPHAGOGASTRODUODENOSCOPY (EGD) WITH PROPOFOL;  Surgeon: Manya Silvas, MD;  Location: [facility] ENDOSCOPY;  Service: Endoscopy;  Laterality: N/A;  . FLEXIBLE BRONCHOSCOPY N/A 03/26/2018   Procedure: FLEXIBLE BRONCHOSCOPY;  Surgeon: Flora Lipps, MD;  Location: [facility] ORS;  Service: Cardiopulmonary;  Laterality: N/A;  . KNEE ARTHROSCOPY Left   . LEFT HEART CATHETERIZATION WITH CORONARY ANGIOGRAM N/A 05/03/2014   Procedure: LEFT HEART CATHETERIZATION WITH CORONARY ANGIOGRAM;  Surgeon: Blane Ohara, MD;  Location: [facility] CATH LAB;  Service: Cardiovascular;  Laterality: N/A;  . PERCUTANEOUS CORONARY STENT INTERVENTION (PCI-S) N/A 05/05/2014   Procedure: PERCUTANEOUS CORONARY STENT INTERVENTION (PCI-S);  Surgeon: Peter M Martinique, MD;  Location: [facility] CATH LAB;  Service: Cardiovascular;  Laterality: N/A;  . PORTACATH PLACEMENT Left 04/04/2018   Procedure: INSERTION PORT-A-CATH;  Surgeon: [name], MD;  Location: [facility] ORS;  Service: General;  Laterality:  Left;    SOCIAL HISTORY: Social History   Socioeconomic History  . Marital status: Married    Spouse name: Not on file  . Number of children: Not on file  . Years of education: Not on file  . Highest education level: Not on file  Occupational History  . Not on file  Tobacco Use  . Smoking status: Former Smoker    Packs/day: 1.00    Years: 40.00    Pack years: 40.00    Types: Cigarettes    Quit date: 02/14/2011    Years since quitting: 8.6  . Smokeless tobacco: Never Used  Vaping Use  . Vaping Use: Never used  Substance and Sexual Activity  . Alcohol use: No  . Drug use: No  . Sexual activity: Not on file  Other Topics Concern  . Not on file  Social History Narrative   The patient is married. He lives in [place]. He works full-time. He quit smoking in 2013.   Social Determinants of Health   Financial Resource Strain:   . Difficulty of Paying Living Expenses:   Food Insecurity:   . Worried About Charity fundraiser in the Last Year:   . Arboriculturist in the Last Year:   Transportation Needs:   . Film/video editor (Medical):   Marland Kitchen Lack of Transportation (Non-Medical):   Physical Activity:   . Days of Exercise per Week:   . Minutes of Exercise per Session:   Stress:   . Feeling of Stress :   Social Connections:   . Frequency of Communication with Friends and Family:   . Frequency of Social Gatherings with Friends and Family:   . Attends Religious Services:   . Active  Member of Clubs or Organizations:   . Attends Archivist Meetings:   Marland Kitchen Marital Status:   Intimate Partner Violence:   . Fear of Current or Ex-Partner:   . Emotionally Abused:   Marland Kitchen Physically Abused:   . Sexually Abused:     FAMILY HISTORY: Family History  Problem Relation Age of Onset  . CAD Mother 26       Died of an MI  . CAD Father 70       Died of a massive MI  . COPD Sister   . Lung cancer Brother   . Bone cancer Paternal Uncle   . Colon cancer Neg Hx   . Breast  cancer Neg Hx     ALLERGIES:  has No Known Allergies.  MEDICATIONS:  Current Outpatient Medications  Medication Sig Dispense Refill  . acetaminophen (TYLENOL) 500 MG tablet Take 1,000 mg by mouth every 6 (six) hours as needed (for pain.).    Marland Kitchen albuterol (PROAIR HFA) 108 (90 Base) MCG/ACT inhaler Inhale 2 puffs into the lungs every 6 (six) hours as needed for wheezing or shortness of breath. 18 g 5  . aspirin EC 81 MG tablet Take 81 mg by mouth daily.    Marland Kitchen atorvastatin (LIPITOR) 80 MG tablet Take 1 tablet (80 mg total) by mouth daily at 6 PM. 90 tablet 3  . carvedilol (COREG) 6.25 MG tablet Take 1 tablet (6.25 mg total) by mouth 2 (two) times daily. Please keep upcoming appt in May with Dr. Burt Knack before anymore refills. Thank you 180 tablet 0  . finasteride (PROSCAR) 5 MG tablet Take by mouth.    . lidocaine-prilocaine (EMLA) cream Apply small amount to port site 1 hour prior to [facility] being accessed and cover with plastic wrap 30 g 1  . nitroGLYCERIN (NITROSTAT) 0.4 MG SL tablet Place 1 tablet (0.4 mg total) under the tongue every 5 (five) minutes as needed for chest pain. X 3 doses 25 tablet 3  . omeprazole (PRILOSEC) 40 MG capsule Take 1 capsule (40 mg total) by mouth 2 (two) times daily. 180 capsule 1  . ondansetron (ZOFRAN) 8 MG tablet PLEASE SEE ATTACHED FOR DETAILED DIRECTIONS    . tamsulosin (FLOMAX) 0.4 MG CAPS capsule Take 0.4 mg by mouth daily after supper.    . umeclidinium-vilanterol (ANORO ELLIPTA) 62.5-25 MCG/INH AEPB Inhale 1 puff into the lungs daily. 1 each 5   No current facility-administered medications for this visit.     PHYSICAL EXAMINATION: ECOG PERFORMANCE STATUS: 1 - Symptomatic but completely ambulatory Vitals:   10/20/19 0936  BP: 126/82  Pulse: 63  Resp: 18  Temp: (!) 96.5 F (35.8 C)  SpO2: 98%   Filed Weights   10/20/19 0936  Weight: 165 lb 14.4 oz (75.3 kg)    Physical Exam Constitutional:      General: He is not in acute distress. HENT:      Head: Normocephalic and atraumatic.  Eyes:     General: No scleral icterus. Cardiovascular:     Rate and Rhythm: Normal rate and regular rhythm.     Heart sounds: Normal heart sounds.  Pulmonary:     Effort: Pulmonary effort is normal. No respiratory distress.     Breath sounds: No wheezing.  Abdominal:     General: Bowel sounds are normal. There is no distension.     Palpations: Abdomen is soft.  Musculoskeletal:        General: No deformity. Normal range of motion.  Cervical back: Normal range of motion and neck supple.  Skin:    General: Skin is warm and dry.     Findings: No erythema or rash.  Neurological:     Mental Status: He is alert and oriented to person, place, and time. Mental status is at baseline.     Cranial Nerves: No cranial nerve deficit.     Coordination: Coordination normal.  Psychiatric:        Mood and Affect: Mood normal.      LABORATORY DATA:  I have reviewed the data as listed Lab Results  Component Value Date   WBC 5.5 10/20/2019   HGB 15.2 10/20/2019   HCT 45.1 10/20/2019   MCV 93.8 10/20/2019   PLT 170 10/20/2019   Recent Labs    08/11/19 0843 08/26/19 0803 09/09/19 0833  NA 139 136 138  K 3.9 3.9 3.7  CL 106 103 105  CO2 _0 GLUCOSE 105* 114* 104*  BUN 7* 10 10  CREATININE 1.11 1.04 1.05  CALCIUM 8.7* 8.6* 8.4*  GFRNONAA >60 >60 >60  GFRAA >60 >60 >60  PROT 6.6 6.6 6.6  ALBUMIN 3.7 3.8 3.7  AST _1 ALT _2 ALKPHOS 156* 144* 130*  BILITOT 0.7 1.0 0.6   Iron/TIBC/Ferritin/ %Sat    Component Value Date/Time   IRON 41 (L) 11/21/2018 0856   TIBC 260 11/21/2018 0856   FERRITIN 150 11/21/2018 0856   IRONPCTSAT 16 (L) 11/21/2018 0856    RADIOGRAPHIC STUDIES: I have personally reviewed the radiological images as listed and agreed with the findings in the report.  CT CHEST WO CONTRAST  Result Date: 10/17/2019 CLINICAL DATA:  Restaging of right lower lobe lung cancer. Completed chemotherapy, completed  immunotherapy 09/09/2019. Chronic shortness breath. EXAM: CT CHEST WITHOUT CONTRAST TECHNIQUE: Multidetector CT imaging of the chest was performed following the standard protocol without IV contrast. COMPARISON:  07/09/2019 FINDINGS: Cardiovascular: Left Port-A-Cath tip: SVC. Coronary, aortic arch, and branch vessel atherosclerotic vascular disease. Mediastinum/Nodes: Unremarkable Lungs/Pleura: Centrilobular emphysema. Biapical pleuroparenchymal scarring. Stable scattered bandlike scarring in both lungs. In the right upper lobe, a faint band of scarring has adjacent nodularity on image 73/3 measuring 0.4 by 0.3 cm, this component of nodularity is new from the prior exam, and while possibly inflammatory this merits surveillance. Along the minor fissure, a 0.5 by 0.4 cm nodule on image 89/3 was previously 0.4 by 0.3 cm. This has mildly enlarged A bandlike density in the right lower lobe likely primarily rated representing radiation therapy is observed with a maximum thickening of the bandlike region of 1.2 cm on image 122/3, formerly 1.3 cm. This extends down to the base of the lung. There is mild adjacent scarring noted. A well-defined separate nodule or significant abnormal changing contour is not identified compared to previous. Old granulomatous disease noted. For example calcified granulomas are present in the lingula. Upper Abdomen: Stable hypodensity in the dome of the right hepatic lobe on image 128/2, probably a cyst or similar benign lesion. Cholecystectomy. Transverse duodenal diverticula noted. Musculoskeletal: Unremarkable IMPRESSION: 1. Stable appearance of the bandlike density in the right lower lobe, likely primarily rated representing radiation therapy related findings. No findings of active/recurrent malignancy. 2. A 0.4 by 0.3 cm nodule along the previous mild bandlike scarring in the right upper lobe is new from the prior exam, and while possibly inflammatory this merits surveillance. 3. Slight  enlargement of a small nodular density along the minor fissure, previously 0.4  by 0.3 cm and currently 0.5 by 0.4 cm. This may simply represent a benign subpleural lymph node but merits surveillance. 4. Other imaging findings of potential clinical significance: Coronary, aortic arch, and branch vessel atherosclerotic vascular disease. Old granulomatous disease. Stable hypodensity in the dome of the right hepatic lobe, probably a cyst or similar benign lesion. Transverse duodenal diverticula. 5. Emphysema and aortic atherosclerosis. Aortic Atherosclerosis (ICD10-I70.0) and Emphysema (ICD10-J43.9). Electronically Signed   By: Van Clines M.D.   On: 10/17/2019 12:15   ECHOCARDIOGRAM COMPLETE  Result Date: 09/01/2019    ECHOCARDIOGRAM REPORT   Patient Name:   Timothy Yoder Date of Exam: 09/01/2019 Medical Rec #:  [id number]      Height:       68.0 in Accession #:    [id number]     Weight:       162.7 lb Date of Birth:  [birth year]       BSA:          1.872 m Patient Age:    64 years       BP:           142/82 mmHg Patient Gender: M              HR:           63 bpm. Exam Location:  [facility] Procedure: 2D Echo, Cardiac Doppler and Color Doppler Indications:    R06.02  History:        Patient has no prior history of Echocardiogram examinations. CAD                 and Previous Myocardial Infarction, COPD,                 Signs/Symptoms:Shortness of Breath; Risk Factors:Hypertension,                 Dyslipidemia and Former Smoker.  Sonographer:    Coralyn Helling RDCS Referring Phys: [place]  1. Left ventricular ejection fraction, by estimation, is 55 to 60%. The left ventricle has normal function. The left ventricle has no regional wall motion abnormalities. Left ventricular diastolic parameters were normal.  2. Right ventricular systolic function is normal. The right ventricular size is normal.  3. The mitral valve is normal in structure. Trivial mitral valve regurgitation. No  evidence of mitral stenosis.  4. The aortic valve is normal in structure. Aortic valve regurgitation is trivial. No aortic stenosis is present.  5. The inferior vena cava is normal in size with greater than 50% respiratory variability, suggesting right atrial pressure of 3 mmHg. FINDINGS  Left Ventricle: Left ventricular ejection fraction, by estimation, is 55 to 60%. The left ventricle has normal function. The left ventricle has no regional wall motion abnormalities. The left ventricular internal cavity size was normal in size. There is  no left ventricular hypertrophy of the basal-septal segment. Left ventricular diastolic parameters were normal. Normal left ventricular filling pressure. Right Ventricle: The right ventricular size is normal. No increase in right ventricular wall thickness. Right ventricular systolic function is normal. Left Atrium: Left atrial size was normal in size. Right Atrium: Right atrial size was normal in size. Pericardium: There is no evidence of pericardial effusion. Mitral Valve: The mitral valve is normal in structure. Normal mobility of the mitral valve leaflets. Trivial mitral valve regurgitation. No evidence of mitral valve stenosis. Tricuspid Valve: The tricuspid valve is normal in structure. Tricuspid valve regurgitation is trivial. No evidence of tricuspid  stenosis. Aortic Valve: The aortic valve is normal in structure. Aortic valve regurgitation is trivial. No aortic stenosis is present. Pulmonic Valve: The pulmonic valve was normal in structure. Pulmonic valve regurgitation is not visualized. No evidence of pulmonic stenosis. Aorta: The aortic root is normal in size and structure. Venous: The inferior vena cava is normal in size with greater than 50% respiratory variability, suggesting right atrial pressure of 3 mmHg. IAS/Shunts: The interatrial septum appears to be lipomatous. No atrial level shunt detected by color flow Doppler.  LEFT VENTRICLE PLAX 2D LVIDd:         4.10 cm   Diastology LVIDs:         2.90 cm  LV e' lateral:   10.60 cm/s LV PW:         1.00 cm  LV E/e' lateral: 8.5 LV IVS:        1.20 cm  LV e' medial:    6.31 cm/s LVOT diam:     2.00 cm  LV E/e' medial:  14.3 LV SV:         61 LV SV Index:   33 LVOT Area:     3.14 cm  RIGHT VENTRICLE             IVC RV S prime:     10.80 cm/s  IVC diam: 1.00 cm TAPSE (M-mode): 2.0 cm LEFT ATRIUM             Index       RIGHT ATRIUM           Index LA diam:        3.50 cm 1.87 cm/m  RA Pressure: 3.00 mmHg LA Vol (A2C):   35.6 ml 19.02 ml/m RA Area:     12.70 cm LA Vol (A4C):   24.8 ml 13.25 ml/m RA Volume:   29.60 ml  15.81 ml/m LA Biplane Vol: 30.9 ml 16.51 ml/m  AORTIC VALVE LVOT Vmax:   83.60 cm/s LVOT Vmean:  54.000 cm/s LVOT VTI:    0.194 m  AORTA Ao Root diam: 3.40 cm Ao Asc diam:  3.60 cm MV E velocity: 90.00 cm/s   TRICUSPID VALVE MV A velocity: 105.00 cm/s  Estimated RAP:  3.00 mmHg MV E/A ratio:  0.86                             SHUNTS                             Systemic VTI:  0.19 m                             Systemic Diam: 2.00 cm Fransico Him MD Electronically signed by Fransico Him MD Signature Date/Time: 09/01/2019/8:27:14 AM    Final    Pulmonary Function Test [facility] Only  Result Date: 10/06/2019 Spirometry Data Is Acceptable and Reproducible Moderate/Severe  Obstructive Airways Disease without  Significant Broncho-Dilator Response +air trapping and +hyperinflation Consider outpatient Pulmonary Consultation if needed Clinical Correlation Advised     ASSESSMENT & PLAN:  1. Malignant neoplasm of lower lobe of right lung ([place])   2. Port-A-Cath in place   3. Chronic obstructive pulmonary disease, unspecified COPD type ([place])   Cancer Staging Malignant neoplasm of lower lobe of right lung ([place]) Staging form: Lung, AJCC 8th Edition - Clinical stage from  05/13/2018: Stage IIIA (cT1c, cN2, cM0) - Signed by Earlie Server, MD on 05/13/2018  #Stage IIIA lung adenocarcinoma Labs are reviewed and discussed with  patient. CT images were independantly reviewed by me and discussed with patient.  Overall he has been in remission.  Small new 0.4x 0.3cm nodule in RUL which is new and slightly increased nodular density along minor fissure. Likely inflammatory process, warrants short term follow up .  Repeat CT chest w contrast in 3 months.   # COPD follow up with pulmonology  #Port-A-Cath in place, patient plans to keep his Mediport and I recommend continue port flush. every 6 to 8 weeks. #Chronically elevated alkaline phosphatase, ALK has normalized now after finished immunotherapy.   Follow up in 3 months.  Earlie Server, MD, PhD 10/20/19

## 2019-11-04 ENCOUNTER — Inpatient Hospital Stay: Payer: Medicare Other | Attending: Oncology

## 2019-11-04 ENCOUNTER — Other Ambulatory Visit: Payer: Self-pay

## 2019-11-04 DIAGNOSIS — Z452 Encounter for adjustment and management of vascular access device: Secondary | ICD-10-CM | POA: Insufficient documentation

## 2019-11-04 DIAGNOSIS — Z95828 Presence of other vascular implants and grafts: Secondary | ICD-10-CM

## 2019-11-04 DIAGNOSIS — C3431 Malignant neoplasm of lower lobe, right bronchus or lung: Secondary | ICD-10-CM | POA: Insufficient documentation

## 2019-11-04 MED ORDER — HEPARIN SOD (PORK) LOCK FLUSH 100 UNIT/ML IV SOLN
500.0000 [IU] | Freq: Once | INTRAVENOUS | Status: AC
  Administered 2019-11-04: 500 [IU] via INTRAVENOUS
  Filled 2019-11-04: qty 5

## 2019-11-04 MED ORDER — SODIUM CHLORIDE 0.9% FLUSH
10.0000 mL | Freq: Once | INTRAVENOUS | Status: AC
  Administered 2019-11-04: 10 mL via INTRAVENOUS
  Filled 2019-11-04: qty 10

## 2019-12-04 IMAGING — DX DG CHEST 1V PORT
1 series · 1 of 1 positions shown · non-contrast
Comparison: None.

CLINICAL DATA: Postop port placement

EXAM:
PORTABLE CHEST 1 VIEW

[chest ap]
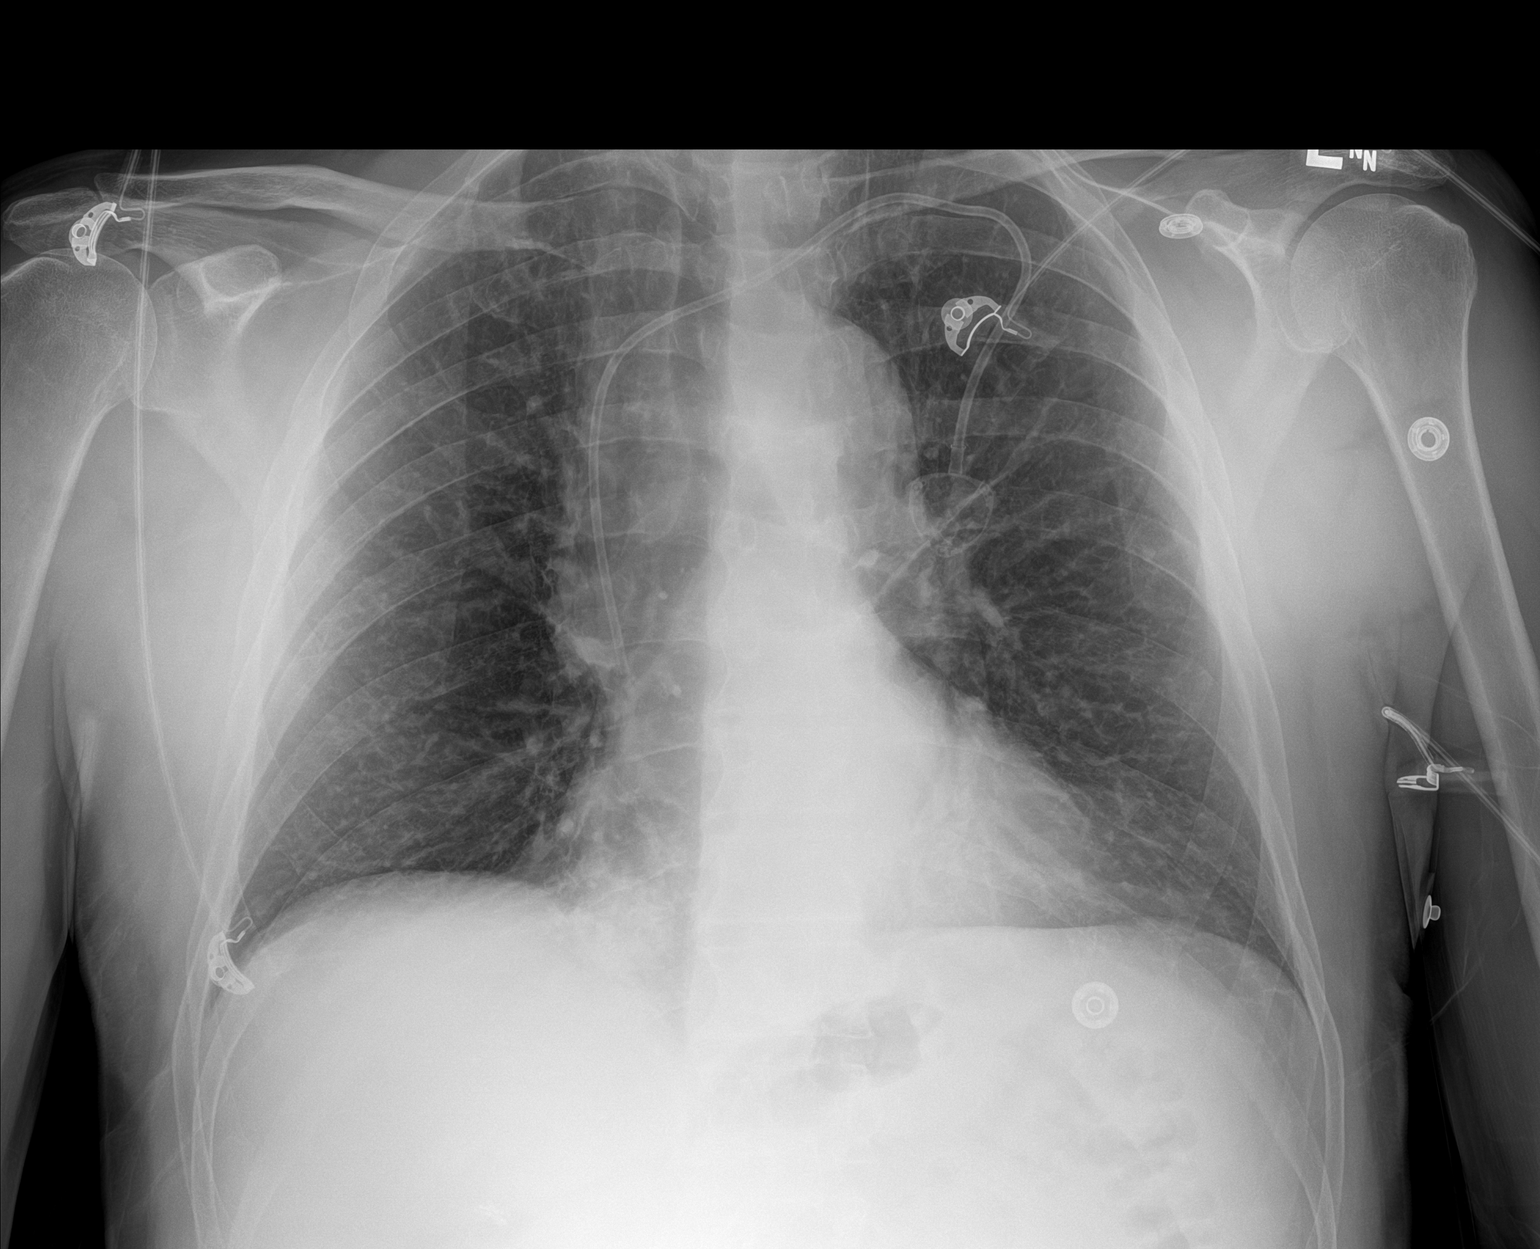

[1 of 1 positions shown; findings below may reference images not displayed]

FINDINGS: There is a right-sided Port-A-Cath with the tip projecting over the
SVC. There is no focal parenchymal opacity. There is no pleural
effusion or pneumothorax. The heart and mediastinal contours are
unremarkable.

The osseous structures are unremarkable.
IMPRESSION: Right-sided Port-A-Cath with the tip projecting over the SVC.

## 2019-12-09 ENCOUNTER — Ambulatory Visit (INDEPENDENT_AMBULATORY_CARE_PROVIDER_SITE_OTHER): Payer: Medicare Other | Admitting: Adult Health

## 2019-12-09 ENCOUNTER — Encounter: Payer: Self-pay | Admitting: Adult Health

## 2019-12-09 ENCOUNTER — Other Ambulatory Visit: Payer: Self-pay

## 2019-12-09 DIAGNOSIS — C3431 Malignant neoplasm of lower lobe, right bronchus or lung: Secondary | ICD-10-CM

## 2019-12-09 DIAGNOSIS — I25119 Atherosclerotic heart disease of native coronary artery with unspecified angina pectoris: Secondary | ICD-10-CM

## 2019-12-09 DIAGNOSIS — J449 Chronic obstructive pulmonary disease, unspecified: Secondary | ICD-10-CM

## 2019-12-09 NOTE — Progress Notes (Signed)
@Patient  ID: Timothy Yoder, male    DOB: 11/27/1955, 64 y.o.   MRN: [id number]  Chief Complaint  Patient presents with  . Follow-up    COPD     Referring provider: Baxter Hire, MD  HPI: 64 year old male former smoker quit in 2012 followed for moderate COPD, lung cancer adenocarcinoma stage IIIa right lower lobe diagnosed in December 2019  TEST/EVENTS :  03/11/2018 CT chest lung screen 1. New 2.4 cm right lower lobe nodule with a new necrotic appearing subcarinal lymph node. Lung-RADS 4B, suspicious. Additional imaging evaluation or consultation with Pulmonology or Thoracic Surgery recommended. These results will be called to the ordering clinician or representative by the Radiologist Assistant, and communication documented in the PACS or zVision Dashboard. 2. Aortic atherosclerosis (ICD10-170.0). Coronary arterycalcification.3. Emphysema (ICD10-J43.9).  03/13/2018 PET scan #Hypermetabolic right lower lobe pulmonary nodule, consistent with primary bronchogenic carcinoma. Fluid density subcarinal structure demonstrates no significant hypermetabolism. Although this could represent an incidental benign lesion such as a bronchogenic cyst, given interval development since 02/24/2017, necrotic adenopathy cannot be excluded and tissue sampling should be considered.  12/09/2019 Follow up : COPD and Lung Cancer  Patient returns for a 58-month follow-up.  Patient has underlying moderate COPD.  Previous pulmonary function testing showed moderate airflow obstruction with severe diffusing defect.  FEV1 was 56%, ratio 53, FVC 79%, no significant bronchodilator response.  DLCO was 41%.  Last visit patient had been changed to [employer] due to formulary changes.  He did not feel like this worked quite as well as [employer] and was changed back to [employer].  Since last visit patient says he is feeling better , more energy . Works part-time. Walking more. No increased cough or congestion  Does feel Timothy Yoder is  working well.   Patient has known history of lung cancer adenocarcinoma stage IIIa right lower lobe diagnoses in December 2019.  He underwent concurrent chemotherapy and XRT.  He was started on durvalumab from March 2020 until May 2021.  CT chest October 17, 2019 showed stable appearance of the bandlike density in the right lower lobe likely representing radiation therapy.  No findings of active or recurrent malignancy.  There was a notable new 0.4 x 0.3 cm nodule along the previous mild bandlike scarring in the right upper lobe that is new from previous exam.  In the slight enlargement along the nodular density along the minor fissure.  Measuring 0.5 cm.  Previously 0.4.  He has followed up with oncology with and has short-term follow-up with CT chest in 3 months. No weight loss or hemoptysis   covid vaccine utd.    No Known Allergies  Immunization History  Administered Date(s) Administered  . Influenza,inj,Quad PF,6+ Mos 05/24/2016, 01/27/2019  . Influenza,inj,quad, With Preservative 02/25/2018  . Influenza-Unspecified 02/26/2017, 02/26/2018  . PFIZER SARS-COV-2 Vaccination 06/05/2019, 07/01/2019  . Tdap 06/05/2014  . Zoster 05/25/2010    Past Medical History:  Diagnosis Date  . BPH (benign prostatic hyperplasia)   . CAD (coronary artery disease)    a. 04/2014 anterolateral STEMI/PCI: LM nl, LAD 53m (2.75x16 Promus DES, D1 50p, LCX nl, RI nl, OM1 100 small/chronic, RCA dominant, 20-32m, 46m/d (FFR 0.90->Med Rx), RPDA 50, RPLA 70, EF 60%.  Marland Kitchen COPD (chronic obstructive pulmonary disease) (HCC)    Dr Timothy Yoder  . Former tobacco use   . Gastroesophageal reflux disease   . HTN (hypertension)   . Hyperlipidemia   . Hypokalemia   . Malignant neoplasm of lung ([place]) 04/01/2018  .  Myocardial infarction ([place]) 04/2014   Dr Timothy Yoder  . Stented coronary artery     Tobacco History: Social History   Tobacco Use  Smoking Status Former Smoker  . Packs/day: 1.00  . Years: 40.00  . Pack  years: 40.00  . Types: Cigarettes  . Quit date: 02/14/2011  . Years since quitting: 8.8  Smokeless Tobacco Never Used   Counseling given: Not Answered   Outpatient Medications Prior to Visit  Medication Sig Dispense Refill  . acetaminophen (TYLENOL) 500 MG tablet Take 1,000 mg by mouth every 6 (six) hours as needed (for pain.).    Marland Kitchen albuterol (PROAIR HFA) 108 (90 Base) MCG/ACT inhaler Inhale 2 puffs into the lungs every 6 (six) hours as needed for wheezing or shortness of breath. 18 g 5  . aspirin EC 81 MG tablet Take 81 mg by mouth daily.    Marland Kitchen atorvastatin (LIPITOR) 80 MG tablet Take 1 tablet (80 mg total) by mouth daily at 6 PM. 90 tablet 3  . carvedilol (COREG) 6.25 MG tablet Take 1 tablet (6.25 mg total) by mouth 2 (two) times daily. Please keep upcoming appt in May with Dr. Burt Yoder before anymore refills. Thank you 180 tablet 0  . finasteride (PROSCAR) 5 MG tablet Take by mouth.    . lidocaine-prilocaine (EMLA) cream Apply small amount to port site 1 hour prior to [facility] being accessed and cover with plastic wrap 30 g 1  . nitroGLYCERIN (NITROSTAT) 0.4 MG SL tablet Place 1 tablet (0.4 mg total) under the tongue every 5 (five) minutes as needed for chest pain. X 3 doses 25 tablet 3  . omeprazole (PRILOSEC) 40 MG capsule Take 1 capsule (40 mg total) by mouth 2 (two) times daily. 180 capsule 1  . ondansetron (ZOFRAN) 8 MG tablet PLEASE SEE ATTACHED FOR DETAILED DIRECTIONS    . tamsulosin (FLOMAX) 0.4 MG CAPS capsule Take 0.4 mg by mouth daily after supper.    . umeclidinium-vilanterol (ANORO ELLIPTA) 62.5-25 MCG/INH AEPB Inhale 1 puff into the lungs daily. 1 each 5   No facility-administered medications prior to visit.     Review of Systems:   Constitutional:   No  weight loss, night sweats,  Fevers, chills, + fatigue, or  lassitude.  HEENT:   No headaches,  Difficulty swallowing,  Tooth/dental problems, or  Sore throat,                No sneezing, itching, ear ache, nasal  congestion, post nasal drip,   CV:  No chest pain,  Orthopnea, PND, swelling in lower extremities, anasarca, dizziness, palpitations, syncope.   GI  No heartburn, indigestion, abdominal pain, nausea, vomiting, diarrhea, change in bowel habits, loss of appetite, bloody stools.   Resp:  No excess mucus, no productive cough,  No non-productive cough,  No coughing up of blood.  No change in color of mucus.  No wheezing.  No chest wall deformity  Skin: no rash or lesions.  GU: no dysuria, change in color of urine, no urgency or frequency.  No flank pain, no hematuria   MS:  No joint pain or swelling.  No decreased range of motion.  No back pain.    Physical Exam  BP 128/80 (BP Location: Left Arm, Cuff Size: Normal)   Pulse 87   Temp 97.7 F (36.5 C) (Temporal)   Ht 5\' 8"  (1.727 m)   Wt 164 lb 3.2 oz (74.5 kg)   SpO2 97%   BMI 24.97 kg/m  GEN: A/Ox3; pleasant , NAD, well nourished    HEENT:  [place]/AT,   NOSE-clear, THROAT-clear, no lesions, no postnasal drip or exudate noted.   NECK:  Supple w/ fair ROM; no JVD; normal carotid impulses w/o bruits; no thyromegaly or nodules palpated; no lymphadenopathy.    RESP  Clear  P & A; w/o, wheezes/ rales/ or rhonchi. no accessory muscle use, no dullness to percussion  CARD:  RRR, no m/r/g, no peripheral edema, pulses intact, no cyanosis or clubbing.  GI:   Soft & nt; nml bowel sounds; no organomegaly or masses detected.   Musco: Warm bil, no deformities or joint swelling noted.   Neuro: alert, no focal deficits noted.    Skin: Warm, no lesions or rashes    Lab Results:  CBC  BMET  Imaging: No results found.  heparin lock flush 100 unit/mL    Date Action Dose Route User   11/04/2019 1319 Given  Intravenous Lazarus Salines, RN    sodium chloride flush (NS) 0.9 % injection 10 mL    Date Action Dose Route User   11/04/2019 1319 Given  Intravenous Lazarus Salines, RN      No flowsheet data found.  No results found  for: NITRICOXIDE      Assessment & Plan:   COPD (chronic obstructive pulmonary disease) (HCC) Improved symptom control on [facility]   Plan  Patient Instructions  Continue on ANORO 1 puff daily , rinse after use.  Continue activity as tolerated.  High protein diet.  Follow up with Oncology as planned  Follow up with Dr. Mortimer Fries in 6 months and As needed        Malignant neoplasm of lower lobe of right lung [facility]) Continue on current f/up with Oncology and planned short-term follow up CT as planned in September . New small nodule in RUL and slight growth minor fissue nodule .   Plan  Patient Instructions  Continue on ANORO 1 puff daily , rinse after use.  Continue activity as tolerated.  High protein diet.  Follow up with Oncology as planned  Follow up with Dr. Mortimer Fries in 6 months and As needed           Rexene Edison, NP 12/09/2019

## 2019-12-09 NOTE — Assessment & Plan Note (Signed)
Improved symptom control on [facility]   Plan  Patient Instructions  Continue on ANORO 1 puff daily , rinse after use.  Continue activity as tolerated.  High protein diet.  Follow up with Oncology as planned  Follow up with Dr. Mortimer Fries in 6 months and As needed

## 2019-12-09 NOTE — Assessment & Plan Note (Signed)
Continue on current f/up with Oncology and planned short-term follow up CT as planned in September . New small nodule in RUL and slight growth minor fissue nodule .   Plan  Patient Instructions  Continue on ANORO 1 puff daily , rinse after use.  Continue activity as tolerated.  High protein diet.  Follow up with Oncology as planned  Follow up with Dr. Mortimer Fries in 6 months and As needed

## 2019-12-09 NOTE — Patient Instructions (Signed)
Continue on ANORO 1 puff daily , rinse after use.  Continue activity as tolerated.  High protein diet.  Follow up with Oncology as planned  Follow up with Dr. Mortimer Fries in 6 months and As needed

## 2019-12-15 ENCOUNTER — Other Ambulatory Visit: Payer: Self-pay | Admitting: Cardiovascular Disease

## 2019-12-30 ENCOUNTER — Inpatient Hospital Stay: Payer: Medicare Other | Attending: Oncology

## 2019-12-30 ENCOUNTER — Other Ambulatory Visit: Payer: Self-pay

## 2019-12-30 DIAGNOSIS — C3431 Malignant neoplasm of lower lobe, right bronchus or lung: Secondary | ICD-10-CM | POA: Insufficient documentation

## 2019-12-30 DIAGNOSIS — Z452 Encounter for adjustment and management of vascular access device: Secondary | ICD-10-CM | POA: Insufficient documentation

## 2019-12-30 DIAGNOSIS — Z95828 Presence of other vascular implants and grafts: Secondary | ICD-10-CM

## 2019-12-30 MED ORDER — HEPARIN SOD (PORK) LOCK FLUSH 100 UNIT/ML IV SOLN
500.0000 [IU] | Freq: Once | INTRAVENOUS | Status: AC
  Administered 2019-12-30: 500 [IU] via INTRAVENOUS
  Filled 2019-12-30: qty 5

## 2019-12-30 MED ORDER — SODIUM CHLORIDE 0.9% FLUSH
10.0000 mL | INTRAVENOUS | Status: DC | PRN
  Administered 2019-12-30: 10 mL via INTRAVENOUS
  Filled 2019-12-30: qty 10

## 2019-12-30 MED ORDER — HEPARIN SOD (PORK) LOCK FLUSH 100 UNIT/ML IV SOLN
INTRAVENOUS | Status: AC
  Filled 2019-12-30: qty 5

## 2020-01-05 ENCOUNTER — Encounter: Payer: Self-pay | Admitting: Oncology

## 2020-01-09 ENCOUNTER — Encounter: Payer: Self-pay | Admitting: Radiation Oncology

## 2020-01-12 ENCOUNTER — Ambulatory Visit
Admission: RE | Admit: 2020-01-12 | Discharge: 2020-01-12 | Disposition: A | Discharge status: Home / Self Care | Payer: Medicare Other | Source: Ambulatory Visit | Attending: Radiation Oncology | Admitting: Radiation Oncology

## 2020-01-12 ENCOUNTER — Other Ambulatory Visit: Payer: Self-pay

## 2020-01-12 DIAGNOSIS — C3431 Malignant neoplasm of lower lobe, right bronchus or lung: Secondary | ICD-10-CM

## 2020-01-12 NOTE — Progress Notes (Signed)
Radiation Oncology Follow up Note  Name: Timothy Yoder   Date:   01/12/2020 MRN:  [id number] DOB: 07/12/1955    This 64 y.o. male presents to the clinic today for 6-month follow-up status post concurrent chemoradiation therapy for stage IIIa non-small cell lung cancer of the right lower lobe.  REFERRING PROVIDER: Baxter Hire, MD  HPI: Patient is a 64 year old male now out 18 months having completed concurrent chemoradiation therapy for stage IIIa (T2 N2 M0) non-small cell lung cancer of the right lower lobe seen today in routine follow-up he is doing well.  He specifically denies cough hemoptysis chest tightness or any worsening of his pulmonary status..  He had a CT scan back in June which shows stable appearance of a bandlike density in the right lower lobe as well as a 0.4 cm nodule along the previous mild bandlike scarring in the right upper lobe which was new from prior examination is being followed.  He has a follow-up CT scan next week and follow-up appointments with Dr. Tasia Catchings.  COMPLICATIONS OF TREATMENT: none  FOLLOW UP COMPLIANCE: keeps appointments   PHYSICAL EXAM:  There were no vitals taken for this visit. Well-developed well-nourished patient in NAD. HEENT reveals PERLA, EOMI, discs not visualized.  Oral cavity is clear. No oral mucosal lesions are identified. Neck is clear without evidence of cervical or supraclavicular adenopathy. Lungs are clear to A&P. Cardiac examination is essentially unremarkable with regular rate and rhythm without murmur rub or thrill. Abdomen is benign with no organomegaly or masses noted. Motor sensory and DTR levels are equal and symmetric in the upper and lower extremities. Cranial nerves II through XII are grossly intact. Proprioception is intact. No peripheral adenopathy or edema is identified. No motor or sensory levels are noted. Crude visual fields are within normal range.  RADIOLOGY RESULTS: CT scans reviewed and compatible with above-stated  findings  PLAN: Present time patient is doing well he is asymptomatic with stable CT scan.  They are following this 0.4 cm nodule in the right upper lobe with a follow-up CT scan should this be progressing I would be happy to reevaluate the patient for possible treatment.  Patient will copy me on his CT scan and has follow-up appointments after the scan with Dr. Tasia Catchings.  Of otherwise asked to see him back in 6 months for follow-up.  Patient knows to call with any concerns.  I would like to take this opportunity to thank you for allowing me to participate in the care of your patient.Noreene Filbert, MD

## 2020-01-14 ENCOUNTER — Encounter: Payer: Self-pay | Admitting: Oncology

## 2020-01-14 ENCOUNTER — Telehealth: Payer: Self-pay | Admitting: *Deleted

## 2020-01-14 ENCOUNTER — Other Ambulatory Visit: Payer: Self-pay | Admitting: Oncology

## 2020-01-14 ENCOUNTER — Ambulatory Visit (HOSPITAL_COMMUNITY)
Admission: RE | Admit: 2020-01-14 | Discharge: 2020-01-14 | Disposition: A | Discharge status: Home / Self Care | Payer: Medicare Other | Source: Ambulatory Visit | Attending: Pulmonary Disease | Admitting: Pulmonary Disease

## 2020-01-14 DIAGNOSIS — Z23 Encounter for immunization: Secondary | ICD-10-CM | POA: Diagnosis not present

## 2020-01-14 DIAGNOSIS — U071 COVID-19: Secondary | ICD-10-CM

## 2020-01-14 MED ORDER — ONDANSETRON HCL 4 MG/2ML IJ SOLN
INTRAMUSCULAR | Status: AC
  Filled 2020-01-14: qty 2

## 2020-01-14 MED ORDER — DIPHENHYDRAMINE HCL 50 MG/ML IJ SOLN: 50.0000 mg | Freq: Once | INTRAMUSCULAR | Status: DC | PRN

## 2020-01-14 MED ORDER — FAMOTIDINE IN NACL 20-0.9 MG/50ML-% IV SOLN: 20.0000 mg | Freq: Once | INTRAVENOUS | Status: DC | PRN

## 2020-01-14 MED ORDER — SODIUM CHLORIDE 0.9 % IV SOLN
1200.0000 mg | Freq: Once | INTRAVENOUS | Status: AC
  Administered 2020-01-14: 1200 mg via INTRAVENOUS

## 2020-01-14 MED ORDER — SODIUM CHLORIDE 0.9 % IV SOLN: INTRAVENOUS | Status: DC | PRN

## 2020-01-14 MED ORDER — ALBUTEROL SULFATE HFA 108 (90 BASE) MCG/ACT IN AERS: 2.0000 | INHALATION_SPRAY | Freq: Once | RESPIRATORY_TRACT | Status: DC | PRN

## 2020-01-14 MED ORDER — METHYLPREDNISOLONE SODIUM SUCC 125 MG IJ SOLR: 125.0000 mg | Freq: Once | INTRAMUSCULAR | Status: DC | PRN

## 2020-01-14 MED ORDER — EPINEPHRINE 0.3 MG/0.3ML IJ SOAJ: 0.3000 mg | Freq: Once | INTRAMUSCULAR | Status: DC | PRN

## 2020-01-14 MED ORDER — ONDANSETRON HCL 4 MG/2ML IJ SOLN
4.0000 mg | Freq: Once | INTRAMUSCULAR | Status: AC
  Administered 2020-01-14: 4 mg via INTRAVENOUS

## 2020-01-14 MED ORDER — SODIUM CHLORIDE 0.9 % IV BOLUS
500.0000 mL | Freq: Once | INTRAVENOUS | Status: AC
  Administered 2020-01-14: 500 mL via INTRAVENOUS

## 2020-01-14 NOTE — Progress Notes (Signed)
I connected by phone with  Timothy Yoder to discuss the potential use of an new treatment for mild to moderate COVID-19 viral infection in non-hospitalized patients.   This patient is a age/sex that meets the FDA criteria for Emergency Use Authorization of casirivimab\imdevimab.  Has a (+) direct SARS-CoV-2 viral test result 1. Has mild or moderate COVID-19  2. Is ? 65 years of age and weighs ? 40 kg 3. Is NOT hospitalized due to COVID-19 4. Is NOT requiring oxygen therapy or requiring an increase in baseline oxygen flow rate due to COVID-19 5. Is within 10 days of symptom onset 6. Has at least one of the high risk factor(s) for progression to severe COVID-19 and/or hospitalization as defined in EUA. Specific high risk criteria : Past Medical History:  Diagnosis Date  . BPH (benign prostatic hyperplasia)   . CAD (coronary artery disease)    a. 04/2014 anterolateral STEMI/PCI: LM nl, LAD 49m (2.75x16 Promus DES, D1 50p, LCX nl, RI nl, OM1 100 small/chronic, RCA dominant, 20-71m, 40m/d (FFR 0.90->Med Rx), RPDA 50, RPLA 70, EF 60%.  Marland Kitchen COPD (chronic obstructive pulmonary disease) (HCC)    Dr Raul Del  . Former tobacco use   . Gastroesophageal reflux disease   . HTN (hypertension)   . Hyperlipidemia   . Hypokalemia   . Malignant neoplasm of lung ([place]) 04/01/2018  . Myocardial infarction ([place]) 04/2014   Dr Sherren Mocha  . Stented coronary artery   ?  ?    Symptom onset  01/12/20   I have spoken and communicated the following to the patient or parent/caregiver:   1. FDA has authorized the emergency use of casirivimab\imdevimab for the treatment of mild to moderate COVID-19 in adults and pediatric patients with positive results of direct SARS-CoV-2 viral testing who are 58 years of age and older weighing at least 40 kg, and who are at high risk for progressing to severe COVID-19 and/or hospitalization.   2. The significant known and potential risks and benefits of casirivimab\imdevimab, and  the extent to which such potential risks and benefits are unknown.   3. Information on available alternative treatments and the risks and benefits of those alternatives, including clinical trials.   4. Patients treated with casirivimab\imdevimab should continue to self-isolate and use infection control measures (e.g., wear mask, isolate, social distance, avoid sharing personal items, clean and disinfect "high touch" surfaces, and frequent handwashing) according to CDC guidelines.    5. The patient or parent/caregiver has the option to accept or refuse casirivimab\imdevimab .   After reviewing this information with the patient, The patient agreed to proceed with receiving casirivimab\imdevimab infusion and will be provided a copy of the Fact sheet prior to receiving the infusion.Rulon Abide, AGNP-C [phone] ([place])

## 2020-01-14 NOTE — Telephone Encounter (Signed)
Appts were rescheduled : lab/ct on 10/6 and MD on 10/8. Please move appts out 2 more weeks, per Dr. Tasia Catchings. Will give patient time to recuperate from [place] before doing CT.

## 2020-01-14 NOTE — Progress Notes (Signed)
  Diagnosis: COVID-19  Physician: Joya Gaskins, MD  Procedure: [facility]fusion [facility] Med: casirivimab\imdevimab infusion - Provided patient with casirivimab\imdevimab fact sheet for patients, parents and caregivers prior to infusion.  Complications: No immediate complications noted.  Discharge: Discharged home   [place] 01/14/2020

## 2020-01-14 NOTE — Telephone Encounter (Signed)
Per Dr. Tasia Catchings, move lab/CT & MD 2 weeks out from already scheduled date. Please inform pt of appts.

## 2020-01-14 NOTE — Discharge Instructions (Signed)

## 2020-01-14 NOTE — Telephone Encounter (Signed)
Yes. I can call them. Thanks.  Faythe Casa, NP 01/14/2020 9:38 AM

## 2020-01-14 NOTE — Telephone Encounter (Signed)
Patient called reporting that he and his wife both tested positive for COVID yesterday and states that he has appointments next week that he knows will have to be rescheduled. He is asking what he needs to do. I told him he may qualify for MAb treatment, but would send message to doctor. Please advise

## 2020-01-19 ENCOUNTER — Inpatient Hospital Stay: Payer: Medicare Other

## 2020-01-19 ENCOUNTER — Ambulatory Visit: Payer: Medicare Other

## 2020-01-21 ENCOUNTER — Ambulatory Visit: Payer: Medicare Other | Admitting: Oncology

## 2020-01-28 ENCOUNTER — Other Ambulatory Visit: Payer: Medicare Other

## 2020-01-28 ENCOUNTER — Ambulatory Visit: Payer: Medicare Other

## 2020-01-30 ENCOUNTER — Ambulatory Visit: Payer: Medicare Other | Admitting: Oncology

## 2020-02-02 ENCOUNTER — Other Ambulatory Visit: Payer: Medicare Other

## 2020-02-02 IMAGING — RF DG FLUORO GUIDE CV LINE
2 series · 7 of 7 positions shown · IV contrast (agent unspecified)
Comparison: none

CLINICAL DATA: Lung adenocarcinoma and status post placement
left-sided Port-A-Cath on 04/04/2018 for chemotherapy. The port has
been functioning properly until it was noted that there was lack of
blood return after access today.

EXAM:
CONTRAST INJECTION OF PORT A CATH UNDER FLUOROSCOPY
CONTRAST:  20 mL [id number]
FLUOROSCOPY TIME:  30 seconds.  31.3 mGy.
PROCEDURE:
Contrast was administered via the indwelling port after it was
accessed. Fluoroscopic spot images were obtained of the catheter
during injection.

[Series 1: cp_standard · 0.18mm/px · 3 of 3 slices shown]
[im 1/3]
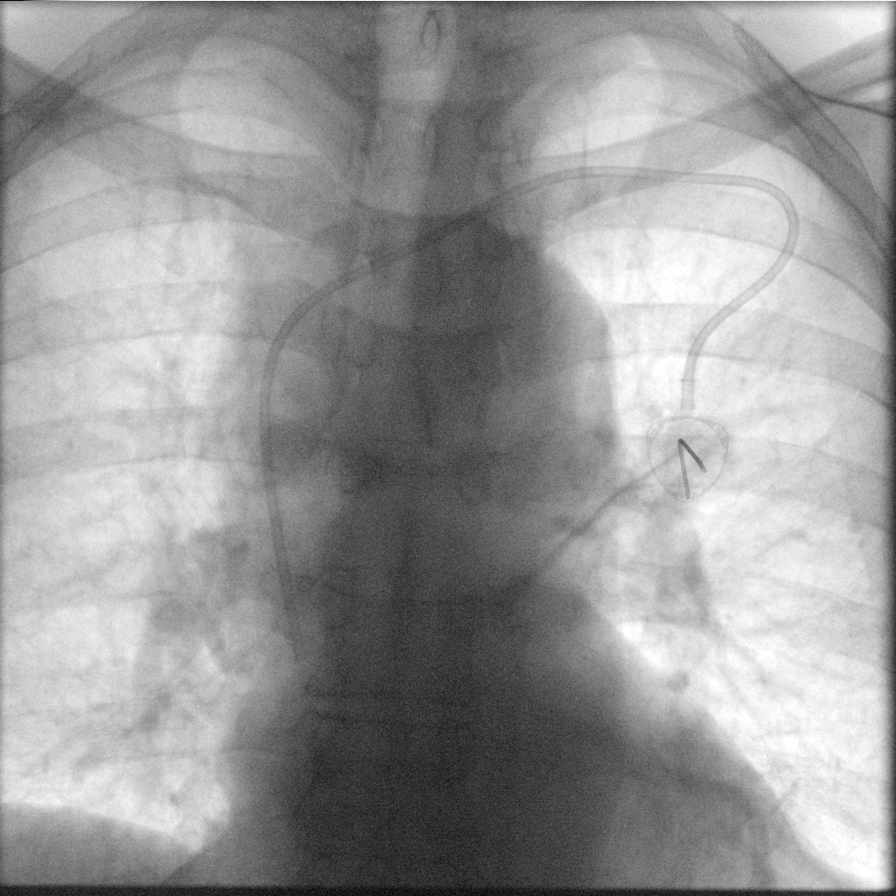
[im 2/3]
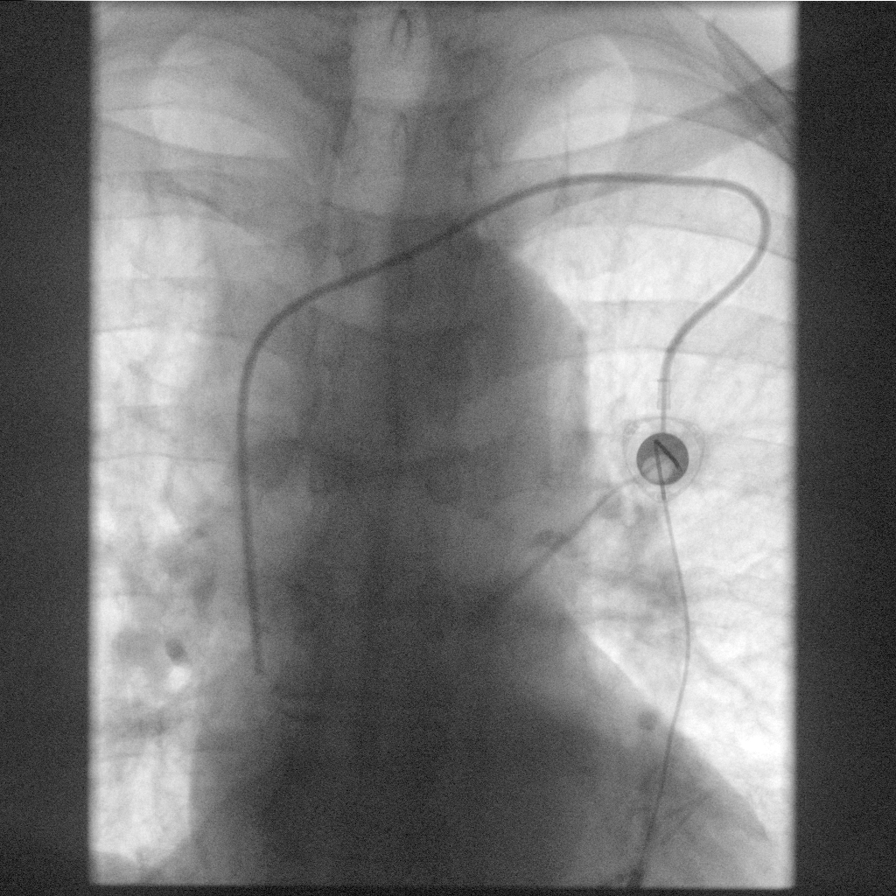
[im 3/3]
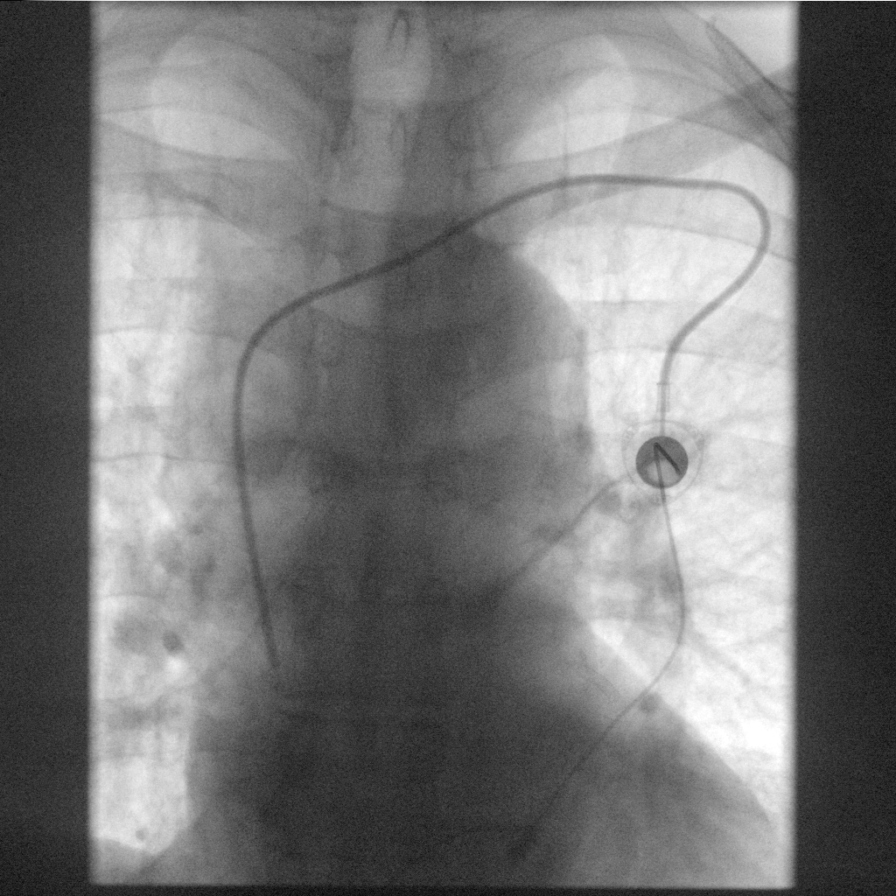

[Series 2: fluoro_iodine 2fps_bw · 0.18mm/px · 4 of 16 frames shown]
[frame 3/16]
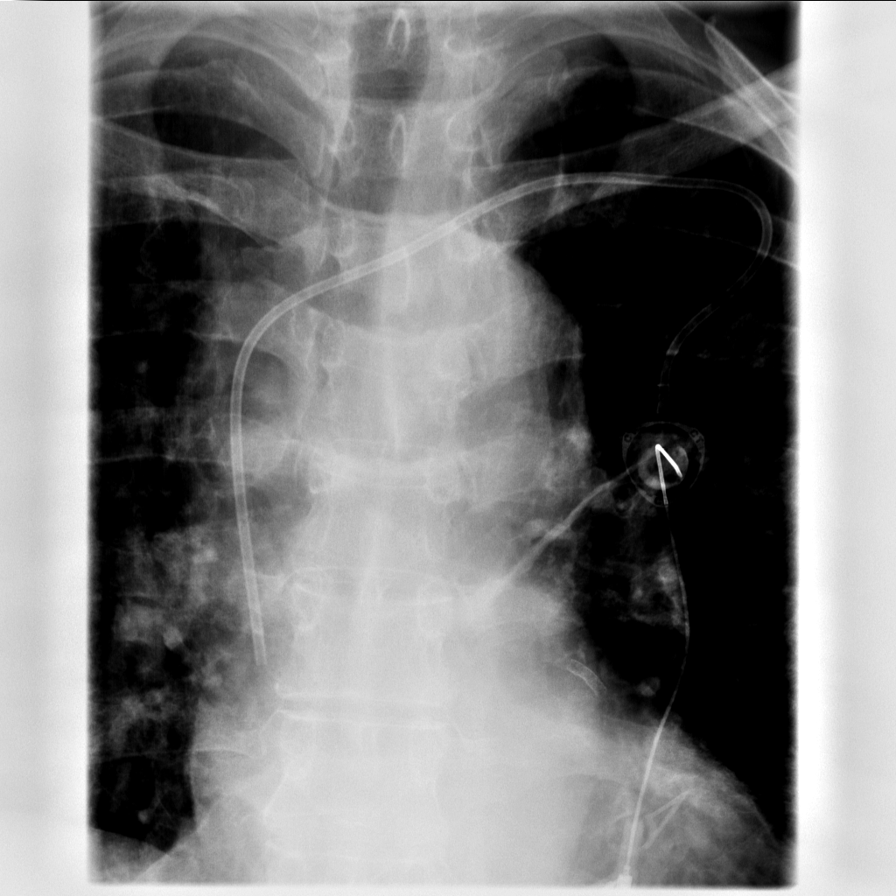
[frame 9/16]
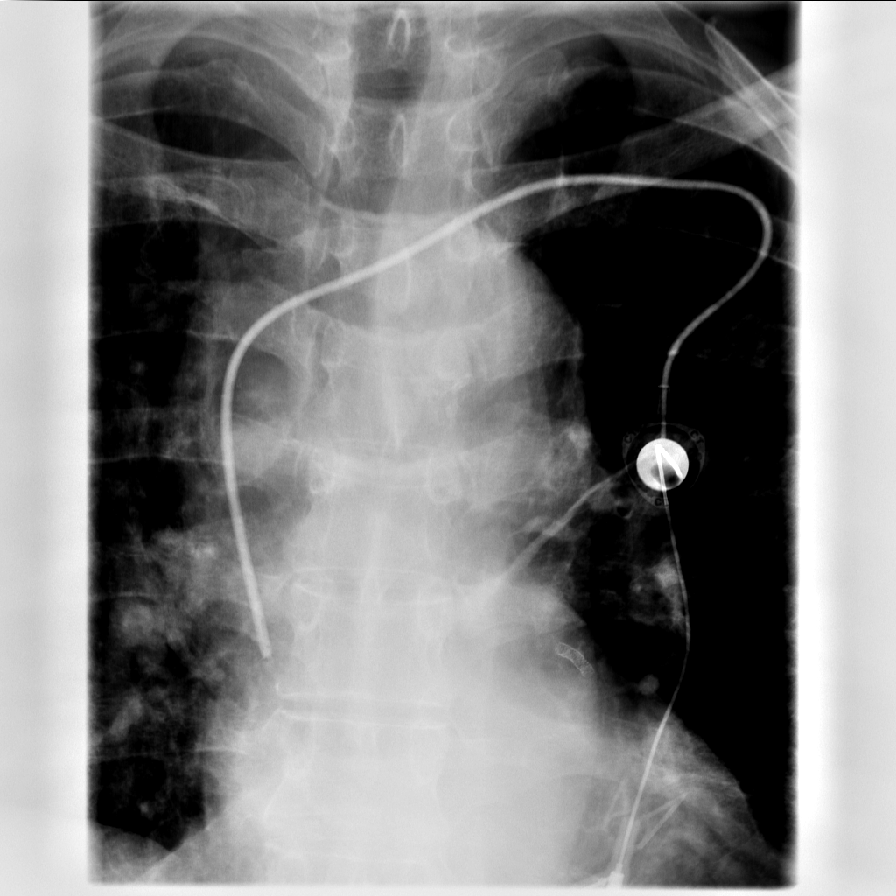
[frame 12/16]
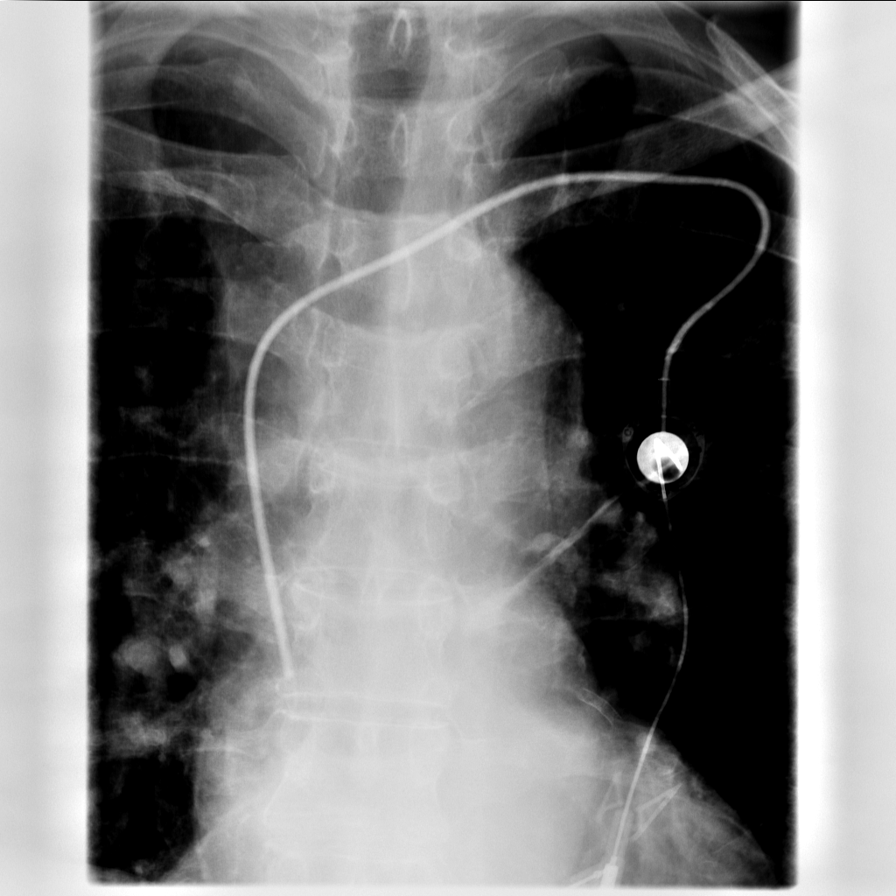
[frame 14/16]
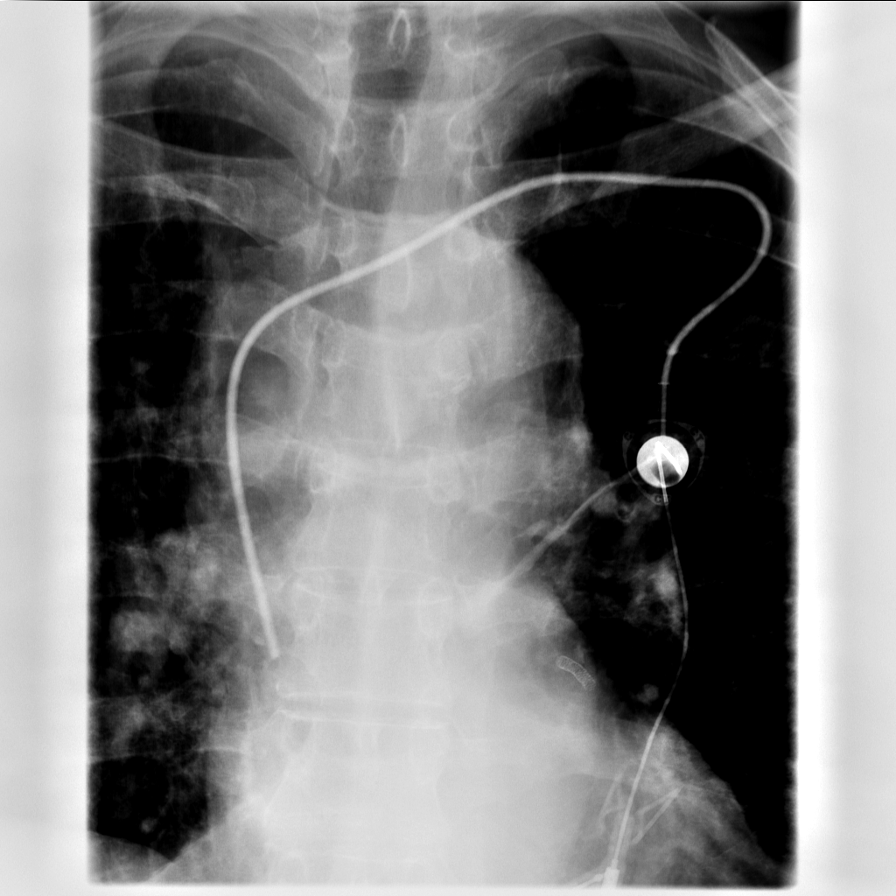

[7 of 7 positions shown; findings below may reference images not displayed]

FINDINGS: The left subclavian Port-A-Cath is well positioned with the catheter
tip at the SVC/RA junction. Contrast injection demonstrates some
nonocclusive round debris within the port reservoir. Injected
contrast exits the attached catheter without evidence of contrast
extravasation or catheter damage. At the tip of the catheter, there
is predominantly outflow of contrast in a transverse direction
towards the right lateral wall of the SVC. Findings are consistent
with fibrin sheath at the tip of the catheter. This is nonocclusive
and contrast does eventually flow freely in the SVC and right
atrium.
IMPRESSION: Nonocclusive debris within the reservoir of the port and evidence of
fibrin sheath development at the tip of the port catheter located at
the SVC/RA junction. Injected contrast does flow freely into the SVC
and right atrium without evidence of extravasation.

## 2020-02-11 ENCOUNTER — Ambulatory Visit
Admission: RE | Admit: 2020-02-11 | Discharge: 2020-02-11 | Disposition: A | Discharge status: Home / Self Care | Payer: Medicare Other | Source: Ambulatory Visit | Attending: Oncology | Admitting: Oncology

## 2020-02-11 ENCOUNTER — Other Ambulatory Visit: Payer: Self-pay

## 2020-02-11 ENCOUNTER — Inpatient Hospital Stay: Payer: Medicare Other | Attending: Oncology

## 2020-02-11 DIAGNOSIS — Z8616 Personal history of COVID-19: Secondary | ICD-10-CM | POA: Diagnosis not present

## 2020-02-11 DIAGNOSIS — Z79899 Other long term (current) drug therapy: Secondary | ICD-10-CM | POA: Insufficient documentation

## 2020-02-11 DIAGNOSIS — Z87891 Personal history of nicotine dependence: Secondary | ICD-10-CM | POA: Diagnosis not present

## 2020-02-11 DIAGNOSIS — I10 Essential (primary) hypertension: Secondary | ICD-10-CM | POA: Diagnosis not present

## 2020-02-11 DIAGNOSIS — C3431 Malignant neoplasm of lower lobe, right bronchus or lung: Secondary | ICD-10-CM | POA: Insufficient documentation

## 2020-02-11 DIAGNOSIS — I252 Old myocardial infarction: Secondary | ICD-10-CM | POA: Insufficient documentation

## 2020-02-11 DIAGNOSIS — K219 Gastro-esophageal reflux disease without esophagitis: Secondary | ICD-10-CM | POA: Insufficient documentation

## 2020-02-11 DIAGNOSIS — E785 Hyperlipidemia, unspecified: Secondary | ICD-10-CM | POA: Insufficient documentation

## 2020-02-11 DIAGNOSIS — J449 Chronic obstructive pulmonary disease, unspecified: Secondary | ICD-10-CM | POA: Diagnosis not present

## 2020-02-11 DIAGNOSIS — R748 Abnormal levels of other serum enzymes: Secondary | ICD-10-CM | POA: Diagnosis not present

## 2020-02-11 DIAGNOSIS — N400 Enlarged prostate without lower urinary tract symptoms: Secondary | ICD-10-CM | POA: Diagnosis not present

## 2020-02-11 DIAGNOSIS — Z7982 Long term (current) use of aspirin: Secondary | ICD-10-CM | POA: Insufficient documentation

## 2020-02-11 DIAGNOSIS — I2510 Atherosclerotic heart disease of native coronary artery without angina pectoris: Secondary | ICD-10-CM | POA: Insufficient documentation

## 2020-02-11 LAB — COMPREHENSIVE METABOLIC PANEL
ALT: 19 U/L (ref 0–44)
AST: 18 U/L (ref 15–41)
Albumin: 3.9 g/dL (ref 3.5–5.0)
Alkaline Phosphatase: 103 U/L (ref 38–126)
Anion gap: 7 (ref 5–15)
BUN: 9 mg/dL (ref 8–23)
CO2: 27 mmol/L (ref 22–32)
Calcium: 9 mg/dL (ref 8.9–10.3)
Chloride: 106 mmol/L (ref 98–111)
Creatinine, Ser: 0.99 mg/dL (ref 0.61–1.24)
GFR, Estimated: >60 mL/min (ref >60)
Glucose, Bld: 76 mg/dL (ref 70–99)
Potassium: 4 mmol/L (ref 3.5–5.1)
Sodium: 140 mmol/L (ref 135–145)
Total Bilirubin: 0.8 mg/dL (ref 0.3–1.2)
Total Protein: 6.7 g/dL (ref 6.5–8.1)

## 2020-02-11 LAB — CBC WITH DIFFERENTIAL/PLATELET
Abs Immature Granulocytes: 0.04 10*3/uL (ref 0.00–0.07)
Basophils Absolute: 0.1 10*3/uL (ref 0.0–0.1)
Basophils Relative: 1 %
Eosinophils Absolute: 0.1 10*3/uL (ref 0.0–0.5)
Eosinophils Relative: 2 %
HCT: 45.4 % (ref 39.0–52.0)
Hemoglobin: 15.6 g/dL (ref 13.0–17.0)
Immature Granulocytes: 1 %
Lymphocytes Relative: 21 %
Lymphs Abs: 1.2 10*3/uL (ref 0.7–4.0)
MCH: 31.8 pg (ref 26.0–34.0)
MCHC: 34.4 g/dL (ref 30.0–36.0)
MCV: 92.7 fL (ref 80.0–100.0)
Monocytes Absolute: 0.7 10*3/uL (ref 0.1–1.0)
Monocytes Relative: 12 %
Neutro Abs: 3.7 10*3/uL (ref 1.7–7.7)
Neutrophils Relative %: 63 %
Platelets: 158 10*3/uL (ref 150–400)
RBC: 4.9 MIL/uL (ref 4.22–5.81)
RDW: 13.7 % (ref 11.5–15.5)
WBC: 5.7 10*3/uL (ref 4.0–10.5)
nRBC: 0 % (ref 0.0–0.2)

## 2020-02-11 MED ORDER — IOHEXOL 300 MG/ML  SOLN
75.0000 mL | Freq: Once | INTRAMUSCULAR | Status: AC | PRN
  Administered 2020-02-11: 75 mL via INTRAVENOUS

## 2020-02-13 ENCOUNTER — Other Ambulatory Visit: Payer: Self-pay

## 2020-02-13 ENCOUNTER — Inpatient Hospital Stay (HOSPITAL_BASED_OUTPATIENT_CLINIC_OR_DEPARTMENT_OTHER): Payer: Medicare Other | Admitting: Oncology

## 2020-02-13 ENCOUNTER — Encounter: Payer: Self-pay | Admitting: Oncology

## 2020-02-13 VITALS — BP 137/74 | HR 76 | Temp 96.6°F | Resp 18 | Wt 159.9 lb

## 2020-02-13 DIAGNOSIS — Z95828 Presence of other vascular implants and grafts: Secondary | ICD-10-CM

## 2020-02-13 DIAGNOSIS — J449 Chronic obstructive pulmonary disease, unspecified: Secondary | ICD-10-CM

## 2020-02-13 DIAGNOSIS — C3431 Malignant neoplasm of lower lobe, right bronchus or lung: Secondary | ICD-10-CM | POA: Diagnosis not present

## 2020-02-13 NOTE — Progress Notes (Signed)
Patient here for follow up. No new concerns voiced.  °

## 2020-02-13 NOTE — Progress Notes (Signed)
Hematology/Oncology follow up note [facility] [phone] [phone]   Patient Care Team: Baxter Hire, MD as PCP - General (Internal Medicine) Sherren Mocha, MD as PCP - Cardiology (Cardiology) Telford Nab, RN as Registered Nurse  REFERRING PROVIDER: Dr.Fleming REASON FOR VISIT:  Follow-up for management of stage III non-small cell lung cancer   HISTORY OF PRESENTING ILLNESS:  Timothy Yoder is a  64 y.o.  male with PMH listed below who was referred to me for evaluation of lung mass.  Patient is a former smoker quit smoking 7 years ago.  40-pack-year. Patient chest lung cancer screening on 03/11/2018. CT scan showed 2.4 cm right lower lobe nodule with a new necrotic.  Subcarinal lymph node. PET scan showed hypermetabolic right lower pulmonary nodule, consistent with primary bronchogenic carcinoma.  Fluid density subcarinal structure demonstrate no significant hypermetabolism.  Although this could represent an incidental benign lesion such as a cyst given the interval development since 02/24/2017, necrotic adenopathy cannot be excluded and tissue sampling should be considered.  # underwent biopsy via bronchoscopy.  Subcarinal lymph node biopsy positive for non-small cell lung cancer, favoring poorly differentiated adenocarcinoma.  Stage IIIA T1c N2M0 #Mediport was placed by Dr. Genevive Bi  # #Elevated alkaline phosphatase. Patient has been evaluated by gastroenterology.  Liver biopsy showed mild sinusoidal dilation and congestion.  Minimal changes suggestive of hepatic process. Iron stain demonstrate minimal focal hepatocellular iron. I discussed with Dr.Wohl via secure chat about patient's pathology result.  No restriction of proceeding with immunotherapy.  Continue watch for the labs.   # Cancer Treatment 04/26/2018 -06/11/2018 Concurrent Chemotherapy [weekly Carbo and Taxol] with RT.  07/10/2018 Started on maintenance Durvalumab. Finished  immunotherapy in May 2021 INTERVAL HISTORY Timothy Yoder is a 63 y.o. male who has above history reviewed by me today presents for follow up visit for management of stage III non-small cell lung cancer.   Recent COVID-19 infection.  He reports symptom has improved no fever, chills, denies any more shortness of breath than his baseline.   Review of Systems  Constitutional: Negative for appetite change, chills, diaphoresis, fatigue, fever and unexpected weight change.  HENT:   Negative for hearing loss, lump/mass, nosebleeds, sore throat and voice change.   Eyes: Negative for eye problems and icterus.  Respiratory: Positive for shortness of breath. Negative for chest tightness, cough, hemoptysis and wheezing.   Cardiovascular: Negative for chest pain and leg swelling.  Gastrointestinal: Negative for abdominal distention, abdominal pain, blood in stool, diarrhea, nausea and rectal pain.  Endocrine: Negative for hot flashes.  Genitourinary: Negative for bladder incontinence, difficulty urinating, dysuria, frequency, hematuria and nocturia.   Musculoskeletal: Negative for arthralgias, back pain, flank pain, gait problem and myalgias.  Skin: Negative for itching and rash.  Neurological: Negative for dizziness, gait problem, headaches, light-headedness, numbness and seizures.  Hematological: Negative for adenopathy. Does not bruise/bleed easily.  Psychiatric/Behavioral: Negative for confusion and decreased concentration. The patient is not nervous/anxious.    MEDICAL HISTORY:  Past Medical History:  Diagnosis Date  . BPH (benign prostatic hyperplasia)   . CAD (coronary artery disease)    a. 04/2014 anterolateral STEMI/PCI: LM nl, LAD 9m(2.75x16 Promus DES, D1 50p, LCX nl, RI nl, OM1 100 small/chronic, RCA dominant, [id number](FFR 0.90->Med Rx), RPDA 50, RPLA 70, EF 60%.  . CMarland KitchenPD (chronic obstructive pulmonary disease) (HCC)    Dr FleRaul Del Former tobacco use   . Gastroesophageal reflux  disease   . HTN (hypertension)   .  Hyperlipidemia   . Hypokalemia   . Malignant neoplasm of lung ([place]) 04/01/2018  . Myocardial infarction ([place]) 04/2014   Dr Sherren Mocha  . Stented coronary artery     SURGICAL HISTORY: Past Surgical History:  Procedure Laterality Date  . CARDIAC CATHETERIZATION    . CHOLECYSTECTOMY N/A 02/15/2016   Procedure: LAPAROSCOPIC CHOLECYSTECTOMY WITH INTRAOPERATIVE CHOLANGIOGRAM;  Surgeon: Leonie Green, MD;  Location: [facility] ORS;  Service: General;  Laterality: N/A;  . COLONOSCOPY    . COLONOSCOPY WITH PROPOFOL N/A 08/02/2015   Procedure: COLONOSCOPY WITH PROPOFOL;  Surgeon: Manya Silvas, MD;  Location: [facility] ENDOSCOPY;  Service: Endoscopy;  Laterality: N/A;  . CORONARY ANGIOPLASTY  04/2014   STENT  . ENDOBRONCHIAL ULTRASOUND N/A 03/26/2018   Procedure: ENDOBRONCHIAL ULTRASOUND;  Surgeon: Flora Lipps, MD;  Location: [facility] ORS;  Service: Cardiopulmonary;  Laterality: N/A;  . ESOPHAGOGASTRODUODENOSCOPY (EGD) WITH PROPOFOL N/A 08/02/2015   Procedure: ESOPHAGOGASTRODUODENOSCOPY (EGD) WITH PROPOFOL;  Surgeon: Manya Silvas, MD;  Location: [facility] ENDOSCOPY;  Service: Endoscopy;  Laterality: N/A;  . FLEXIBLE BRONCHOSCOPY N/A 03/26/2018   Procedure: FLEXIBLE BRONCHOSCOPY;  Surgeon: Flora Lipps, MD;  Location: [facility] ORS;  Service: Cardiopulmonary;  Laterality: N/A;  . KNEE ARTHROSCOPY Left   . LEFT HEART CATHETERIZATION WITH CORONARY ANGIOGRAM N/A 05/03/2014   Procedure: LEFT HEART CATHETERIZATION WITH CORONARY ANGIOGRAM;  Surgeon: Blane Ohara, MD;  Location: [facility] CATH LAB;  Service: Cardiovascular;  Laterality: N/A;  . PERCUTANEOUS CORONARY STENT INTERVENTION (PCI-S) N/A 05/05/2014   Procedure: PERCUTANEOUS CORONARY STENT INTERVENTION (PCI-S);  Surgeon: Peter M Martinique, MD;  Location: [facility] CATH LAB;  Service: Cardiovascular;  Laterality: N/A;  . PORTACATH PLACEMENT Left 04/04/2018   Procedure: INSERTION PORT-A-CATH;  Surgeon: [name], MD;  Location: [facility]  ORS;  Service: General;  Laterality: Left;    SOCIAL HISTORY: Social History   Socioeconomic History  . Marital status: Married    Spouse name: Not on file  . Number of children: Not on file  . Years of education: Not on file  . Highest education level: Not on file  Occupational History  . Not on file  Tobacco Use  . Smoking status: Former Smoker    Packs/day: 1.00    Years: 40.00    Pack years: 40.00    Types: Cigarettes    Quit date: 02/14/2011    Years since quitting: 9.0  . Smokeless tobacco: Never Used  Vaping Use  . Vaping Use: Never used  Substance and Sexual Activity  . Alcohol use: No  . Drug use: No  . Sexual activity: Not on file  Other Topics Concern  . Not on file  Social History Narrative   The patient is married. He lives in [place]. He works full-time. He quit smoking in 2013.   Social Determinants of Health   Financial Resource Strain:   . Difficulty of Paying Living Expenses: Not on file  Food Insecurity:   . Worried About Charity fundraiser in the Last Year: Not on file  . Ran Out of Food in the Last Year: Not on file  Transportation Needs:   . Lack of Transportation (Medical): Not on file  . Lack of Transportation (Non-Medical): Not on file  Physical Activity:   . Days of Exercise per Week: Not on file  . Minutes of Exercise per Session: Not on file  Stress:   . Feeling of Stress : Not on file  Social Connections:   . Frequency of Communication with Friends and Family: Not  on file  . Frequency of Social Gatherings with Friends and Family: Not on file  . Attends Religious Services: Not on file  . Active Member of Clubs or Organizations: Not on file  . Attends Archivist Meetings: Not on file  . Marital Status: Not on file  Intimate Partner Violence:   . Fear of Current or Ex-Partner: Not on file  . Emotionally Abused: Not on file  . Physically Abused: Not on file  . Sexually Abused: Not on file    FAMILY HISTORY: Family  History  Problem Relation Age of Onset  . CAD Mother 78       Died of an MI  . CAD Father 59       Died of a massive MI  . COPD Sister   . Lung cancer Brother   . Bone cancer Paternal Uncle   . Colon cancer Neg Hx   . Breast cancer Neg Hx     ALLERGIES:  has No Known Allergies.  MEDICATIONS:  Current Outpatient Medications  Medication Sig Dispense Refill  . acetaminophen (TYLENOL) 500 MG tablet Take 1,000 mg by mouth every 6 (six) hours as needed (for pain.).    Marland Kitchen albuterol (PROAIR HFA) 108 (90 Base) MCG/ACT inhaler Inhale 2 puffs into the lungs every 6 (six) hours as needed for wheezing or shortness of breath. 18 g 5  . aspirin EC 81 MG tablet Take 81 mg by mouth daily.    Marland Kitchen atorvastatin (LIPITOR) 80 MG tablet Take 1 tablet (80 mg total) by mouth daily at 6 PM. 90 tablet 3  . carvedilol (COREG) 6.25 MG tablet TAKE 1 TABLET TWICE A DAY (PLEASE KEEP UPCOMING APPOINTMENT IN MAY WITH DR. Burt Knack BEFORE ANYMORE REFILLS, THANK YOU) 180 tablet 3  . finasteride (PROSCAR) 5 MG tablet Take by mouth.    . lidocaine-prilocaine (EMLA) cream Apply small amount to port site 1 hour prior to [facility] being accessed and cover with plastic wrap 30 g 1  . nitroGLYCERIN (NITROSTAT) 0.4 MG SL tablet Place 1 tablet (0.4 mg total) under the tongue every 5 (five) minutes as needed for chest pain. X 3 doses 25 tablet 3  . omeprazole (PRILOSEC) 40 MG capsule Take 1 capsule (40 mg total) by mouth 2 (two) times daily. 180 capsule 1  . ondansetron (ZOFRAN) 8 MG tablet PLEASE SEE ATTACHED FOR DETAILED DIRECTIONS    . tamsulosin (FLOMAX) 0.4 MG CAPS capsule Take 0.4 mg by mouth daily after supper.    . umeclidinium-vilanterol (ANORO ELLIPTA) 62.5-25 MCG/INH AEPB Inhale 1 puff into the lungs daily. 1 each 5   No current facility-administered medications for this visit.     PHYSICAL EXAMINATION: ECOG PERFORMANCE STATUS: 1 - Symptomatic but completely ambulatory Vitals:   02/13/20 1046  BP: 137/74  Pulse: 76   Resp: 18  Temp: (!) 96.6 F (35.9 C)   Filed Weights   02/13/20 1046  Weight: 159 lb 14.4 oz (72.5 kg)    Physical Exam Constitutional:      General: He is not in acute distress. HENT:     Head: Normocephalic and atraumatic.  Eyes:     General: No scleral icterus. Cardiovascular:     Rate and Rhythm: Normal rate and regular rhythm.     Heart sounds: Normal heart sounds.  Pulmonary:     Effort: Pulmonary effort is normal. No respiratory distress.     Breath sounds: No wheezing.  Abdominal:     General: Bowel sounds are  normal. There is no distension.     Palpations: Abdomen is soft.  Musculoskeletal:        General: No deformity. Normal range of motion.     Cervical back: Normal range of motion and neck supple.  Skin:    General: Skin is warm and dry.     Findings: No erythema or rash.  Neurological:     Mental Status: He is alert and oriented to person, place, and time. Mental status is at baseline.     Cranial Nerves: No cranial nerve deficit.     Coordination: Coordination normal.  Psychiatric:        Mood and Affect: Mood normal.      LABORATORY DATA:  I have reviewed the data as listed Lab Results  Component Value Date   WBC 5.7 02/11/2020   HGB 15.6 02/11/2020   HCT 45.4 02/11/2020   MCV 92.7 02/11/2020   PLT 158 02/11/2020   Recent Labs    08/26/19 0803 08/26/19 0803 09/09/19 0833 10/20/19 0921 02/11/20 0939  NA 136   < > 138 139 140  K 3.9   < > 3.7 4.4 4.0  CL 103   < > 105 103 106  CO2 26   < > _0 GLUCOSE 114*   < > 104* 122* 76  BUN 10   < > _1 CREATININE 1.04   < > 1.05 1.15 0.99  CALCIUM 8.6*   < > 8.4* 8.9 9.0  GFRNONAA >60   < > >60 >60 >60  GFRAA >60  --  >60 >60  --   PROT 6.6   < > 6.6 6.9 6.7  ALBUMIN 3.8   < > 3.7 4.0 3.9  AST 20   < > _2 ALT 21   < > _3 ALKPHOS 144*   < > 130* 126 103  BILITOT 1.0   < > 0.6 0.7 0.8   < > = values in this interval not displayed.   Iron/TIBC/Ferritin/ %Sat     Component Value Date/Time   IRON 41 (L) 11/21/2018 0856   TIBC 260 11/21/2018 0856   FERRITIN 150 11/21/2018 0856   IRONPCTSAT 16 (L) 11/21/2018 0856    RADIOGRAPHIC STUDIES: I have personally reviewed the radiological images as listed and agreed with the findings in the report.  CT Chest W Contrast  Result Date: 02/11/2020 CLINICAL DATA:  Lung cancer follow-up. Right lower lobe lung cancer post chemotherapy and immunotherapy. EXAM: CT CHEST WITH CONTRAST TECHNIQUE: Multidetector CT imaging of the chest was performed during intravenous contrast administration. CONTRAST:  99m OMNIPAQUE IOHEXOL 300 MG/ML  SOLN COMPARISON:  Chest CT 10/17/2019 and 07/09/2019. FINDINGS: Cardiovascular: Atherosclerosis of the aorta, great vessels and coronary arteries. Left subclavian Port-A-Cath tip extends to the mid SVC. No acute vascular findings. The heart size is normal. There is no pericardial effusion. Mediastinum/Nodes: There are no enlarged mediastinal, hilar or axillary lymph nodes. The thyroid gland, trachea and esophagus demonstrate no significant findings. Lungs/Pleura: No pleural effusion or pneumothorax. Stable mild to moderate centrilobular emphysema with biapical and lingular scarring. Further contraction of the treatment changes medially in the right lower lobe, without evidence of local recurrence. The perifissural nodule described on the most recent study has decreased in size, now measuring 4 mm maximally. The right upper lobe nodularity seen on the most recent study has resolved. No new or enlarging nodules. There are calcified granulomas bilaterally. Upper abdomen:  The visualized upper abdomen appears stable post cholecystectomy. Stable low-density lesion laterally in the dome of the right hepatic lobe (image 127/2). No adrenal mass. Musculoskeletal/Chest wall: There is no chest wall mass or suspicious osseous finding. IMPRESSION: 1. Further contraction of treatment changes medially in the right  lower lobe, without evidence of local recurrence or metastatic disease. 2. Interval resolution of previously demonstrated right upper lobe nodularity and improvement in the perifissural nodule. No new or enlarging nodules. 3. Aortic Atherosclerosis (ICD10-I70.0) and Emphysema (ICD10-J43.9). Electronically Signed   By: Richardean Sale M.D.   On: 02/11/2020 18:54     ASSESSMENT & PLAN:  1. Malignant neoplasm of lower lobe of right lung ([place])   2. Port-A-Cath in place   3. Chronic obstructive pulmonary disease, unspecified COPD type ([place])   Cancer Staging Malignant neoplasm of lower lobe of right lung [facility]) Staging form: Lung, AJCC 8th Edition - Clinical stage from 05/13/2018: Stage IIIA (cT1c, cN2, cM0) - Signed by Earlie Server, MD on 05/13/2018  #Stage IIIA lung adenocarcinoma Labs are reviewed and discussed with patient .  CT images were independently reviewed by me and discussed with patient .  Stable disease.  No signs of cancer recurrence of metastatic disease .  New right upper lobe nodule has resolved. Discussed with the patient and recommend to continue CT chest surveillance every 3 months and to he reaches 2 years after concurrent chemoradiation.  Then will proceed every 6 months CT until year 5. He agrees with the plan  Port-A-Cath in place, continue port flush every 6 to 8 weeks.  He may have Mediport taken out after 2 years.  Follow up in 3 months.  Earlie Server, MD, PhD 02/13/20

## 2020-02-24 ENCOUNTER — Inpatient Hospital Stay: Payer: Medicare Other | Attending: Oncology

## 2020-02-24 DIAGNOSIS — Z452 Encounter for adjustment and management of vascular access device: Secondary | ICD-10-CM | POA: Insufficient documentation

## 2020-02-24 DIAGNOSIS — C3431 Malignant neoplasm of lower lobe, right bronchus or lung: Secondary | ICD-10-CM | POA: Diagnosis not present

## 2020-02-24 DIAGNOSIS — Z95828 Presence of other vascular implants and grafts: Secondary | ICD-10-CM

## 2020-02-24 MED ORDER — SODIUM CHLORIDE 0.9% FLUSH
10.0000 mL | Freq: Once | INTRAVENOUS | Status: AC
  Administered 2020-02-24: 10 mL via INTRAVENOUS
  Filled 2020-02-24: qty 10

## 2020-02-24 MED ORDER — HEPARIN SOD (PORK) LOCK FLUSH 100 UNIT/ML IV SOLN
INTRAVENOUS | Status: AC
  Filled 2020-02-24: qty 5

## 2020-02-24 MED ORDER — HEPARIN SOD (PORK) LOCK FLUSH 100 UNIT/ML IV SOLN
500.0000 [IU] | Freq: Once | INTRAVENOUS | Status: AC
  Administered 2020-02-24: 500 [IU] via INTRAVENOUS
  Filled 2020-02-24: qty 5

## 2020-03-30 ENCOUNTER — Other Ambulatory Visit: Payer: Self-pay | Admitting: Adult Health

## 2020-03-30 MED ORDER — ANORO ELLIPTA 62.5-25 MCG/INH IN AEPB: 1.0000 | INHALATION_SPRAY | Freq: Every day | RESPIRATORY_TRACT | 3 refills | Status: DC

## 2020-04-26 ENCOUNTER — Inpatient Hospital Stay: Payer: Medicare Other | Attending: Oncology

## 2020-04-26 ENCOUNTER — Ambulatory Visit: Payer: Medicare Other | Attending: Internal Medicine

## 2020-04-26 DIAGNOSIS — Z452 Encounter for adjustment and management of vascular access device: Secondary | ICD-10-CM | POA: Insufficient documentation

## 2020-04-26 DIAGNOSIS — J449 Chronic obstructive pulmonary disease, unspecified: Secondary | ICD-10-CM | POA: Diagnosis not present

## 2020-04-26 DIAGNOSIS — I252 Old myocardial infarction: Secondary | ICD-10-CM | POA: Insufficient documentation

## 2020-04-26 DIAGNOSIS — C3431 Malignant neoplasm of lower lobe, right bronchus or lung: Secondary | ICD-10-CM | POA: Insufficient documentation

## 2020-04-26 DIAGNOSIS — I2510 Atherosclerotic heart disease of native coronary artery without angina pectoris: Secondary | ICD-10-CM | POA: Diagnosis not present

## 2020-04-26 DIAGNOSIS — Z9221 Personal history of antineoplastic chemotherapy: Secondary | ICD-10-CM | POA: Insufficient documentation

## 2020-04-26 DIAGNOSIS — Z87891 Personal history of nicotine dependence: Secondary | ICD-10-CM | POA: Diagnosis not present

## 2020-04-26 DIAGNOSIS — Z23 Encounter for immunization: Secondary | ICD-10-CM

## 2020-04-26 DIAGNOSIS — R748 Abnormal levels of other serum enzymes: Secondary | ICD-10-CM | POA: Insufficient documentation

## 2020-04-26 DIAGNOSIS — I10 Essential (primary) hypertension: Secondary | ICD-10-CM | POA: Diagnosis not present

## 2020-04-26 DIAGNOSIS — N400 Enlarged prostate without lower urinary tract symptoms: Secondary | ICD-10-CM | POA: Insufficient documentation

## 2020-04-26 DIAGNOSIS — Z79899 Other long term (current) drug therapy: Secondary | ICD-10-CM | POA: Insufficient documentation

## 2020-04-26 DIAGNOSIS — Z7982 Long term (current) use of aspirin: Secondary | ICD-10-CM | POA: Diagnosis not present

## 2020-04-26 DIAGNOSIS — E785 Hyperlipidemia, unspecified: Secondary | ICD-10-CM | POA: Insufficient documentation

## 2020-04-26 DIAGNOSIS — K219 Gastro-esophageal reflux disease without esophagitis: Secondary | ICD-10-CM | POA: Diagnosis not present

## 2020-04-26 DIAGNOSIS — Z923 Personal history of irradiation: Secondary | ICD-10-CM | POA: Insufficient documentation

## 2020-04-26 DIAGNOSIS — Z95828 Presence of other vascular implants and grafts: Secondary | ICD-10-CM

## 2020-04-26 MED ORDER — SODIUM CHLORIDE 0.9% FLUSH
10.0000 mL | Freq: Once | INTRAVENOUS | Status: AC
  Administered 2020-04-26: 10 mL via INTRAVENOUS
  Filled 2020-04-26: qty 10

## 2020-04-26 MED ORDER — HEPARIN SOD (PORK) LOCK FLUSH 100 UNIT/ML IV SOLN
500.0000 [IU] | Freq: Once | INTRAVENOUS | Status: AC
  Administered 2020-04-26: 500 [IU] via INTRAVENOUS
  Filled 2020-04-26: qty 5

## 2020-04-26 MED ORDER — HEPARIN SOD (PORK) LOCK FLUSH 100 UNIT/ML IV SOLN
INTRAVENOUS | Status: AC
  Filled 2020-04-26: qty 5

## 2020-04-26 NOTE — Progress Notes (Signed)
   Covid-19 Vaccination Clinic  Name:  Timothy Yoder    MRN: [id number] DOB: [birth year]  04/26/2020  Mr. Watters was observed post Covid-19 immunization for 15 minutes without incident. He was provided with Vaccine Information Sheet and instruction to access the V-Safe system.   Mr. Hubbert was instructed to call 911 with any severe reactions post vaccine: Marland Kitchen Difficulty breathing  . Swelling of face and throat  . A fast heartbeat  . A bad rash all over body  . Dizziness and weakness   Immunizations Administered    Name Date Dose VIS Date Route   Pfizer COVID-19 Vaccine 04/26/2020  3:11 PM 0.3 mL 02/11/2020 Intramuscular   Manufacturer: [place]   Lot: [id number]   NDC: [address]-0

## 2020-04-27 ENCOUNTER — Other Ambulatory Visit: Payer: Self-pay

## 2020-04-27 ENCOUNTER — Encounter (HOSPITAL_COMMUNITY): Payer: Self-pay | Admitting: Emergency Medicine

## 2020-04-27 ENCOUNTER — Emergency Department (HOSPITAL_COMMUNITY)
Admission: EM | Admit: 2020-04-27 | Discharge: 2020-04-28 | Disposition: A | Discharge status: Home / Self Care | Payer: Medicare Other | Attending: Emergency Medicine | Admitting: Emergency Medicine

## 2020-04-27 DIAGNOSIS — X58XXXA Exposure to other specified factors, initial encounter: Secondary | ICD-10-CM | POA: Insufficient documentation

## 2020-04-27 DIAGNOSIS — C3431 Malignant neoplasm of lower lobe, right bronchus or lung: Secondary | ICD-10-CM | POA: Diagnosis not present

## 2020-04-27 DIAGNOSIS — R4182 Altered mental status, unspecified: Secondary | ICD-10-CM | POA: Insufficient documentation

## 2020-04-27 DIAGNOSIS — Z5321 Procedure and treatment not carried out due to patient leaving prior to being seen by health care provider: Secondary | ICD-10-CM | POA: Insufficient documentation

## 2020-04-27 LAB — URINALYSIS, ROUTINE W REFLEX MICROSCOPIC
Glucose, UA: NEGATIVE mg/dL
Hgb urine dipstick: NEGATIVE
Ketones, ur: NEGATIVE mg/dL
Leukocytes,Ua: NEGATIVE
Nitrite: NEGATIVE
Protein, ur: NEGATIVE mg/dL
Specific Gravity, Urine: 1.028 (ref 1.005–1.030)
pH: 5 (ref 5.0–8.0)

## 2020-04-27 LAB — CBC
HCT: 47.2 % (ref 39.0–52.0)
Hemoglobin: 15.4 g/dL (ref 13.0–17.0)
MCH: 31.4 pg (ref 26.0–34.0)
MCHC: 32.6 g/dL (ref 30.0–36.0)
MCV: 96.1 fL (ref 80.0–100.0)
Platelets: 174 10*3/uL (ref 150–400)
RBC: 4.91 MIL/uL (ref 4.22–5.81)
RDW: 13.3 % (ref 11.5–15.5)
WBC: 7.5 10*3/uL (ref 4.0–10.5)
nRBC: 0 % (ref 0.0–0.2)

## 2020-04-27 LAB — BASIC METABOLIC PANEL
Anion gap: 9 (ref 5–15)
BUN: 12 mg/dL (ref 8–23)
CO2: 25 mmol/L (ref 22–32)
Calcium: 9.2 mg/dL (ref 8.9–10.3)
Chloride: 105 mmol/L (ref 98–111)
Creatinine, Ser: 1.27 mg/dL — ABNORMAL HIGH (ref 0.61–1.24)
GFR, Estimated: >60 mL/min (ref >60)
Glucose, Bld: 121 mg/dL — ABNORMAL HIGH (ref 70–99)
Potassium: 3.8 mmol/L (ref 3.5–5.1)
Sodium: 139 mmol/L (ref 135–145)

## 2020-04-27 NOTE — ED Triage Notes (Signed)
Per ems, pt was hanging out with friends when he slumped over in chair. No fall/injuries. + LOC. EMS VSS, HR 65, BP 120/70, CBG 119, SpO2 95%. Orthostatic slightly with ems. No pain/complaints.

## 2020-04-28 NOTE — ED Notes (Signed)
Pt & pt's family state they will be leaving to see family doctor in the morning. This RN advocated for pt to stay & be seen, pt choosing to leave. Last set of vital signs taken & recorded, pt leaving w family

## 2020-05-19 ENCOUNTER — Ambulatory Visit
Admission: RE | Admit: 2020-05-19 | Discharge: 2020-05-19 | Disposition: A | Discharge status: Home / Self Care | Payer: Medicare Other | Source: Ambulatory Visit | Attending: Oncology | Admitting: Oncology

## 2020-05-19 ENCOUNTER — Other Ambulatory Visit: Payer: Self-pay

## 2020-05-19 DIAGNOSIS — C3431 Malignant neoplasm of lower lobe, right bronchus or lung: Secondary | ICD-10-CM | POA: Diagnosis present

## 2020-05-20 ENCOUNTER — Other Ambulatory Visit: Payer: Self-pay

## 2020-05-20 DIAGNOSIS — C3431 Malignant neoplasm of lower lobe, right bronchus or lung: Secondary | ICD-10-CM

## 2020-05-21 ENCOUNTER — Inpatient Hospital Stay (HOSPITAL_BASED_OUTPATIENT_CLINIC_OR_DEPARTMENT_OTHER): Payer: Medicare Other | Admitting: Oncology

## 2020-05-21 ENCOUNTER — Inpatient Hospital Stay: Payer: Medicare Other

## 2020-05-21 ENCOUNTER — Encounter: Payer: Self-pay | Admitting: Oncology

## 2020-05-21 VITALS — BP 140/87 | HR 69 | Temp 98.0°F | Wt 166.5 lb

## 2020-05-21 DIAGNOSIS — C3431 Malignant neoplasm of lower lobe, right bronchus or lung: Secondary | ICD-10-CM | POA: Diagnosis not present

## 2020-05-21 DIAGNOSIS — Z95828 Presence of other vascular implants and grafts: Secondary | ICD-10-CM

## 2020-05-21 DIAGNOSIS — J449 Chronic obstructive pulmonary disease, unspecified: Secondary | ICD-10-CM

## 2020-05-21 LAB — CBC WITH DIFFERENTIAL/PLATELET
Abs Immature Granulocytes: 0.04 10*3/uL (ref 0.00–0.07)
Basophils Absolute: 0.1 10*3/uL (ref 0.0–0.1)
Basophils Relative: 1 %
Eosinophils Absolute: 0.1 10*3/uL (ref 0.0–0.5)
Eosinophils Relative: 2 %
HCT: 42.7 % (ref 39.0–52.0)
Hemoglobin: 14.6 g/dL (ref 13.0–17.0)
Immature Granulocytes: 1 %
Lymphocytes Relative: 23 %
Lymphs Abs: 1.3 10*3/uL (ref 0.7–4.0)
MCH: 31.7 pg (ref 26.0–34.0)
MCHC: 34.2 g/dL (ref 30.0–36.0)
MCV: 92.8 fL (ref 80.0–100.0)
Monocytes Absolute: 0.6 10*3/uL (ref 0.1–1.0)
Monocytes Relative: 10 %
Neutro Abs: 3.6 10*3/uL (ref 1.7–7.7)
Neutrophils Relative %: 63 %
Platelets: 165 10*3/uL (ref 150–400)
RBC: 4.6 MIL/uL (ref 4.22–5.81)
RDW: 13.4 % (ref 11.5–15.5)
WBC: 5.7 10*3/uL (ref 4.0–10.5)
nRBC: 0 % (ref 0.0–0.2)

## 2020-05-21 LAB — COMPREHENSIVE METABOLIC PANEL
ALT: 21 U/L (ref 0–44)
AST: 21 U/L (ref 15–41)
Albumin: 3.7 g/dL (ref 3.5–5.0)
Alkaline Phosphatase: 104 U/L (ref 38–126)
Anion gap: 7 (ref 5–15)
BUN: 9 mg/dL (ref 8–23)
CO2: 28 mmol/L (ref 22–32)
Calcium: 8.6 mg/dL — ABNORMAL LOW (ref 8.9–10.3)
Chloride: 102 mmol/L (ref 98–111)
Creatinine, Ser: 1 mg/dL (ref 0.61–1.24)
GFR, Estimated: >60 mL/min (ref >60)
Glucose, Bld: 108 mg/dL — ABNORMAL HIGH (ref 70–99)
Potassium: 3.9 mmol/L (ref 3.5–5.1)
Sodium: 137 mmol/L (ref 135–145)
Total Bilirubin: 0.8 mg/dL (ref 0.3–1.2)
Total Protein: 6.7 g/dL (ref 6.5–8.1)

## 2020-05-21 NOTE — Progress Notes (Signed)
Hematology/Oncology follow up note [facility] [phone] [phone]   Patient Care Team: Baxter Hire, MD as PCP - General (Internal Medicine) Sherren Mocha, MD as PCP - Cardiology (Cardiology) Telford Nab, RN as Registered Nurse  REFERRING PROVIDER: Dr.Fleming REASON FOR VISIT:  Follow-up for management of stage III non-small cell lung cancer   HISTORY OF PRESENTING ILLNESS:  Timothy [facility] is a  65 y.o.  male with PMH listed below who was referred to me for evaluation of lung mass.  Patient is a former smoker quit smoking 7 years ago.  40-pack-year. Patient chest lung cancer screening on 03/11/2018. CT scan showed 2.4 cm right lower lobe nodule with a new necrotic.  Subcarinal lymph node. PET scan showed hypermetabolic right lower pulmonary nodule, consistent with primary bronchogenic carcinoma.  Fluid density subcarinal structure demonstrate no significant hypermetabolism.  Although this could represent an incidental benign lesion such as a cyst given the interval development since 02/24/2017, necrotic adenopathy cannot be excluded and tissue sampling should be considered.  # underwent biopsy via bronchoscopy.  Subcarinal lymph node biopsy positive for non-small cell lung cancer, favoring poorly differentiated adenocarcinoma.  Stage IIIA T1c N2M0 #Mediport was placed by Dr. Genevive Bi  # #Elevated alkaline phosphatase. Patient has been evaluated by gastroenterology.  Liver biopsy showed mild sinusoidal dilation and congestion.  Minimal changes suggestive of hepatic process. Iron stain demonstrate minimal focal hepatocellular iron. I discussed with Dr.Wohl via secure chat about patient's pathology result.  No restriction of proceeding with immunotherapy.  Continue watch for the labs.   # Cancer Treatment 04/26/2018 -06/11/2018 Concurrent Chemotherapy [weekly Carbo and Taxol] with RT.  07/10/2018 Started on maintenance Durvalumab. Finished  immunotherapy in May 2021 INTERVAL HISTORY Timothy Yoder is a 65 y.o. male who has above history reviewed by me today presents for follow up visit for management of stage III non-small cell lung cancer.   No new complaint. Chronic SOB, stable, not worse.   Review of Systems  Constitutional: Negative for appetite change, chills, diaphoresis, fatigue, fever and unexpected weight change.  HENT:   Negative for hearing loss, lump/mass, nosebleeds, sore throat and voice change.   Eyes: Negative for eye problems and icterus.  Respiratory: Positive for shortness of breath. Negative for chest tightness, cough, hemoptysis and wheezing.   Cardiovascular: Negative for chest pain and leg swelling.  Gastrointestinal: Negative for abdominal distention, abdominal pain, blood in stool, diarrhea, nausea and rectal pain.  Endocrine: Negative for hot flashes.  Genitourinary: Negative for bladder incontinence, difficulty urinating, dysuria, frequency, hematuria and nocturia.   Musculoskeletal: Negative for arthralgias, back pain, flank pain, gait problem and myalgias.  Skin: Negative for itching and rash.  Neurological: Negative for dizziness, gait problem, headaches, light-headedness, numbness and seizures.  Hematological: Negative for adenopathy. Does not bruise/bleed easily.  Psychiatric/Behavioral: Negative for confusion and decreased concentration. The patient is not nervous/anxious.    MEDICAL HISTORY:  Past Medical History:  Diagnosis Date  . BPH (benign prostatic hyperplasia)   . CAD (coronary artery disease)    a. 04/2014 anterolateral STEMI/PCI: LM nl, LAD 99m(2.75x16 Promus DES, D1 50p, LCX nl, RI nl, OM1 100 small/chronic, RCA dominant, [id number](FFR 0.90->Med Rx), RPDA 50, RPLA 70, EF 60%.  . CMarland KitchenPD (chronic obstructive pulmonary disease) (HCC)    Dr FleRaul Del Former tobacco use   . Gastroesophageal reflux disease   . HTN (hypertension)   . Hyperlipidemia   . Hypokalemia   . Malignant  neoplasm  of lung ([place]) 04/01/2018  . Myocardial infarction ([place]) 04/2014   Dr Sherren Mocha  . Stented coronary artery     SURGICAL HISTORY: Past Surgical History:  Procedure Laterality Date  . CARDIAC CATHETERIZATION    . CHOLECYSTECTOMY N/A 02/15/2016   Procedure: LAPAROSCOPIC CHOLECYSTECTOMY WITH INTRAOPERATIVE CHOLANGIOGRAM;  Surgeon: Leonie Green, MD;  Location: [facility] ORS;  Service: General;  Laterality: N/A;  . COLONOSCOPY    . COLONOSCOPY WITH PROPOFOL N/A 08/02/2015   Procedure: COLONOSCOPY WITH PROPOFOL;  Surgeon: Manya Silvas, MD;  Location: [facility];  Service: Endoscopy;  Laterality: N/A;  . CORONARY ANGIOPLASTY  04/2014   STENT  . ENDOBRONCHIAL ULTRASOUND N/A 03/26/2018   Procedure: ENDOBRONCHIAL ULTRASOUND;  Surgeon: Flora Lipps, MD;  Location: [facility] ORS;  Service: Cardiopulmonary;  Laterality: N/A;  . ESOPHAGOGASTRODUODENOSCOPY (EGD) WITH PROPOFOL N/A 08/02/2015   Procedure: ESOPHAGOGASTRODUODENOSCOPY (EGD) WITH PROPOFOL;  Surgeon: Manya Silvas, MD;  Location: [facility] ENDOSCOPY;  Service: Endoscopy;  Laterality: N/A;  . FLEXIBLE BRONCHOSCOPY N/A 03/26/2018   Procedure: FLEXIBLE BRONCHOSCOPY;  Surgeon: Flora Lipps, MD;  Location: [facility] ORS;  Service: Cardiopulmonary;  Laterality: N/A;  . KNEE ARTHROSCOPY Left   . LEFT HEART CATHETERIZATION WITH CORONARY ANGIOGRAM N/A 05/03/2014   Procedure: LEFT HEART CATHETERIZATION WITH CORONARY ANGIOGRAM;  Surgeon: Blane Ohara, MD;  Location: [facility] CATH LAB;  Service: Cardiovascular;  Laterality: N/A;  . PERCUTANEOUS CORONARY STENT INTERVENTION (PCI-S) N/A 05/05/2014   Procedure: PERCUTANEOUS CORONARY STENT INTERVENTION (PCI-S);  Surgeon: Peter M Martinique, MD;  Location: [facility] CATH LAB;  Service: Cardiovascular;  Laterality: N/A;  . PORTACATH PLACEMENT Left 04/04/2018   Procedure: INSERTION PORT-A-CATH;  Surgeon: [name], MD;  Location: [facility] ORS;  Service: General;  Laterality: Left;    SOCIAL HISTORY: Social History    Socioeconomic History  . Marital status: Married    Spouse name: Not on file  . Number of children: Not on file  . Years of education: Not on file  . Highest education level: Not on file  Occupational History  . Not on file  Tobacco Use  . Smoking status: Former Smoker    Packs/day: 1.00    Years: 40.00    Pack years: 40.00    Types: Cigarettes    Quit date: 02/14/2011    Years since quitting: 9.2  . Smokeless tobacco: Never Used  Vaping Use  . Vaping Use: Never used  Substance and Sexual Activity  . Alcohol use: No  . Drug use: No  . Sexual activity: Not on file  Other Topics Concern  . Not on file  Social History Narrative   The patient is married. He lives in [place]. He works full-time. He quit smoking in 2013.   Social Determinants of Health   Financial Resource Strain: Not on file  Food Insecurity: Not on file  Transportation Needs: Not on file  Physical Activity: Not on file  Stress: Not on file  Social Connections: Not on file  Intimate Partner Violence: Not on file    FAMILY HISTORY: Family History  Problem Relation Age of Onset  . CAD Mother 101       Died of an MI  . CAD Father 48       Died of a massive MI  . COPD Sister   . Lung cancer Brother   . Bone cancer Paternal Uncle   . Colon cancer Neg Hx   . Breast cancer Neg Hx     ALLERGIES:  has No Known Allergies.  MEDICATIONS:  Current Outpatient Medications  Medication Sig Dispense Refill  . acetaminophen (TYLENOL) 500 MG tablet Take 1,000 mg by mouth every 6 (six) hours as needed (for pain.).    Marland Kitchen albuterol (PROAIR HFA) 108 (90 Base) MCG/ACT inhaler Inhale 2 puffs into the lungs every 6 (six) hours as needed for wheezing or shortness of breath. 18 g 5  . aspirin EC 81 MG tablet Take 81 mg by mouth daily.    Marland Kitchen atorvastatin (LIPITOR) 80 MG tablet Take 1 tablet (80 mg total) by mouth daily at 6 PM. 90 tablet 3  . carvedilol (COREG) 6.25 MG tablet TAKE 1 TABLET TWICE A DAY (PLEASE KEEP  UPCOMING APPOINTMENT IN MAY WITH DR. Burt Knack BEFORE ANYMORE REFILLS, THANK YOU) 180 tablet 3  . lidocaine-prilocaine (EMLA) cream Apply small amount to port site 1 hour prior to [facility] being accessed and cover with plastic wrap 30 g 1  . omeprazole (PRILOSEC) 40 MG capsule Take 1 capsule (40 mg total) by mouth 2 (two) times daily. 180 capsule 1  . ondansetron (ZOFRAN) 8 MG tablet PLEASE SEE ATTACHED FOR DETAILED DIRECTIONS    . tamsulosin (FLOMAX) 0.4 MG CAPS capsule Take 0.4 mg by mouth daily after supper.    . umeclidinium-vilanterol (ANORO ELLIPTA) 62.5-25 MCG/INH AEPB Inhale 1 puff into the lungs daily. 1 each 3  . finasteride (PROSCAR) 5 MG tablet Take 5 mg by mouth daily.    . nitroGLYCERIN (NITROSTAT) 0.4 MG SL tablet Place 1 tablet (0.4 mg total) under the tongue every 5 (five) minutes as needed for chest pain. X 3 doses (Patient not taking: Reported on 05/21/2020) 25 tablet 3   No current facility-administered medications for this visit.     PHYSICAL EXAMINATION: ECOG PERFORMANCE STATUS: 1 - Symptomatic but completely ambulatory Vitals:   05/21/20 0939  BP: 140/87  Pulse: 69  Temp: 98 F (36.7 C)  SpO2: 97%   Filed Weights   05/21/20 0939  Weight: 166 lb 8 oz (75.5 kg)    Physical Exam Constitutional:      General: He is not in acute distress. HENT:     Head: Normocephalic and atraumatic.  Eyes:     General: No scleral icterus. Cardiovascular:     Rate and Rhythm: Normal rate and regular rhythm.     Heart sounds: Normal heart sounds.  Pulmonary:     Effort: Pulmonary effort is normal. No respiratory distress.     Breath sounds: No wheezing.  Abdominal:     General: Bowel sounds are normal. There is no distension.     Palpations: Abdomen is soft.  Musculoskeletal:        General: No deformity. Normal range of motion.     Cervical back: Normal range of motion and neck supple.  Skin:    General: Skin is warm and dry.     Findings: No erythema or rash.   Neurological:     Mental Status: He is alert and oriented to person, place, and time. Mental status is at baseline.     Cranial Nerves: No cranial nerve deficit.     Coordination: Coordination normal.  Psychiatric:        Mood and Affect: Mood normal.      LABORATORY DATA:  I have reviewed the data as listed Lab Results  Component Value Date   WBC 5.7 05/21/2020   HGB 14.6 05/21/2020   HCT 42.7 05/21/2020   MCV 92.8 05/21/2020   PLT 165 05/21/2020   Recent  Labs    08/26/19 0803 09/09/19 0833 10/20/19 0921 02/11/20 0939 04/27/20 2107 05/21/20 0929  NA 136 138 139 140 139 137  K 3.9 3.7 4.4 4.0 3.8 3.9  CL 103 105 103 106 105 102  CO2 _0 GLUCOSE 114* 104* 122* 76 121* 108*  BUN _1 CREATININE 1.04 1.05 1.15 0.99 1.27* 1.00  CALCIUM 8.6* 8.4* 8.9 9.0 9.2 8.6*  GFRNONAA >60 >60 >60 >60 >60 >60  GFRAA >60 >60 >60  --   --   --   PROT 6.6 6.6 6.9 6.7  --  6.7  ALBUMIN 3.8 3.7 4.0 3.9  --  3.7  AST _2 --  21  ALT _3 --  21  ALKPHOS 144* 130* 126 103  --  104  BILITOT 1.0 0.6 0.7 0.8  --  0.8   Iron/TIBC/Ferritin/ %Sat    Component Value Date/Time   IRON 41 (L) 11/21/2018 0856   TIBC 260 11/21/2018 0856   FERRITIN 150 11/21/2018 0856   IRONPCTSAT 16 (L) 11/21/2018 0856    RADIOGRAPHIC STUDIES: I have personally reviewed the radiological images as listed and agreed with the findings in the report.  CT Chest Wo Contrast  Result Date: 05/19/2020 CLINICAL DATA:  Restaging right lower lobe lung cancer diagnosed in 03/2018. Completed immunotherapy on 09/09/2019. EXAM: CT CHEST WITHOUT CONTRAST TECHNIQUE: Multidetector CT imaging of the chest was performed following the standard protocol without IV contrast. COMPARISON:  Multiple priors including most recent chest CT February 11, 2020 a which. FINDINGS: Cardiovascular: Left chest Port-A-Cath with tip at the superior cavoatrial junction. Aortic, great vessel and coronary  artery atherosclerosis. Normal size heart. No pericardial effusion. Mediastinum/Nodes: No pathologically enlarged mediastinal, hilar or axillary lymph nodes. Thyroid is unremarkable. Trachea appears patent and midline. Small hiatal hernia. Lungs/Pleura: No pleural effusion or pneumothorax. Stable moderate centrilobular emphysema with biapical and lingular scarring. Similar treatment change in the paramedian right lower lobe without evidence of local recurrence. Unchanged size of the 4 mm perifissural nodule (series 3, image 92). Stable 3 mm triangular nodule in the left lower lobe, likely intrapulmonary lymph node. Bilateral calcified granuloma. Upper Abdomen: Stable low-density lesion laterally in the dome of the right hepatic lobe. No adrenal mass. Cholecystectomy clips. Musculoskeletal: No chest wall mass or suspicious bone lesions identified. IMPRESSION: 1. Stable treatment change in the paramedian right lower lobe without evidence of local recurrence. 2. Stable pulmonary nodules.  No new or enlarging nodules. 3. Emphysema and aortic atherosclerosis. Aortic Atherosclerosis (ICD10-I70.0) and Emphysema (ICD10-J43.9). Electronically Signed   By: Dahlia Bailiff MD   On: 05/19/2020 09:03     ASSESSMENT & PLAN:  1. Malignant neoplasm of lower lobe of right lung ([place])   2. Chronic obstructive pulmonary disease, unspecified COPD type ([place])   3. Port-A-Cath in place   Cancer Staging Malignant neoplasm of lower lobe of right lung [facility] [place]) Staging form: Lung, AJCC 8th Edition - Clinical stage from 05/13/2018: Stage IIIA (cT1c, cN2, cM0) - Signed by Earlie Server, MD on 05/13/2018  #Stage IIIA lung adenocarcinoma Labs are reviewed and discussed with patient. CT images were independently reviewed by me and discussed with patient.  Stable, no signs of recurrence or metastatic disease.  Discussed with the patient and recommend to continue CT chest surveillance every 3 months and to he reaches 2 years after concurrent  chemoradiation.  Then will proceed every 6  months CT until year 5. He agrees with the plan Port-A-Cath in place, continue port flush every 6 to 8 weeks.  He may have Mediport taken out after 2 years. COPD, continue albuterol inhaler  Follow up in 3 months.  Earlie Server, MD, PhD 05/21/20

## 2020-05-21 NOTE — Progress Notes (Signed)
Pt here for follow up. No new concerns voiced.   

## 2020-06-15 ENCOUNTER — Other Ambulatory Visit: Payer: Self-pay

## 2020-06-15 ENCOUNTER — Inpatient Hospital Stay: Payer: Medicare Other | Attending: Oncology

## 2020-06-15 DIAGNOSIS — Z452 Encounter for adjustment and management of vascular access device: Secondary | ICD-10-CM | POA: Diagnosis not present

## 2020-06-15 DIAGNOSIS — Z85118 Personal history of other malignant neoplasm of bronchus and lung: Secondary | ICD-10-CM | POA: Diagnosis present

## 2020-06-15 DIAGNOSIS — Z95828 Presence of other vascular implants and grafts: Secondary | ICD-10-CM

## 2020-06-15 MED ORDER — SODIUM CHLORIDE 0.9% FLUSH
10.0000 mL | INTRAVENOUS | Status: DC | PRN
  Administered 2020-06-15: 10 mL via INTRAVENOUS
  Filled 2020-06-15: qty 10

## 2020-06-15 MED ORDER — HEPARIN SOD (PORK) LOCK FLUSH 100 UNIT/ML IV SOLN
500.0000 [IU] | Freq: Once | INTRAVENOUS | Status: AC
  Administered 2020-06-15: 500 [IU] via INTRAVENOUS
  Filled 2020-06-15: qty 5

## 2020-06-15 MED ORDER — HEPARIN SOD (PORK) LOCK FLUSH 100 UNIT/ML IV SOLN
INTRAVENOUS | Status: AC
  Filled 2020-06-15: qty 5

## 2020-07-04 NOTE — Progress Notes (Signed)
@Patient  ID: Timothy Yoder, male    DOB: 09/22/1955, 65 y.o.   MRN: [id number]  Chief Complaint  Patient presents with  . Follow-up    C/o sob with exertion and non prod cough    Referring provider: Baxter Hire, MD  HPI:  65 y.o. male former smoker followed in our office for moderate COPD.   PMH: Stage III non-small cell lung CA, CAD, MI, HTN Smoker/ Smoking History: Former, quit 2012, 40-pack-year.  Maintenance:  Anoro Ellipta  Pt of: Timothy Yoder     CT on 03/11/2018 showed 2.4 cm right lower lobe nodule with a new necrotic. Subcarinal lymph node. Biopsy via bronchoscopy of Subcarinal lymph node biopsy positive for non-small cell lung cancer. Diagnosed in December 2019 with stage III non-small cell lung cancer,adenocarcinoma in right lower lobe. 04/26/2018 -06/11/2018 Concurrent Chemotherapy [weekly Carbo and Taxol] with RT. 07/10/2018 Started on maintenance Durvalumab. Finished immunotherapy in May 2021.  05/21/2020- Had Hem/Onc followed up with Timothy Yoder. CT from 05/19/2020 Timothy Yoder reported as stable, no signs of recurrence or metastatic disease. Timothy Yoder recommended:  continue CT chest surveillance every 3 months and to he reaches 2 years after concurrent chemoradiation.  Then will proceed every 6 months CT until year 5.  Last seen by Pulm on 11/2019 for 2 month COPD follow up. Reported feeling better , more energy. He was working  part-time. Walking more. No increased cough or congestion.  Felt ANORO was working well. Recommendations were to continue on ANORO 1 puff daily , rinse after use. Continue activity as tolerated. Eat high protein diet. F/U in 21mths with Timothy Yoder. F/U with Oncology as scheduled.   07/05/2020  - Visit  65 y.o. male former smoker followed in our office for moderate COPD. Quit smoking in 2012.   Reports today that he is doing well. Continues to use Anoro inhaler once daily. Staying active. No recent ABX or steroid use. No recent SABA use, last used several  months ago. No coughing, fevers, or wheezing reported.    Questionaires / Pulmonary Flowsheets:   MMRC: mMRC Dyspnea Scale mMRC Score  07/05/2020 2    Tests:   FENO:  No results found for: NITRICOXIDE  PFT:  10/06/2019: FEV1 was 56%, ratio 53, FVC 79%, no significant bronchodilator response.  DLCO was 41%    WALK:  No flowsheet data found.  Imaging: No results found.  Lab Results:  08/22/2019: alpha-1 testing is normal with a phenotype of MM and normal level.    CBC    Component Value Date/Time   WBC 5.7 05/21/2020 0929   RBC 4.60 05/21/2020 0929   HGB 14.6 05/21/2020 0929   HGB 15.1 05/03/2014 1457   HCT 42.7 05/21/2020 0929   HCT 44.8 05/03/2014 1457   PLT 165 05/21/2020 0929   PLT 234 05/03/2014 1457   MCV 92.8 05/21/2020 0929   MCV 96 05/03/2014 1457   MCH 31.7 05/21/2020 0929   MCHC 34.2 05/21/2020 0929   RDW 13.4 05/21/2020 0929   RDW 13.3 05/03/2014 1457   LYMPHSABS 1.3 05/21/2020 0929   MONOABS 0.6 05/21/2020 0929   EOSABS 0.1 05/21/2020 0929   BASOSABS 0.1 05/21/2020 0929    BMET    Component Value Date/Time   NA 137 05/21/2020 0929   NA 140 08/12/2015 0827   NA 142 05/03/2014 1457   K 3.9 05/21/2020 0929   K 3.3 (L) 05/03/2014 1457   CL 102 05/21/2020 0929   CL 107 05/03/2014  1457   CO2 28 05/21/2020 0929   CO2 26 05/03/2014 1457   GLUCOSE 108 (H) 05/21/2020 0929   GLUCOSE 89 05/03/2014 1457   BUN 9 05/21/2020 0929   BUN 10 08/12/2015 0827   BUN 10 05/03/2014 1457   CREATININE 1.00 05/21/2020 0929   CREATININE 1.11 05/03/2014 1457   CALCIUM 8.6 (L) 05/21/2020 0929   CALCIUM 8.3 (L) 05/03/2014 1457   GFRNONAA >60 05/21/2020 0929   GFRNONAA >60 05/03/2014 1457   GFRAA >60 10/20/2019 0921   GFRAA >60 05/03/2014 1457    BNP    Component Value Date/Time   BNP 20.1 05/03/2014 1928    ProBNP No results found for: PROBNP  Specialty Problems      Pulmonary Problems   Chronic obstructive pulmonary disease (HCC)   Lung mass    Malignant neoplasm of lower lobe of right lung (HCC)      No Known Allergies  Immunization History  Administered Date(s) Administered  . Influenza,inj,Quad PF,6+ Mos 05/24/2016, 01/27/2019  . Influenza,inj,quad, With Preservative 02/25/2018  . Influenza-Unspecified 02/26/2017, 02/26/2018  . PFIZER(Purple Top)SARS-COV-2 Vaccination 06/05/2019, 07/01/2019, 04/26/2020  . Tdap 06/05/2014  . Zoster 05/25/2010    Past Medical History:  Diagnosis Date  . BPH (benign prostatic hyperplasia)   . CAD (coronary artery disease)    a. 04/2014 anterolateral STEMI/PCI: LM nl, LAD 64m (2.75x16 Promus DES, D1 50p, LCX nl, RI nl, OM1 100 small/chronic, RCA dominant, 20-35m, 41m/d (FFR 0.90->Med Rx), RPDA 50, RPLA 70, EF 60%.  Marland Kitchen COPD (chronic obstructive pulmonary disease) (HCC)    Dr Timothy Yoder  . Former tobacco use   . Gastroesophageal reflux disease   . HTN (hypertension)   . Hyperlipidemia   . Hypokalemia   . Malignant neoplasm of lung ([place]) 04/01/2018  . Myocardial infarction ([place]) 04/2014   Dr Timothy Yoder  . Stented coronary artery     Tobacco History: Social History   Tobacco Use  Smoking Status Former Smoker  . Packs/day: 1.00  . Years: 35.00  . Pack years: 35.00  . Types: Cigarettes  . Quit date: 02/14/2011  . Years since quitting: 9.3  Smokeless Tobacco Never Used   Counseling given: Not Answered   Continue to not smoke  Outpatient Encounter Medications as of 07/05/2020  Medication Sig  . acetaminophen (TYLENOL) 500 MG tablet Take 1,000 mg by mouth every 6 (six) hours as needed (for pain.).  Marland Kitchen aspirin EC 81 MG tablet Take 81 mg by mouth daily.  Marland Kitchen atorvastatin (LIPITOR) 80 MG tablet Take 1 tablet (80 mg total) by mouth daily at 6 PM.  . carvedilol (COREG) 6.25 MG tablet TAKE 1 TABLET TWICE A DAY (PLEASE KEEP UPCOMING APPOINTMENT IN MAY WITH DR. Burt Yoder BEFORE ANYMORE REFILLS, THANK YOU)  . finasteride (PROSCAR) 5 MG tablet Take 5 mg by mouth daily.  Marland Kitchen lidocaine-prilocaine  (EMLA) cream Apply small amount to port site 1 hour prior to [facility] being accessed and cover with plastic wrap  . nitroGLYCERIN (NITROSTAT) 0.4 MG SL tablet Place 1 tablet (0.4 mg total) under the tongue every 5 (five) minutes as needed for chest pain. X 3 doses  . omeprazole (PRILOSEC) 40 MG capsule Take 1 capsule (40 mg total) by mouth 2 (two) times daily.  . ondansetron (ZOFRAN) 8 MG tablet PLEASE SEE ATTACHED FOR DETAILED DIRECTIONS  . tamsulosin (FLOMAX) 0.4 MG CAPS capsule Take 0.4 mg by mouth daily after supper.  . [DISCONTINUED] albuterol (PROAIR HFA) 108 (90 Base) MCG/ACT inhaler Inhale 2 puffs  into the lungs every 6 (six) hours as needed for wheezing or shortness of breath.  . [DISCONTINUED] umeclidinium-vilanterol (ANORO ELLIPTA) 62.5-25 MCG/INH AEPB Inhale 1 puff into the lungs daily.  . [DISCONTINUED] albuterol (PROAIR HFA) 108 (90 Base) MCG/ACT inhaler Inhale 2 puffs into the lungs every 6 (six) hours as needed for wheezing or shortness of breath.  . [DISCONTINUED] prochlorperazine (COMPAZINE) 10 MG tablet Take 1 tablet (10 mg total) by mouth every 6 (six) hours as needed (Nausea or vomiting).  . [DISCONTINUED] umeclidinium-vilanterol (ANORO ELLIPTA) 62.5-25 MCG/INH AEPB Inhale 1 puff into the lungs daily.   No facility-administered encounter medications on file as of 07/05/2020.     Review of Systems  Review of Systems  Constitutional: Negative.   HENT: Negative.   Eyes: Negative.   Respiratory: Negative.   Cardiovascular: Negative.   Gastrointestinal: Negative.   Skin: Negative.   Neurological: Negative.   Psychiatric/Behavioral: Negative.      Physical Exam  BP 138/72 (BP Location: Left Arm, Cuff Size: Normal)   Pulse 71   Temp (!) 97.1 F (36.2 C) (Temporal)   Ht 5\' 8"  (1.727 m)   Wt 166 lb (75.3 kg)   SpO2 97%   BMI 25.24 kg/m   Wt Readings from Last 5 Encounters:  07/05/20 166 lb (75.3 kg)  05/21/20 166 lb 8 oz (75.5 kg)  04/27/20 160 lb (72.6 kg)   02/13/20 159 lb 14.4 oz (72.5 kg)  12/09/19 164 lb 3.2 oz (74.5 kg)    BMI Readings from Last 5 Encounters:  07/05/20 25.24 kg/m  05/21/20 25.32 kg/m  04/27/20 24.33 kg/m  02/13/20 24.31 kg/m  12/09/19 24.97 kg/m     Physical Exam Vitals reviewed.  Constitutional:      Appearance: Normal appearance.  HENT:     Head: Normocephalic.     Nose: Nose normal.     Mouth/Throat:     Mouth: Mucous membranes are moist.  Cardiovascular:     Rate and Rhythm: Normal rate and regular rhythm.     Pulses: Normal pulses.  Pulmonary:     Effort: Pulmonary effort is normal.     Breath sounds: Normal breath sounds.  Abdominal:     General: Bowel sounds are normal.     Palpations: Abdomen is soft.  Musculoskeletal:        General: Normal range of motion.     Cervical back: Normal range of motion.  Skin:    General: Skin is warm and dry.  Neurological:     General: No focal deficit present.     Mental Status: He is alert and oriented to person, place, and time.  Psychiatric:        Mood and Affect: Mood normal.        Behavior: Behavior normal.        Thought Content: Thought content normal.       Assessment & Plan:   Chronic obstructive pulmonary disease (HCC) Stable on Anoro Ellipta 1 puff daily  Plan: Continue to use Anoro 1 puff daily, rinse mouth after use Continue to keep active Use rescue inhaler as needed Call office if you are prescribed ABX for respiratory symptoms or steroids    Malignant neoplasm of lower lobe of right lung [facility]) Has completed Chemo and Finished Durvalumab May 2021. Saw Hem/Onc, Timothy Yoder last on 05/21/20.    Plan: Continue to follow with Timothy Yoder as directed.   Continue with Timothy Yoder recommended: CT chest surveillance every 3 months and  to he reaches 2 years after concurrent chemoradiation.  Then will proceed every 6 months CT until year 5.  Continue to assess O2 sats if drops <88% call office    Healthcare maintenance Did not  receive influenza vaccine this season. Currently up to date with Covid-19 vaccines including booster. Patient is unsure if he has received his pneumonia vaccine. He has a physical tomorrow with his PCP, he is encouraged to follow up tomorrow and proceed with obtaining this vaccine.   Plan: Obtain pneumonia vaccine is haven't already received Continue to stay active     Return in about 1 year (around 07/05/2021), or if symptoms worsen or fail to improve, for [facility] - Timothy Yoder, Follow up with Timothy Yoder.   Lauraine Rinne, NP 07/05/2020   This appointment required 34 minutes of patient care (this includes precharting, chart review, review of results, face-to-face care, etc.).

## 2020-07-05 ENCOUNTER — Encounter: Payer: Self-pay | Admitting: Pulmonary Disease

## 2020-07-05 ENCOUNTER — Other Ambulatory Visit: Payer: Self-pay

## 2020-07-05 ENCOUNTER — Ambulatory Visit (INDEPENDENT_AMBULATORY_CARE_PROVIDER_SITE_OTHER): Payer: Medicare Other | Admitting: Pulmonary Disease

## 2020-07-05 VITALS — BP 138/72 | HR 71 | Temp 97.1°F | Ht 68.0 in | Wt 166.0 lb

## 2020-07-05 DIAGNOSIS — J449 Chronic obstructive pulmonary disease, unspecified: Secondary | ICD-10-CM | POA: Diagnosis not present

## 2020-07-05 DIAGNOSIS — C3431 Malignant neoplasm of lower lobe, right bronchus or lung: Secondary | ICD-10-CM

## 2020-07-05 DIAGNOSIS — Z0000 Encounter for general adult medical examination without abnormal findings: Secondary | ICD-10-CM | POA: Insufficient documentation

## 2020-07-05 MED ORDER — ANORO ELLIPTA 62.5-25 MCG/INH IN AEPB: 1.0000 | INHALATION_SPRAY | Freq: Every day | RESPIRATORY_TRACT | 4 refills | Status: DC

## 2020-07-05 MED ORDER — ALBUTEROL SULFATE HFA 108 (90 BASE) MCG/ACT IN AERS: 2.0000 | INHALATION_SPRAY | Freq: Four times a day (QID) | RESPIRATORY_TRACT | 11 refills | Status: DC | PRN

## 2020-07-05 MED ORDER — ALBUTEROL SULFATE HFA 108 (90 BASE) MCG/ACT IN AERS: 2.0000 | INHALATION_SPRAY | Freq: Four times a day (QID) | RESPIRATORY_TRACT | 4 refills | Status: DC | PRN

## 2020-07-05 NOTE — Assessment & Plan Note (Signed)
Did not receive influenza vaccine this season. Currently up to date with Covid-19 vaccines including booster. Patient is unsure if he has received his pneumonia vaccine. He has a physical tomorrow with his PCP, he is encouraged to follow up tomorrow and proceed with obtaining this vaccine.   Plan: Obtain pneumonia vaccine is haven't already received Continue to stay active

## 2020-07-05 NOTE — Assessment & Plan Note (Signed)
Stable on Anoro Ellipta 1 puff daily  Plan: Continue to use Anoro 1 puff daily, rinse mouth after use Continue to keep active Use rescue inhaler as needed Call office if you are prescribed ABX for respiratory symptoms or steroids

## 2020-07-05 NOTE — Patient Instructions (Addendum)
You were seen today by Lauraine Rinne, NP  for:   1. Chronic obstructive pulmonary disease, unspecified COPD type (HCC)  Anoro Ellipta  >>> Take 1 puff daily in the morning right when you wake up >>>Rinse your mouth out after use >>>This is a daily maintenance inhaler, NOT a rescue inhaler >>>Contact our office if you are having difficulties affording or obtaining this medication >>>It is important for you to be able to take this daily and not miss any doses   Only use your albuterol as a rescue medication to be used if you can't catch your breath by resting or doing a relaxed purse lip breathing pattern.  - The less you use it, the better it will work when you need it. - Ok to use up to 2 puffs  every 4 hours if you must but call for immediate appointment if use goes up over your usual need - Don't leave home without it !!  (think of it like the spare tire for your car)   Note your daily symptoms > remember "red flags" for COPD:   >>>Increase in cough >>>increase in sputum production >>>increase in shortness of breath or activity  intolerance.   If you notice these symptoms, please call the office to be seen.    2. Malignant neoplasm of lower lobe of right lung [facility])  Continue follow-up with oncology  Continue CT imaging as recommended by oncology  3. Healthcare maintenance  Please check with your primary care provider if you have had the pneumonia vaccine-Pneumovax 23  If you have not received this already would recommend that you receive it as you are over the age of 80 and have a history of COPD  Please let our office know either way you can receive this from primary care or from our office  Follow Up:    Return in about 1 year (around 07/05/2021), or if symptoms worsen or fail to improve, for [facility] - Dr. Mortimer Fries.   Notification of test results are managed in the following manner: If there are  any recommendations or changes to the  plan of care discussed in  office today,  we will contact you and let you know what they are. If you do not hear from [place], then your results are normal and you can view them through your  MyChart account , or a letter will be sent to you. Thank you again for trusting [place] with your care  - Thank you, [facility]    It is flu season:   >>> Best ways to protect herself from the flu: Receive the yearly flu vaccine, practice good hand hygiene washing with soap and also using hand sanitizer when available, eat a nutritious meals, get adequate rest, hydrate appropriately       Please contact the office if your symptoms worsen or you have concerns that you are not improving.   Thank you for choosing [facility] for your healthcare, and for allowing [place] to partner with you on your healthcare journey. I am thankful to be able to provide care to you today.   Wyn Quaker FNP-C    COPD and Physical Activity Chronic obstructive pulmonary disease (COPD) is a long-term (chronic) condition that affects the lungs. COPD is a general term that can be used to describe many different lung problems that cause lung swelling (inflammation) and limit airflow, including chronic bronchitis and emphysema. The main symptom of COPD is shortness of breath, which makes it  harder to do even simple tasks. This can also make it harder to exercise and be active. Talk with your health care provider about treatments to help you breathe better and actions you can take to prevent breathing problems during physical activity. What are the benefits of exercising with COPD? Exercising regularly is an important part of a healthy lifestyle. You can still exercise and do physical activities even though you have COPD. Exercise and physical activity improve your shortness of breath by increasing blood flow (circulation). This causes your heart to pump more oxygen through your body. Moderate exercise can improve your:  Oxygen use.  Energy  level.  Shortness of breath.  Strength in your breathing muscles.  Heart health.  Sleep.  Self-esteem and feelings of self-worth.  Depression, stress, and anxiety levels. Exercise can benefit everyone with COPD. The severity of your disease may affect how hard you can exercise, especially at first, but everyone can benefit. Talk with your health care provider about how much exercise is safe for you, and which activities and exercises are safe for you.   What actions can I take to prevent breathing problems during physical activity?  Sign up for a pulmonary rehabilitation program. This type of program may include: ? Education about lung diseases. ? Exercise classes that teach you how to exercise and be more active while improving your breathing. This usually involves:  Exercise using your lower extremities, such as a stationary bicycle.  About 30 minutes of exercise, 2 to 5 times per week, for 6 to 12 weeks  Strength training, such as push ups or leg lifts. ? Nutrition education. ? Group classes in which you can talk with others who also have COPD and learn ways to manage stress.  If you use an oxygen tank, you should use it while you exercise. Work with your health care provider to adjust your oxygen for your physical activity. Your resting flow rate is different from your flow rate during physical activity.  While you are exercising: ? Take slow breaths. ? Pace yourself and do not try to go too fast. ? Purse your lips while breathing out. Pursing your lips is similar to a kissing or whistling position. ? If doing exercise that uses a quick burst of effort, such as weight lifting:  Breathe in before starting the exercise.  Breathe out during the hardest part of the exercise (such as raising the weights). Where to find support You can find support for exercising with COPD from:  Your health care provider.  A pulmonary rehabilitation program.  Your local health department  or community health programs.  Support groups, online or in-person. Your health care provider may be able to recommend support groups. Where to find more information You can find more information about exercising with COPD from:  American Lung Association: ClassInsider.se.  COPD Foundation: https://www.rivera.net/. Contact a health care provider if:  Your symptoms get worse.  You have chest pain.  You have nausea.  You have a fever.  You have trouble talking or catching your breath.  You want to start a new exercise program or a new activity. Summary  COPD is a general term that can be used to describe many different lung problems that cause lung swelling (inflammation) and limit airflow. This includes chronic bronchitis and emphysema.  Exercise and physical activity improve your shortness of breath by increasing blood flow (circulation). This causes your heart to provide more oxygen to your body.  Contact your health care provider before  starting any exercise program or new activity. Ask your health care provider what exercises and activities are safe for you. This information is not intended to replace advice given to you by your health care provider. Make sure you discuss any questions you have with your health care provider. Document Revised: 07/31/2018 Document Reviewed: 05/03/2017 Elsevier Patient Education  2021 [employer].

## 2020-07-05 NOTE — Assessment & Plan Note (Addendum)
Has completed Chemo and Finished Durvalumab May 2021. Saw Hem/Onc, Dr. Tasia Catchings last on 05/21/20.    Plan: Continue to follow with Dr. Tasia Catchings as directed.   Continue with Dr. Collie Siad recommended: CT chest surveillance every 3 months and to he reaches 2 years after concurrent chemoradiation.  Then will proceed every 6 months CT until year 5.  Continue to assess O2 sats if drops <88% call office

## 2020-07-12 ENCOUNTER — Encounter: Payer: Self-pay | Admitting: Radiation Oncology

## 2020-07-12 ENCOUNTER — Ambulatory Visit
Admission: RE | Admit: 2020-07-12 | Discharge: 2020-07-12 | Disposition: A | Discharge status: Home / Self Care | Payer: Medicare Other | Source: Ambulatory Visit | Attending: Radiation Oncology | Admitting: Radiation Oncology

## 2020-07-12 VITALS — BP 148/87 | HR 64 | Temp 96.0°F | Wt 168.0 lb

## 2020-07-12 DIAGNOSIS — Z923 Personal history of irradiation: Secondary | ICD-10-CM | POA: Insufficient documentation

## 2020-07-12 DIAGNOSIS — C3431 Malignant neoplasm of lower lobe, right bronchus or lung: Secondary | ICD-10-CM | POA: Diagnosis present

## 2020-07-12 DIAGNOSIS — Z9221 Personal history of antineoplastic chemotherapy: Secondary | ICD-10-CM | POA: Insufficient documentation

## 2020-07-12 NOTE — Progress Notes (Signed)
Radiation Oncology Follow up Note  Name: Timothy Yoder   Date:   07/12/2020 MRN:  [id number] DOB: 1955-11-25    This 65 y.o. male presents to the clinic today for 2-year follow-up status post concurrent chemoradiation therapy for stage IIIa non-small cell lung cancer of the right lower lobe.  REFERRING PROVIDER: Baxter Hire, MD  HPI: Patient is a 65 year old male now out 2 years having completed concurrent chemoradiation therapy for stage IIIa non-small cell lung cancer the right lower lobe seen today in routine follow-up he is doing well.  He specifically [place] cough hemoptysis or chest tightness..  He had CT scan of the chest back in January I have reviewed shows stable treatment changes in the paramedian right lower lobe with no evidence of local progression.  He currently uses an albuterol inhaler.  He has had his port removed  COMPLICATIONS OF TREATMENT: none  FOLLOW UP COMPLIANCE: keeps appointments   PHYSICAL EXAM:  BP (!) 148/87   Pulse 64   Temp (!) 96 F (35.6 C) (Tympanic)   Wt 168 lb (76.2 kg)   BMI 25.54 kg/m  Well-developed well-nourished patient in NAD. HEENT reveals PERLA, EOMI, discs not visualized.  Oral cavity is clear. No oral mucosal lesions are identified. Neck is clear without evidence of cervical or supraclavicular adenopathy. Lungs are clear to A&P. Cardiac examination is essentially unremarkable with regular rate and rhythm without murmur rub or thrill. Abdomen is benign with no organomegaly or masses noted. Motor sensory and DTR levels are equal and symmetric in the upper and lower extremities. Cranial nerves II through XII are grossly intact. Proprioception is intact. No peripheral adenopathy or edema is identified. No motor or sensory levels are noted. Crude visual fields are within normal range.  RADIOLOGY RESULTS: CT scans reviewed compatible with above-stated findings  PLAN: Present time patient is doing well 2 years out from concurrent chemoradiation  therapy for stage IIIa lung cancer with no evidence of disease.  And pleased with his overall progress.  I have asked to see him back in 1 year for follow-up.  He continues close follow-up care and surveillance by medical oncology.  Patient knows to call with any concerns.  I would like to take this opportunity to thank you for allowing me to participate in the care of your patient.Noreene Filbert, MD

## 2020-08-10 ENCOUNTER — Inpatient Hospital Stay: Payer: Medicare Other | Attending: Oncology

## 2020-08-10 DIAGNOSIS — Z452 Encounter for adjustment and management of vascular access device: Secondary | ICD-10-CM | POA: Diagnosis not present

## 2020-08-10 DIAGNOSIS — Z95828 Presence of other vascular implants and grafts: Secondary | ICD-10-CM

## 2020-08-10 DIAGNOSIS — Z85118 Personal history of other malignant neoplasm of bronchus and lung: Secondary | ICD-10-CM | POA: Diagnosis present

## 2020-08-10 MED ORDER — HEPARIN SOD (PORK) LOCK FLUSH 100 UNIT/ML IV SOLN
INTRAVENOUS | Status: AC
  Filled 2020-08-10: qty 5

## 2020-08-10 MED ORDER — HEPARIN SOD (PORK) LOCK FLUSH 100 UNIT/ML IV SOLN
500.0000 [IU] | Freq: Once | INTRAVENOUS | Status: AC
  Administered 2020-08-10: 500 [IU] via INTRAVENOUS
  Filled 2020-08-10: qty 5

## 2020-08-10 MED ORDER — SODIUM CHLORIDE 0.9% FLUSH
10.0000 mL | INTRAVENOUS | Status: DC | PRN
  Administered 2020-08-10: 10 mL via INTRAVENOUS
  Filled 2020-08-10: qty 10

## 2020-08-30 ENCOUNTER — Ambulatory Visit (INDEPENDENT_AMBULATORY_CARE_PROVIDER_SITE_OTHER): Payer: Medicare Other | Admitting: Cardiovascular Disease

## 2020-08-30 ENCOUNTER — Other Ambulatory Visit: Payer: Self-pay

## 2020-08-30 ENCOUNTER — Encounter: Payer: Self-pay | Admitting: Cardiovascular Disease

## 2020-08-30 VITALS — BP 122/82 | HR 67 | Ht 69.0 in | Wt 168.9 lb

## 2020-08-30 DIAGNOSIS — E782 Mixed hyperlipidemia: Secondary | ICD-10-CM

## 2020-08-30 DIAGNOSIS — I10 Essential (primary) hypertension: Secondary | ICD-10-CM | POA: Diagnosis not present

## 2020-08-30 DIAGNOSIS — I2510 Atherosclerotic heart disease of native coronary artery without angina pectoris: Secondary | ICD-10-CM | POA: Diagnosis not present

## 2020-08-30 LAB — HEPATIC FUNCTION PANEL
ALT: 15 IU/L (ref 0–44)
AST: 16 IU/L (ref 0–40)
Albumin: 4.4 g/dL (ref 3.8–4.8)
Alkaline Phosphatase: 134 IU/L — ABNORMAL HIGH (ref 44–121)
Bilirubin Total: 0.7 mg/dL (ref 0.0–1.2)
Bilirubin, Direct: 0.17 mg/dL (ref 0.00–0.40)
Total Protein: 6.7 g/dL (ref 6.0–8.5)

## 2020-08-30 LAB — LIPID PANEL
Chol/HDL Ratio: 4 ratio (ref 0.0–5.0)
Cholesterol, Total: 124 mg/dL (ref 100–199)
HDL: 31 mg/dL — ABNORMAL LOW (ref >39)
LDL Chol Calc (NIH): 75 mg/dL (ref 0–99)
Triglycerides: 95 mg/dL (ref 0–149)
VLDL Cholesterol Cal: 18 mg/dL (ref 5–40)

## 2020-08-30 NOTE — Patient Instructions (Signed)
Medication Instructions:  Your provider recommends that you continue on your current medications as directed. Please refer to the Current Medication list given to you today.   *If you need a refill on your cardiac medications before your next appointment, please call your pharmacy*  Lab Work: TODAY! Lipids, LFTs If you have labs (blood work) drawn today and your tests are completely normal, you will receive your results only by: Marland Kitchen MyChart Message (if you have MyChart) OR . A paper copy in the mail If you have any lab test that is abnormal or we need to change your treatment, we will call you to review the results.  Follow-Up: At [facility], you and your health needs are our priority.  As part of our continuing mission to provide you with exceptional heart care, we have created designated Provider Care Teams.  These Care Teams include your primary Cardiologist (physician) and Advanced Practice Providers (APPs -  Physician Assistants and Nurse Practitioners) who all work together to provide you with the care you need, when you need it. Your next appointment:   12 month(s) The format for your next appointment:   In Person Provider:   You may see Sherren Mocha, MD or one of the following Advanced Practice Providers on your designated Care Team:    Richardson Dopp, PA-C  Vin Udall, [place]

## 2020-08-30 NOTE — Progress Notes (Signed)
Cardiology Office Note:    Date:  08/30/2020   ID:  [name], DOB April 04, 1956, MRN [id number]  PCP:  Baxter Hire, MD   [facility] Providers Cardiologist:  Sherren Mocha, MD     Referring MD: Baxter Hire, MD   Chief Complaint  Patient presents with  . Coronary Artery Disease    History of Present Illness:    Timothy Yoder is a 65 y.o. male with a hx of coronary artery disease, initially presenting with an anterolateral STEMI in 2016 treated with a drug-eluting stent to the mid LAD.  LVEF at that time was 60%. He was noted to have moderate stenosis of the right coronary artery evaluated by pressure wire with recommendations for medical management.  The patient has non-small cell lung cancer and he is followed closely by his oncologist. He has completed chemotherapy and radiation therapy, now in surveillance program with serial CT studies. He is here alone today and feels fairly well. He has moderate shortness of breath with activity. Denies resting symptoms. No orthopnea, PND, or leg swelling. He has some fleeting chest pains but no prolonged symptoms. He has not required any NTG. Overall feels like he's doing well.   Past Medical History:  Diagnosis Date  . BPH (benign prostatic hyperplasia)   . CAD (coronary artery disease)    a. 04/2014 anterolateral STEMI/PCI: LM nl, LAD 11m(2.75x16 Promus DES, D1 50p, LCX nl, RI nl, OM1 100 small/chronic, RCA dominant, [id number](FFR 0.90->Med Rx), RPDA 50, RPLA 70, EF 60%.  . CMarland KitchenPD (chronic obstructive pulmonary disease) (HCC)    Dr FleRaul Del Former tobacco use   . Gastroesophageal reflux disease   . HTN (hypertension)   . Hyperlipidemia   . Hypokalemia   . [place]  . [place]   Dr MicSherren Mocha Stented coronary artery     Past Surgical History:  Procedure Laterality Date  . CARDIAC CATHETERIZATION    . CHOLECYSTECTOMY N/A 02/15/2016   Procedure:  LAPAROSCOPIC CHOLECYSTECTOMY WITH INTRAOPERATIVE CHOLANGIOGRAM;  Surgeon: JarLeonie GreenD;  Location: [facility] ORS;  Service: General;  Laterality: N/A;  . COLONOSCOPY    . COLONOSCOPY WITH PROPOFOL N/A 08/02/2015   Procedure: COLONOSCOPY WITH PROPOFOL;  Surgeon: RobManya SilvasD;  [facility];  Service: Endoscopy;  Laterality: N/A;  . CORONARY ANGIOPLASTY  04/2014   STENT  . ENDOBRONCHIAL ULTRASOUND N/A 03/26/2018   Procedure: ENDOBRONCHIAL ULTRASOUND;  Surgeon: KasFlora LippsD;  Location: [facility] OR[facility];  Service: Cardiopulmonary;  Laterality: N/A;  . ESOPHAGOGASTRODUODENOSCOPY (EGD) WITH PROPOFOL N/A 08/02/2015   Procedure: ESOPHAGOGASTRODUODENOSCOPY (EGD) WITH PROPOFOL;  Surgeon: RobManya SilvasD;  [facility];  Service: Endoscopy;  Laterality: N/A;  . FLEXIBLE BRONCHOSCOPY N/A 03/26/2018   Procedure: FLEXIBLE BRONCHOSCOPY;  Surgeon: KasFlora LippsD;  Location: [facility] ORS;  Service: Cardiopulmonary;  Laterality: N/A;  . KNEE ARTHROSCOPY Left   . LEFT HEART CATHETERIZATION WITH CORONARY ANGIOGRAM N/A 05/03/2014   Procedure: LEFT HEART CATHETERIZATION WITH CORONARY ANGIOGRAM;  Surgeon: MicBlane OharaD;  [facility];  Service: Cardiovascular;  Laterality: N/A;  . PERCUTANEOUS CORONARY STENT INTERVENTION (PCI-S) N/A 05/05/2014   Procedure: PERCUTANEOUS CORONARY STENT INTERVENTION (PCI-S);  Surgeon: Peter M JorMartiniqueD;  [facility];  Service: Cardiovascular;  Laterality: N/A;  . PORTACATH PLACEMENT Left 04/04/2018   Procedure: INSERTION PORT-A-CATH;  Surgeon: OakNestor LewandowskyD;  Location: [facility] ORS;  Service: General;  Laterality: Left;    Current Medications: Current Meds  Medication Sig  . acetaminophen (TYLENOL) 500 MG tablet Take 1,000 mg by mouth every 6 (six) hours as needed (for pain.).  Marland Kitchen albuterol (PROAIR HFA) 108 (90 Base) MCG/ACT inhaler Inhale 2 puffs into the lungs every 6 (six) hours as needed for wheezing or shortness of breath.   Marland Kitchen aspirin EC 81 MG tablet Take 81 mg by mouth daily.  Marland Kitchen atorvastatin (LIPITOR) 80 MG tablet Take 1 tablet (80 mg total) by mouth daily at 6 PM.  . carvedilol (COREG) 6.25 MG tablet TAKE 1 TABLET TWICE A DAY (PLEASE KEEP UPCOMING APPOINTMENT IN MAY WITH DR. Burt Knack BEFORE ANYMORE REFILLS, THANK YOU)  . finasteride (PROSCAR) 5 MG tablet Take 5 mg by mouth daily.  Marland Kitchen lidocaine-prilocaine (EMLA) cream Apply small amount to port site 1 hour prior to [facility] being accessed and cover with plastic wrap  . nitroGLYCERIN (NITROSTAT) 0.4 MG SL tablet Place 1 tablet (0.4 mg total) under the tongue every 5 (five) minutes as needed for chest pain. X 3 doses  . omeprazole (PRILOSEC) 40 MG capsule Take 1 capsule (40 mg total) by mouth 2 (two) times daily.  . ondansetron (ZOFRAN) 8 MG tablet PLEASE SEE ATTACHED FOR DETAILED DIRECTIONS  . tamsulosin (FLOMAX) 0.4 MG CAPS capsule Take 0.4 mg by mouth daily after supper.  . umeclidinium-vilanterol (ANORO ELLIPTA) 62.5-25 MCG/INH AEPB Inhale 1 puff into the lungs daily.     Allergies:   Patient has no known allergies.   Social History   Socioeconomic History  . Marital status: Married    Spouse name: Not on file  . Number of children: Not on file  . Years of education: Not on file  . Highest education level: Not on file  Occupational History  . Not on file  Tobacco Use  . Smoking status: Former Smoker    Packs/day: 1.00    Years: 35.00    Pack years: 35.00    Types: Cigarettes    Quit date: 02/14/2011    Years since quitting: 9.5  . Smokeless tobacco: Never Used  Vaping Use  . Vaping Use: Never used  Substance and Sexual Activity  . Alcohol use: No  . Drug use: No  . Sexual activity: Not on file  Other Topics Concern  . Not on file  Social History Narrative   The patient is married. He lives in [place]. He works full-time. He quit smoking in 2013.   Social Determinants of Health   Financial Resource Strain: Not on file  Food Insecurity: Not  on file  Transportation Needs: Not on file  Physical Activity: Not on file  Stress: Not on file  Social Connections: Not on file     Family History: The patient's family history includes Bone cancer in his paternal uncle; CAD (age of onset: 75) in his father; CAD (age of onset: 27) in his mother; COPD in his sister; Lung cancer in his brother. There is no history of Colon cancer or Breast cancer.  ROS:   Please see the history of present illness.    All other systems reviewed and are negative.  EKGs/Labs/Other Studies Reviewed:    The following studies were reviewed today: Echo 09/01/2019: IMPRESSIONS    1. Left ventricular ejection fraction, by estimation, is 55 to 60%. The  left ventricle has normal function. The left ventricle has no regional  wall motion abnormalities. Left ventricular diastolic parameters were  normal.  2. Right ventricular systolic  function is normal. The right ventricular  size is normal.  3. The mitral valve is normal in structure. Trivial mitral valve  regurgitation. No evidence of mitral stenosis.  4. The aortic valve is normal in structure. Aortic valve regurgitation is  trivial. No aortic stenosis is present.  5. The inferior vena cava is normal in size with greater than 50%  respiratory variability, suggesting right atrial pressure of 3 mmHg.  EKG:  EKG is not ordered today.    Recent Labs: 10/20/2019: TSH 1.452 05/21/2020: BUN 9; Creatinine, Ser 1.00; Hemoglobin 14.6; Platelets 165; Potassium 3.9; Sodium 137 08/30/2020: ALT 15  Recent Lipid Panel    Component Value Date/Time   CHOL 124 08/30/2020 0954   TRIG 95 08/30/2020 0954   HDL 31 (L) 08/30/2020 0954   CHOLHDL 4.0 08/30/2020 0954   CHOLHDL 3 07/31/2014 0736   VLDL 15.2 07/31/2014 0736   LDLCALC 75 08/30/2020 0954     Risk Assessment/Calculations:       Physical Exam:    VS:  BP 122/82   Pulse 67   Ht _0  (1.753 m)   Wt 168 lb 14.4 oz (76.6 kg)   SpO2 98%   BMI 24.94  kg/m     Wt Readings from Last 3 Encounters:  08/30/20 168 lb 14.4 oz (76.6 kg)  07/12/20 168 lb (76.2 kg)  07/05/20 166 lb (75.3 kg)     GEN:  Well nourished, well developed in no acute distress HEENT: Normal NECK: No JVD; No carotid bruits LYMPHATICS: No lymphadenopathy CARDIAC: RRR, no murmurs, rubs, gallops RESPIRATORY:  Clear to auscultation without rales, wheezing or rhonchi  ABDOMEN: Soft, non-tender, non-distended MUSCULOSKELETAL:  No edema; No deformity  SKIN: Warm and dry NEUROLOGIC:  Alert and oriented x 3 PSYCHIATRIC:  Normal affect   ASSESSMENT:    1. Coronary artery disease involving native coronary artery of native heart without angina pectoris   2. Essential hypertension   3. Mixed hyperlipidemia    PLAN:    In order of problems listed above:  1. Patient is stable with no symptoms of angina.  He continues on aspirin, high intensity statin drug, and carvedilol.  He has fleeting chest pains that are noncardiac in nature.  Overall he is doing quite well.  His echocardiogram from last year is reviewed as above and it demonstrates normal LV function. 2. Blood pressure well controlled on carvedilol.  Continue current therapy. 3. Lipids from last year reviewed with a cholesterol of 90, HDL 31, LDL 44.  We will repeat his lipid studies today as he is fasting.  We will also check LFTs.  Overall the patient appears to be stable from a cardiovascular perspective.  He will continue his current medications and I will see him back in 1 year for follow-up evaluation.  Medication Adjustments/Labs and Tests Ordered: Current medicines are reviewed at length with the patient today.  Concerns regarding medicines are outlined above.  Orders Placed This Encounter  Procedures  . Lipid panel  . Hepatic function panel   No orders of the defined types were placed in this encounter.   Patient Instructions  Medication Instructions:  Your provider recommends that you continue on  your current medications as directed. Please refer to the Current Medication list given to you today.   *If you need a refill on your cardiac medications before your next appointment, please call your pharmacy*  Lab Work: TODAY! Lipids, LFTs If you have labs (blood work) drawn today and your  tests are completely normal, you will receive your results only by: Marland Kitchen MyChart Message (if you have MyChart) OR . A paper copy in the mail If you have any lab test that is abnormal or we need to change your treatment, we will call you to review the results.  Follow-Up: At [facility], you and your health needs are our priority.  As part of our continuing mission to provide you with exceptional heart care, we have created designated Provider Care Teams.  These Care Teams include your primary Cardiologist (physician) and Advanced Practice Providers (APPs -  Physician Assistants and Nurse Practitioners) who all work together to provide you with the care you need, when you need it. Your next appointment:   12 month(s) The format for your next appointment:   In Person Provider:   You may see Sherren Mocha, MD or one of the following Advanced Practice Providers on your designated Care Team:    Richardson Dopp, PA-C  Robbie Lis, [place]      Signed, Sherren Mocha, MD  08/30/2020 5:19 PM    [place]

## 2020-09-07 ENCOUNTER — Telehealth: Payer: Self-pay | Admitting: Oncology

## 2020-09-07 ENCOUNTER — Other Ambulatory Visit: Payer: Self-pay

## 2020-09-07 DIAGNOSIS — C3431 Malignant neoplasm of lower lobe, right bronchus or lung: Secondary | ICD-10-CM

## 2020-09-07 NOTE — Telephone Encounter (Signed)
Per Dr. Tasia Catchings, ok to switch order to CT chest w/o cont. New order entered.   Since CT will be r/s, Please move lab/MD to a few days after CT (for results) and notify pt of new appts.   Thanks

## 2020-09-14 ENCOUNTER — Inpatient Hospital Stay: Payer: Medicare Other

## 2020-09-14 ENCOUNTER — Ambulatory Visit: Payer: Medicare Other

## 2020-09-16 ENCOUNTER — Ambulatory Visit: Payer: Medicare Other | Admitting: Oncology

## 2020-09-21 ENCOUNTER — Inpatient Hospital Stay: Payer: Medicare Other | Admitting: Oncology

## 2020-09-22 ENCOUNTER — Other Ambulatory Visit: Payer: Self-pay

## 2020-09-22 ENCOUNTER — Ambulatory Visit
Admission: RE | Admit: 2020-09-22 | Discharge: 2020-09-22 | Disposition: A | Discharge status: Home / Self Care | Payer: Medicare Other | Source: Ambulatory Visit | Attending: Oncology | Admitting: Oncology

## 2020-09-22 DIAGNOSIS — C3431 Malignant neoplasm of lower lobe, right bronchus or lung: Secondary | ICD-10-CM | POA: Insufficient documentation

## 2020-09-23 ENCOUNTER — Inpatient Hospital Stay (HOSPITAL_BASED_OUTPATIENT_CLINIC_OR_DEPARTMENT_OTHER): Payer: Medicare Other | Admitting: Oncology

## 2020-09-23 ENCOUNTER — Inpatient Hospital Stay: Payer: Medicare Other | Attending: Oncology

## 2020-09-23 ENCOUNTER — Encounter: Payer: Self-pay | Admitting: Oncology

## 2020-09-23 VITALS — BP 126/88 | HR 73 | Temp 96.9°F | Resp 18 | Wt 168.7 lb

## 2020-09-23 DIAGNOSIS — R748 Abnormal levels of other serum enzymes: Secondary | ICD-10-CM | POA: Diagnosis not present

## 2020-09-23 DIAGNOSIS — J449 Chronic obstructive pulmonary disease, unspecified: Secondary | ICD-10-CM

## 2020-09-23 DIAGNOSIS — Z95828 Presence of other vascular implants and grafts: Secondary | ICD-10-CM

## 2020-09-23 DIAGNOSIS — C3431 Malignant neoplasm of lower lobe, right bronchus or lung: Secondary | ICD-10-CM | POA: Insufficient documentation

## 2020-09-23 LAB — CBC WITH DIFFERENTIAL/PLATELET
Abs Immature Granulocytes: 0.04 10*3/uL (ref 0.00–0.07)
Basophils Absolute: 0 10*3/uL (ref 0.0–0.1)
Basophils Relative: 1 %
Eosinophils Absolute: 0.1 10*3/uL (ref 0.0–0.5)
Eosinophils Relative: 2 %
HCT: 43.3 % (ref 39.0–52.0)
Hemoglobin: 14.7 g/dL (ref 13.0–17.0)
Immature Granulocytes: 1 %
Lymphocytes Relative: 23 %
Lymphs Abs: 1.2 10*3/uL (ref 0.7–4.0)
MCH: 31.4 pg (ref 26.0–34.0)
MCHC: 33.9 g/dL (ref 30.0–36.0)
MCV: 92.5 fL (ref 80.0–100.0)
Monocytes Absolute: 0.6 10*3/uL (ref 0.1–1.0)
Monocytes Relative: 10 %
Neutro Abs: 3.5 10*3/uL (ref 1.7–7.7)
Neutrophils Relative %: 63 %
Platelets: 182 10*3/uL (ref 150–400)
RBC: 4.68 MIL/uL (ref 4.22–5.81)
RDW: 13.4 % (ref 11.5–15.5)
WBC: 5.4 10*3/uL (ref 4.0–10.5)
nRBC: 0 % (ref 0.0–0.2)

## 2020-09-23 LAB — COMPREHENSIVE METABOLIC PANEL
ALT: 19 U/L (ref 0–44)
AST: 18 U/L (ref 15–41)
Albumin: 3.9 g/dL (ref 3.5–5.0)
Alkaline Phosphatase: 99 U/L (ref 38–126)
Anion gap: 5 (ref 5–15)
BUN: 10 mg/dL (ref 8–23)
CO2: 25 mmol/L (ref 22–32)
Calcium: 8.7 mg/dL — ABNORMAL LOW (ref 8.9–10.3)
Chloride: 106 mmol/L (ref 98–111)
Creatinine, Ser: 1.07 mg/dL (ref 0.61–1.24)
GFR, Estimated: >60 mL/min (ref >60)
Glucose, Bld: 109 mg/dL — ABNORMAL HIGH (ref 70–99)
Potassium: 3.6 mmol/L (ref 3.5–5.1)
Sodium: 136 mmol/L (ref 135–145)
Total Bilirubin: 1 mg/dL (ref 0.3–1.2)
Total Protein: 6.5 g/dL (ref 6.5–8.1)

## 2020-09-23 NOTE — Progress Notes (Signed)
Hematology/Oncology follow up note [facility] Telephone:(336) [phone] Fax:(336) [phone]   Patient Care Team: Baxter Hire, MD as PCP - General (Internal Medicine) Sherren Mocha, MD as PCP - Cardiology (Cardiology) Telford Nab, RN as Registered Nurse  REFERRING PROVIDER: Dr.Fleming REASON FOR VISIT:  Follow-up for management of stage III non-small cell lung cancer   HISTORY OF PRESENTING ILLNESS:  Timothy Yoder is a  65 y.o.  male with PMH listed below who was referred to me for evaluation of lung mass.  Patient is a former smoker quit smoking 7 years ago.  40-pack-year. Patient chest lung cancer screening on 03/11/2018. CT scan showed 2.4 cm right lower lobe nodule with a new necrotic.  Subcarinal lymph node. PET scan showed hypermetabolic right lower pulmonary nodule, consistent with primary bronchogenic carcinoma.  Fluid density subcarinal structure demonstrate no significant hypermetabolism.  Although this could represent an incidental benign lesion such as a cyst given the interval development since 02/24/2017, necrotic adenopathy cannot be excluded and tissue sampling should be considered.  # underwent biopsy via bronchoscopy.  Subcarinal lymph node biopsy positive for non-small cell lung cancer, favoring poorly differentiated adenocarcinoma.  Stage IIIA T1c N2M0 #Mediport was placed by Dr. Genevive Bi  # #Elevated alkaline phosphatase. Patient has been evaluated by gastroenterology.  Liver biopsy showed mild sinusoidal dilation and congestion.  Minimal changes suggestive of hepatic process. Iron stain demonstrate minimal focal hepatocellular iron. I discussed with Dr.Wohl via secure chat about patient's pathology result.  No restriction of proceeding with immunotherapy.  Continue watch for the labs.   # Cancer Treatment 04/26/2018 -06/11/2018 Concurrent Chemotherapy [weekly Carbo and Taxol] with RT.  07/10/2018 maintenance Durvalumab. Finished immunotherapy  in May 2021 INTERVAL HISTORY Timothy Yoder is a 65 y.o. male who has above history reviewed by me today presents for follow up visit for management of stage III non-small cell lung cancer.   Patient is clinically doing very well.  He denies any unintentional weight loss, cough  Review of Systems  Constitutional: Negative for appetite change, chills, diaphoresis, fatigue, fever and unexpected weight change.  HENT:   Negative for hearing loss, lump/mass, nosebleeds, sore throat and voice change.   Eyes: Negative for eye problems and icterus.  Respiratory: Negative for chest tightness, cough, hemoptysis, shortness of breath and wheezing.   Cardiovascular: Negative for chest pain and leg swelling.  Gastrointestinal: Negative for abdominal distention, abdominal pain, blood in stool, diarrhea, nausea and rectal pain.  Endocrine: Negative for hot flashes.  Genitourinary: Negative for bladder incontinence, difficulty urinating, dysuria, frequency, hematuria and nocturia.   Musculoskeletal: Negative for arthralgias, back pain, flank pain, gait problem and myalgias.  Skin: Negative for itching and rash.  Neurological: Negative for dizziness, gait problem, headaches, light-headedness, numbness and seizures.  Hematological: Negative for adenopathy. Does not bruise/bleed easily.  Psychiatric/Behavioral: Negative for confusion and decreased concentration. The patient is not nervous/anxious.    MEDICAL HISTORY:  Past Medical History:  Diagnosis Date  . BPH (benign prostatic hyperplasia)   . CAD (coronary artery disease)    a. 04/2014 anterolateral STEMI/PCI: LM nl, LAD 48m(2.75x16 Promus DES, D1 50p, LCX nl, RI nl, OM1 100 small/chronic, RCA dominant, 20-386m7068m(FFR 0.90->Med Rx), RPDA 50, RPLA 70, EF 60%.  . CMarland KitchenPD (chronic obstructive pulmonary disease) (HCC)    Dr FleRaul Del Former tobacco use   . Gastroesophageal reflux disease   . HTN (hypertension)   . Hyperlipidemia   . Hypokalemia   .  Malignant  neoplasm of lung ([place]) 04/01/2018  . Myocardial infarction ([place]) 04/2014   Dr Sherren Mocha  . Stented coronary artery     SURGICAL HISTORY: Past Surgical History:  Procedure Laterality Date  . CARDIAC CATHETERIZATION    . CHOLECYSTECTOMY N/A 02/15/2016   Procedure: LAPAROSCOPIC CHOLECYSTECTOMY WITH INTRAOPERATIVE CHOLANGIOGRAM;  Surgeon: Leonie Green, MD;  Location: [facility] ORS;  Service: General;  Laterality: N/A;  . COLONOSCOPY    . COLONOSCOPY WITH PROPOFOL N/A 08/02/2015   Procedure: COLONOSCOPY WITH PROPOFOL;  Surgeon: Manya Silvas, MD;  Location: [facility] ENDOSCOPY;  Service: Endoscopy;  Laterality: N/A;  . CORONARY ANGIOPLASTY  04/2014   STENT  . ENDOBRONCHIAL ULTRASOUND N/A 03/26/2018   Procedure: ENDOBRONCHIAL ULTRASOUND;  Surgeon: Flora Lipps, MD;  Location: [facility] ORS;  Service: Cardiopulmonary;  Laterality: N/A;  . ESOPHAGOGASTRODUODENOSCOPY (EGD) WITH PROPOFOL N/A 08/02/2015   Procedure: ESOPHAGOGASTRODUODENOSCOPY (EGD) WITH PROPOFOL;  Surgeon: Manya Silvas, MD;  Location: [facility] ENDOSCOPY;  Service: Endoscopy;  Laterality: N/A;  . FLEXIBLE BRONCHOSCOPY N/A 03/26/2018   Procedure: FLEXIBLE BRONCHOSCOPY;  Surgeon: Flora Lipps, MD;  Location: [facility] ORS;  Service: Cardiopulmonary;  Laterality: N/A;  . KNEE ARTHROSCOPY Left   . LEFT HEART CATHETERIZATION WITH CORONARY ANGIOGRAM N/A 05/03/2014   Procedure: LEFT HEART CATHETERIZATION WITH CORONARY ANGIOGRAM;  Surgeon: Blane Ohara, MD;  Location: [facility] CATH LAB;  Service: Cardiovascular;  Laterality: N/A;  . PERCUTANEOUS CORONARY STENT INTERVENTION (PCI-S) N/A 05/05/2014   Procedure: PERCUTANEOUS CORONARY STENT INTERVENTION (PCI-S);  Surgeon: Peter M Martinique, MD;  Location: [facility] CATH LAB;  Service: Cardiovascular;  Laterality: N/A;  . PORTACATH PLACEMENT Left 04/04/2018   Procedure: INSERTION PORT-A-CATH;  Surgeon: [name], MD;  Location: [facility] ORS;  Service: General;  Laterality: Left;    SOCIAL HISTORY: Social  History   Socioeconomic History  . Marital status: Married    Spouse name: Not on file  . Number of children: Not on file  . Years of education: Not on file  . Highest education level: Not on file  Occupational History  . Not on file  Tobacco Use  . Smoking status: Former Smoker    Packs/day: 1.00    Years: 35.00    Pack years: 35.00    Types: Cigarettes    Quit date: 02/14/2011    Years since quitting: 9.6  . Smokeless tobacco: Never Used  Vaping Use  . Vaping Use: Never used  Substance and Sexual Activity  . Alcohol use: No  . Drug use: No  . Sexual activity: Not on file  Other Topics Concern  . Not on file  Social History Narrative   The patient is married. He lives in [place]. He works full-time. He quit smoking in 2013.   Social Determinants of Health   Financial Resource Strain: Not on file  Food Insecurity: Not on file  Transportation Needs: Not on file  Physical Activity: Not on file  Stress: Not on file  Social Connections: Not on file  Intimate Partner Violence: Not on file    FAMILY HISTORY: Family History  Problem Relation Age of Onset  . CAD Mother 19       Died of an MI  . CAD Father 46       Died of a massive MI  . COPD Sister   . Lung cancer Brother   . Bone cancer Paternal Uncle   . Colon cancer Neg Hx   . Breast cancer Neg Hx     ALLERGIES:  has No Known Allergies.  MEDICATIONS:  Current Outpatient Medications  Medication Sig Dispense Refill  . acetaminophen (TYLENOL) 500 MG tablet Take 1,000 mg by mouth every 6 (six) hours as needed (for pain.).    Marland Kitchen albuterol (PROAIR HFA) 108 (90 Base) MCG/ACT inhaler Inhale 2 puffs into the lungs every 6 (six) hours as needed for wheezing or shortness of breath. 54 g 4  . aspirin EC 81 MG tablet Take 81 mg by mouth daily.    Marland Kitchen atorvastatin (LIPITOR) 80 MG tablet Take 1 tablet (80 mg total) by mouth daily at 6 PM. 90 tablet 3  . carvedilol (COREG) 6.25 MG tablet TAKE 1 TABLET TWICE A DAY (PLEASE  KEEP UPCOMING APPOINTMENT IN MAY WITH DR. Burt Knack BEFORE ANYMORE REFILLS, THANK YOU) 180 tablet 3  . famotidine (PEPCID) 40 MG tablet Take by mouth.    . finasteride (PROSCAR) 5 MG tablet Take 5 mg by mouth daily.    Marland Kitchen lidocaine-prilocaine (EMLA) cream Apply small amount to port site 1 hour prior to [facility] being accessed and cover with plastic wrap 30 g 1  . Molnupiravir 200 MG CAPS Take by mouth.    Marland Kitchen omeprazole (PRILOSEC) 40 MG capsule Take 1 capsule (40 mg total) by mouth 2 (two) times daily. 180 capsule 1  . tamsulosin (FLOMAX) 0.4 MG CAPS capsule Take 0.4 mg by mouth daily after supper.    . umeclidinium-vilanterol (ANORO ELLIPTA) 62.5-25 MCG/INH AEPB Inhale 1 puff into the lungs daily. 3 each 4  . nitroGLYCERIN (NITROSTAT) 0.4 MG SL tablet Place 1 tablet (0.4 mg total) under the tongue every 5 (five) minutes as needed for chest pain. X 3 doses (Patient not taking: Reported on 09/23/2020) 25 tablet 3  . ondansetron (ZOFRAN) 8 MG tablet PLEASE SEE ATTACHED FOR DETAILED DIRECTIONS (Patient not taking: Reported on 09/23/2020)     No current facility-administered medications for this visit.     PHYSICAL EXAMINATION: ECOG PERFORMANCE STATUS: 1 - Symptomatic but completely ambulatory Vitals:   09/23/20 1021  BP: 126/88  Pulse: 73  Resp: 18  Temp: (!) 96.9 F (36.1 C)  SpO2: 98%   Filed Weights   09/23/20 1021  Weight: 168 lb 10.4 oz (76.5 kg)    Physical Exam Constitutional:      General: He is not in acute distress. HENT:     Head: Normocephalic and atraumatic.  Eyes:     General: No scleral icterus. Cardiovascular:     Rate and Rhythm: Normal rate and regular rhythm.     Heart sounds: Normal heart sounds.  Pulmonary:     Effort: Pulmonary effort is normal. No respiratory distress.     Breath sounds: No wheezing.  Abdominal:     General: Bowel sounds are normal. There is no distension.     Palpations: Abdomen is soft.  Musculoskeletal:        General: No deformity. Normal  range of motion.     Cervical back: Normal range of motion and neck supple.  Skin:    General: Skin is warm and dry.     Findings: No erythema or rash.  Neurological:     Mental Status: He is alert and oriented to person, place, and time. Mental status is at baseline.     Cranial Nerves: No cranial nerve deficit.     Coordination: Coordination normal.  Psychiatric:        Mood and Affect: Mood normal.      LABORATORY DATA:  I have reviewed the data as listed Lab  Results  Component Value Date   WBC 5.4 09/23/2020   HGB 14.7 09/23/2020   HCT 43.3 09/23/2020   MCV 92.5 09/23/2020   PLT 182 09/23/2020   Recent Labs    10/20/19 0921 02/11/20 0939 04/27/20 2107 05/21/20 0929 08/30/20 0954 09/23/20 1010  NA 139   < > 139 137  --  136  K 4.4   < > 3.8 3.9  --  3.6  CL 103   < > 105 102  --  106  CO2 29   < > 25 28  --  25  GLUCOSE 122*   < > 121* 108*  --  109*  BUN 11   < > 12 9  --  10  CREATININE 1.15   < > 1.27* 1.00  --  1.07  CALCIUM 8.9   < > 9.2 8.6*  --  8.7*  GFRNONAA >60   < > >60 >60  --  >60  GFRAA >60  --   --   --   --   --   PROT 6.9   < >  --  6.7 6.7 6.5  ALBUMIN 4.0   < >  --  3.7 4.4 3.9  AST 17   < >  --  _0 ALT 22   < >  --  _1 ALKPHOS 126   < >  --  104 134* 99  BILITOT 0.7   < >  --  0.8 0.7 1.0  BILIDIR  --   --   --   --  0.17  --    < > = values in this interval not displayed.   Iron/TIBC/Ferritin/ %Sat    Component Value Date/Time   IRON 41 (L) 11/21/2018 0856   TIBC 260 11/21/2018 0856   FERRITIN 150 11/21/2018 0856   IRONPCTSAT 16 (L) 11/21/2018 0856    RADIOGRAPHIC STUDIES: I have personally reviewed the radiological images as listed and agreed with the findings in the report.  CT Chest Wo Contrast  Result Date: 09/22/2020 CLINICAL DATA:  Routine follow-up for right lung cancer. No current complaints. Immuno therapy completed 1 year ago. EXAM: CT CHEST WITHOUT CONTRAST TECHNIQUE: Multidetector CT imaging of the chest  was performed following the standard protocol without IV contrast. COMPARISON:  Chest CT 05/19/2020 and 02/11/2020. FINDINGS: Cardiovascular: Left subclavian Port-A-Cath tip unchanged at the mid SVC level. Moderate diffuse atherosclerosis of the aorta, great vessels and coronary arteries. No acute vascular findings on noncontrast imaging. The heart size is normal. There is no pericardial effusion. Mediastinum/Nodes: There are no enlarged mediastinal, hilar or axillary lymph nodes. The thyroid gland, trachea and esophagus demonstrate no significant findings. Lungs/Pleura: No pleural effusion or pneumothorax. Moderate centrilobular and paraseptal emphysema with stable biapical and lingular scarring. Treatment changes medially in the right lower lobe are stable with volume loss and architectural distortion. No evidence of local recurrence or suspicious pulmonary nodule. Small perifissural nodule along the posterior aspect of the minor fissure (image 94/3) and small left lower lobe nodule (image 110/3) are unchanged. Scattered calcified granulomas are present bilaterally. Upper abdomen: The visualized upper abdomen appears stable without suspicious findings. Previous cholecystectomy. Musculoskeletal/Chest wall: There is no chest wall mass or suspicious osseous finding. IMPRESSION: 1. Stable treatment changes medially in the right lower lobe. No evidence of local recurrence or metastatic disease. 2. Stable small pulmonary nodules bilaterally. No new or enlarging nodules. 3. Aortic Atherosclerosis (ICD10-I70.0) and Emphysema (ICD10-J43.9). Electronically  Signed   By: Richardean Sale M.D.   On: 09/22/2020 20:17     ASSESSMENT & PLAN:  1. Malignant neoplasm of lower lobe of right lung ([place])   2. Port-A-Cath in place   3. Chronic obstructive pulmonary disease, unspecified COPD type ([place])   Cancer Staging Malignant neoplasm of lower lobe of right lung [facility]) Staging form: Lung, AJCC 8th Edition - Clinical stage from  05/13/2018: Stage IIIA (cT1c, cN2, cM0) - Signed by Earlie Server, MD on 05/13/2018  #Stage IIIA lung adenocarcinoma Labs are reviewed and discussed with patient 09/22/2020, CT images were independently reviewed by me and discussed with patient. No signs of recurrent or metastatic disease. Continue surveillance CT every 6 months up to year 5.  Port-A-Cath in place, continue port flush every 8 weeks.  He may have Mediport taken out after 2 years. COPD, continue albuterol inhaler  Follow up in 3 months.  Earlie Server, MD, PhD 09/23/20

## 2020-10-05 ENCOUNTER — Other Ambulatory Visit: Payer: Self-pay

## 2020-10-05 ENCOUNTER — Inpatient Hospital Stay: Payer: Medicare Other

## 2020-10-05 DIAGNOSIS — C3431 Malignant neoplasm of lower lobe, right bronchus or lung: Secondary | ICD-10-CM | POA: Diagnosis not present

## 2020-10-05 DIAGNOSIS — Z95828 Presence of other vascular implants and grafts: Secondary | ICD-10-CM

## 2020-10-05 MED ORDER — SODIUM CHLORIDE 0.9% FLUSH
10.0000 mL | INTRAVENOUS | Status: DC | PRN
  Administered 2020-10-05: 10 mL via INTRAVENOUS
  Filled 2020-10-05: qty 10

## 2020-10-05 MED ORDER — HEPARIN SOD (PORK) LOCK FLUSH 100 UNIT/ML IV SOLN
500.0000 [IU] | Freq: Once | INTRAVENOUS | Status: AC
Start: 2020-10-05 — End: 2020-10-05
  Administered 2020-10-05: 500 [IU] via INTRAVENOUS
  Filled 2020-10-05: qty 5

## 2020-10-14 ENCOUNTER — Telehealth: Payer: Self-pay | Admitting: Oncology

## 2020-10-14 ENCOUNTER — Encounter: Payer: Self-pay | Admitting: Oncology

## 2020-11-02 ENCOUNTER — Other Ambulatory Visit: Payer: Self-pay | Admitting: Cardiovascular Disease

## 2020-11-15 ENCOUNTER — Other Ambulatory Visit: Payer: Self-pay

## 2020-11-15 ENCOUNTER — Other Ambulatory Visit: Payer: Self-pay | Admitting: *Deleted

## 2020-11-15 MED ORDER — NITROGLYCERIN 0.4 MG SL SUBL: 0.4000 mg | SUBLINGUAL_TABLET | SUBLINGUAL | 3 refills | Status: DC | PRN

## 2020-11-26 ENCOUNTER — Encounter: Payer: Self-pay | Admitting: *Deleted

## 2020-11-29 ENCOUNTER — Ambulatory Visit: Payer: Medicare Other | Admitting: Anesthesiology

## 2020-11-29 ENCOUNTER — Ambulatory Visit
Admission: RE | Admit: 2020-11-29 | Discharge: 2020-11-29 | Disposition: A | Discharge status: Home / Self Care | Payer: Medicare Other | Attending: Gastroenterology | Admitting: Gastroenterology

## 2020-11-29 ENCOUNTER — Encounter: Payer: Self-pay | Admitting: Anesthesiology

## 2020-11-29 ENCOUNTER — Encounter
Admission: RE | Disposition: A | Discharge status: Home / Self Care | Payer: Self-pay | Source: Home / Self Care | Attending: Gastroenterology

## 2020-11-29 ENCOUNTER — Other Ambulatory Visit: Payer: Self-pay

## 2020-11-29 DIAGNOSIS — Z87891 Personal history of nicotine dependence: Secondary | ICD-10-CM | POA: Insufficient documentation

## 2020-11-29 DIAGNOSIS — D123 Benign neoplasm of transverse colon: Secondary | ICD-10-CM | POA: Insufficient documentation

## 2020-11-29 DIAGNOSIS — R1310 Dysphagia, unspecified: Secondary | ICD-10-CM | POA: Diagnosis present

## 2020-11-29 DIAGNOSIS — K2100 Gastro-esophageal reflux disease with esophagitis, without bleeding: Secondary | ICD-10-CM | POA: Insufficient documentation

## 2020-11-29 DIAGNOSIS — Z9049 Acquired absence of other specified parts of digestive tract: Secondary | ICD-10-CM | POA: Diagnosis not present

## 2020-11-29 DIAGNOSIS — Z79899 Other long term (current) drug therapy: Secondary | ICD-10-CM | POA: Insufficient documentation

## 2020-11-29 DIAGNOSIS — R194 Change in bowel habit: Secondary | ICD-10-CM | POA: Diagnosis not present

## 2020-11-29 DIAGNOSIS — K2950 Unspecified chronic gastritis without bleeding: Secondary | ICD-10-CM | POA: Diagnosis not present

## 2020-11-29 DIAGNOSIS — Z955 Presence of coronary angioplasty implant and graft: Secondary | ICD-10-CM | POA: Diagnosis not present

## 2020-11-29 DIAGNOSIS — K5730 Diverticulosis of large intestine without perforation or abscess without bleeding: Secondary | ICD-10-CM | POA: Insufficient documentation

## 2020-11-29 DIAGNOSIS — E785 Hyperlipidemia, unspecified: Secondary | ICD-10-CM | POA: Insufficient documentation

## 2020-11-29 DIAGNOSIS — K449 Diaphragmatic hernia without obstruction or gangrene: Secondary | ICD-10-CM | POA: Diagnosis not present

## 2020-11-29 DIAGNOSIS — Z7982 Long term (current) use of aspirin: Secondary | ICD-10-CM | POA: Insufficient documentation

## 2020-11-29 DIAGNOSIS — D124 Benign neoplasm of descending colon: Secondary | ICD-10-CM | POA: Diagnosis not present

## 2020-11-29 DIAGNOSIS — K222 Esophageal obstruction: Secondary | ICD-10-CM | POA: Insufficient documentation

## 2020-11-29 DIAGNOSIS — Z85118 Personal history of other malignant neoplasm of bronchus and lung: Secondary | ICD-10-CM | POA: Insufficient documentation

## 2020-11-29 HISTORY — PX: ESOPHAGOGASTRODUODENOSCOPY (EGD) WITH PROPOFOL: SHX5813

## 2020-11-29 HISTORY — PX: COLONOSCOPY WITH PROPOFOL: SHX5780

## 2020-11-29 SURGERY — COLONOSCOPY WITH PROPOFOL
Anesthesia: General

## 2020-11-29 MED ORDER — LIDOCAINE HCL (PF) 2 % IJ SOLN
INTRAMUSCULAR | Status: AC
  Filled 2020-11-29: qty 5

## 2020-11-29 MED ORDER — SODIUM CHLORIDE 0.9 % IV SOLN: INTRAVENOUS | Status: DC

## 2020-11-29 MED ORDER — PROPOFOL 500 MG/50ML IV EMUL
INTRAVENOUS | Status: AC
  Filled 2020-11-29: qty 50

## 2020-11-29 MED ORDER — LIDOCAINE HCL (CARDIAC) PF 100 MG/5ML IV SOSY
PREFILLED_SYRINGE | INTRAVENOUS | Status: DC | PRN
  Administered 2020-11-29: 50 mg via INTRAVENOUS

## 2020-11-29 MED ORDER — PROPOFOL 500 MG/50ML IV EMUL
INTRAVENOUS | Status: DC | PRN
  Administered 2020-11-29: 150 ug/kg/min via INTRAVENOUS

## 2020-11-29 NOTE — Interval H&P Note (Signed)
History and Physical Interval Note:  11/29/2020 10:48 AM  Timothy Yoder  has presented today for surgery, with the diagnosis of SCREENING  DYSPHAGIA  GERD.  The various methods of treatment have been discussed with the patient and family. After consideration of risks, benefits and other options for treatment, the patient has consented to  Procedure(s): COLONOSCOPY WITH PROPOFOL (N/A) ESOPHAGOGASTRODUODENOSCOPY (EGD) WITH PROPOFOL (N/A) as a surgical intervention.  The patient's history has been reviewed, patient examined, no change in status, stable for surgery.  I have reviewed the patient's chart and labs.  Questions were answered to the patient's satisfaction.     Lesly Rubenstein  Ok to proceed with EGD/Colonoscopy

## 2020-11-29 NOTE — H&P (Signed)
Outpatient short stay form Pre-procedure 11/29/2020 10:40 AM Raylene Miyamoto MD, MPH  Primary Physician:  Dr. Edwina Barth  Reason for visit:  GERD/Dysphagia and change  History of present illness:   65 y/o gentleman with history of hyperlipidemia here for EGD for GERD/dysphagia and colonoscopy for change in bowel habits. No blood thinners. No family history of GI malignancies. History of cholecystectomy.    Current Facility-Administered Medications:    0.9 %  sodium chloride infusion, , Intravenous, Continuous, Jadrian Bulman, Hilton Cork, MD, Last Rate: 20 mL/hr at 11/29/20 1039, Continued from Pre-op at 11/29/20 1039  Medications Prior to Admission  Medication Sig Dispense Refill Last Dose   acetaminophen (TYLENOL) 500 MG tablet Take 1,000 mg by mouth every 6 (six) hours as needed (for pain.).   Past Month   albuterol (PROAIR HFA) 108 (90 Base) MCG/ACT inhaler Inhale 2 puffs into the lungs every 6 (six) hours as needed for wheezing or shortness of breath. 54 g 4 11/29/2020   aspirin EC 81 MG tablet Take 81 mg by mouth daily.   Past Week   atorvastatin (LIPITOR) 80 MG tablet TAKE 1 TABLET DAILY AT 6 P.M. 90 tablet 3 Past Week   carvedilol (COREG) 6.25 MG tablet TAKE 1 TABLET TWICE A DAY (PLEASE KEEP UPCOMING APPOINTMENT IN MAY WITH DR. Burt Knack BEFORE ANYMORE REFILLS, THANK YOU) 180 tablet 3 11/28/2020   famotidine (PEPCID) 40 MG tablet Take by mouth.   11/28/2020   finasteride (PROSCAR) 5 MG tablet Take 5 mg by mouth daily.   Past Week   omeprazole (PRILOSEC) 40 MG capsule Take 1 capsule (40 mg total) by mouth 2 (two) times daily. 180 capsule 1 11/28/2020   tamsulosin (FLOMAX) 0.4 MG CAPS capsule Take 0.4 mg by mouth daily after supper.   11/28/2020   umeclidinium-vilanterol (ANORO ELLIPTA) 62.5-25 MCG/INH AEPB Inhale 1 puff into the lungs daily. 3 each 4 11/29/2020   lidocaine-prilocaine (EMLA) cream Apply small amount to port site 1 hour prior to [facility] being accessed and cover with plastic wrap 30 g 1     Molnupiravir 200 MG CAPS Take by mouth.      nitroGLYCERIN (NITROSTAT) 0.4 MG SL tablet Place 1 tablet (0.4 mg total) under the tongue every 5 (five) minutes as needed for chest pain. X 3 doses 25 tablet 3    ondansetron (ZOFRAN) 8 MG tablet         No Known Allergies   Past Medical History:  Diagnosis Date   BPH (benign prostatic hyperplasia)    CAD (coronary artery disease)    a. 04/2014 anterolateral STEMI/PCI: LM nl, LAD 70m (2.75x16 Promus DES, D1 50p, LCX nl, RI nl, OM1 100 small/chronic, RCA dominant, 20-70m, 28m/d (FFR 0.90->Med Rx), RPDA 50, RPLA 70, EF 60%.   COPD (chronic obstructive pulmonary disease) (HCC)    Dr Raul Del   Former tobacco use    Gastroesophageal reflux disease    HTN (hypertension)    Hyperlipidemia    Hypokalemia    Malignant neoplasm of lung ([place]) 04/01/2018   Myocardial infarction ([place]) 04/2014   Dr Sherren Mocha   Stented coronary artery     Review of systems:  Otherwise negative.    Physical Exam  Gen: Alert, oriented. Appears stated age.  HEENT:  PERRLA. Lungs: No respiratory distress CV: RRR Abd: soft, benign, no masses Ext: No edema    Planned procedures: Proceed with EGD/colonoscopy. The patient understands the nature of the planned procedure, indications, risks, alternatives and potential complications including but  not limited to bleeding, infection, perforation, damage to internal organs and possible oversedation/side effects from anesthesia. The patient agrees and gives consent to proceed.  Please refer to procedure notes for findings, recommendations and patient disposition/instructions.     Raylene Miyamoto MD, MPH Gastroenterology 11/29/2020  10:40 AM

## 2020-11-29 NOTE — Op Note (Signed)
[facility] Gastroenterology Patient Name: Timothy Yoder Procedure Date: 11/29/2020 10:37 AM MRN: [id number] Account #: [id number] Date of Birth: 1955-05-25 Admit Type: Outpatient Age: 65 Room: [facility] ENDO ROOM 3 Gender: Male Note Status: Finalized Procedure:             Upper GI endoscopy Indications:           Dysphagia, Gastro-esophageal reflux disease Providers:             Andrey Farmer MD, MD Medicines:             Monitored Anesthesia Care Complications:         No immediate complications. Estimated blood loss:                         Minimal. Procedure:             Pre-Anesthesia Assessment:                        - Prior to the procedure, a History and Physical was                         performed, and patient medications and allergies were                         reviewed. The patient is competent. The risks and                         benefits of the procedure and the sedation options and                         risks were discussed with the patient. All questions                         were answered and informed consent was obtained.                         Patient identification and proposed procedure were                         verified by the physician, the nurse, the anesthetist                         and the technician in the endoscopy suite. Mental                         Status Examination: alert and oriented. Airway                         Examination: normal oropharyngeal airway and neck                         mobility. Respiratory Examination: clear to                         auscultation. CV Examination: normal. Prophylactic                         Antibiotics: The patient does not require prophylactic  antibiotics. Prior Anticoagulants: The patient has                         taken no previous anticoagulant or antiplatelet agents                         except for aspirin. ASA Grade Assessment: II - A                          patient with mild systemic disease. After reviewing                         the risks and benefits, the patient was deemed in                         satisfactory condition to undergo the procedure. The                         anesthesia plan was to use monitored anesthesia care                         (MAC). Immediately prior to administration of                         medications, the patient was re-assessed for adequacy                         to receive sedatives. The heart rate, respiratory                         rate, oxygen saturations, blood pressure, adequacy of                         pulmonary ventilation, and response to care were                         monitored throughout the procedure. The physical                         status of the patient was re-assessed after the                         procedure.                        After obtaining informed consent, the endoscope was                         passed under direct vision. Throughout the procedure,                         the patient's blood pressure, pulse, and oxygen                         saturations were monitored continuously. The Endoscope                         was introduced through the mouth, and advanced to the  second part of duodenum. The upper GI endoscopy was                         accomplished without difficulty. The patient tolerated                         the procedure well. Findings:      A non-obstructing Schatzki ring was found in the lower third of the       esophagus. A TTS dilator was passed through the scope. Dilation with an       18-19-20 mm balloon dilator was performed to 18 mm. The dilation site       was examined and showed mild mucosal disruption. Estimated blood loss       was minimal.      A small hiatal hernia was present.      Biopsies were taken with a cold forceps in the entire esophagus for       histology. Estimated blood loss was minimal.       Patchy mild inflammation characterized by erythema was found in the       gastric antrum. Biopsies were taken with a cold forceps for Helicobacter       pylori testing. Estimated blood loss was minimal.      The examined duodenum was normal. Impression:            - Non-obstructing Schatzki ring. Dilated.                        - Small hiatal hernia.                        - Gastritis. Biopsied.                        - Normal examined duodenum.                        - Biopsies were taken with a cold forceps for                         histology in the entire esophagus. Recommendation:        - Perform a colonoscopy today. Procedure Code(s):     --- Professional ---                        [phone], Esophagogastroduodenoscopy, flexible,                         transoral; with transendoscopic balloon dilation of                         esophagus (less than 30 mm diameter) Diagnosis Code(s):     --- Professional ---                        K22.2, Esophageal obstruction                        K44.9, Diaphragmatic hernia without obstruction or                         gangrene  K29.70, Gastritis, unspecified, without bleeding                        R13.10, Dysphagia, unspecified                        K21.9, Gastro-esophageal reflux disease without                         esophagitis CPT copyright 2019 American [employer] Association. All rights reserved. The codes documented in this report are preliminary and upon coder review may  be revised to meet current compliance requirements. Andrey Farmer MD, MD 11/29/2020 11:35:06 AM Number of Addenda: 0 Note Initiated On: 11/29/2020 10:37 AM Estimated Blood Loss:  Estimated blood loss was minimal.      [facility]

## 2020-11-29 NOTE — Op Note (Signed)
[facility] Gastroenterology Patient Name: Timothy Yoder Procedure Date: 11/29/2020 10:37 AM MRN: [id number] Account #: [id number] Date of Birth: 1956/03/06 Admit Type: Outpatient Age: 65 Room: [facility] ENDO ROOM 3 Gender: Male Note Status: Finalized Procedure:             Colonoscopy Indications:           Change in bowel habits Providers:             Andrey Farmer MD, MD Medicines:             Monitored Anesthesia Care Complications:         No immediate complications. Estimated blood loss:                         Minimal. Procedure:             Pre-Anesthesia Assessment:                        - Prior to the procedure, a History and Physical was                         performed, and patient medications and allergies were                         reviewed. The patient is competent. The risks and                         benefits of the procedure and the sedation options and                         risks were discussed with the patient. All questions                         were answered and informed consent was obtained.                         Patient identification and proposed procedure were                         verified by the physician, the nurse, the anesthetist                         and the technician in the endoscopy suite. Mental                         Status Examination: alert and oriented. Airway                         Examination: normal oropharyngeal airway and neck                         mobility. Respiratory Examination: clear to                         auscultation. CV Examination: normal. Prophylactic                         Antibiotics: The patient does not require prophylactic  antibiotics. Prior Anticoagulants: The patient has                         taken no previous anticoagulant or antiplatelet agents                         except for aspirin. ASA Grade Assessment: II - A                         patient with mild  systemic disease. After reviewing                         the risks and benefits, the patient was deemed in                         satisfactory condition to undergo the procedure. The                         anesthesia plan was to use monitored anesthesia care                         (MAC). Immediately prior to administration of                         medications, the patient was re-assessed for adequacy                         to receive sedatives. The heart rate, respiratory                         rate, oxygen saturations, blood pressure, adequacy of                         pulmonary ventilation, and response to care were                         monitored throughout the procedure. The physical                         status of the patient was re-assessed after the                         procedure.                        After obtaining informed consent, the colonoscope was                         passed under direct vision. Throughout the procedure,                         the patient's blood pressure, pulse, and oxygen                         saturations were monitored continuously. The                         Colonoscope was introduced through the anus and  advanced to the the terminal ileum. The colonoscopy                         was performed without difficulty. The patient                         tolerated the procedure well. The quality of the bowel                         preparation was good. Findings:      The perianal and digital rectal examinations were normal.      The terminal ileum appeared normal.      A 3 mm polyp was found in the hepatic flexure. The polyp was sessile.       The polyp was removed with a cold snare. Resection and retrieval were       complete. Estimated blood loss was minimal.      A 2 mm polyp was found in the descending colon. The polyp was sessile.       The polyp was removed with a cold snare. Resection and retrieval were        complete. Estimated blood loss was minimal.      Normal mucosa was found in the entire colon. Biopsies for histology were       taken with a cold forceps from the right colon and left colon for       evaluation of microscopic colitis. Estimated blood loss was minimal.      Multiple small-mouthed diverticula were found in the sigmoid colon and       descending colon.      The exam was otherwise without abnormality on direct and retroflexion       views. Impression:            - The examined portion of the ileum was normal.                        - One 3 mm polyp at the hepatic flexure, removed with                         a cold snare. Resected and retrieved.                        - One 2 mm polyp in the descending colon, removed with                         a cold snare. Resected and retrieved.                        - Normal mucosa in the entire examined colon. Biopsied.                        - Diverticulosis in the sigmoid colon and in the                         descending colon.                        - The examination was otherwise normal on direct and  retroflexion views. Recommendation:        - Discharge patient to home.                        - Resume previous diet.                        - Continue present medications.                        - Await pathology results.                        - Repeat colonoscopy for surveillance based on                         pathology results.                        - Return to referring physician as previously                         scheduled. Procedure Code(s):     --- Professional ---                        [phone], Colonoscopy, flexible; with removal of                         tumor(s), polyp(s), or other lesion(s) by snare                         technique                        [id number], 66, Colonoscopy, flexible; with biopsy, single                         or multiple Diagnosis Code(s):     ---  Professional ---                        K63.5, Polyp of colon                        R19.4, Change in bowel habit                        K57.30, Diverticulosis of large intestine without                         perforation or abscess without bleeding CPT copyright 2019 American Medical Association. All rights reserved. The codes documented in this report are preliminary and upon coder review may  be revised to meet current compliance requirements. Andrey Farmer MD, MD 11/29/2020 11:30:44 AM Number of Addenda: 0 Note Initiated On: 11/29/2020 10:37 AM Scope Withdrawal Time: 0 hours 12 minutes 39 seconds  Total Procedure Duration: 0 hours 15 minutes 16 seconds  Estimated Blood Loss:  Estimated blood loss was minimal.      [facility]

## 2020-11-29 NOTE — Anesthesia Procedure Notes (Signed)
Date/Time: 11/29/2020 10:49 AM Performed by: Vaughan Sine Pre-anesthesia Checklist: Patient identified, Emergency Drugs available, Suction available, Patient being monitored and Timeout performed Patient Re-evaluated:Patient Re-evaluated prior to induction Oxygen Delivery Method: Nasal cannula Preoxygenation: Pre-oxygenation with 100% oxygen Induction Type: IV induction Airway Equipment and Method: Bite block Placement Confirmation: positive ETCO2 and CO2 detector

## 2020-11-29 NOTE — Anesthesia Preprocedure Evaluation (Signed)
Anesthesia Evaluation  Patient identified by MRN, date of birth, ID band Patient awake    Reviewed: Allergy & Precautions, H&P , NPO status , Patient's Chart, lab work & pertinent test results, reviewed documented beta blocker date and time   Airway Mallampati: II   Neck ROM: full    Dental  (+) Poor Dentition   Pulmonary neg pulmonary ROS, COPD,  COPD inhaler, former smoker,    Pulmonary exam normal        Cardiovascular Exercise Tolerance: Good hypertension, On Medications + CAD and + Past MI  negative cardio ROS Normal cardiovascular exam Rhythm:regular Rate:Normal     Neuro/Psych negative neurological ROS  negative psych ROS   GI/Hepatic Neg liver ROS, GERD  Medicated,  Endo/Other  negative endocrine ROS  Renal/GU negative Renal ROS  negative genitourinary   Musculoskeletal   Abdominal   Peds  Hematology negative hematology ROS (+)   Anesthesia Other Findings Past Medical History: No date: BPH (benign prostatic hyperplasia) No date: CAD (coronary artery disease)     Comment:  a. 04/2014 anterolateral STEMI/PCI: LM nl, LAD 55m              (2.75x16 Promus DES, D1 50p, LCX nl, RI nl, OM1 100               small/chronic, RCA dominant, [id number](FFR 0.90->Med              Rx), RPDA 50, RPLA 70, EF 60%. No date: COPD (chronic obstructive pulmonary disease) (HCC)     Comment:  Dr FleRaul Del date: Former tobacco use No date: Gastroesophageal reflux disease No date: HTN (hypertension) No date: Hyperlipidemia No date: Hypokalemia 04/01/2018: Malignant [place] of lung ([place]: [facility]   Comment:  Dr MicSherren Mocha date: Stented coronary artery Past Surgical History: No date: CARDIAC CATHETERIZATION 02/15/2016: CHOLECYSTECTOMY; N/A     Comment:  Procedure: LAPAROSCOPIC CHOLECYSTECTOMY WITH               INTRAOPERATIVE CHOLANGIOGRAM;  Surgeon: JarLeonie GreenD;  Location: [facility] ORS;  Service: General;                Laterality: N/A; No date: COLONOSCOPY 08/02/2015: COLONOSCOPY WITH PROPOFOL; N/A     Comment:  Procedure: COLONOSCOPY WITH PROPOFOL;  Surgeon: RobManya SilvasD;  [facility];  Laterality: N/A; 04/2014: CORONARY ANGIOPLASTY     Comment:  STENT 03/26/2018: ENDOBRONCHIAL ULTRASOUND; N/A     Comment:  Procedure: ENDOBRONCHIAL ULTRASOUND;  Surgeon: KasFlora LippsD;  Location: [facility] ORS;  Service:               Cardiopulmonary;  Laterality: N/A; 08/02/2015: ESOPHAGOGASTRODUODENOSCOPY (EGD) WITH PROPOFOL; N/A     Comment:  Procedure: ESOPHAGOGASTRODUODENOSCOPY (EGD) WITH               PROPOFOL;  Surgeon: RobManya SilvasD;  [facility];  Service: Endoscopy;  Laterality: N/A; 03/26/2018: FLEXIBLE BRONCHOSCOPY; N/A     Comment:  Procedure: FLEXIBLE BRONCHOSCOPY;  Surgeon: KasMortimer Fries  Maretta Bees, MD;  Location: [facility] ORS;  Service:               Cardiopulmonary;  Laterality: N/A; No date: KNEE ARTHROSCOPY; Left 05/03/2014: LEFT HEART CATHETERIZATION WITH CORONARY ANGIOGRAM; N/A     Comment:  Procedure: LEFT HEART CATHETERIZATION WITH CORONARY               ANGIOGRAM;  Surgeon: Blane Ohara, MD;  Location: [facility]               CATH LAB;  Service: Cardiovascular;  Laterality: N/A; 05/05/2014: PERCUTANEOUS CORONARY STENT INTERVENTION (PCI-S); N/A     Comment:  Procedure: PERCUTANEOUS CORONARY STENT INTERVENTION               (PCI-S);  Surgeon: Peter M Martinique, MD;  Location: [facility] CATH              LAB;  Service: Cardiovascular;  Laterality: N/A; 04/04/2018: PORTACATH PLACEMENT; Left     Comment:  Procedure: INSERTION PORT-A-CATH;  Surgeon: [name], MD;  Location: [facility] ORS;  Service: General;                Laterality: Left; No date: UPPER GI ENDOSCOPY BMI    Body Mass Index: 25.39 kg/m      Reproductive/Obstetrics negative OB ROS                             Anesthesia Physical Anesthesia Plan  ASA: 3  Anesthesia Plan: General   Post-op Pain Management:    Induction:   PONV Risk Score and Plan:   Airway Management Planned:   Additional Equipment:   Intra-op Plan:   Post-operative Plan:   Informed Consent: I have reviewed the patients History and Physical, chart, labs and discussed the procedure including the risks, benefits and alternatives for the proposed anesthesia with the patient or authorized representative who has indicated his/her understanding and acceptance.     Dental Advisory Given  Plan Discussed with: CRNA  Anesthesia Plan Comments:         Anesthesia Quick Evaluation

## 2020-11-29 NOTE — Transfer of Care (Signed)
Immediate Anesthesia Transfer of Care Note  Patient: Timothy Yoder  Procedure(s) Performed: COLONOSCOPY WITH PROPOFOL ESOPHAGOGASTRODUODENOSCOPY (EGD) WITH PROPOFOL  Patient Location: PACU  Anesthesia Type:General  Level of Consciousness: awake and sedated  Airway & Oxygen Therapy: Patient Spontanous Breathing and Patient connected to nasal cannula oxygen  Post-op Assessment: Report given to RN and Post -op Vital signs reviewed and stable  Post vital signs: Reviewed and stable  Last Vitals:  Vitals Value Taken Time  BP    Temp    Pulse    Resp    SpO2      Last Pain:  Vitals:   11/29/20 1003  TempSrc: Temporal  PainSc: 0-No pain         Complications: No notable events documented.

## 2020-11-30 ENCOUNTER — Inpatient Hospital Stay: Payer: Medicare Other | Attending: Oncology

## 2020-11-30 ENCOUNTER — Inpatient Hospital Stay: Payer: Medicare Other

## 2020-11-30 DIAGNOSIS — Z95828 Presence of other vascular implants and grafts: Secondary | ICD-10-CM

## 2020-11-30 DIAGNOSIS — Z452 Encounter for adjustment and management of vascular access device: Secondary | ICD-10-CM | POA: Diagnosis not present

## 2020-11-30 DIAGNOSIS — Z85118 Personal history of other malignant neoplasm of bronchus and lung: Secondary | ICD-10-CM | POA: Insufficient documentation

## 2020-11-30 LAB — SURGICAL PATHOLOGY

## 2020-11-30 MED ORDER — HEPARIN SOD (PORK) LOCK FLUSH 100 UNIT/ML IV SOLN
500.0000 [IU] | Freq: Once | INTRAVENOUS | Status: AC
  Administered 2020-11-30: 500 [IU] via INTRAVENOUS
  Filled 2020-11-30: qty 5

## 2020-11-30 MED ORDER — SODIUM CHLORIDE 0.9% FLUSH
10.0000 mL | Freq: Once | INTRAVENOUS | Status: AC
  Administered 2020-11-30: 10 mL via INTRAVENOUS
  Filled 2020-11-30: qty 10

## 2020-11-30 NOTE — Anesthesia Postprocedure Evaluation (Signed)
Anesthesia Post Note  Patient: Timothy Yoder  Procedure(s) Performed: COLONOSCOPY WITH PROPOFOL ESOPHAGOGASTRODUODENOSCOPY (EGD) WITH PROPOFOL  Patient location during evaluation: PACU Anesthesia Type: General Level of consciousness: awake and alert Pain management: pain level controlled Vital Signs Assessment: post-procedure vital signs reviewed and stable Respiratory status: spontaneous breathing, nonlabored ventilation, respiratory function stable and patient connected to nasal cannula oxygen Cardiovascular status: blood pressure returned to baseline and stable Postop Assessment: no apparent nausea or vomiting Anesthetic complications: no   No notable events documented.   Last Vitals:  Vitals:   11/29/20 1125 11/29/20 1135  BP: (!) 122/91 120/88  Pulse: 97 87  Resp: 20 19  Temp:    SpO2: 92% 91%    Last Pain:  Vitals:   11/29/20 1205  TempSrc:   PainSc: 0-No pain                 Molli Barrows

## 2021-01-17 ENCOUNTER — Other Ambulatory Visit: Payer: Self-pay

## 2021-01-17 ENCOUNTER — Ambulatory Visit: Payer: Medicare Other

## 2021-01-25 ENCOUNTER — Other Ambulatory Visit: Payer: Self-pay

## 2021-01-25 ENCOUNTER — Inpatient Hospital Stay: Payer: Medicare Other

## 2021-01-25 ENCOUNTER — Inpatient Hospital Stay: Payer: Medicare Other | Attending: Oncology

## 2021-01-25 DIAGNOSIS — C3431 Malignant neoplasm of lower lobe, right bronchus or lung: Secondary | ICD-10-CM | POA: Insufficient documentation

## 2021-01-25 DIAGNOSIS — Z452 Encounter for adjustment and management of vascular access device: Secondary | ICD-10-CM | POA: Insufficient documentation

## 2021-01-25 DIAGNOSIS — Z95828 Presence of other vascular implants and grafts: Secondary | ICD-10-CM

## 2021-01-25 MED ORDER — HEPARIN SOD (PORK) LOCK FLUSH 100 UNIT/ML IV SOLN
500.0000 [IU] | Freq: Once | INTRAVENOUS | Status: AC
  Administered 2021-01-25: 500 [IU] via INTRAVENOUS
  Filled 2021-01-25: qty 5

## 2021-01-25 MED ORDER — SODIUM CHLORIDE 0.9% FLUSH
10.0000 mL | Freq: Once | INTRAVENOUS | Status: AC
  Administered 2021-01-25: 10 mL via INTRAVENOUS
  Filled 2021-01-25: qty 10

## 2021-01-27 DIAGNOSIS — D127 Benign neoplasm of rectosigmoid junction: Secondary | ICD-10-CM | POA: Diagnosis not present

## 2021-03-22 ENCOUNTER — Inpatient Hospital Stay: Payer: Medicare Other

## 2021-03-22 ENCOUNTER — Other Ambulatory Visit: Payer: Self-pay

## 2021-03-22 ENCOUNTER — Inpatient Hospital Stay: Payer: Medicare Other | Attending: Oncology

## 2021-03-22 DIAGNOSIS — C3431 Malignant neoplasm of lower lobe, right bronchus or lung: Secondary | ICD-10-CM | POA: Diagnosis present

## 2021-03-22 DIAGNOSIS — Z452 Encounter for adjustment and management of vascular access device: Secondary | ICD-10-CM | POA: Insufficient documentation

## 2021-03-22 DIAGNOSIS — J449 Chronic obstructive pulmonary disease, unspecified: Secondary | ICD-10-CM | POA: Insufficient documentation

## 2021-03-22 DIAGNOSIS — Z95828 Presence of other vascular implants and grafts: Secondary | ICD-10-CM

## 2021-03-22 LAB — CBC WITH DIFFERENTIAL/PLATELET
Abs Immature Granulocytes: 0.03 10*3/uL (ref 0.00–0.07)
Basophils Absolute: 0 10*3/uL (ref 0.0–0.1)
Basophils Relative: 1 %
Eosinophils Absolute: 0.1 10*3/uL (ref 0.0–0.5)
Eosinophils Relative: 2 %
HCT: 44.7 % (ref 39.0–52.0)
Hemoglobin: 15.1 g/dL (ref 13.0–17.0)
Immature Granulocytes: 1 %
Lymphocytes Relative: 23 %
Lymphs Abs: 1.3 10*3/uL (ref 0.7–4.0)
MCH: 32.1 pg (ref 26.0–34.0)
MCHC: 33.8 g/dL (ref 30.0–36.0)
MCV: 94.9 fL (ref 80.0–100.0)
Monocytes Absolute: 0.5 10*3/uL (ref 0.1–1.0)
Monocytes Relative: 10 %
Neutro Abs: 3.6 10*3/uL (ref 1.7–7.7)
Neutrophils Relative %: 63 %
Platelets: 169 10*3/uL (ref 150–400)
RBC: 4.71 MIL/uL (ref 4.22–5.81)
RDW: 13.4 % (ref 11.5–15.5)
WBC: 5.5 10*3/uL (ref 4.0–10.5)
nRBC: 0 % (ref 0.0–0.2)

## 2021-03-22 LAB — COMPREHENSIVE METABOLIC PANEL
ALT: 22 U/L (ref 0–44)
AST: 21 U/L (ref 15–41)
Albumin: 3.8 g/dL (ref 3.5–5.0)
Alkaline Phosphatase: 102 U/L (ref 38–126)
Anion gap: 9 (ref 5–15)
BUN: 11 mg/dL (ref 8–23)
CO2: 26 mmol/L (ref 22–32)
Calcium: 8.6 mg/dL — ABNORMAL LOW (ref 8.9–10.3)
Chloride: 105 mmol/L (ref 98–111)
Creatinine, Ser: 0.99 mg/dL (ref 0.61–1.24)
GFR, Estimated: >60 mL/min (ref >60)
Glucose, Bld: 118 mg/dL — ABNORMAL HIGH (ref 70–99)
Potassium: 3.5 mmol/L (ref 3.5–5.1)
Sodium: 140 mmol/L (ref 135–145)
Total Bilirubin: 0.8 mg/dL (ref 0.3–1.2)
Total Protein: 6.3 g/dL — ABNORMAL LOW (ref 6.5–8.1)

## 2021-03-22 LAB — LACTATE DEHYDROGENASE: LDH: 129 U/L (ref 98–192)

## 2021-03-22 MED ORDER — SODIUM CHLORIDE 0.9% FLUSH
10.0000 mL | Freq: Once | INTRAVENOUS | Status: AC
  Administered 2021-03-22: 10 mL via INTRAVENOUS
  Filled 2021-03-22: qty 10

## 2021-03-22 MED ORDER — HEPARIN SOD (PORK) LOCK FLUSH 100 UNIT/ML IV SOLN
500.0000 [IU] | Freq: Once | INTRAVENOUS | Status: AC
  Administered 2021-03-22: 500 [IU] via INTRAVENOUS
  Filled 2021-03-22: qty 5

## 2021-03-24 ENCOUNTER — Other Ambulatory Visit: Payer: Self-pay

## 2021-03-24 ENCOUNTER — Other Ambulatory Visit: Payer: Medicare Other

## 2021-03-24 ENCOUNTER — Ambulatory Visit
Admission: RE | Admit: 2021-03-24 | Discharge: 2021-03-24 | Disposition: A | Discharge status: Home / Self Care | Payer: Medicare Other | Source: Ambulatory Visit | Attending: Oncology | Admitting: Oncology

## 2021-03-24 ENCOUNTER — Ambulatory Visit: Admission: RE | Admit: 2021-03-24 | Payer: Medicare Other | Source: Ambulatory Visit

## 2021-03-24 DIAGNOSIS — C3431 Malignant neoplasm of lower lobe, right bronchus or lung: Secondary | ICD-10-CM | POA: Diagnosis not present

## 2021-03-24 MED ORDER — IOHEXOL 300 MG/ML  SOLN
100.0000 mL | Freq: Once | INTRAMUSCULAR | Status: AC | PRN
  Administered 2021-03-24: 100 mL via INTRAVENOUS

## 2021-03-25 ENCOUNTER — Encounter: Payer: Self-pay | Admitting: Oncology

## 2021-03-25 ENCOUNTER — Inpatient Hospital Stay: Payer: Medicare Other | Attending: Oncology | Admitting: Oncology

## 2021-03-25 VITALS — BP 138/89 | HR 84 | Temp 97.6°F | Resp 16 | Wt 162.6 lb

## 2021-03-25 DIAGNOSIS — Z85118 Personal history of other malignant neoplasm of bronchus and lung: Secondary | ICD-10-CM | POA: Diagnosis not present

## 2021-03-25 DIAGNOSIS — J449 Chronic obstructive pulmonary disease, unspecified: Secondary | ICD-10-CM | POA: Diagnosis not present

## 2021-03-25 DIAGNOSIS — Z95828 Presence of other vascular implants and grafts: Secondary | ICD-10-CM

## 2021-03-25 DIAGNOSIS — C3431 Malignant neoplasm of lower lobe, right bronchus or lung: Secondary | ICD-10-CM | POA: Diagnosis not present

## 2021-03-25 NOTE — Progress Notes (Signed)
Hematology/Oncology follow up note [phone] [phone]   Patient Care Team: Baxter Hire, MD as PCP - General (Internal Medicine) Sherren Mocha, MD as PCP - Cardiology (Cardiology) Telford Nab, RN as Registered Nurse Baxter Hire, MD as Consulting Physician (Internal Medicine) Noreene Filbert, MD as Referring Physician (Radiation Oncology) Earlie Server, MD as Consulting Physician (Oncology)  REFERRING PROVIDER: Dr.Fleming REASON FOR VISIT:  Follow-up for management of stage III non-small cell lung cancer   HISTORY OF PRESENTING ILLNESS:  Timothy Yoder is a  65 y.o.  male with PMH listed below who was referred to me for evaluation of lung mass.  Patient is a former smoker quit smoking 7 years ago.  40-pack-year. Patient chest lung cancer screening on 03/11/2018. CT scan showed 2.4 cm right lower lobe nodule with a new necrotic.  Subcarinal lymph node. PET scan showed hypermetabolic right lower pulmonary nodule, consistent with primary bronchogenic carcinoma.  Fluid density subcarinal structure demonstrate no significant hypermetabolism.  Although this could represent an incidental benign lesion such as a cyst given the interval development since 02/24/2017, necrotic adenopathy cannot be excluded and tissue sampling should be considered.  # underwent biopsy via bronchoscopy.  Subcarinal lymph node biopsy positive for non-small cell lung cancer, favoring poorly differentiated adenocarcinoma.  Stage IIIA T1c N2M0 #Mediport was placed by Dr. Genevive Bi  # #Elevated alkaline phosphatase. Patient has been evaluated by gastroenterology.  Liver biopsy showed mild sinusoidal dilation and congestion.  Minimal changes suggestive of hepatic process. Iron stain demonstrate minimal focal hepatocellular iron. I discussed with Dr.Wohl via secure chat about patient's pathology result.  No restriction of proceeding with immunotherapy.  Continue watch for the labs.   # Cancer  Treatment 04/26/2018 -06/11/2018 Concurrent Chemotherapy [weekly Carbo and Taxol] with RT.  07/10/2018 maintenance Durvalumab. Finished immunotherapy in May 2021 INTERVAL HISTORY Timothy Yoder is a 65 y.o. male who has above history reviewed by me today presents for follow up visit for management of stage III non-small cell lung cancer.   Patient reports feeling well well.  No new complaints.  Denies any unintentional weight loss, cough, hemoptysis, fever or chills.  Review of Systems  Constitutional:  Negative for appetite change, chills, diaphoresis, fatigue, fever and unexpected weight change.  HENT:   Negative for hearing loss, lump/mass, nosebleeds, sore throat and voice change.   Eyes:  Negative for eye problems and icterus.  Respiratory:  Negative for chest tightness, cough, hemoptysis, shortness of breath and wheezing.   Cardiovascular:  Negative for chest pain and leg swelling.  Gastrointestinal:  Negative for abdominal distention, abdominal pain, blood in stool, diarrhea, nausea and rectal pain.  Endocrine: Negative for hot flashes.  Genitourinary:  Negative for bladder incontinence, difficulty urinating, dysuria, frequency, hematuria and nocturia.   Musculoskeletal:  Negative for arthralgias, back pain, flank pain, gait problem and myalgias.  Skin:  Negative for itching and rash.  Neurological:  Negative for dizziness, gait problem, headaches, light-headedness, numbness and seizures.  Hematological:  Negative for adenopathy. Does not bruise/bleed easily.  Psychiatric/Behavioral:  Negative for confusion and decreased concentration. The patient is not nervous/anxious.   MEDICAL HISTORY:  Past Medical History:  Diagnosis Date   BPH (benign prostatic hyperplasia)    CAD (coronary artery disease)    a. 04/2014 anterolateral STEMI/PCI: LM nl, LAD 76m(2.75x16 Promus DES, D1 50p, LCX nl, RI nl, OM1 100 small/chronic, RCA dominant, [id number](FFR 0.90->Med Rx), RPDA 50, RPLA 70, EF 60%.    COPD (chronic obstructive  pulmonary disease) ([place])    Dr Raul Del   Former tobacco use    Gastroesophageal reflux disease    HTN (hypertension)    Hyperlipidemia    Hypokalemia    Malignant neoplasm of lung ([place]) 04/01/2018   Myocardial infarction ([place]) 04/2014   Dr Sherren Mocha   Stented coronary artery     SURGICAL HISTORY: Past Surgical History:  Procedure Laterality Date   CARDIAC CATHETERIZATION     CHOLECYSTECTOMY N/A 02/15/2016   Procedure: LAPAROSCOPIC CHOLECYSTECTOMY WITH INTRAOPERATIVE CHOLANGIOGRAM;  Surgeon: Leonie Green, MD;  Location: [facility] ORS;  Service: General;  Laterality: N/A;   COLONOSCOPY     COLONOSCOPY WITH PROPOFOL N/A 08/02/2015   Procedure: COLONOSCOPY WITH PROPOFOL;  Surgeon: Manya Silvas, MD;  Location: [facility] ENDOSCOPY;  Service: Endoscopy;  Laterality: N/A;   COLONOSCOPY WITH PROPOFOL N/A 11/29/2020   Procedure: COLONOSCOPY WITH PROPOFOL;  Surgeon: Lesly Rubenstein, MD;  Location: [facility] ENDOSCOPY;  Service: Endoscopy;  Laterality: N/A;   CORONARY ANGIOPLASTY  04/2014   STENT   ENDOBRONCHIAL ULTRASOUND N/A 03/26/2018   Procedure: ENDOBRONCHIAL ULTRASOUND;  Surgeon: Flora Lipps, MD;  Location: [facility] ORS;  Service: Cardiopulmonary;  Laterality: N/A;   ESOPHAGOGASTRODUODENOSCOPY (EGD) WITH PROPOFOL N/A 08/02/2015   Procedure: ESOPHAGOGASTRODUODENOSCOPY (EGD) WITH PROPOFOL;  Surgeon: Manya Silvas, MD;  Location: [facility] ENDOSCOPY;  Service: Endoscopy;  Laterality: N/A;   ESOPHAGOGASTRODUODENOSCOPY (EGD) WITH PROPOFOL N/A 11/29/2020   Procedure: ESOPHAGOGASTRODUODENOSCOPY (EGD) WITH PROPOFOL;  Surgeon: Lesly Rubenstein, MD;  Location: [facility] ENDOSCOPY;  Service: Endoscopy;  Laterality: N/A;   FLEXIBLE BRONCHOSCOPY N/A 03/26/2018   Procedure: FLEXIBLE BRONCHOSCOPY;  Surgeon: Flora Lipps, MD;  Location: [facility] ORS;  Service: Cardiopulmonary;  Laterality: N/A;   KNEE ARTHROSCOPY Left    LEFT HEART CATHETERIZATION WITH CORONARY ANGIOGRAM N/A 05/03/2014    Procedure: LEFT HEART CATHETERIZATION WITH CORONARY ANGIOGRAM;  Surgeon: Blane Ohara, MD;  Location: [facility] CATH LAB;  Service: Cardiovascular;  Laterality: N/A;   PERCUTANEOUS CORONARY STENT INTERVENTION (PCI-S) N/A 05/05/2014   Procedure: PERCUTANEOUS CORONARY STENT INTERVENTION (PCI-S);  Surgeon: Peter M Martinique, MD;  Location: [facility] CATH LAB;  Service: Cardiovascular;  Laterality: N/A;   PORTACATH PLACEMENT Left 04/04/2018   Procedure: INSERTION PORT-A-CATH;  Surgeon: [name], MD;  Location: [facility] ORS;  Service: General;  Laterality: Left;   UPPER GI ENDOSCOPY      SOCIAL HISTORY: Social History   Socioeconomic History   Marital status: Married    Spouse name: Not on file   Number of children: Not on file   Years of education: Not on file   Highest education level: Not on file  Occupational History   Not on file  Tobacco Use   Smoking status: Former    Packs/day: 1.00    Years: 35.00    Pack years: 35.00    Types: Cigarettes    Quit date: 02/14/2011    Years since quitting: 10.1   Smokeless tobacco: Never  Vaping Use   Vaping Use: Never used  Substance and Sexual Activity   Alcohol use: No   Drug use: No   Sexual activity: Not on file  Other Topics Concern   Not on file  Social History Narrative   The patient is married. He lives in [place]. He works full-time. He quit smoking in 2013.   Social Determinants of Health   Financial Resource Strain: Not on file  Food Insecurity: Not on file  Transportation Needs: Not on file  Physical Activity: Not on file  Stress:  Not on file  Social Connections: Not on file  Intimate Partner Violence: Not on file    FAMILY HISTORY: Family History  Problem Relation Age of Onset   CAD Mother 36       Died of an MI   CAD Father 12       Died of a massive MI   COPD Sister    Lung cancer Brother    Bone cancer Paternal Uncle    Colon cancer Neg Hx    Breast cancer Neg Hx     ALLERGIES:  has No Known  Allergies.  MEDICATIONS:  Current Outpatient Medications  Medication Sig Dispense Refill   acetaminophen (TYLENOL) 500 MG tablet Take 1,000 mg by mouth every 6 (six) hours as needed (for pain.).     albuterol (PROAIR HFA) 108 (90 Base) MCG/ACT inhaler Inhale 2 puffs into the lungs every 6 (six) hours as needed for wheezing or shortness of breath. 54 g 4   aspirin EC 81 MG tablet Take 81 mg by mouth daily.     atorvastatin (LIPITOR) 80 MG tablet TAKE 1 TABLET DAILY AT 6 P.M. 90 tablet 3   carvedilol (COREG) 6.25 MG tablet TAKE 1 TABLET TWICE A DAY (PLEASE KEEP UPCOMING APPOINTMENT IN MAY WITH DR. Burt Knack BEFORE ANYMORE REFILLS, THANK YOU) 180 tablet 3   famotidine (PEPCID) 40 MG tablet Take by mouth.     finasteride (PROSCAR) 5 MG tablet Take 5 mg by mouth daily.     lidocaine-prilocaine (EMLA) cream Apply small amount to port site 1 hour prior to [facility] being accessed and cover with plastic wrap 30 g 1   nitroGLYCERIN (NITROSTAT) 0.4 MG SL tablet Place 1 tablet (0.4 mg total) under the tongue every 5 (five) minutes as needed for chest pain. X 3 doses 25 tablet 3   omeprazole (PRILOSEC) 40 MG capsule Take 1 capsule (40 mg total) by mouth 2 (two) times daily. 180 capsule 1   ondansetron (ZOFRAN) 8 MG tablet      tamsulosin (FLOMAX) 0.4 MG CAPS capsule Take 0.4 mg by mouth daily after supper.     umeclidinium-vilanterol (ANORO ELLIPTA) 62.5-25 MCG/INH AEPB Inhale 1 puff into the lungs daily. 3 each 4   Molnupiravir 200 MG CAPS Take by mouth.     No current facility-administered medications for this visit.     PHYSICAL EXAMINATION: ECOG PERFORMANCE STATUS: 1 - Symptomatic but completely ambulatory Vitals:   03/25/21 1201  BP: 138/89  Pulse: 84  Resp: 16  Temp: 97.6 F (36.4 C)  SpO2: 98%   Filed Weights   03/25/21 1201  Weight: 162 lb 9.6 oz (73.8 kg)    Physical Exam Constitutional:      General: He is not in acute distress. HENT:     Head: Normocephalic and atraumatic.  Eyes:      General: No scleral icterus. Cardiovascular:     Rate and Rhythm: Normal rate and regular rhythm.     Heart sounds: Normal heart sounds.  Pulmonary:     Effort: Pulmonary effort is normal. No respiratory distress.     Breath sounds: No wheezing.  Abdominal:     General: Bowel sounds are normal. There is no distension.     Palpations: Abdomen is soft.  Musculoskeletal:        General: No deformity. Normal range of motion.     Cervical back: Normal range of motion and neck supple.  Skin:    General: Skin is warm and dry.  Findings: No erythema or rash.  Neurological:     Mental Status: He is alert and oriented to person, place, and time. Mental status is at baseline.     Cranial Nerves: No cranial nerve deficit.     Coordination: Coordination normal.  Psychiatric:        Mood and Affect: Mood normal.     LABORATORY DATA:  I have reviewed the data as listed Lab Results  Component Value Date   WBC 5.5 03/22/2021   HGB 15.1 03/22/2021   HCT 44.7 03/22/2021   MCV 94.9 03/22/2021   PLT 169 03/22/2021   Recent Labs    05/21/20 0929 08/30/20 0954 09/23/20 1010 03/22/21 1324  NA 137  --  136 140  K 3.9  --  3.6 3.5  CL 102  --  106 105  CO2 28  --  25 26  GLUCOSE 108*  --  109* 118*  BUN 9  --  10 11  CREATININE 1.00  --  1.07 0.99  CALCIUM 8.6*  --  8.7* 8.6*  GFRNONAA >60  --  >60 >60  PROT 6.7 6.7 6.5 6.3*  ALBUMIN 3.7 4.4 3.9 3.8  AST _0 ALT _1 ALKPHOS 104 134* 99 102  BILITOT 0.8 0.7 1.0 0.8  BILIDIR  --  0.17  --   --     Iron/TIBC/Ferritin/ %Sat    Component Value Date/Time   IRON 41 (L) 11/21/2018 0856   TIBC 260 11/21/2018 0856   FERRITIN 150 11/21/2018 0856   IRONPCTSAT 16 (L) 11/21/2018 0856    RADIOGRAPHIC STUDIES: I have personally reviewed the radiological images as listed and agreed with the findings in the report.  CT CHEST ABDOMEN PELVIS W CONTRAST  Result Date: 03/25/2021 CLINICAL DATA:  Aw 65 year old male  presents for follow-up of lung cancer, history of RIGHT lower lobe neoplasm. EXAM: CT CHEST, ABDOMEN, AND PELVIS WITH CONTRAST TECHNIQUE: Multidetector CT imaging of the chest, abdomen and pelvis was performed following the standard protocol during bolus administration of intravenous contrast. CONTRAST:  131m OMNIPAQUE IOHEXOL 300 MG/ML  SOLN COMPARISON:  September 22, 2020. FINDINGS: CT CHEST FINDINGS Cardiovascular: Calcified and noncalcified atheromatous plaque of the thoracic aorta. No aneurysmal dilation. Three-vessel coronary artery calcification. No pericardial effusion. LEFT-sided Port-A-Cath terminates in the distal superior vena cava entering via subclavian approach. Normal caliber central pulmonary vessels. Mediastinum/Nodes: No thoracic inlet, axillary, mediastinal or hilar adenopathy. Esophagus grossly normal. Lungs/Pleura: Stable apical scarring in the RIGHT upper lobe. Stable scarring along the LEFT mediastinal border in the LEFT upper lobe. Post treatment changes in the RIGHT lung base without change from previous imaging. Stable Peri fissural nodule in the RIGHT chest 3-4 mm (image 92/3) no effusion. No consolidation. No new or suspicious pulmonary nodule. Signs of pulmonary emphysema as before. Musculoskeletal: See below for full musculoskeletal details. CT ABDOMEN PELVIS FINDINGS Hepatobiliary: Hepatic steatosis. No focal, suspicious hepatic lesion. Post cholecystectomy without substantial biliary duct distension. Portal vein is patent. Pancreas: Normal, without mass, inflammation or ductal dilatation. Spleen: Spleen normal size and contour. Adrenals/Urinary Tract: Adrenal glands are unremarkable. Symmetric renal enhancement. No sign of hydronephrosis. No suspicious renal lesion or perinephric stranding. Urinary bladder is grossly unremarkable. Stomach/Bowel: Moderately large duodenal diverticulum arises from the third portion of the duodenum just proximal the ligament of Treitz. No signs of adjacent  stranding. No evidence of bowel obstruction. Normal appendix. Stool throughout much of the colon. Sigmoid diverticular changes without signs  of adjacent inflammation. Vascular/Lymphatic: Aortic atherosclerosis. No sign of aneurysm. Smooth contour of the IVC. There is no gastrohepatic or hepatoduodenal ligament lymphadenopathy. No retroperitoneal or mesenteric lymphadenopathy. No pelvic sidewall lymphadenopathy. Reproductive: Unremarkable by CT. Other: No ascites.  No free air. Musculoskeletal: No acute bone finding. No destructive bone process. Spinal degenerative changes. IMPRESSION: Stable post treatment changes in the RIGHT lower lobe.w. No evidence of recurrent or metastatic disease. Hepatic steatosis. Moderately large duodenal diverticulum. Sigmoid diverticular changes without signs of adjacent inflammation. Three-vessel coronary artery calcification. Aortic Atherosclerosis (ICD10-I70.0) and Emphysema (ICD10-J43.9). Electronically Signed   By: Zetta Bills M.D.   On: 03/25/2021 12:33      ASSESSMENT & PLAN:  1. Malignant neoplasm of lower lobe of right lung ([place])   2. Port-A-Cath in place   3. Chronic obstructive pulmonary disease, unspecified COPD type ([place])    Cancer Staging  Malignant neoplasm of lower lobe of right lung [facility]) Staging form: Lung, AJCC 8th Edition - Clinical stage from 05/13/2018: Stage IIIA (cT1c, cN2, cM0) - Signed by Earlie Server, MD on 05/13/2018  #Stage IIIA lung adenocarcinoma Labs are reviewed and discussed with patient 03/24/2021, CT chest abdomen pelvis with contrast showed no evidence of metastatic or recurrent disease.  Hepatic steatosis.  Moderate large duodenal diverticulum.  Sigmoid diverticular changes without signs of adjacent inflammation.  Three-vessel coronary artery calcification.  Aortic atherosclerosis and emphysema. Recommend patient to follow-up in 6 months.  Repeat CT scan for surveillance.  #Hypocalcemia, calcium level is 8.6.  Recommend patient to take  calcium supplementation. Port-A-Cath in place, continue port flush every 8 weeks.  We discussed about port discontinuation.  Patient would like to consider that and update me.  For now continue port flush. COPD, continue albuterol inhaler  Follow up in 6 months.  Earlie Server, MD, PhD 03/25/21

## 2021-03-25 NOTE — Progress Notes (Signed)
Patient here for follow up he has no concerns today.

## 2021-03-26 ENCOUNTER — Encounter: Payer: Self-pay | Admitting: Oncology

## 2021-03-31 ENCOUNTER — Ambulatory Visit: Payer: Medicare Other | Admitting: Oncology

## 2021-04-01 ENCOUNTER — Other Ambulatory Visit: Payer: Self-pay | Admitting: Cardiovascular Disease

## 2021-05-16 ENCOUNTER — Telehealth: Payer: Self-pay | Admitting: Oncology

## 2021-05-16 NOTE — Telephone Encounter (Signed)
Caregiver called to reschedule pt's appt. Would like a morning appt. Call back at [phone]

## 2021-05-17 ENCOUNTER — Inpatient Hospital Stay: Payer: Medicare Other | Attending: Oncology

## 2021-05-17 ENCOUNTER — Other Ambulatory Visit: Payer: Self-pay

## 2021-05-17 ENCOUNTER — Inpatient Hospital Stay: Payer: Medicare Other

## 2021-05-17 DIAGNOSIS — Z95828 Presence of other vascular implants and grafts: Secondary | ICD-10-CM

## 2021-05-17 DIAGNOSIS — Z452 Encounter for adjustment and management of vascular access device: Secondary | ICD-10-CM | POA: Insufficient documentation

## 2021-05-17 DIAGNOSIS — Z85118 Personal history of other malignant neoplasm of bronchus and lung: Secondary | ICD-10-CM | POA: Insufficient documentation

## 2021-05-17 MED ORDER — HEPARIN SOD (PORK) LOCK FLUSH 100 UNIT/ML IV SOLN
500.0000 [IU] | Freq: Once | INTRAVENOUS | Status: AC
  Administered 2021-05-17: 08:00:00 500 [IU] via INTRAVENOUS
  Filled 2021-05-17: qty 5

## 2021-05-17 MED ORDER — SODIUM CHLORIDE 0.9% FLUSH
10.0000 mL | Freq: Once | INTRAVENOUS | Status: AC
  Administered 2021-05-17: 08:00:00 10 mL via INTRAVENOUS
  Filled 2021-05-17: qty 10

## 2021-06-01 DIAGNOSIS — J449 Chronic obstructive pulmonary disease, unspecified: Secondary | ICD-10-CM

## 2021-06-01 MED ORDER — ALBUTEROL SULFATE HFA 108 (90 BASE) MCG/ACT IN AERS
2.0000 | INHALATION_SPRAY | Freq: Four times a day (QID) | RESPIRATORY_TRACT | 0 refills | Status: DC | PRN
Start: 2021-06-01 — End: 2021-06-03

## 2021-06-03 ENCOUNTER — Other Ambulatory Visit: Payer: Self-pay

## 2021-06-03 DIAGNOSIS — J449 Chronic obstructive pulmonary disease, unspecified: Secondary | ICD-10-CM

## 2021-06-03 MED ORDER — ALBUTEROL SULFATE HFA 108 (90 BASE) MCG/ACT IN AERS: 2.0000 | INHALATION_SPRAY | Freq: Four times a day (QID) | RESPIRATORY_TRACT | 0 refills | Status: DC | PRN

## 2021-07-11 ENCOUNTER — Encounter: Payer: Self-pay | Admitting: Adult Health

## 2021-07-11 ENCOUNTER — Other Ambulatory Visit: Payer: Self-pay

## 2021-07-11 ENCOUNTER — Ambulatory Visit (INDEPENDENT_AMBULATORY_CARE_PROVIDER_SITE_OTHER): Payer: Medicare Other | Admitting: Adult Health

## 2021-07-11 DIAGNOSIS — C3431 Malignant neoplasm of lower lobe, right bronchus or lung: Secondary | ICD-10-CM

## 2021-07-11 DIAGNOSIS — J449 Chronic obstructive pulmonary disease, unspecified: Secondary | ICD-10-CM | POA: Diagnosis not present

## 2021-07-11 NOTE — Progress Notes (Signed)
? ?@Patient  ID: Timothy Yoder, male    DOB: 04-20-1956, 66 y.o.   MRN: [id number] ? ?Chief Complaint  ?Patient presents with  ? Follow-up  ? ? ?Referring provider: ?Baxter Hire, MD ? 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 ?TEST/EVENTS :  ?03/11/2018 CT chest lung screen ?1. New 2.4 cm right lower lobe nodule with a new necrotic appearing subcarinal lymph node. Lung-RADS 4B, suspicious. Additional imaging evaluation or consultation with Pulmonology or Thoracic Surgery recommended. These results will be called to the ordering clinician or representative by the Radiologist Assistant, and communication documented in the PACS or zVision Dashboard. 2. Aortic atherosclerosis (ICD10-170.0). Coronary arterycalcification.3.  Emphysema (ICD10-J43.9). ?  ?03/13/2018 PET scan ?# Hypermetabolic right lower lobe pulmonary nodule, consistent with primary bronchogenic carcinoma. ?Fluid density subcarinal structure demonstrates no significant hypermetabolism. Although this could represent an incidental benign ?lesion such as a bronchogenic cyst, given interval development since 02/24/2017, necrotic adenopathy cannot be excluded and tissue ?sampling should be considered. ? ?CT chest 03/24/21 s/p tx changes in RLL , no evidence of recurrent or metastaic disease .  ?Hepatic steatosis.  Moderately large duodenal diverticulum. Sigmoid diverticular changes without signs of adjacent inflammation. Three-vessel coronary artery calcification. ? ?# Cancer Treatment ?04/26/2018 -06/11/2018 Concurrent Chemotherapy [weekly Carbo and Taxol] with RT.  ?07/10/2018 maintenance Durvalumab. Finished immunotherapy in May 2021 ? ?10/06/2019: FEV1 was 56%, ratio 53, FVC 79%, no significant bronchodilator response.  DLCO was 41%  ?  ? ?07/11/2021 Follow up: Moderate COPD, Lung Cancer hx ?Patient returns for a 1 year follow-up.  Patient has underlying  moderate COPD.  Is on Anoro daily. ?Overall says his breathing is doing well.  He denies any increased cough or wheezing.  No change in activity tolerance. Does get short of breath with heavy activity . No recent steroids or antibiotics. Influenza , Covid and Prevnar  up to date.  Says has had Pneumovax.  ?Works part-time at [employer]. He walks a lot at work . Starting a low carb diet this week.  ?Overall says he feels well. No chest pain , orthopnea, edema .  ?Uses ProAir inhaler on occasion.  Denies any increased recent use.  Patient says he is going to check with his formulary as insurance does not cover ProAir very well. ? ?Patient has a history of lung cancer diagnosed in 2019.  Underwent concurrent chemotherapy and radiation.  Followed by immunotherapy which was completed in May 2021.  Serial CT follow-up has shown no evidence of recurrent or metastatic disease.  Last CT chest was in March 24, 2021. ?Has planned CT chest in June 2023 with Oncology .  ? ?  ? ? ? ?No Known Allergies ? ?Immunization History  ?Administered Date(s) Administered  ? Influenza,inj,Quad PF,6+ Mos 05/24/2016, 01/27/2019  ? Influenza,inj,quad, With Preservative 02/25/2018  ? Influenza-Unspecified 02/26/2017, 02/26/2018  ? PFIZER(Purple Top)SARS-COV-2 Vaccination 06/05/2019, 07/01/2019, 04/26/2020  ? Pneumococcal Conjugate-13 07/06/2020  ? Tdap 06/05/2014  ? Zoster, Live 05/25/2010  ? ? ?Past Medical History:  ?Diagnosis Date  ? BPH (benign prostatic hyperplasia)   ? CAD (coronary artery disease)   ? a. 04/2014 anterolateral STEMI/PCI: LM nl, LAD 65m (2.75x16 Promus DES, D1 50p, LCX nl, RI nl, OM1 100 small/chronic, RCA dominant, 20-67m, 83m/d (FFR 0.90->Med Rx), RPDA 50, RPLA 70, EF 60%.  ? COPD (chronic obstructive pulmonary disease) ([place])   ? Dr Raul Del  ?  Former tobacco use   ? Gastroesophageal reflux disease   ? HTN (hypertension)   ? Hyperlipidemia   ? Hypokalemia   ? Malignant neoplasm of lung ([place]) 04/01/2018  ?  Myocardial infarction ([place]) 04/2014  ? Dr Sherren Mocha  ? Stented coronary artery   ? ? ?Tobacco History: ?Social History  ? ?Tobacco Use  ?Smoking Status Former  ? Packs/day: 1.00  ? Years: 35.00  ? Pack years: 35.00  ? Types: Cigarettes  ? Quit date: 02/14/2011  ? Years since quitting: 10.4  ?Smokeless Tobacco Never  ? ?Counseling given: Not Answered ? ? ?Outpatient Medications Prior to Visit  ?Medication Sig Dispense Refill  ? acetaminophen (TYLENOL) 500 MG tablet Take 1,000 mg by mouth every 6 (six) hours as needed (for pain.).    ? albuterol (PROAIR HFA) 108 (90 Base) MCG/ACT inhaler Inhale 2 puffs into the lungs every 6 (six) hours as needed for wheezing or shortness of breath. 54 g 0  ? aspirin EC 81 MG tablet Take 81 mg by mouth daily.    ? atorvastatin (LIPITOR) 80 MG tablet TAKE 1 TABLET DAILY AT 6 P.M. 90 tablet 3  ? carvedilol (COREG) 6.25 MG tablet Take 1 tablet (6.25 mg total) by mouth 2 (two) times daily with a meal. 180 tablet 1  ? famotidine (PEPCID) 40 MG tablet Take by mouth.    ? lidocaine-prilocaine (EMLA) cream Apply small amount to port site 1 hour prior to [facility] being accessed and cover with plastic wrap 30 g 1  ? nitroGLYCERIN (NITROSTAT) 0.4 MG SL tablet Place 1 tablet (0.4 mg total) under the tongue every 5 (five) minutes as needed for chest pain. X 3 doses 25 tablet 3  ? omeprazole (PRILOSEC) 40 MG capsule Take 1 capsule (40 mg total) by mouth 2 (two) times daily. 180 capsule 1  ? tamsulosin (FLOMAX) 0.4 MG CAPS capsule Take 0.4 mg by mouth daily after supper.    ? umeclidinium-vilanterol (ANORO ELLIPTA) 62.5-25 MCG/INH AEPB Inhale 1 puff into the lungs daily. 3 each 4  ? finasteride (PROSCAR) 5 MG tablet Take 5 mg by mouth daily.    ? ondansetron (ZOFRAN) 8 MG tablet     ? Molnupiravir 200 MG CAPS Take by mouth.    ? ?No facility-administered medications prior to visit.  ? ? ? ?Review of Systems:  ? ?Constitutional:   No  weight loss, night sweats,  Fevers, chills,  ?+fatigue, or   lassitude. ? ?HEENT:   No headaches,  Difficulty swallowing,  Tooth/dental problems, or  Sore throat,  ?              No sneezing, itching, ear ache, nasal congestion, post nasal drip,  ? ?CV:  No chest pain,  Orthopnea, PND, swelling in lower extremities, anasarca, dizziness, palpitations, syncope.  ? ?GI  No heartburn, indigestion, abdominal pain, nausea, vomiting, diarrhea, change in bowel habits, loss of appetite, bloody stools.  ? ?Resp:   No chest wall deformity ? ?Skin: no rash or lesions. ? ?GU: no dysuria, change in color of urine, no urgency or frequency.  No flank pain, no hematuria  ? ?MS:  No joint pain or swelling.  No decreased range of motion.  No back pain. ? ? ? ?Physical Exam ? ?BP 118/80 (BP Location: Left Arm, Patient Position: Sitting, Cuff Size: Normal)   Pulse 83   Temp (!) 97.3 ?F (36.3 ?C) (Oral)   Ht 5\' 8"  (1.727 m)   Wt 165 lb 9.6  oz (75.1 kg)   SpO2 96%   BMI 25.18 kg/m?  ? ?GEN: A/Ox3; pleasant , NAD, well nourished  ?  ?HEENT:  [place]/AT,  NOSE-clear, THROAT-clear, no lesions, no postnasal drip or exudate noted.  ? ?NECK:  Supple w/ fair ROM; no JVD; normal carotid impulses w/o bruits; no thyromegaly or nodules palpated; no lymphadenopathy.   ? ?RESP  Clear  P & A; w/o, wheezes/ rales/ or rhonchi. no accessory muscle use, no dullness to percussion ? ?CARD:  RRR, no m/r/g, no peripheral edema, pulses intact, no cyanosis or clubbing. ? ?GI:   Soft & nt; nml bowel sounds; no organomegaly or masses detected.  ? ?Musco: Warm bil, no deformities or joint swelling noted.  ? ?Neuro: alert, no focal deficits noted.   ? ?Skin: Warm, no lesions or rashes ? ? ? ?Lab Results: ? ?CBC ? ? ? ? ?BNP ? ? ?ProBNP ?No results found for: PROBNP ? ?Imaging: ?No results found. ? ? ?No flowsheet data found. ? ?No results found for: NITRICOXIDE ? ? ? ? ? ?Assessment & Plan:  ? ?Chronic obstructive pulmonary disease (HCC) ?Moderate COPD compensated on present regimen. ?Immunizations appear  up-to-date ?Encouraged on activity as tolerated ? ?Plan  ?Patient Instructions  ?Continue on ANORO 1 puff daily , rinse after use.  ?Continue activity as tolerated.  ?High protein diet.  ?Follow up with Oncology as planned  ?Follow up with

## 2021-07-11 NOTE — Assessment & Plan Note (Signed)
Serial CT chest shows no evidence of metastatic or recurrent disease.  Continue follow-up with oncology and planned serial CT in June 2023 ?

## 2021-07-11 NOTE — Patient Instructions (Signed)
Continue on ANORO 1 puff daily , rinse after use.  ?Continue activity as tolerated.  ?High protein diet.  ?Follow up with Oncology as planned  ?Follow up with Dr. Mortimer Fries and Emonee Winkowski NP in 1 year and As needed   ? ?

## 2021-07-11 NOTE — Assessment & Plan Note (Signed)
Moderate COPD compensated on present regimen. ?Immunizations appear up-to-date ?Encouraged on activity as tolerated ? ?Plan  ?Patient Instructions  ?Continue on ANORO 1 puff daily , rinse after use.  ?Continue activity as tolerated.  ?High protein diet.  ?Follow up with Oncology as planned  ?Follow up with Dr. Mortimer Fries and Ninetta Adelstein NP in 1 year and As needed   ? ?  ? ?

## 2021-07-12 ENCOUNTER — Inpatient Hospital Stay: Payer: Medicare Other | Attending: Oncology

## 2021-07-12 ENCOUNTER — Inpatient Hospital Stay: Payer: Medicare Other

## 2021-07-12 DIAGNOSIS — Z85118 Personal history of other malignant neoplasm of bronchus and lung: Secondary | ICD-10-CM | POA: Insufficient documentation

## 2021-07-12 DIAGNOSIS — Z95828 Presence of other vascular implants and grafts: Secondary | ICD-10-CM

## 2021-07-12 DIAGNOSIS — Z452 Encounter for adjustment and management of vascular access device: Secondary | ICD-10-CM | POA: Diagnosis not present

## 2021-07-12 MED ORDER — HEPARIN SOD (PORK) LOCK FLUSH 100 UNIT/ML IV SOLN
500.0000 [IU] | Freq: Once | INTRAVENOUS | Status: AC
  Administered 2021-07-12: 500 [IU] via INTRAVENOUS
  Filled 2021-07-12: qty 5

## 2021-07-12 MED ORDER — SODIUM CHLORIDE 0.9% FLUSH
10.0000 mL | Freq: Once | INTRAVENOUS | Status: AC
  Administered 2021-07-12: 10 mL via INTRAVENOUS
  Filled 2021-07-12: qty 10

## 2021-07-18 ENCOUNTER — Encounter: Payer: Self-pay | Admitting: Radiation Oncology

## 2021-07-18 ENCOUNTER — Ambulatory Visit
Admission: RE | Admit: 2021-07-18 | Discharge: 2021-07-18 | Disposition: A | Discharge status: Home / Self Care | Payer: Medicare Other | Source: Ambulatory Visit | Attending: Radiation Oncology | Admitting: Radiation Oncology

## 2021-07-18 ENCOUNTER — Other Ambulatory Visit: Payer: Self-pay

## 2021-07-18 VITALS — BP 128/78 | HR 66 | Temp 95.9°F | Resp 16 | Ht 68.0 in | Wt 162.2 lb

## 2021-07-18 DIAGNOSIS — J449 Chronic obstructive pulmonary disease, unspecified: Secondary | ICD-10-CM | POA: Insufficient documentation

## 2021-07-18 DIAGNOSIS — Z923 Personal history of irradiation: Secondary | ICD-10-CM | POA: Insufficient documentation

## 2021-07-18 DIAGNOSIS — Z85118 Personal history of other malignant neoplasm of bronchus and lung: Secondary | ICD-10-CM | POA: Insufficient documentation

## 2021-07-18 DIAGNOSIS — C3431 Malignant neoplasm of lower lobe, right bronchus or lung: Secondary | ICD-10-CM

## 2021-07-18 NOTE — Progress Notes (Signed)
Radiation Oncology ?Follow up Note ? ?Name: Timothy Yoder   ?Date:   07/18/2021 ?MRN:  [id number] ?DOB: 1955/08/30  ? ? ?This 66 y.o. male presents to the clinic today for 3-year follow-up status post concurrent chemoradiation therapy for stage IIIa non-small cell lung cancer of the right lower lobe. ? ?REFERRING PROVIDER: Baxter Hire, MD ? ?HPI: Patient is a 66 year old male now at 3 years having received concurrent chemo or radiation for stage IIIa non-small cell lung cancer right lower lobe seen today in routine follow-up he is doing well.  He specifically denies any change in pulmonary status cough hemoptysis or chest tightness..  He had a CT scan back in December which I reviewed showed posttreatment changes in the right lower lobe no evidence for recurrent or metastatic disease.  He is currently on albuterol inhaler for his COPD. ? ?COMPLICATIONS OF TREATMENT: none ? ?FOLLOW UP COMPLIANCE: keeps appointments  ? ?PHYSICAL EXAM:  ?BP 128/78 (BP Location: Left Arm, Patient Position: Sitting)   Pulse 66   Temp (!) 95.9 ?F (35.5 ?C) (Tympanic)   Resp 16   Ht 5\' 8"  (1.727 m)   Wt 162 lb 3.2 oz (73.6 kg)   BMI 24.66 kg/m?  ?Well-developed well-nourished patient in NAD. HEENT reveals PERLA, EOMI, discs not visualized.  Oral cavity is clear. No oral mucosal lesions are identified. Neck is clear without evidence of cervical or supraclavicular adenopathy. Lungs are clear to A&P. Cardiac examination is essentially unremarkable with regular rate and rhythm without murmur rub or thrill. Abdomen is benign with no organomegaly or masses noted. Motor sensory and DTR levels are equal and symmetric in the upper and lower extremities. Cranial nerves II through XII are grossly intact. Proprioception is intact. No peripheral adenopathy or edema is identified. No motor or sensory levels are noted. Crude visual fields are within normal range. ? ?RADIOLOGY RESULTS: CT scan reviewed compatible with above-stated  findings ? ?PLAN: Present time patient is doing well with no evidence of disease 3 years out and pleased with his overall progress.  I will see him back 1 more time in a year.  I will then turn follow-up care over to medical oncology.  Patient knows to call with any concerns. ? ?I would like to take this opportunity to thank you for allowing me to participate in the care of your patient.. ?  ? Noreene Filbert, MD ? ?

## 2021-08-15 ENCOUNTER — Telehealth: Payer: Self-pay | Admitting: *Deleted

## 2021-08-15 NOTE — Telephone Encounter (Signed)
Patient left message wanting to reschedule his appointments ?

## 2021-09-24 ENCOUNTER — Other Ambulatory Visit: Payer: Self-pay | Admitting: Pulmonary Disease

## 2021-09-26 ENCOUNTER — Ambulatory Visit
Admission: RE | Admit: 2021-09-26 | Discharge: 2021-09-26 | Disposition: A | Discharge status: Home / Self Care | Payer: Medicare Other | Source: Ambulatory Visit | Attending: Oncology | Admitting: Oncology

## 2021-09-26 ENCOUNTER — Inpatient Hospital Stay: Payer: Medicare Other | Attending: Oncology

## 2021-09-26 ENCOUNTER — Other Ambulatory Visit: Payer: Medicare Other

## 2021-09-26 DIAGNOSIS — Z08 Encounter for follow-up examination after completed treatment for malignant neoplasm: Secondary | ICD-10-CM | POA: Insufficient documentation

## 2021-09-26 DIAGNOSIS — Z452 Encounter for adjustment and management of vascular access device: Secondary | ICD-10-CM | POA: Insufficient documentation

## 2021-09-26 DIAGNOSIS — R748 Abnormal levels of other serum enzymes: Secondary | ICD-10-CM | POA: Diagnosis not present

## 2021-09-26 DIAGNOSIS — C3431 Malignant neoplasm of lower lobe, right bronchus or lung: Secondary | ICD-10-CM

## 2021-09-26 DIAGNOSIS — Z95828 Presence of other vascular implants and grafts: Secondary | ICD-10-CM

## 2021-09-26 DIAGNOSIS — Z85118 Personal history of other malignant neoplasm of bronchus and lung: Secondary | ICD-10-CM | POA: Insufficient documentation

## 2021-09-26 LAB — CBC WITH DIFFERENTIAL/PLATELET
Abs Immature Granulocytes: 0.03 10*3/uL (ref 0.00–0.07)
Basophils Absolute: 0 10*3/uL (ref 0.0–0.1)
Basophils Relative: 1 %
Eosinophils Absolute: 0.1 10*3/uL (ref 0.0–0.5)
Eosinophils Relative: 1 %
HCT: 45 % (ref 39.0–52.0)
Hemoglobin: 15 g/dL (ref 13.0–17.0)
Immature Granulocytes: 1 %
Lymphocytes Relative: 18 %
Lymphs Abs: 1.1 10*3/uL (ref 0.7–4.0)
MCH: 31.6 pg (ref 26.0–34.0)
MCHC: 33.3 g/dL (ref 30.0–36.0)
MCV: 94.7 fL (ref 80.0–100.0)
Monocytes Absolute: 0.6 10*3/uL (ref 0.1–1.0)
Monocytes Relative: 10 %
Neutro Abs: 4.3 10*3/uL (ref 1.7–7.7)
Neutrophils Relative %: 69 %
Platelets: 218 10*3/uL (ref 150–400)
RBC: 4.75 MIL/uL (ref 4.22–5.81)
RDW: 12.8 % (ref 11.5–15.5)
WBC: 6 10*3/uL (ref 4.0–10.5)
nRBC: 0 % (ref 0.0–0.2)

## 2021-09-26 LAB — COMPREHENSIVE METABOLIC PANEL
ALT: 19 U/L (ref 0–44)
AST: 18 U/L (ref 15–41)
Albumin: 3.8 g/dL (ref 3.5–5.0)
Alkaline Phosphatase: 115 U/L (ref 38–126)
Anion gap: 4 — ABNORMAL LOW (ref 5–15)
BUN: 13 mg/dL (ref 8–23)
CO2: 26 mmol/L (ref 22–32)
Calcium: 8.3 mg/dL — ABNORMAL LOW (ref 8.9–10.3)
Chloride: 105 mmol/L (ref 98–111)
Creatinine, Ser: 0.99 mg/dL (ref 0.61–1.24)
GFR, Estimated: >60 mL/min (ref >60)
Glucose, Bld: 91 mg/dL (ref 70–99)
Potassium: 3.8 mmol/L (ref 3.5–5.1)
Sodium: 135 mmol/L (ref 135–145)
Total Bilirubin: 0.7 mg/dL (ref 0.3–1.2)
Total Protein: 6.8 g/dL (ref 6.5–8.1)

## 2021-09-26 LAB — LACTATE DEHYDROGENASE: LDH: 118 U/L (ref 98–192)

## 2021-09-26 MED ORDER — HEPARIN SOD (PORK) LOCK FLUSH 100 UNIT/ML IV SOLN
500.0000 [IU] | Freq: Once | INTRAVENOUS | Status: AC
  Administered 2021-09-26: 500 [IU] via INTRAVENOUS

## 2021-09-26 MED ORDER — SODIUM CHLORIDE 0.9% FLUSH
10.0000 mL | INTRAVENOUS | Status: AC | PRN
  Administered 2021-09-26: 10 mL via INTRAVENOUS
  Filled 2021-09-26: qty 10

## 2021-09-26 MED ORDER — IOHEXOL 300 MG/ML  SOLN
100.0000 mL | Freq: Once | INTRAMUSCULAR | Status: AC | PRN
  Administered 2021-09-26: 100 mL via INTRAVENOUS

## 2021-09-26 MED ORDER — HEPARIN SOD (PORK) LOCK FLUSH 100 UNIT/ML IV SOLN
500.0000 [IU] | Freq: Once | INTRAVENOUS | Status: AC
  Filled 2021-09-26: qty 5

## 2021-10-03 ENCOUNTER — Encounter: Payer: Self-pay | Admitting: Oncology

## 2021-10-03 ENCOUNTER — Inpatient Hospital Stay (HOSPITAL_BASED_OUTPATIENT_CLINIC_OR_DEPARTMENT_OTHER): Payer: Medicare Other | Admitting: Oncology

## 2021-10-03 ENCOUNTER — Other Ambulatory Visit: Payer: Self-pay | Admitting: Nurse Practitioner

## 2021-10-03 VITALS — BP 127/77 | HR 79 | Temp 96.8°F | Ht 68.0 in | Wt 156.0 lb

## 2021-10-03 DIAGNOSIS — C3431 Malignant neoplasm of lower lobe, right bronchus or lung: Secondary | ICD-10-CM

## 2021-10-03 DIAGNOSIS — Z95828 Presence of other vascular implants and grafts: Secondary | ICD-10-CM | POA: Insufficient documentation

## 2021-10-03 DIAGNOSIS — Z08 Encounter for follow-up examination after completed treatment for malignant neoplasm: Secondary | ICD-10-CM | POA: Diagnosis not present

## 2021-10-03 NOTE — Assessment & Plan Note (Signed)
Recommend patient to take calcium supplementation, [phone] mg daily

## 2021-10-03 NOTE — Progress Notes (Signed)
Hematology/Oncology Progress note [phone] [phone]      Patient Care Team: Baxter Hire, MD as PCP - General (Internal Medicine) Sherren Mocha, MD as PCP - Cardiology (Cardiology) Telford Nab, RN as Registered Nurse Baxter Hire, MD as Consulting Physician (Internal Medicine) Noreene Filbert, MD as Referring Physician (Radiation Oncology) Earlie Server, MD as Consulting Physician (Oncology)  REFERRING PROVIDER: Dr.Fleming REASON FOR VISIT:  Follow-up for management of stage III non-small cell lung cancer   ASSESSMENT & PLAN:  Port-A-Cath in place Patient is about 3.5 years after he finished concurrent chemo therapy radiation We discussed about option of Mediport removal. Patient prefers to continue port flush every 8 weeks and we will revisit this topic at the next visit.  Malignant neoplasm of lower lobe of right lung [facility]) Clinically patient is doing very well Labs reviewed and discussed with CT images were independent reviewed and discussed with patient.  No signs of recurrence Recommend continue surveillance CT scan in 6 months.  Hypocalcemia Recommend patient to take calcium supplementation, [phone] mg daily  Orders Placed This Encounter  Procedures   CT CHEST ABDOMEN PELVIS W CONTRAST    Standing Status:   Future    Standing Expiration Date:   10/04/2022    Order Specific Question:   Preferred imaging location?    Answer:   [facility]    Order Specific Question:   Is Oral Contrast requested for this exam?    Answer:   Yes, Per Radiology protocol   CBC with Differential/Platelet    Standing Status:   Future    Standing Expiration Date:   10/04/2022   Comprehensive metabolic panel    Standing Status:   Future    Standing Expiration Date:   10/03/2022   Lactate dehydrogenase    Standing Status:   Future    Standing Expiration Date:   10/04/2022   Follow-up in 6 months after CT scan. All questions were answered. The  patient knows to call the clinic with any problems, questions or concerns.  Earlie Server, MD, PhD [facility] Hematology Oncology 10/03/2021    HISTORY OF PRESENTING ILLNESS:  Timothy Yoder is a  66 y.o.  male with PMH listed below who was referred to me for evaluation of lung mass.  Patient is a former smoker quit smoking 7 years ago.  40-pack-year. Patient chest lung cancer screening on 03/11/2018. CT scan showed 2.4 cm right lower lobe nodule with a new necrotic.  Subcarinal lymph node. PET scan showed hypermetabolic right lower pulmonary nodule, consistent with primary bronchogenic carcinoma.  Fluid density subcarinal structure demonstrate no significant hypermetabolism.  Although this could represent an incidental benign lesion such as a cyst given the interval development since 02/24/2017, necrotic adenopathy cannot be excluded and tissue sampling should be considered.  # underwent biopsy via bronchoscopy.  Subcarinal lymph node biopsy positive for non-small cell lung cancer, favoring poorly differentiated adenocarcinoma.  Stage IIIA T1c N2M0 #Mediport was placed by Dr. Genevive Bi  # #Elevated alkaline phosphatase. Patient has been evaluated by gastroenterology.  Liver biopsy showed mild sinusoidal dilation and congestion.  Minimal changes suggestive of hepatic process. Iron stain demonstrate minimal focal hepatocellular iron. I discussed with Dr.Wohl via secure chat about patient's pathology result.  No restriction of proceeding with immunotherapy.  Continue watch for the labs.   # Cancer Treatment 04/26/2018 -06/11/2018 Concurrent Chemotherapy [weekly Carbo and Taxol] with RT.  07/10/2018 maintenance Durvalumab. Finished immunotherapy in May 2021 Timothy Yoder  is a 66 y.o. male who has above history reviewed by me today presents for follow up visit for management of stage III non-small cell lung cancer.   Patient reports feeling well.  He has no new complaints.  Denies weight  loss, fever, chills, fatigue, night sweats.    Review of Systems  Constitutional:  Negative for appetite change, chills, diaphoresis, fatigue, fever and unexpected weight change.  HENT:   Negative for hearing loss, lump/mass, nosebleeds, sore throat and voice change.   Eyes:  Negative for eye problems and icterus.  Respiratory:  Negative for chest tightness, cough, hemoptysis, shortness of breath and wheezing.   Cardiovascular:  Negative for chest pain and leg swelling.  Gastrointestinal:  Negative for abdominal distention, abdominal pain, blood in stool, diarrhea, nausea and rectal pain.  Endocrine: Negative for hot flashes.  Genitourinary:  Negative for bladder incontinence, difficulty urinating, dysuria, frequency, hematuria and nocturia.   Musculoskeletal:  Negative for arthralgias, back pain, flank pain, gait problem and myalgias.  Skin:  Negative for itching and rash.  Neurological:  Negative for dizziness, gait problem, headaches, light-headedness, numbness and seizures.  Hematological:  Negative for adenopathy. Does not bruise/bleed easily.  Psychiatric/Behavioral:  Negative for confusion and decreased concentration. The patient is not nervous/anxious.    MEDICAL HISTORY:  Past Medical History:  Diagnosis Date   BPH (benign prostatic hyperplasia)    CAD (coronary artery disease)    a. 04/2014 anterolateral STEMI/PCI: LM nl, LAD 75m(2.75x16 Promus DES, D1 50p, LCX nl, RI nl, OM1 100 small/chronic, RCA dominant, 20-332m7043m(FFR 0.90->Med Rx), RPDA 50, RPLA 70, EF 60%.   COPD (chronic obstructive pulmonary disease) (HCC)    Dr FleRaul DelFormer tobacco use    Gastroesophageal reflux disease    HTN (hypertension)    Hyperlipidemia    Hypokalemia    [place] of [place]   [place] ([place]   Dr MicSherren MochaStented coronary artery     SURGICAL HISTORY: Past Surgical History:  Procedure Laterality Date   CARDIAC CATHETERIZATION      CHOLECYSTECTOMY N/A 02/15/2016   Procedure: LAPAROSCOPIC CHOLECYSTECTOMY WITH INTRAOPERATIVE CHOLANGIOGRAM;  Surgeon: JarLeonie GreenD;  Location: [facility] ORS;  Service: General;  Laterality: N/A;   COLONOSCOPY     COLONOSCOPY WITH PROPOFOL N/A 08/02/2015   Procedure: COLONOSCOPY WITH PROPOFOL;  Surgeon: RobManya SilvasD;  [facility];  Service: Endoscopy;  Laterality: N/A;   COLONOSCOPY WITH PROPOFOL N/A 11/29/2020   Procedure: COLONOSCOPY WITH PROPOFOL;  Surgeon: LocLesly RubensteinD;  Location: [facility] ENDOSCOPY;  Service: Endoscopy;  Laterality: N/A;   CORONARY ANGIOPLASTY  04/2014   STENT   ENDOBRONCHIAL ULTRASOUND N/A 03/26/2018   Procedure: ENDOBRONCHIAL ULTRASOUND;  Surgeon: KasFlora LippsD;  Location: [facility] ORS;  Service: Cardiopulmonary;  Laterality: N/A;   ESOPHAGOGASTRODUODENOSCOPY (EGD) WITH PROPOFOL N/A 08/02/2015   Procedure: ESOPHAGOGASTRODUODENOSCOPY (EGD) WITH PROPOFOL;  Surgeon: RobManya SilvasD;  [facility];  Service: Endoscopy;  Laterality: N/A;   ESOPHAGOGASTRODUODENOSCOPY (EGD) WITH PROPOFOL N/A 11/29/2020   Procedure: ESOPHAGOGASTRODUODENOSCOPY (EGD) WITH PROPOFOL;  Surgeon: LocLesly RubensteinD;  Location: [facility] ENDOSCOPY;  Service: Endoscopy;  Laterality: N/A;   FLEXIBLE BRONCHOSCOPY N/A 03/26/2018   Procedure: FLEXIBLE BRONCHOSCOPY;  Surgeon: KasFlora LippsD;  Location: [facility] ORS;  Service: Cardiopulmonary;  Laterality: N/A;   KNEE ARTHROSCOPY Left    LEFT HEART CATHETERIZATION WITH CORONARY ANGIOGRAM N/A 05/03/2014   Procedure: LEFT HEART CATHETERIZATION WITH CORONARY ANGIOGRAM;  Surgeon: Blane Ohara, MD;  Location: [facility] CATH LAB;  Service: Cardiovascular;  Laterality: N/A;   PERCUTANEOUS CORONARY STENT INTERVENTION (PCI-S) N/A 05/05/2014   Procedure: PERCUTANEOUS CORONARY STENT INTERVENTION (PCI-S);  Surgeon: Peter M Martinique, MD;  Location: [facility] CATH LAB;  Service: Cardiovascular;  Laterality: N/A;   PORTACATH PLACEMENT Left  04/04/2018   Procedure: INSERTION PORT-A-CATH;  Surgeon: [name], MD;  Location: [facility] ORS;  Service: General;  Laterality: Left;   UPPER GI ENDOSCOPY      SOCIAL HISTORY: Social History   Socioeconomic History   Marital status: Married    Spouse name: Not on file   Number of children: Not on file   Years of education: Not on file   Highest education level: Not on file  Occupational History   Not on file  Tobacco Use   Smoking status: Former    Packs/day: 1.00    Years: 35.00    Total pack years: 35.00    Types: Cigarettes    Quit date: 02/14/2011    Years since quitting: 10.6   Smokeless tobacco: Never  Vaping Use   Vaping Use: Never used  Substance and Sexual Activity   Alcohol use: No   Drug use: No   Sexual activity: Not on file  Other Topics Concern   Not on file  Social History Narrative   The patient is married. He lives in [place]. He works full-time. He quit smoking in 2013.   Social Determinants of Health   Financial Resource Strain: Not on file  Food Insecurity: Not on file  Transportation Needs: Not on file  Physical Activity: Not on file  Stress: Not on file  Social Connections: Not on file  Intimate Partner Violence: Not on file    FAMILY HISTORY: Family History  Problem Relation Age of Onset   CAD Mother 31       Died of an MI   CAD Father 48       Died of a massive MI   COPD Sister    Lung cancer Brother    Bone cancer Paternal Uncle    Colon cancer Neg Hx    Breast cancer Neg Hx     ALLERGIES:  has No Known Allergies.  MEDICATIONS:  Current Outpatient Medications  Medication Sig Dispense Refill   acetaminophen (TYLENOL) 500 MG tablet Take 1,000 mg by mouth every 6 (six) hours as needed (for pain.).     albuterol (PROAIR HFA) 108 (90 Base) MCG/ACT inhaler Inhale 2 puffs into the lungs every 6 (six) hours as needed for wheezing or shortness of breath. 54 g 0   ANORO ELLIPTA 62.5-25 MCG/ACT AEPB USE 1 INHALATION DAILY 180 each  3   aspirin EC 81 MG tablet Take 81 mg by mouth daily.     atorvastatin (LIPITOR) 80 MG tablet TAKE 1 TABLET DAILY AT 6 P.M. 90 tablet 3   carvedilol (COREG) 6.25 MG tablet Take 1 tablet (6.25 mg total) by mouth 2 (two) times daily with a meal. 180 tablet 1   lidocaine-prilocaine (EMLA) cream Apply small amount to port site 1 hour prior to [facility] being accessed and cover with plastic wrap 30 g 1   nitroGLYCERIN (NITROSTAT) 0.4 MG SL tablet Place 1 tablet (0.4 mg total) under the tongue every 5 (five) minutes as needed for chest pain. X 3 doses 25 tablet 3   omeprazole (PRILOSEC) 40 MG capsule Take 1 capsule (40 mg total) by mouth 2 (two) times daily. 180 capsule  1   tamsulosin (FLOMAX) 0.4 MG CAPS capsule Take 0.4 mg by mouth daily after supper.     famotidine (PEPCID) 40 MG tablet Take by mouth.     No current facility-administered medications for this visit.   Facility-Administered Medications Ordered in Other Visits  Medication Dose Route Frequency Provider Last Rate Last Admin   heparin lock flush 100 unit/mL  500 Units Intravenous Once Earlie Server, MD       sodium chloride flush (NS) 0.9 % injection 10 mL  10 mL Intravenous PRN Earlie Server, MD   10 mL at 09/26/21 1125     PHYSICAL EXAMINATION: ECOG PERFORMANCE STATUS: 1 - Symptomatic but completely ambulatory Vitals:   10/03/21 1256  BP: 127/77  Pulse: 79  Temp: (!) 96.8 F (36 C)   Filed Weights   10/03/21 1256  Weight: 156 lb (70.8 kg)    Physical Exam Constitutional:      General: He is not in acute distress. HENT:     Head: Normocephalic and atraumatic.  Eyes:     General: No scleral icterus. Cardiovascular:     Rate and Rhythm: Normal rate and regular rhythm.     Heart sounds: Normal heart sounds.  Pulmonary:     Effort: Pulmonary effort is normal. No respiratory distress.     Breath sounds: No wheezing.  Abdominal:     General: Bowel sounds are normal. There is no distension.     Palpations: Abdomen is soft.   Musculoskeletal:        General: No deformity. Normal range of motion.     Cervical back: Normal range of motion and neck supple.  Skin:    General: Skin is warm and dry.     Findings: No erythema or rash.  Neurological:     Mental Status: He is alert and oriented to person, place, and time. Mental status is at baseline.     Cranial Nerves: No cranial nerve deficit.     Coordination: Coordination normal.  Psychiatric:        Mood and Affect: Mood normal.      LABORATORY DATA:  I have reviewed the data as listed Lab Results  Component Value Date   WBC 6.0 09/26/2021   HGB 15.0 09/26/2021   HCT 45.0 09/26/2021   MCV 94.7 09/26/2021   PLT 218 09/26/2021   Recent Labs    03/22/21 1324 09/26/21 1121  NA 140 135  K 3.5 3.8  CL 105 105  CO2 26 26  GLUCOSE 118* 91  BUN 11 13  CREATININE 0.99 0.99  CALCIUM 8.6* 8.3*  GFRNONAA >60 >60  PROT 6.3* 6.8  ALBUMIN 3.8 3.8  AST 21 18  ALT 22 19  ALKPHOS 102 115  BILITOT 0.8 0.7    Iron/TIBC/Ferritin/ %Sat    Component Value Date/Time   IRON 41 (L) 11/21/2018 0856   TIBC 260 11/21/2018 0856   FERRITIN 150 11/21/2018 0856   IRONPCTSAT 16 (L) 11/21/2018 0856    RADIOGRAPHIC STUDIES: I have personally reviewed the radiological images as listed and agreed with the findings in the report.  CT CHEST ABDOMEN PELVIS W CONTRAST  Result Date: 09/26/2021 CLINICAL DATA:  Lung cancer restaging. EXAM: CT CHEST, ABDOMEN, AND PELVIS WITH CONTRAST TECHNIQUE: Multidetector CT imaging of the chest, abdomen and pelvis was performed following the standard protocol during bolus administration of intravenous contrast. RADIATION DOSE REDUCTION: This exam was performed according to the departmental dose-optimization program which includes automated exposure control, adjustment  of the mA and/or kV according to patient size and/or use of iterative reconstruction technique. CONTRAST:  167m OMNIPAQUE IOHEXOL 300 MG/ML  SOLN COMPARISON:  03/24/2021  FINDINGS: CT CHEST FINDINGS Cardiovascular: The heart size is normal. No substantial pericardial effusion. Coronary artery calcification is evident. Mild atherosclerotic calcification is noted in the wall of the thoracic aorta. Mediastinum/Nodes: No mediastinal lymphadenopathy. There is no hilar lymphadenopathy. The esophagus has normal imaging features. There is no axillary lymphadenopathy. Lungs/Pleura: Stable biapical pleuroparenchymal scarring. Centrilobular and paraseptal emphysema evident. Architectural distortion/scarring in the infrahilar right lower lobe is stable in the interval consistent with post treatment changes. Stable right lower lobe calcified granuloma. Calcified granuloma also noted left upper lobe. No new suspicious pulmonary nodule or mass on today's exam. No focal airspace consolidation. No pleural effusion. Musculoskeletal: No worrisome lytic or sclerotic osseous abnormality. CT ABDOMEN PELVIS FINDINGS Hepatobiliary: No suspicious focal abnormality within the liver parenchyma. Gallbladder surgically absent. No intrahepatic or extrahepatic biliary dilation. Pancreas: No focal mass lesion. No dilatation of the main duct. No intraparenchymal cyst. No peripancreatic edema. Spleen: No splenomegaly. No focal mass lesion. Adrenals/Urinary Tract: No adrenal nodule or mass. Kidneys unremarkable. No evidence for hydroureter. The urinary bladder appears normal for the degree of distention. Stomach/Bowel: Stomach is unremarkable. No gastric wall thickening. No evidence of outlet obstruction. Duodenum is normally positioned as is the ligament of Treitz. Duodenal diverticulum noted. No small bowel wall thickening. No small bowel dilatation. The terminal ileum is normal. The appendix is normal. No gross colonic mass. No colonic wall thickening. Diverticular changes are noted in the left colon without evidence of diverticulitis. Vascular/Lymphatic: There is mild atherosclerotic calcification of the abdominal  aorta without aneurysm. There is no gastrohepatic or hepatoduodenal ligament lymphadenopathy. No retroperitoneal or mesenteric lymphadenopathy. No pelvic sidewall lymphadenopathy. Reproductive: The prostate gland and seminal vesicles are unremarkable. Other: No intraperitoneal free fluid. Musculoskeletal: No worrisome lytic or sclerotic osseous abnormality. Bilateral pars interarticularis defects noted at L5. IMPRESSION: 1. Stable exam. No new or progressive findings in the chest, abdomen, or pelvis. Specifically, no findings to suggest recurrent disease. No evidence for metastatic disease in the chest, abdomen, or pelvis. 2. Stable post treatment changes in the infrahilar right lower lobe. 3. Left colonic diverticulosis without diverticulitis. 4. Aortic Atherosclerosis (ICD10-I70.0) and Emphysema (ICD10-J43.9). Electronically Signed   By: EMisty StanleyM.D.   On: 09/26/2021 16:57      COPD, continue albuterol inhaler  Follow up in 6 months.  ZEarlie Server MD, PhD 10/03/21

## 2021-10-03 NOTE — Assessment & Plan Note (Signed)
Patient is about 3.5 years after he finished concurrent chemo therapy radiation We discussed about option of Mediport removal. Patient prefers to continue port flush every 8 weeks and we will revisit this topic at the next visit.

## 2021-10-03 NOTE — Assessment & Plan Note (Signed)
Clinically patient is doing very well Labs reviewed and discussed with CT images were independent reviewed and discussed with patient.  No signs of recurrence Recommend continue surveillance CT scan in 6 months.

## 2021-10-03 NOTE — Progress Notes (Signed)
Survivorship Care Plan visit completed.  Treatment summary reviewed and given to patient.  ASCO answers booklet reviewed and given to patient.  CARE program and Cancer Transitions discussed with patient along with other resources cancer center offers to patients and caregivers.  Patient verbalized understanding.    

## 2021-10-27 ENCOUNTER — Other Ambulatory Visit: Payer: Self-pay | Admitting: Cardiovascular Disease

## 2021-11-28 ENCOUNTER — Inpatient Hospital Stay: Payer: Medicare Other | Attending: Oncology

## 2021-11-28 DIAGNOSIS — Z85118 Personal history of other malignant neoplasm of bronchus and lung: Secondary | ICD-10-CM | POA: Diagnosis not present

## 2021-11-28 DIAGNOSIS — Z452 Encounter for adjustment and management of vascular access device: Secondary | ICD-10-CM | POA: Insufficient documentation

## 2021-11-28 DIAGNOSIS — Z95828 Presence of other vascular implants and grafts: Secondary | ICD-10-CM

## 2021-11-28 MED ORDER — SODIUM CHLORIDE 0.9% FLUSH
10.0000 mL | INTRAVENOUS | Status: DC | PRN
  Administered 2021-11-28: 10 mL via INTRAVENOUS
  Filled 2021-11-28: qty 10

## 2021-11-28 MED ORDER — HEPARIN SOD (PORK) LOCK FLUSH 100 UNIT/ML IV SOLN
500.0000 [IU] | Freq: Once | INTRAVENOUS | Status: AC
  Administered 2021-11-28: 500 [IU] via INTRAVENOUS
  Filled 2021-11-28: qty 5

## 2021-11-29 ENCOUNTER — Ambulatory Visit (INDEPENDENT_AMBULATORY_CARE_PROVIDER_SITE_OTHER): Payer: Medicare Other | Admitting: Cardiovascular Disease

## 2021-11-29 ENCOUNTER — Encounter: Payer: Self-pay | Admitting: Cardiovascular Disease

## 2021-11-29 VITALS — BP 134/84 | HR 67 | Ht 68.0 in | Wt 160.0 lb

## 2021-11-29 DIAGNOSIS — I10 Essential (primary) hypertension: Secondary | ICD-10-CM | POA: Diagnosis not present

## 2021-11-29 DIAGNOSIS — E782 Mixed hyperlipidemia: Secondary | ICD-10-CM | POA: Diagnosis not present

## 2021-11-29 DIAGNOSIS — I2510 Atherosclerotic heart disease of native coronary artery without angina pectoris: Secondary | ICD-10-CM | POA: Diagnosis not present

## 2021-11-29 NOTE — Patient Instructions (Signed)
Medication Instructions:  Your physician recommends that you continue on your current medications as directed. Please refer to the Current Medication list given to you today.  *If you need a refill on your cardiac medications before your next appointment, please call your pharmacy*   Lab Work: NONE If you have labs (blood work) drawn today and your tests are completely normal, you will receive your results only by: [place] (if you have MyChart) OR A paper copy in the mail If you have any lab test that is abnormal or we need to change your treatment, we will call you to review the results.   Testing/Procedures: NONE   Follow-Up: At [facility], you and your health needs are our priority.  As part of our continuing mission to provide you with exceptional heart care, we have created designated Provider Care Teams.  These Care Teams include your primary Cardiologist (physician) and Advanced Practice Providers (APPs -  Physician Assistants and Nurse Practitioners) who all work together to provide you with the care you need, when you need it.  We recommend signing up for the patient portal called "MyChart".  Sign up information is provided on this After Visit Summary.  MyChart is used to connect with patients for Virtual Visits (Telemedicine).  Patients are able to view lab/test results, encounter notes, upcoming appointments, etc.  Non-urgent messages can be sent to your provider as well.   To learn more about what you can do with MyChart, go to [email or web address].    Your next appointment:   1 year(s)  The format for your next appointment:   In Person  Provider:   Sherren Mocha, MD      Important Information About Sugar

## 2021-11-29 NOTE — Progress Notes (Signed)
Cardiology Office Note:    Date:  11/29/2021   ID:  Timothy Yoder, DOB 1955-10-01, MRN [id number]  PCP:  Timothy Hire, MD   [place] [facility] Cardiologist:  Sherren Mocha, MD     Referring MD: Timothy Hire, MD   Chief Complaint  Patient presents with   Coronary Artery Disease    History of Present Illness:    Timothy Yoder is a 66 y.o. male with a hx of coronary artery disease, initially presenting with an anterolateral STEMI in 2016 treated with a drug-eluting stent to the mid LAD.  LVEF at that time was 60%. He was noted to have moderate stenosis of the right coronary artery evaluated by pressure wire with recommendations for medical management.  The patient has non-small cell lung cancer and he is followed closely by his oncologist. He has completed chemotherapy and radiation therapy, now in surveillance program with serial CT studies.  I have reviewed his recent oncology notes and there has been no evidence of recurrence.  He is felt to be doing well from that perspective.  The patient is here alone today.  He is doing well from a cardiac standpoint.  He reports no recent episodes of chest discomfort or any exertional related chest pain or pressure.  He does have exertional dyspnea which has been present for the past few years and is attributed to his lung disease with history of chemotherapy and radiation.  An echocardiogram was done a few years ago and showed normal LVEF and normal diastolic parameters with no significant valvular disease.  The patient denies orthopnea, PND, or heart palpitations.  He is not short of breath with his activities of daily living, only with higher levels of exertion.  Past Medical History:  Diagnosis Date   BPH (benign prostatic hyperplasia)    CAD (coronary artery disease)    a. 04/2014 anterolateral STEMI/PCI: LM nl, LAD 8m(2.75x16 Promus DES, D1 50p, LCX nl, RI nl, OM1 100 small/chronic, RCA dominant, 20-332m705m(FFR  0.90->Med Rx), RPDA 50, RPLA 70, EF 60%.   COPD (chronic obstructive pulmonary disease) (HCC)    Dr FleRaul DelFormer tobacco use    Gastroesophageal reflux disease    HTN (hypertension)    Hyperlipidemia    Hypokalemia    Malignant [place]   [place]   Dr MicSherren MochaStented coronary artery     Past Surgical History:  Procedure Laterality Date   CARDIAC CATHETERIZATION     CHOLECYSTECTOMY N/A 02/15/2016   Procedure: LAPAROSCOPIC CHOLECYSTECTOMY WITH INTRAOPERATIVE CHOLANGIOGRAM;  Surgeon: JarLeonie GreenD;  Location: [facility] ORS;  Service: General;  Laterality: N/A;   COLONOSCOPY     COLONOSCOPY WITH PROPOFOL N/A 08/02/2015   Procedure: COLONOSCOPY WITH PROPOFOL;  Surgeon: RobManya SilvasD;  [facility];  Service: Endoscopy;  Laterality: N/A;   COLONOSCOPY WITH PROPOFOL N/A 11/29/2020   Procedure: COLONOSCOPY WITH PROPOFOL;  Surgeon: LocLesly RubensteinD;  Location: [facility] ENDOSCOPY;  Service: Endoscopy;  Laterality: N/A;   CORONARY ANGIOPLASTY  04/2014   STENT   ENDOBRONCHIAL ULTRASOUND N/A 03/26/2018   Procedure: ENDOBRONCHIAL ULTRASOUND;  Surgeon: KasFlora LippsD;  Location: [facility] ORS;  Service: Cardiopulmonary;  Laterality: N/A;   ESOPHAGOGASTRODUODENOSCOPY (EGD) WITH PROPOFOL N/A 08/02/2015   Procedure: ESOPHAGOGASTRODUODENOSCOPY (EGD) WITH PROPOFOL;  Surgeon: RobManya SilvasD;  [facility];  Service: Endoscopy;  Laterality: N/A;   ESOPHAGOGASTRODUODENOSCOPY (EGD) WITH PROPOFOL  N/A 11/29/2020   Procedure: ESOPHAGOGASTRODUODENOSCOPY (EGD) WITH PROPOFOL;  Surgeon: Lesly Rubenstein, MD;  Location: [facility] ENDOSCOPY;  Service: Endoscopy;  Laterality: N/A;   FLEXIBLE BRONCHOSCOPY N/A 03/26/2018   Procedure: FLEXIBLE BRONCHOSCOPY;  Surgeon: Flora Lipps, MD;  Location: [facility] ORS;  Service: Cardiopulmonary;  Laterality: N/A;   KNEE ARTHROSCOPY Left    LEFT HEART CATHETERIZATION WITH CORONARY  ANGIOGRAM N/A 05/03/2014   Procedure: LEFT HEART CATHETERIZATION WITH CORONARY ANGIOGRAM;  Surgeon: Blane Ohara, MD;  Location: [facility] CATH LAB;  Service: Cardiovascular;  Laterality: N/A;   PERCUTANEOUS CORONARY STENT INTERVENTION (PCI-S) N/A 05/05/2014   Procedure: PERCUTANEOUS CORONARY STENT INTERVENTION (PCI-S);  Surgeon: Peter M Martinique, MD;  Location[facility] CATH LAB;  Service: Cardiovascular;  Laterality: N/A;   PORTACATH PLACEMENT Left 04/04/2018   Procedure: INSERTION PORT-A-CATH;  Surgeon: [name], MD;  Location: [facility] ORS;  Service: General;  Laterality: Left;   UPPER GI ENDOSCOPY      Current Medications: Current Meds  Medication Sig   acetaminophen (TYLENOL) 500 MG tablet Take 1,000 mg by mouth every 6 (six) hours as needed (for pain.).   albuterol (PROAIR HFA) 108 (90 Base) MCG/ACT inhaler Inhale 2 puffs into the lungs every 6 (six) hours as needed for wheezing or shortness of breath.   ANORO ELLIPTA 62.5-25 MCG/ACT AEPB USE 1 INHALATION DAILY   aspirin EC 81 MG tablet Take 81 mg by mouth daily.   atorvastatin (LIPITOR) 80 MG tablet TAKE 1 TABLET DAILY AT 6 P.M.   carvedilol (COREG) 6.25 MG tablet TAKE 1 TABLET TWICE A DAY WITH MEALS   famotidine (PEPCID) 40 MG tablet Take 40 mg by mouth daily.   lidocaine-prilocaine (EMLA) cream Apply small amount to port site 1 hour prior to [facility] being accessed and cover with plastic wrap   nitroGLYCERIN (NITROSTAT) 0.4 MG SL tablet Place 1 tablet (0.4 mg total) under the tongue every 5 (five) minutes as needed for chest pain. X 3 doses   omeprazole (PRILOSEC) 40 MG capsule Take 1 capsule (40 mg total) by mouth 2 (two) times daily.   tamsulosin (FLOMAX) 0.4 MG CAPS capsule Take 0.4 mg by mouth daily after supper.     Allergies:   Patient has no known allergies.   Social History   Socioeconomic History   Marital status: Married    Spouse name: Not on file   Number of children: Not on file   Years of education: Not on file   Highest  education level: Not on file  Occupational History   Not on file  Tobacco Use   Smoking status: Former    Packs/day: 1.00    Years: 35.00    Total pack years: 35.00    Types: Cigarettes    Quit date: 02/14/2011    Years since quitting: 10.7   Smokeless tobacco: Never  Vaping Use   Vaping Use: Never used  Substance and Sexual Activity   Alcohol use: No   Drug use: No   Sexual activity: Not on file  Other Topics Concern   Not on file  Social History Narrative   The patient is married. He lives in [place]. He works full-time. He quit smoking in 2013.   Social Determinants of Health   Financial Resource Strain: Not on file  Food Insecurity: Not on file  Transportation Needs: Not on file  Physical Activity: Not on file  Stress: Not on file  Social Connections: Not on file     Family History: The patient's family  history includes Bone cancer in his paternal uncle; CAD (age of onset: 54) in his father; CAD (age of onset: 36) in his mother; COPD in his sister; Lung cancer in his brother. There is no history of Colon cancer or Breast cancer.  ROS:   Please see the history of present illness.    All other systems reviewed and are negative.  EKGs/Labs/Other Studies Reviewed:    The following studies were reviewed today: Echo 09/01/2019:  1. Left ventricular ejection fraction, by estimation, is 55 to 60%. The  left ventricle has normal function. The left ventricle has no regional  wall motion abnormalities. Left ventricular diastolic parameters were  normal.   2. Right ventricular systolic function is normal. The right ventricular  size is normal.   3. The mitral valve is normal in structure. Trivial mitral valve  regurgitation. No evidence of mitral stenosis.   4. The aortic valve is normal in structure. Aortic valve regurgitation is  trivial. No aortic stenosis is present.   5. The inferior vena cava is normal in size with greater than 50%  respiratory variability,  suggesting right atrial pressure of 3 mmHg.  EKG:  EKG is ordered today.  The ekg ordered today demonstrates normal sinus rhythm 64 bpm, age-indeterminate inferior infarct, no significant change from prior.  Recent Labs: 09/26/2021: ALT 19; BUN 13; Creatinine, Ser 0.99; Hemoglobin 15.0; Platelets 218; Potassium 3.8; Sodium 135  Recent Lipid Panel    Component Value Date/Time   CHOL 124 08/30/2020 0954   TRIG 95 08/30/2020 0954   HDL 31 (L) 08/30/2020 0954   CHOLHDL 4.0 08/30/2020 0954   CHOLHDL 3 07/31/2014 0736   VLDL 15.2 07/31/2014 0736   LDLCALC 75 08/30/2020 0954     Risk Assessment/Calculations:           Physical Exam:    VS:  BP 134/84   Pulse 67   Ht _0  (1.727 m)   Wt 160 lb (72.6 kg)   SpO2 96%   BMI 24.33 kg/m     Wt Readings from Last 3 Encounters:  11/29/21 160 lb (72.6 kg)  10/03/21 156 lb (70.8 kg)  07/18/21 162 lb 3.2 oz (73.6 kg)     GEN:  Well nourished, well developed in no acute distress HEENT: Normal NECK: No JVD; No carotid bruits LYMPHATICS: No lymphadenopathy CARDIAC: RRR, no murmurs, rubs, gallops RESPIRATORY:  Clear to auscultation without rales, wheezing or rhonchi  ABDOMEN: Soft, non-tender, non-distended MUSCULOSKELETAL:  No edema; No deformity  SKIN: Warm and dry NEUROLOGIC:  Alert and oriented x 3 PSYCHIATRIC:  Normal affect   ASSESSMENT:    1. Coronary artery disease involving native coronary artery of native heart without angina pectoris   2. Essential hypertension   3. Mixed hyperlipidemia    PLAN:    In order of problems listed above:  The patient is doing well with no exertional angina on his current medical program.  He remains on aspirin for antiplatelet therapy, high intensity statin drug, and carvedilol.  LVEF is normal based on most recent echo which is reviewed as above.  No changes are made today. Blood pressure is well-controlled on carvedilol. Lipids reviewed through care everywhere.  LDL cholesterol last  year is 68 mg/dL, HDL is low at 29, total cholesterol 119, triglycerides 111.  Transaminases have been normal and were checked recently with his oncology assessment in May.  I have reviewed these labs today as well.  He will continue on atorvastatin at the  current dose.      Medication Adjustments/Labs and Tests Ordered: Current medicines are reviewed at length with the patient today.  Concerns regarding medicines are outlined above.  Orders Placed This Encounter  Procedures   EKG 12-Lead   No orders of the defined types were placed in this encounter.   Patient Instructions  Medication Instructions:  Your physician recommends that you continue on your current medications as directed. Please refer to the Current Medication list given to you today.  *If you need a refill on your cardiac medications before your next appointment, please call your pharmacy*   Lab Work: NONE If you have labs (blood work) drawn today and your tests are completely normal, you will receive your results only by: [place] (if you have MyChart) OR A paper copy in the mail If you have any lab test that is abnormal or we need to change your treatment, we will call you to review the results.   Testing/Procedures: NONE   Follow-Up: At [facility], you and your health needs are our priority.  As part of our continuing mission to provide you with exceptional heart care, we have created designated Provider Care Teams.  These Care Teams include your primary Cardiologist (physician) and Advanced Practice Providers (APPs -  Physician Assistants and Nurse Practitioners) who all work together to provide you with the care you need, when you need it.  We recommend signing up for the patient portal called "MyChart".  Sign up information is provided on this After Visit Summary.  MyChart is used to connect with patients for Virtual Visits (Telemedicine).  Patients are able to view lab/test results, encounter notes,  upcoming appointments, etc.  Non-urgent messages can be sent to your provider as well.   To learn more about what you can do with MyChart, go to [email or web address].    Your next appointment:   1 year(s)  The format for your next appointment:   In Person  Provider:   Sherren Mocha, MD      Important Information About Sugar         Signed, Sherren Mocha, MD  11/29/2021 10:31 AM    [place]

## 2021-12-19 ENCOUNTER — Ambulatory Visit: Payer: Medicare Other | Admitting: Cardiovascular Disease

## 2022-01-23 ENCOUNTER — Inpatient Hospital Stay: Payer: Medicare Other | Attending: Oncology

## 2022-01-23 DIAGNOSIS — Z452 Encounter for adjustment and management of vascular access device: Secondary | ICD-10-CM | POA: Diagnosis not present

## 2022-01-23 DIAGNOSIS — Z85118 Personal history of other malignant neoplasm of bronchus and lung: Secondary | ICD-10-CM | POA: Diagnosis not present

## 2022-01-23 DIAGNOSIS — Z95828 Presence of other vascular implants and grafts: Secondary | ICD-10-CM

## 2022-01-23 MED ORDER — SODIUM CHLORIDE 0.9% FLUSH
10.0000 mL | Freq: Once | INTRAVENOUS | Status: AC
  Administered 2022-01-23: 10 mL via INTRAVENOUS
  Filled 2022-01-23: qty 10

## 2022-01-23 MED ORDER — HEPARIN SOD (PORK) LOCK FLUSH 100 UNIT/ML IV SOLN
500.0000 [IU] | Freq: Once | INTRAVENOUS | Status: AC
  Administered 2022-01-23: 500 [IU] via INTRAVENOUS
  Filled 2022-01-23: qty 5

## 2022-03-15 ENCOUNTER — Other Ambulatory Visit: Payer: Self-pay | Admitting: Cardiovascular Disease

## 2022-03-20 ENCOUNTER — Inpatient Hospital Stay: Payer: Medicare Other

## 2022-03-20 DIAGNOSIS — Z85118 Personal history of other malignant neoplasm of bronchus and lung: Secondary | ICD-10-CM | POA: Insufficient documentation

## 2022-03-20 DIAGNOSIS — Z452 Encounter for adjustment and management of vascular access device: Secondary | ICD-10-CM | POA: Insufficient documentation

## 2022-03-20 MED ORDER — HEPARIN SOD (PORK) LOCK FLUSH 100 UNIT/ML IV SOLN
500.0000 [IU] | Freq: Once | INTRAVENOUS | Status: AC
  Administered 2022-03-20: 500 [IU] via INTRAVENOUS
  Filled 2022-03-20: qty 5

## 2022-03-20 MED ORDER — SODIUM CHLORIDE 0.9% FLUSH
10.0000 mL | Freq: Once | INTRAVENOUS | Status: AC
  Administered 2022-03-20: 10 mL via INTRAVENOUS
  Filled 2022-03-20: qty 10

## 2022-04-04 ENCOUNTER — Inpatient Hospital Stay: Payer: Medicare Other | Attending: Oncology

## 2022-04-04 ENCOUNTER — Ambulatory Visit
Admission: RE | Admit: 2022-04-04 | Discharge: 2022-04-04 | Disposition: A | Discharge status: Home / Self Care | Payer: Medicare Other | Source: Ambulatory Visit | Attending: Oncology | Admitting: Oncology

## 2022-04-04 DIAGNOSIS — Z08 Encounter for follow-up examination after completed treatment for malignant neoplasm: Secondary | ICD-10-CM | POA: Insufficient documentation

## 2022-04-04 DIAGNOSIS — C3431 Malignant neoplasm of lower lobe, right bronchus or lung: Secondary | ICD-10-CM

## 2022-04-04 DIAGNOSIS — Z452 Encounter for adjustment and management of vascular access device: Secondary | ICD-10-CM | POA: Insufficient documentation

## 2022-04-04 DIAGNOSIS — Z85118 Personal history of other malignant neoplasm of bronchus and lung: Secondary | ICD-10-CM | POA: Insufficient documentation

## 2022-04-04 DIAGNOSIS — J449 Chronic obstructive pulmonary disease, unspecified: Secondary | ICD-10-CM | POA: Diagnosis not present

## 2022-04-04 DIAGNOSIS — Z95828 Presence of other vascular implants and grafts: Secondary | ICD-10-CM

## 2022-04-04 LAB — COMPREHENSIVE METABOLIC PANEL
ALT: 18 U/L (ref 0–44)
AST: 19 U/L (ref 15–41)
Albumin: 3.8 g/dL (ref 3.5–5.0)
Alkaline Phosphatase: 106 U/L (ref 38–126)
Anion gap: 7 (ref 5–15)
BUN: 11 mg/dL (ref 8–23)
CO2: 25 mmol/L (ref 22–32)
Calcium: 8.4 mg/dL — ABNORMAL LOW (ref 8.9–10.3)
Chloride: 109 mmol/L (ref 98–111)
Creatinine, Ser: 1.04 mg/dL (ref 0.61–1.24)
GFR, Estimated: >60 mL/min (ref >60)
Glucose, Bld: 101 mg/dL — ABNORMAL HIGH (ref 70–99)
Potassium: 3.8 mmol/L (ref 3.5–5.1)
Sodium: 141 mmol/L (ref 135–145)
Total Bilirubin: 0.8 mg/dL (ref 0.3–1.2)
Total Protein: 6.7 g/dL (ref 6.5–8.1)

## 2022-04-04 LAB — CBC WITH DIFFERENTIAL/PLATELET
Abs Immature Granulocytes: 0.04 10*3/uL (ref 0.00–0.07)
Basophils Absolute: 0 10*3/uL (ref 0.0–0.1)
Basophils Relative: 1 %
Eosinophils Absolute: 0.2 10*3/uL (ref 0.0–0.5)
Eosinophils Relative: 2 %
HCT: 46.1 % (ref 39.0–52.0)
Hemoglobin: 15.5 g/dL (ref 13.0–17.0)
Immature Granulocytes: 1 %
Lymphocytes Relative: 18 %
Lymphs Abs: 1.2 10*3/uL (ref 0.7–4.0)
MCH: 31.4 pg (ref 26.0–34.0)
MCHC: 33.6 g/dL (ref 30.0–36.0)
MCV: 93.5 fL (ref 80.0–100.0)
Monocytes Absolute: 0.5 10*3/uL (ref 0.1–1.0)
Monocytes Relative: 8 %
Neutro Abs: 4.6 10*3/uL (ref 1.7–7.7)
Neutrophils Relative %: 70 %
Platelets: 186 10*3/uL (ref 150–400)
RBC: 4.93 MIL/uL (ref 4.22–5.81)
RDW: 13.6 % (ref 11.5–15.5)
WBC: 6.5 10*3/uL (ref 4.0–10.5)
nRBC: 0 % (ref 0.0–0.2)

## 2022-04-04 LAB — LACTATE DEHYDROGENASE: LDH: 138 U/L (ref 98–192)

## 2022-04-04 MED ORDER — HEPARIN SOD (PORK) LOCK FLUSH 100 UNIT/ML IV SOLN
500.0000 [IU] | Freq: Once | INTRAVENOUS | Status: AC
  Administered 2022-04-04: 500 [IU] via INTRAVENOUS
  Filled 2022-04-04: qty 5

## 2022-04-04 MED ORDER — SODIUM CHLORIDE 0.9% FLUSH
10.0000 mL | Freq: Once | INTRAVENOUS | Status: AC
  Administered 2022-04-04: 10 mL via INTRAVENOUS
  Filled 2022-04-04: qty 10

## 2022-04-04 MED ORDER — IOHEXOL 300 MG/ML  SOLN
100.0000 mL | Freq: Once | INTRAMUSCULAR | Status: AC | PRN
Start: 2022-04-04 — End: 2022-04-04
  Administered 2022-04-04: 100 mL via INTRAVENOUS

## 2022-04-07 ENCOUNTER — Encounter: Payer: Self-pay | Admitting: Oncology

## 2022-04-07 ENCOUNTER — Inpatient Hospital Stay (HOSPITAL_BASED_OUTPATIENT_CLINIC_OR_DEPARTMENT_OTHER): Payer: Medicare Other | Admitting: Oncology

## 2022-04-07 VITALS — BP 155/83 | HR 79 | Temp 98.0°F | Wt 162.0 lb

## 2022-04-07 DIAGNOSIS — C3431 Malignant neoplasm of lower lobe, right bronchus or lung: Secondary | ICD-10-CM

## 2022-04-07 DIAGNOSIS — Z95828 Presence of other vascular implants and grafts: Secondary | ICD-10-CM | POA: Diagnosis not present

## 2022-04-07 DIAGNOSIS — Z08 Encounter for follow-up examination after completed treatment for malignant neoplasm: Secondary | ICD-10-CM | POA: Diagnosis not present

## 2022-04-07 NOTE — Assessment & Plan Note (Signed)
Clinically patient is doing very well Labs reviewed and discussed with CT images were independent reviewed and discussed with patient.  No signs of recurrence Recommend continue surveillance CT scan in 6 months.

## 2022-04-08 NOTE — Progress Notes (Signed)
Hematology/Oncology Progress note [phone] [phone]      Patient Care Team: Baxter Hire, MD as PCP - General (Internal Medicine) Sherren Mocha, MD as PCP - Cardiology (Cardiology) Telford Nab, RN as Registered Nurse Baxter Hire, MD as Consulting Physician (Internal Medicine) Noreene Filbert, MD as Referring Physician (Radiation Oncology) Earlie Server, MD as Consulting Physician (Oncology)  REFERRING PROVIDER: Dr.Fleming REASON FOR VISIT:  Follow-up for management of stage III non-small cell lung cancer   ASSESSMENT & PLAN:  Malignant neoplasm of lower lobe of right lung [facility]) Clinically patient is doing very well Labs reviewed and discussed with CT images were independent reviewed and discussed with patient.  No signs of recurrence Recommend continue surveillance CT scan in 12 months.  Port-A-Cath in place Patient is about 4 years after he finished concurrent chemo therapy radiation No signs of recurrence.  Consult IR for medi port removal. He agrees.   Orders Placed This Encounter  Procedures   CT CHEST ABDOMEN PELVIS W CONTRAST    Standing Status:   Future    Standing Expiration Date:   04/08/2023    Order Specific Question:   If indicated for the ordered procedure, I authorize the administration of contrast media per Radiology protocol    Answer:   Yes    Order Specific Question:   Does the patient have a contrast media/X-ray dye allergy?    Answer:   No    Order Specific Question:   Preferred imaging location?    Answer:   [facility]    Order Specific Question:   Is Oral Contrast requested for this exam?    Answer:   Yes, Per Radiology protocol   IR Removal Tun Access W/ Port W/O FL    Standing Status:   Future    Standing Expiration Date:   04/08/2023    Order Specific Question:   Reason for Exam (SYMPTOM  OR DIAGNOSIS REQUIRED)    Answer:   Lung Cancer- Finished treatment    Order Specific Question:   Preferred  Imaging Location?    Answer:   [facility]   CBC with Differential/Platelet    Standing Status:   Future    Standing Expiration Date:   04/07/2023   Comprehensive metabolic panel    Standing Status:   Future    Standing Expiration Date:   04/07/2023   Lactate dehydrogenase    Standing Status:   Future    Standing Expiration Date:   04/08/2023   Follow-up in 6 months after CT scan. All questions were answered. The patient knows to call the clinic with any problems, questions or concerns.  Earlie Server, MD, PhD [facility] Hematology Oncology 04/07/2022    HISTORY OF PRESENTING ILLNESS:  Timothy Yoder is a  66 y.o.  male with PMH listed below who was referred to me for evaluation of lung mass.  Patient is a former smoker quit smoking 7 years ago.  40-pack-year. Patient chest lung cancer screening on 03/11/2018. CT scan showed 2.4 cm right lower lobe nodule with a new necrotic.  Subcarinal lymph node. PET scan showed hypermetabolic right lower pulmonary nodule, consistent with primary bronchogenic carcinoma.  Fluid density subcarinal structure demonstrate no significant hypermetabolism.  Although this could represent an incidental benign lesion such as a cyst given the interval development since 02/24/2017, necrotic adenopathy cannot be excluded and tissue sampling should be considered.  # underwent biopsy via bronchoscopy.  Subcarinal lymph node biopsy positive for non-small cell  lung cancer, favoring poorly differentiated adenocarcinoma.  Stage IIIA T1c N2M0 #Mediport was placed by Dr. Genevive Bi  # #Elevated alkaline phosphatase. Patient has been evaluated by gastroenterology.  Liver biopsy showed mild sinusoidal dilation and congestion.  Minimal changes suggestive of hepatic process. Iron stain demonstrate minimal focal hepatocellular iron. I discussed with Dr.Wohl via secure chat about patient's pathology result.  No restriction of proceeding with immunotherapy.  Continue watch for the  labs.   # Cancer Treatment 04/26/2018 -06/11/2018 Concurrent Chemotherapy [weekly Carbo and Taxol] with RT.  07/10/2018 maintenance Durvalumab. Finished immunotherapy in May 2021 INTERVAL HISTORY Timothy Yoder is a 66 y.o. male who has above history reviewed by me today presents for follow up visit for management of stage III non-small cell lung cancer.   Patient reports feeling well.  He has no new complaints.  Denies weight loss, fever, chills, fatigue, night sweats.    Review of Systems  Constitutional:  Negative for appetite change, chills, diaphoresis, fatigue, fever and unexpected weight change.  HENT:   Negative for hearing loss, lump/mass, nosebleeds, sore throat and voice change.   Eyes:  Negative for eye problems and icterus.  Respiratory:  Negative for chest tightness, cough, hemoptysis, shortness of breath and wheezing.   Cardiovascular:  Negative for chest pain and leg swelling.  Gastrointestinal:  Negative for abdominal distention, abdominal pain, blood in stool, diarrhea, nausea and rectal pain.  Endocrine: Negative for hot flashes.  Genitourinary:  Negative for bladder incontinence, difficulty urinating, dysuria, frequency, hematuria and nocturia.   Musculoskeletal:  Negative for arthralgias, back pain, flank pain, gait problem and myalgias.  Skin:  Negative for itching and rash.  Neurological:  Negative for dizziness, gait problem, headaches, light-headedness, numbness and seizures.  Hematological:  Negative for adenopathy. Does not bruise/bleed easily.  Psychiatric/Behavioral:  Negative for confusion and decreased concentration. The patient is not nervous/anxious.    MEDICAL HISTORY:  Past Medical History:  Diagnosis Date   BPH (benign prostatic hyperplasia)    CAD (coronary artery disease)    a. 04/2014 anterolateral STEMI/PCI: LM nl, LAD 90m(2.75x16 Promus DES, D1 50p, LCX nl, RI nl, OM1 100 small/chronic, RCA dominant, 20-351m7070m(FFR 0.90->Med Rx), RPDA 50, RPLA  70, EF 60%.   COPD (chronic obstructive pulmonary disease) (HCC)    Dr FleRaul DelFormer tobacco use    Gastroesophageal reflux disease    HTN (hypertension)    Hyperlipidemia    Hypokalemia    Malignant neoplasm of lung (HCC[place]ine[place]2/12/2017   Myocardial infarction ([facility]CC[place]1/2016   Dr MicSherren MochaStented coronary artery     SURGICAL HISTORY: Past Surgical History:  Procedure Laterality Date   CARDIAC CATHETERIZATION     CHOLECYSTECTOMY N/A 02/15/2016   Procedure: LAPAROSCOPIC CHOLECYSTECTOMY WITH INTRAOPERATIVE CHOLANGIOGRAM;  Surgeon: JarLeonie GreenD;  Location: [facility] ORS;  Service: General;  Laterality: N/A;   COLONOSCOPY     COLONOSCOPY WITH PROPOFOL N/A 08/02/2015   Procedure: COLONOSCOPY WITH PROPOFOL;  Surgeon: RobManya SilvasD;  [facility];  Service: Endoscopy;  Laterality: N/A;   COLONOSCOPY WITH PROPOFOL N/A 11/29/2020   Procedure: COLONOSCOPY WITH PROPOFOL;  Surgeon: LocLesly RubensteinD;  Location: [facility] ENDOSCOPY;  Service: Endoscopy;  Laterality: N/A;   CORONARY ANGIOPLASTY  04/2014   STENT   ENDOBRONCHIAL ULTRASOUND N/A 03/26/2018   Procedure: ENDOBRONCHIAL ULTRASOUND;  Surgeon: KasFlora LippsD;  Location: [facility] ORS;  Service: Cardiopulmonary;  Laterality: N/A;   ESOPHAGOGASTRODUODENOSCOPY (EGD) WITH PROPOFOL N/A 08/02/2015  Procedure: ESOPHAGOGASTRODUODENOSCOPY (EGD) WITH PROPOFOL;  Surgeon: Manya Silvas, MD;  Location[facility]COPY;  Service: Endoscopy;  Laterality: N/A;   ESOPHAGOGASTRODUODENOSCOPY (EGD) WITH PROPOFOL N/A 11/29/2020   Procedure: ESOPHAGOGASTRODUODENOSCOPY (EGD) WITH PROPOFOL;  Surgeon: Lesly Rubenstein, MD;  Location: [facility] ENDOSCOPY;  Service: Endoscopy;  Laterality: N/A;   FLEXIBLE BRONCHOSCOPY N/A 03/26/2018   Procedure: FLEXIBLE BRONCHOSCOPY;  Surgeon: Flora Lipps, MD;  Location: [facility] ORS;  Service: Cardiopulmonary;  Laterality: N/A;   KNEE ARTHROSCOPY Left    LEFT HEART CATHETERIZATION WITH CORONARY ANGIOGRAM  N/A 05/03/2014   Procedure: LEFT HEART CATHETERIZATION WITH CORONARY ANGIOGRAM;  Surgeon: Blane Ohara, MD;  Location: [facility] CATH LAB;  Service: Cardiovascular;  Laterality: N/A;   PERCUTANEOUS CORONARY STENT INTERVENTION (PCI-S) N/A 05/05/2014   Procedure: PERCUTANEOUS CORONARY STENT INTERVENTION (PCI-S);  Surgeon: Peter M Martinique, MD;  Location: [facility] CATH LAB;  Service: Cardiovascular;  Laterality: N/A;   PORTACATH PLACEMENT Left 04/04/2018   Procedure: INSERTION PORT-A-CATH;  Surgeon: [name], MD;  Location: [facility] ORS;  Service: General;  Laterality: Left;   UPPER GI ENDOSCOPY      SOCIAL HISTORY: Social History   Socioeconomic History   Marital status: Married    Spouse name: Not on file   Number of children: Not on file   Years of education: Not on file   Highest education level: Not on file  Occupational History   Not on file  Tobacco Use   Smoking status: Former    Packs/day: 1.00    Years: 35.00    Total pack years: 35.00    Types: Cigarettes    Quit date: 02/14/2011    Years since quitting: 11.1   Smokeless tobacco: Never  Vaping Use   Vaping Use: Never used  Substance and Sexual Activity   Alcohol use: No   Drug use: No   Sexual activity: Not on file  Other Topics Concern   Not on file  Social History Narrative   The patient is married. He lives in [place]. He works full-time. He quit smoking in 2013.   Social Determinants of Health   Financial Resource Strain: Not on file  Food Insecurity: Not on file  Transportation Needs: Not on file  Physical Activity: Not on file  Stress: Not on file  Social Connections: Not on file  Intimate Partner Violence: Not on file    FAMILY HISTORY: Family History  Problem Relation Age of Onset   CAD Mother 74       Died of an MI   CAD Father 8       Died of a massive MI   COPD Sister    Lung cancer Brother    Bone cancer Paternal Uncle    Colon cancer Neg Hx    Breast cancer Neg Hx     ALLERGIES:  has No  Known Allergies.  MEDICATIONS:  Current Outpatient Medications  Medication Sig Dispense Refill   acetaminophen (TYLENOL) 500 MG tablet Take 1,000 mg by mouth every 6 (six) hours as needed (for pain.).     albuterol (PROAIR HFA) 108 (90 Base) MCG/ACT inhaler Inhale 2 puffs into the lungs every 6 (six) hours as needed for wheezing or shortness of breath. 54 g 0   ANORO ELLIPTA 62.5-25 MCG/ACT AEPB USE 1 INHALATION DAILY 180 each 3   aspirin EC 81 MG tablet Take 81 mg by mouth daily.     atorvastatin (LIPITOR) 80 MG tablet TAKE 1 TABLET DAILY AT 6 P.M. 90 tablet 3  carvedilol (COREG) 6.25 MG tablet Take 1 tablet (6.25 mg total) by mouth 2 (two) times daily with a meal. 180 tablet 3   famotidine (PEPCID) 40 MG tablet Take 40 mg by mouth daily.     lidocaine-prilocaine (EMLA) cream Apply small amount to port site 1 hour prior to [facility] being accessed and cover with plastic wrap 30 g 1   nitroGLYCERIN (NITROSTAT) 0.4 MG SL tablet Place 1 tablet (0.4 mg total) under the tongue every 5 (five) minutes as needed for chest pain. X 3 doses 25 tablet 3   omeprazole (PRILOSEC) 40 MG capsule Take 1 capsule (40 mg total) by mouth 2 (two) times daily. 180 capsule 1   tamsulosin (FLOMAX) 0.4 MG CAPS capsule Take 0.4 mg by mouth daily after supper.     No current facility-administered medications for this visit.   Facility-Administered Medications Ordered in Other Visits  Medication Dose Route Frequency Provider Last Rate Last Admin   heparin lock flush 100 unit/mL  500 Units Intravenous Once Earlie Server, MD       sodium chloride flush (NS) 0.9 % injection 10 mL  10 mL Intravenous PRN Earlie Server, MD   10 mL at 09/26/21 1125     PHYSICAL EXAMINATION: ECOG PERFORMANCE STATUS: 1 - Symptomatic but completely ambulatory Vitals:   04/07/22 1157  BP: (!) 155/83  Pulse: 79  Temp: 98 F (36.7 C)  SpO2: 97%   Filed Weights   04/07/22 1157  Weight: 162 lb (73.5 kg)    Physical Exam Constitutional:       General: He is not in acute distress. HENT:     Head: Normocephalic and atraumatic.  Eyes:     General: No scleral icterus. Cardiovascular:     Rate and Rhythm: Normal rate and regular rhythm.     Heart sounds: Normal heart sounds.  Pulmonary:     Effort: Pulmonary effort is normal. No respiratory distress.     Breath sounds: No wheezing.  Abdominal:     General: Bowel sounds are normal. There is no distension.     Palpations: Abdomen is soft.  Musculoskeletal:        General: No deformity. Normal range of motion.     Cervical back: Normal range of motion and neck supple.  Skin:    General: Skin is warm and dry.     Findings: No erythema or rash.  Neurological:     Mental Status: He is alert and oriented to person, place, and time. Mental status is at baseline.     Cranial Nerves: No cranial nerve deficit.     Coordination: Coordination normal.  Psychiatric:        Mood and Affect: Mood normal.      LABORATORY DATA:  I have reviewed the data as listed Lab Results  Component Value Date   WBC 6.5 04/04/2022   HGB 15.5 04/04/2022   HCT 46.1 04/04/2022   MCV 93.5 04/04/2022   PLT 186 04/04/2022   Recent Labs    09/26/21 1121 04/04/22 1024  NA 135 141  K 3.8 3.8  CL 105 109  CO2 26 25  GLUCOSE 91 101*  BUN 13 11  CREATININE 0.99 1.04  CALCIUM 8.3* 8.4*  GFRNONAA >60 >60  PROT 6.8 6.7  ALBUMIN 3.8 3.8  AST 18 19  ALT 19 18  ALKPHOS 115 106  BILITOT 0.7 0.8    Iron/TIBC/Ferritin/ %Sat    Component Value Date/Time   IRON 41 (L) 11/21/2018  0856   TIBC 260 11/21/2018 0856   FERRITIN 150 11/21/2018 0856   IRONPCTSAT 16 (L) 11/21/2018 0856    RADIOGRAPHIC STUDIES: I have personally reviewed the radiological images as listed and agreed with the findings in the report.  CT CHEST ABDOMEN PELVIS W CONTRAST  Result Date: 04/04/2022 CLINICAL DATA:  66 year old male. Follow-up lung carcinoma. Stage IIIA lung carcinoma. RIGHT lower lobe lung carcinoma * Tracking  Code: BO * post concurrent chemoradiation therapy. EXAM: CT CHEST, ABDOMEN, AND PELVIS WITH CONTRAST TECHNIQUE: Multidetector CT imaging of the chest, abdomen and pelvis was performed following the standard protocol during bolus administration of intravenous contrast. RADIATION DOSE REDUCTION: This exam was performed according to the departmental dose-optimization program which includes automated exposure control, adjustment of the mA and/or kV according to patient size and/or use of iterative reconstruction technique. CONTRAST:  172m OMNIPAQUE IOHEXOL 300 MG/ML  SOLN COMPARISON:  09/26/2021 FINDINGS: CT CHEST FINDINGS CT CHEST FINDINGS Cardiovascular: No significant vascular findings. Normal heart size. No pericardial effusion. Cardiac stent noted. Port in the anterior chest wall with tip in distal SVC. Mediastinum/Nodes: No axillary or supraclavicular adenopathy. No mediastinal or hilar adenopathy. No pericardial fluid. Esophagus normal. Lungs/Pleura: RIGHT infrahilar peribronchial thickening similar to prior (images 112-131 of series 4). No discrete nodularity. Small calcified granuloma in the medial RIGHT lower lobe. LEFT lung clear. Musculoskeletal: No aggressive osseous lesion. CT ABDOMEN AND PELVIS FINDINGS Hepatobiliary: Several small hypodense lesions in the LEFT hepatic lobe are unchanged consistent benign cysts. Postcholecystectomy. Pancreas: Pancreas is normal. No ductal dilatation. No pancreatic inflammation. Spleen: Normal spleen Adrenals/urinary tract: Adrenal glands and kidneys are normal. The ureters and bladder normal. Stomach/Bowel: Stomach, small bowel, appendix, and cecum are normal. Multiple diverticula of the descending colon and sigmoid colon without acute inflammation. Vascular/Lymphatic: Abdominal aorta is normal caliber with atherosclerotic calcification. There is no retroperitoneal or periportal lymphadenopathy. No pelvic lymphadenopathy. Reproductive: Prostate unremarkable Other: No  peritoneal metastasis Musculoskeletal: No aggressive osseous lesion. IMPRESSION: 1. Stable post treatment change in the RIGHT lower lobe. No evidence of local recurrence. 2. No evidence of metastatic disease the thorax. 3. No evidence of metastatic disease in the abdomen pelvis. 4.  Aortic Atherosclerosis (ICD10-I70.0). Electronically Signed   By: SSuzy BouchardM.D.   On: 04/04/2022 19:17      COPD, continue albuterol inhaler  Follow up in 6 months.  ZEarlie Server MD, PhD 04/08/22

## 2022-04-08 NOTE — Assessment & Plan Note (Signed)
Patient is about 4 years after he finished concurrent chemo therapy radiation No signs of recurrence.  Consult IR for medi port removal. He agrees.

## 2022-04-14 ENCOUNTER — Telehealth: Payer: Self-pay

## 2022-04-14 NOTE — Telephone Encounter (Signed)
Left message for Timothy Yoder to let him know that port removal will be on 04/26/21 @ 1:30p w arrival 12:30p. Asked for him to let us know if date/time does not work .

## 2022-04-19 NOTE — Progress Notes (Signed)
Patient for IR Port Removal on Thurs 04/20/2022, I called and spoke with the patient on the phone and gave pre-procedure instructions. Pt was made aware to be here at 1:30p at the new entrance, NPO after MN prior to procedure as well as driver post procedure/recovery/discharge. Pt stated understanding.  Dr Denna Haggard stated that the pt would need moderate sedation due to the port was inserted on 04/04/2018 and by a surgeon.  Called 04/19/2022

## 2022-04-20 ENCOUNTER — Other Ambulatory Visit: Payer: Self-pay

## 2022-04-20 ENCOUNTER — Encounter: Payer: Self-pay | Admitting: Radiology

## 2022-04-20 ENCOUNTER — Ambulatory Visit
Admission: RE | Admit: 2022-04-20 | Discharge: 2022-04-20 | Disposition: A | Discharge status: Home / Self Care | Payer: Medicare Other | Source: Ambulatory Visit | Attending: Oncology | Admitting: Oncology

## 2022-04-20 DIAGNOSIS — C3431 Malignant neoplasm of lower lobe, right bronchus or lung: Secondary | ICD-10-CM | POA: Insufficient documentation

## 2022-04-20 DIAGNOSIS — Z87891 Personal history of nicotine dependence: Secondary | ICD-10-CM | POA: Diagnosis not present

## 2022-04-20 DIAGNOSIS — Z452 Encounter for adjustment and management of vascular access device: Secondary | ICD-10-CM | POA: Diagnosis present

## 2022-04-20 HISTORY — PX: IR REMOVAL TUN ACCESS W/ PORT W/O FL MOD SED: IMG2290

## 2022-04-20 HISTORY — PX: PORT-A-CATH REMOVAL: SHX5289

## 2022-04-20 MED ORDER — LIDOCAINE-EPINEPHRINE 1 %-1:100000 IJ SOLN
INTRAMUSCULAR | Status: AC
  Administered 2022-04-20: 6 mL
  Filled 2022-04-20: qty 1

## 2022-04-20 MED ORDER — SODIUM CHLORIDE 0.9 % IV SOLN: INTRAVENOUS | Status: DC

## 2022-04-20 MED ORDER — FENTANYL CITRATE (PF) 100 MCG/2ML IJ SOLN
INTRAMUSCULAR | Status: AC
  Filled 2022-04-20: qty 2

## 2022-04-20 MED ORDER — FENTANYL CITRATE (PF) 100 MCG/2ML IJ SOLN
INTRAMUSCULAR | Status: DC | PRN
  Administered 2022-04-20: 50 ug via INTRAVENOUS

## 2022-04-20 MED ORDER — MIDAZOLAM HCL 2 MG/2ML IJ SOLN
INTRAMUSCULAR | Status: DC | PRN
  Administered 2022-04-20: 1 mg via INTRAVENOUS

## 2022-04-20 MED ORDER — MIDAZOLAM HCL 2 MG/2ML IJ SOLN
INTRAMUSCULAR | Status: AC
  Filled 2022-04-20: qty 2

## 2022-04-20 NOTE — Procedures (Signed)
Interventional Radiology Procedure Note  Procedure: Chest port removal   Indication: Completion of chemotherapy  Findings: Please refer to procedural dictation for full description.  Complications: None  EBL: < 10 mL  Miachel Roux, MD [phone]

## 2022-04-20 NOTE — Progress Notes (Signed)
Patient here awaiting procedure but there are not orders for procedure.  Dr. Dwaine Gale made aware. Dr. Dwaine Gale requested sedating IR nurse to place necessary orders for procedures.  Will bring patient back to pre op when orders are entered.

## 2022-04-20 NOTE — Consult Note (Signed)
Chief Complaint: Port Removal  Referring Physician(s): Yu,Zhou  Patient Status: [facility] - Out-pt  History of Present Illness: Timothy Yoder is a 66 y.o. male with history of lung cancer has completed his chemotherapy and presents to IR for chest port removal.  He is doing well today and denies SOB, Chest pain, fever or chills.  Past Medical History:  Diagnosis Date   BPH (benign prostatic hyperplasia)    CAD (coronary artery disease)    a. 04/2014 anterolateral STEMI/PCI: LM nl, LAD 81m(2.75x16 Promus DES, D1 50p, LCX nl, RI nl, OM1 100 small/chronic, RCA dominant, 20-326m7011m(FFR 0.90->Med Rx), RPDA 50, RPLA 70, EF 60%.   COPD (chronic obstructive pulmonary disease) (HCC)    Dr FleRaul DelFormer tobacco use    Gastroesophageal reflux disease    HTN (hypertension)    Hyperlipidemia    Hypokalemia    Malignant ne[place] of lung ([place]12[place]2017   Myocardial infarction ([place]CC[place]1/[place]   Dr MicSherren MochaStented coronary artery     Past Surgical History:  Procedure Laterality Date   CARDIAC CATHETERIZATION     CHOLECYSTECTOMY N/A 02/15/2016   Procedure: LAPAROSCOPIC CHOLECYSTECTOMY WITH INTRAOPERATIVE CHOLANGIOGRAM;  Surgeon: JarLeonie GreenD;  Location: [facility] ORS;  Service: General;  Laterality: N/A;   COLONOSCOPY     COLONOSCOPY WITH PROPOFOL N/A 08/02/2015   Procedure: COLONOSCOPY WITH PROPOFOL;  Surgeon: RobManya SilvasD;  [facility];  Service: Endoscopy;  Laterality: N/A;   COLONOSCOPY WITH PROPOFOL N/A 11/29/2020   Procedure: COLONOSCOPY WITH PROPOFOL;  Surgeon: LocLesly RubensteinD;  Location: [facility] ENDOSCOPY;  Service: Endoscopy;  Laterality: N/A;   CORONARY ANGIOPLASTY  04/2014   STENT   ENDOBRONCHIAL ULTRASOUND N/A 03/26/2018   Procedure: ENDOBRONCHIAL ULTRASOUND;  Surgeon: KasFlora LippsD;  Location: [facility] ORS;  Service: Cardiopulmonary;  Laterality: N/A;   ESOPHAGOGASTRODUODENOSCOPY (EGD) WITH PROPOFOL N/A 08/02/2015   Procedure:  ESOPHAGOGASTRODUODENOSCOPY (EGD) WITH PROPOFOL;  Surgeon: RobManya SilvasD;  [facility];  Service: Endoscopy;  Laterality: N/A;   ESOPHAGOGASTRODUODENOSCOPY (EGD) WITH PROPOFOL N/A 11/29/2020   Procedure: ESOPHAGOGASTRODUODENOSCOPY (EGD) WITH PROPOFOL;  Surgeon: LocLesly RubensteinD;  Location: [facility] ENDOSCOPY;  Service: Endoscopy;  Laterality: N/A;   FLEXIBLE BRONCHOSCOPY N/A 03/26/2018   Procedure: FLEXIBLE BRONCHOSCOPY;  Surgeon: KasFlora LippsD;  Location: [facility] ORS;  Service: Cardiopulmonary;  Laterality: N/A;   KNEE ARTHROSCOPY Left    LEFT HEART CATHETERIZATION WITH CORONARY ANGIOGRAM N/A 05/03/2014   Procedure: LEFT HEART CATHETERIZATION WITH CORONARY ANGIOGRAM;  Surgeon: MicBlane OharaD;  [facility];  Service: Cardiovascular;  Laterality: N/A;   PERCUTANEOUS CORONARY STENT INTERVENTION (PCI-S) N/A 05/05/2014   Procedure: PERCUTANEOUS CORONARY STENT INTERVENTION (PCI-S);  Surgeon: Peter M JorMartiniqueD;  [facility];  Service: Cardiovascular;  Laterality: N/A;   PORTACATH PLACEMENT Left 04/04/2018   Procedure: INSERTION PORT-A-CATH;  Surgeon: OakNestor LewandowskyD;  Location: [facility] ORS;  Service: General;  Laterality: Left;   UPPER GI ENDOSCOPY      Allergies: Patient has no known allergies.  Medications: Prior to Admission medications   Medication Sig Start Date End Date Taking? Authorizing Provider  albuterol (PROAIR HFA) 108 (90 Base) MCG/ACT inhaler Inhale 2 puffs into the lungs every 6 (six) hours as needed for wheezing or shortness of breath. 06/03/21  Yes MacLauraine RinneP  aspirin EC 81 MG tablet Take 81 mg by mouth daily.   Yes [provider]  atorvastatin (LIPITOR)  80 MG tablet TAKE 1 TABLET DAILY AT 6 P.M. 11/02/20  Yes Sherren Mocha, MD  carvedilol (COREG) 6.25 MG tablet Take 1 tablet (6.25 mg total) by mouth 2 (two) times daily with a meal. 03/15/22  Yes Sherren Mocha, MD  omeprazole (PRILOSEC) 40 MG capsule Take 1 capsule  (40 mg total) by mouth 2 (two) times daily. 04/07/19  Yes Earlie Server, MD  tamsulosin (FLOMAX) 0.4 MG CAPS capsule Take 0.4 mg by mouth daily after supper.   Yes [provider]  acetaminophen (TYLENOL) 500 MG tablet Take 1,000 mg by mouth every 6 (six) hours as needed (for pain.).    [provider]  Celedonio Miyamoto 62.5-25 MCG/ACT AEPB USE 1 INHALATION DAILY 09/26/21   Parrett, Fonnie Mu, NP  famotidine (PEPCID) 40 MG tablet Take 40 mg by mouth daily. 08/31/20 04/07/22  [provider]  lidocaine-prilocaine (EMLA) cream Apply small amount to port site 1 hour prior to [facility] being accessed and cover with plastic wrap 02/27/19   Earlie Server, MD  nitroGLYCERIN (NITROSTAT) 0.4 MG SL tablet Place 1 tablet (0.4 mg total) under the tongue every 5 (five) minutes as needed for chest pain. X 3 doses 11/15/20   Sherren Mocha, MD  prochlorperazine (COMPAZINE) 10 MG tablet Take 1 tablet (10 mg total) by mouth every 6 (six) hours as needed (Nausea or vomiting). 04/01/18 02/07/19  Earlie Server, MD     Family History  Problem Relation Age of Onset   CAD Mother 20       Died of an MI   CAD Father 4       Died of a massive MI   COPD Sister    Lung cancer Brother    Bone cancer Paternal Uncle    Colon cancer Neg Hx    Breast cancer Neg Hx     Social History   Socioeconomic History   Marital status: Married    Spouse name: Not on file   Number of children: Not on file   Years of education: Not on file   Highest education level: Not on file  Occupational History   Not on file  Tobacco Use   Smoking status: Former    Packs/day: 1.00    Years: 35.00    Total pack years: 35.00    Types: Cigarettes    Quit date: 02/14/2011    Years since quitting: 11.1   Smokeless tobacco: Never  Vaping Use   Vaping Use: Never used  Substance and Sexual Activity   Alcohol use: No   Drug use: No   Sexual activity: Not on file  Other Topics Concern   Not on file  Social History Narrative   The  patient is married. He lives in [place]. He works full-time. He quit smoking in 2013.   Social Determinants of Health   Financial Resource Strain: Not on file  Food Insecurity: Not on file  Transportation Needs: Not on file  Physical Activity: Not on file  Stress: Not on file  Social Connections: Not on file   Review of Systems: A 12 point ROS discussed and pertinent positives are indicated in the HPI above.  All other systems are negative.  Review of Systems  Vital Signs: BP (!) 169/102   Pulse 70   Temp 97.7 F (36.5 C) (Oral)   Resp 19   Ht _0  (1.702 m)   Wt 73 kg   SpO2 97%   BMI 25.22 kg/m   Advance Care Plan:  The advanced care plan/surrogate decision maker was discussed at the time of visit and the patient did not wish to discuss or was not able to name a surrogate decision maker or provide an advance care plan.    Physical Exam Constitutional:      Appearance: Normal appearance.  Cardiovascular:     Rate and Rhythm: Normal rate and regular rhythm.     Heart sounds: Normal heart sounds.  Pulmonary:     Effort: Pulmonary effort is normal.     Breath sounds: Normal breath sounds.  Abdominal:     Palpations: Abdomen is soft.  Skin:    General: Skin is warm and dry.  Neurological:     Mental Status: He is alert and oriented to person, place, and time.     Imaging: CT CHEST ABDOMEN PELVIS W CONTRAST  Result Date: 04/04/2022 CLINICAL DATA:  66 year old male. Follow-up lung carcinoma. Stage IIIA lung carcinoma. RIGHT lower lobe lung carcinoma * Tracking Code: BO * post concurrent chemoradiation therapy. EXAM: CT CHEST, ABDOMEN, AND PELVIS WITH CONTRAST TECHNIQUE: Multidetector CT imaging of the chest, abdomen and pelvis was performed following the standard protocol during bolus administration of intravenous contrast. RADIATION DOSE REDUCTION: This exam was performed according to the departmental dose-optimization program which includes automated exposure  control, adjustment of the mA and/or kV according to patient size and/or use of iterative reconstruction technique. CONTRAST:  138m OMNIPAQUE IOHEXOL 300 MG/ML  SOLN COMPARISON:  09/26/2021 FINDINGS: CT CHEST FINDINGS CT CHEST FINDINGS Cardiovascular: No significant vascular findings. Normal heart size. No pericardial effusion. Cardiac stent noted. Port in the anterior chest wall with tip in distal SVC. Mediastinum/Nodes: No axillary or supraclavicular adenopathy. No mediastinal or hilar adenopathy. No pericardial fluid. Esophagus normal. Lungs/Pleura: RIGHT infrahilar peribronchial thickening similar to prior (images 112-131 of series 4). No discrete nodularity. Small calcified granuloma in the medial RIGHT lower lobe. LEFT lung clear. Musculoskeletal: No aggressive osseous lesion. CT ABDOMEN AND PELVIS FINDINGS Hepatobiliary: Several small hypodense lesions in the LEFT hepatic lobe are unchanged consistent benign cysts. Postcholecystectomy. Pancreas: Pancreas is normal. No ductal dilatation. No pancreatic inflammation. Spleen: Normal spleen Adrenals/urinary tract: Adrenal glands and kidneys are normal. The ureters and bladder normal. Stomach/Bowel: Stomach, small bowel, appendix, and cecum are normal. Multiple diverticula of the descending colon and sigmoid colon without acute inflammation. Vascular/Lymphatic: Abdominal aorta is normal caliber with atherosclerotic calcification. There is no retroperitoneal or periportal lymphadenopathy. No pelvic lymphadenopathy. Reproductive: Prostate unremarkable Other: No peritoneal metastasis Musculoskeletal: No aggressive osseous lesion. IMPRESSION: 1. Stable post treatment change in the RIGHT lower lobe. No evidence of local recurrence. 2. No evidence of metastatic disease the thorax. 3. No evidence of metastatic disease in the abdomen pelvis. 4.  Aortic Atherosclerosis (ICD10-I70.0). Electronically Signed   By: SSuzy BouchardM.D.   On: 04/04/2022 19:17     Labs:  CBC: Recent Labs    09/26/21 1121 04/04/22 1024  WBC 6.0 6.5  HGB 15.0 15.5  HCT 45.0 46.1  PLT 218 186    COAGS: No results for input(s): "INR", "APTT" in the last 8760 hours.  BMP: Recent Labs    09/26/21 1121 04/04/22 1024  NA 135 141  K 3.8 3.8  CL 105 109  CO2 26 25  GLUCOSE 91 101*  BUN 13 11  CALCIUM 8.3* 8.4*  CREATININE 0.99 1.04  GFRNONAA >60 >60    LIVER FUNCTION TESTS: Recent Labs    09/26/21 1121 04/04/22 1024  BILITOT 0.7 0.8  AST 18  19  ALT 19 18  ALKPHOS 115 106  PROT 6.8 6.7  ALBUMIN 3.8 3.8    TUMOR MARKERS: No results for input(s): "AFPTM", "CEA", "CA199", "CHROMGRNA" in the last 8760 hours.  Assessment and Plan:  66 year old gentleman with history of lung cancer has completed his chemotherapy and presents to IR for chest port removal.  Risks and benefits of port-a-catheter removal were discussed with the patient including, but not limited to bleeding, infection, pneumothorax, or catheter fracture and need for additional procedures.  All of the patient's questions were answered, patient is agreeable to proceed. Consent signed and in chart.  Thank you for this interesting consult.  I greatly enjoyed meeting Timothy Yoder and look forward to participating in their care.  A copy of this report was sent to the requesting provider on this date.  Electronically Signed: Paula Libra Harold Mattes, MD 04/20/2022, 2:41 PM   I spent a total of  15 Minutes  in face to face in clinical consultation, greater than 50% of which was counseling/coordinating care for [facility] Removal

## 2022-04-25 ENCOUNTER — Ambulatory Visit: Payer: Medicare Other | Admitting: Radiology

## 2022-04-26 ENCOUNTER — Ambulatory Visit: Payer: Medicare Other | Admitting: Radiology

## 2022-05-08 ENCOUNTER — Encounter: Payer: Self-pay | Admitting: Cardiovascular Disease

## 2022-05-08 MED ORDER — ATORVASTATIN CALCIUM 80 MG PO TABS: ORAL_TABLET | ORAL | 2 refills | Status: DC

## 2022-05-15 ENCOUNTER — Inpatient Hospital Stay: Payer: Medicare Other

## 2022-07-06 ENCOUNTER — Encounter: Payer: Self-pay | Admitting: Cardiovascular Disease

## 2022-07-06 MED ORDER — CARVEDILOL 6.25 MG PO TABS: 6.2500 mg | ORAL_TABLET | Freq: Two times a day (BID) | ORAL | 1 refills | Status: DC

## 2022-07-10 ENCOUNTER — Inpatient Hospital Stay: Payer: BLUE CROSS/BLUE SHIELD

## 2022-07-17 ENCOUNTER — Ambulatory Visit
Admission: RE | Admit: 2022-07-17 | Discharge: 2022-07-17 | Disposition: A | Discharge status: Home / Self Care | Payer: Medicare Other | Source: Ambulatory Visit | Attending: Radiation Oncology | Admitting: Radiation Oncology

## 2022-07-17 ENCOUNTER — Encounter: Payer: Self-pay | Admitting: Radiation Oncology

## 2022-07-17 VITALS — BP 137/89 | HR 79 | Temp 97.6°F | Resp 12 | Wt 159.0 lb

## 2022-07-17 DIAGNOSIS — Z85118 Personal history of other malignant neoplasm of bronchus and lung: Secondary | ICD-10-CM | POA: Insufficient documentation

## 2022-07-17 DIAGNOSIS — Z923 Personal history of irradiation: Secondary | ICD-10-CM | POA: Diagnosis not present

## 2022-07-17 DIAGNOSIS — C3431 Malignant neoplasm of lower lobe, right bronchus or lung: Secondary | ICD-10-CM

## 2022-07-17 NOTE — Progress Notes (Signed)
Radiation Oncology Follow up Note  Name: Timothy Yoder   Date:   07/17/2022 MRN:  [id number] DOB: Dec 23, 1955    This 67 y.o. male presents to the clinic today for 4-year follow-up.  Status post concurrent chemoradiation therapy for stage IIIa non-small cell lung cancer of the right lower lobe  REFERRING PROVIDER: Baxter Hire, MD  HPI: Patient is a 67 year old male now out over 4 years having completed concurrent chemoradiation therapy for stage IIIa non-small cell lung cancer of the right lower lobe seen today in routine follow-up he is doing well.  He specifically denies any change in pulmonary status function cough hemoptysis or chest tightness.Marland Kitchen  His last CT scan which I have reviewed showed stable posttreatment changes in the right lower lobe no evidence of local recurrence or progression.  COMPLICATIONS OF TREATMENT: none  FOLLOW UP COMPLIANCE: keeps appointments   PHYSICAL EXAM:  BP 137/89   Pulse 79   Temp 97.6 F (36.4 C) (Tympanic)   Resp 12   Wt 159 lb (72.1 kg)   BMI 24.90 kg/m  Well-developed well-nourished patient in NAD. HEENT reveals PERLA, EOMI, discs not visualized.  Oral cavity is clear. No oral mucosal lesions are identified. Neck is clear without evidence of cervical or supraclavicular adenopathy. Lungs are clear to A&P. Cardiac examination is essentially unremarkable with regular rate and rhythm without murmur rub or thrill. Abdomen is benign with no organomegaly or masses noted. Motor sensory and DTR levels are equal and symmetric in the upper and lower extremities. Cranial nerves II through XII are grossly intact. Proprioception is intact. No peripheral adenopathy or edema is identified. No motor or sensory levels are noted. Crude visual fields are within normal range.  RADIOLOGY RESULTS: Serial CT scans reviewed compatible with above-stated findings  PLAN: At this time I am going to turn follow-up care over to medical oncology.  They will be following up with  serial CT scans.  I be happy to reevaluate the patient in time the futureYou should that be indicated.  Patient is to call with any concerns.  I would like to take this opportunity to thank you for allowing me to participate in the care of your patient.Noreene Filbert, MD

## 2022-08-12 ENCOUNTER — Encounter: Payer: Self-pay | Admitting: Cardiovascular Disease

## 2022-08-14 MED ORDER — CARVEDILOL 6.25 MG PO TABS: 6.2500 mg | ORAL_TABLET | Freq: Two times a day (BID) | ORAL | 1 refills | Status: DC

## 2022-09-04 ENCOUNTER — Inpatient Hospital Stay: Payer: BLUE CROSS/BLUE SHIELD

## 2022-10-03 ENCOUNTER — Encounter: Payer: Self-pay | Admitting: Internal Medicine

## 2022-10-03 ENCOUNTER — Ambulatory Visit: Payer: Medicare Other | Admitting: Internal Medicine

## 2022-10-03 VITALS — BP 138/80 | HR 105 | Temp 97.3°F | Ht 67.0 in | Wt 157.2 lb

## 2022-10-03 DIAGNOSIS — J449 Chronic obstructive pulmonary disease, unspecified: Secondary | ICD-10-CM | POA: Diagnosis not present

## 2022-10-03 DIAGNOSIS — J441 Chronic obstructive pulmonary disease with (acute) exacerbation: Secondary | ICD-10-CM | POA: Diagnosis not present

## 2022-10-03 MED ORDER — PREDNISONE 10 MG PO TABS: 10.0000 mg | ORAL_TABLET | Freq: Every day | ORAL | 0 refills | Status: DC

## 2022-10-03 MED ORDER — ALBUTEROL SULFATE HFA 108 (90 BASE) MCG/ACT IN AERS: 2.0000 | INHALATION_SPRAY | RESPIRATORY_TRACT | 10 refills | Status: AC | PRN

## 2022-10-03 NOTE — Progress Notes (Signed)
@Patient  ID: Timothy Yoder, male    DOB: Aug 16, 1955, 67 y.o.   MRN: [id number]     TEST/EVENTS :  03/11/2018 CT chest lung screen 1. New 2.4 cm right lower lobe nodule with a new necrotic appearing subcarinal lymph node. Lung-RADS 4B, suspicious. Additional imaging evaluation or consultation with Pulmonology or Thoracic Surgery recommended. These results will be called to the ordering clinician or representative by the Radiologist Assistant, and communication documented in the PACS or zVision Dashboard. 2. Aortic atherosclerosis (ICD10-170.0). Coronary arterycalcification.3.  Emphysema (ICD10-J43.9).   03/13/2018 PET scan # Hypermetabolic right lower lobe pulmonary nodule, consistent with primary bronchogenic carcinoma. Fluid density subcarinal structure demonstrates no significant hypermetabolism. Although this could represent an incidental benign lesion such as a bronchogenic cyst, given interval development since 02/24/2017, necrotic adenopathy cannot be excluded and tissue sampling should be considered.  CT chest 03/24/21 s/p tx changes in RLL , no evidence of recurrent or metastaic disease .  Hepatic steatosis.  Moderately large duodenal diverticulum. Sigmoid diverticular changes without signs of adjacent inflammation. Three-vessel coronary artery calcification.  # Cancer Treatment 04/26/2018 -06/11/2018 Concurrent Chemotherapy [weekly Carbo and Taxol] with RT.  07/10/2018 maintenance Durvalumab. Finished immunotherapy in May 2021  10/06/2019: FEV1 was 56%, ratio 53, FVC 79%, no significant bronchodilator response.  DLCO was 41%      CC Follow up COPD Follow up lung cancer  HPI: 66 yo male former smoker quit 2012 followed for Moderate COPD, lung cancer adenocarcinoma IIIa Right lower lobe diagnosed in December 2019   Follow-up COPD Moderate based on pulmonary function test Previously on Anoro daily Overall says his breathing is doing well.  He denies any increased cough or  wheezing.  No change in activity tolerance. Does get short of breath with heavy activity . No recent steroids or antibiotics. Influenza , Covid and Prevnar  up to date.  Says has had Pneumovax.  Works part-time at [employer]. He walks a lot at work . Starting a low carb diet this week.  Overall says he feels well. No chest pain , orthopnea, edema .  Uses ProAir inhaler on occasion.  Denies any increased recent use.  Patient says he is going to check with his formulary as insurance does not cover ProAir very well.   Follow-up lung cancer Patient has a history of lung cancer diagnosed in 2019.  Underwent concurrent chemotherapy and radiation.  Followed by immunotherapy which was completed in May 2021.  Serial CT follow-up has shown no evidence of recurrent or metastatic disease.  Last CT chest was in March 24, 2021. Has planned CT chest in June 2023 with Oncology .     +exacerbation at this time No evidence of heart failure at this time No evidence or signs of infection at this time No respiratory distress No fevers, chills, nausea, vomiting, diarrhea No evidence of lower extremity edema No evidence hemoptysis         No Known Allergies  Immunization History  Administered Date(s) Administered   Influenza,inj,Quad PF,6+ Mos 05/24/2016, 01/27/2019   Influenza,inj,quad, With Preservative 02/25/2018   Influenza-Unspecified 02/26/2017, 02/26/2018   PFIZER(Purple Top)SARS-COV-2 Vaccination 06/05/2019, 07/01/2019, 04/26/2020   Pneumococcal Conjugate-13 07/06/2020   Tdap 06/05/2014   Zoster, Live 05/25/2010    Past Medical History:  Diagnosis Date   BPH (benign prostatic hyperplasia)    CAD (coronary artery disease)    a. 04/2014 anterolateral STEMI/PCI: LM nl, LAD 59m (2.75x16 Promus DES, D1 50p, LCX nl, RI nl, OM1 100 small/chronic,  RCA dominant, 20-11m, 22m/d (FFR 0.90->Med Rx), RPDA 50, RPLA 70, EF 60%.   COPD (chronic obstructive pulmonary disease) (HCC)    Dr Meredeth Ide    Former tobacco use    Gastroesophageal reflux disease    HTN (hypertension)    Hyperlipidemia    Hypokalemia    Malignant neoplasm of lung (HCC) 04/01/2018   Myocardial infarction (HCC) 04/2014   Dr Tonny Bollman   Stented coronary artery     Tobacco History: Social History   Tobacco Use  Smoking Status Former   Packs/day: 1.00   Years: 35.00   Additional pack years: 0.00   Total pack years: 35.00   Types: Cigarettes   Quit date: 02/14/2011   Years since quitting: 11.6  Smokeless Tobacco Never   Counseling given: Not Answered   Outpatient Medications Prior to Visit  Medication Sig Dispense Refill   acetaminophen (TYLENOL) 500 MG tablet Take 1,000 mg by mouth every 6 (six) hours as needed (for pain.).     albuterol (PROAIR HFA) 108 (90 Base) MCG/ACT inhaler Inhale 2 puffs into the lungs every 6 (six) hours as needed for wheezing or shortness of breath. 54 g 0   ANORO ELLIPTA 62.5-25 MCG/ACT AEPB USE 1 INHALATION DAILY 180 each 3   aspirin EC 81 MG tablet Take 81 mg by mouth daily.     atorvastatin (LIPITOR) 80 MG tablet TAKE 1 TABLET DAILY AT 6 P.M. 90 tablet 2   carvedilol (COREG) 6.25 MG tablet Take 1 tablet (6.25 mg total) by mouth 2 (two) times daily with a meal. 180 tablet 1   famotidine (PEPCID) 40 MG tablet Take 40 mg by mouth daily.     nitroGLYCERIN (NITROSTAT) 0.4 MG SL tablet Place 1 tablet (0.4 mg total) under the tongue every 5 (five) minutes as needed for chest pain. X 3 doses 25 tablet 3   omeprazole (PRILOSEC) 40 MG capsule Take 1 capsule (40 mg total) by mouth 2 (two) times daily. 180 capsule 1   tamsulosin (FLOMAX) 0.4 MG CAPS capsule Take 0.4 mg by mouth daily after supper.     Facility-Administered Medications Prior to Visit  Medication Dose Route Frequency Provider Last Rate Last Admin   heparin lock flush 100 unit/mL  500 Units Intravenous Once Rickard Patience, MD       sodium chloride flush (NS) 0.9 % injection 10 mL  10 mL Intravenous PRN Rickard Patience, MD    10 mL at 09/26/21 1125   BP 138/80 (BP Location: Left Arm, Cuff Size: Normal)   Pulse (!) 105   Temp (!) 97.3 F (36.3 C)   Ht 5\' 7"  (1.702 m)   Wt 157 lb 3.2 oz (71.3 kg)   SpO2 97%   BMI 24.62 kg/m     Review of Systems: Gen:  Denies  fever, sweats, chills weight loss  HEENT: Denies blurred vision, double vision, ear pain, eye pain, hearing loss, nose bleeds, sore throat Cardiac:  No dizziness, chest pain or heaviness, chest tightness,edema, No JVD Resp:   No cough, +sputum production, +shortness of breath,+wheezing, -hemoptysis,  Other:  All other systems negative   Physical Examination:   General Appearance: No distress  EYES PERRLA, EOM intact.   NECK Supple, No JVD Pulmonary: normal breath sounds, + wheezing.  CardiovascularNormal S1,S2.  No m/r/g.   Abdomen: Benign, Soft, non-tender. Neurology UE/LE 5/5 strength, no focal deficits Ext pulses intact, cap refill intact ALL OTHER ROS ARE NEGATIVE     Assessment & Plan:  Chronic obstructive pulmonary disease (HCC) Recent upper respiratory tract infection consistent with COPD exacerbation that seems to be improving with time however still has residual cough and wheezing at rest We will plan for prednisone 10 mg daily for 7 days Refill albuterol inhaler 2 to 4 puffs every 4 hours as needed  Moderate COPD Continue inhalers as prescribed with Anoro Immunizations up-to-date Avoid secondhand smoke   Malignant neoplasm of lower lobe of right lung (HCC) Serial CT chest shows no evidence of metastatic or recurrent disease.   Follow-up CT scans with oncology as scheduled     MEDICATION ADJUSTMENTS/LABS AND TESTS ORDERED: Prednisone 10 mg daily for 7 days  Start albuterol inhaler 2 to 4 puffs as needed every 4 hours for wheezing and shortness of breath  Avoid secondhand smoke Avoid SICK contacts Recommend  Masking  when appropriate Recommend Keep up-to-date with vaccinations   CURRENT MEDICATIONS  REVIEWED AT LENGTH WITH PATIENT TODAY   Patient  satisfied with Plan of action and management. All questions answered  Follow up  1 year  Total Time Spent  25 mins   Siris Hoos Santiago Yoder, M.D.  [facility]  Medical Director [facility] Medical Director [facility] Cardio-Pulmonary Department

## 2022-10-03 NOTE — Patient Instructions (Signed)
Prednisone 10 mg daily for 7 days  Start albuterol inhaler 2 to 4 puffs as needed every 4 hours for wheezing and shortness of breath  Avoid secondhand smoke Avoid SICK contacts Recommend  Masking  when appropriate Recommend Keep up-to-date with vaccinations

## 2022-12-10 NOTE — Progress Notes (Unsigned)
Cardiology Office Note:    Date:  12/11/2022   ID:  Timothy Yoder, DOB 14-Apr-1956, MRN [id number]  PCP:  Gracelyn Nurse, MD   [facility] Providers Cardiologist:  Tonny Bollman, MD     Referring MD: Gracelyn Nurse, MD   Chief Complaint  Patient presents with   Coronary Artery Disease    History of Present Illness:    Timothy Yoder is a 67 y.o. male with a hx of coronary artery disease, initially presenting with an anterolateral STEMI in 2016 treated with a drug-eluting stent to the mid LAD.  LVEF at that time was 60%. He was noted to have moderate stenosis of the right coronary artery evaluated by pressure wire with recommendations for medical management.  The patient has non-small cell lung cancer and he is followed closely by his oncologist. He has completed chemotherapy and radiation therapy, now in surveillance program with serial CT studies.   The patient is here alone today.  He has been doing well from a cardiac perspective.  His lung cancer remains in remission with last CT surveillance in December 2023.  I have reviewed those studies today.  He has regular follow-up with pulmonology and oncology.  He is known to have moderate COPD and is limited by shortness of breath with any moderate level physical exertion.  He has atypical chest pains, occurring at rest and fleeting in nature, sharp and left-sided.  He has no exertional chest pain or pressure.  He rarely has numbness down his left arm but also not related to physical exertion.  No orthopnea, PND, or leg edema.  States that his blood pressure has been controlled.  Past Medical History:  Diagnosis Date   BPH (benign prostatic hyperplasia)    CAD (coronary artery disease)    a. 04/2014 anterolateral STEMI/PCI: LM nl, LAD 66m (2.75x16 Promus DES, D1 50p, LCX nl, RI nl, OM1 100 small/chronic, RCA dominant, 20-29m, 34m/d (FFR 0.90->Med Rx), RPDA 50, RPLA 70, EF 60%.   COPD (chronic obstructive pulmonary disease)  (HCC)    Dr Meredeth Ide   Former tobacco use    Gastroesophageal reflux disease    HTN (hypertension)    Hyperlipidemia    Hypokalemia    Malignant neoplasm of lung (HCC) 04/01/2018   Myocardial infarction (HCC) 04/2014   Dr Tonny Bollman   Stented coronary artery     Past Surgical History:  Procedure Laterality Date   CARDIAC CATHETERIZATION     CHOLECYSTECTOMY N/A 02/15/2016   Procedure: LAPAROSCOPIC CHOLECYSTECTOMY WITH INTRAOPERATIVE CHOLANGIOGRAM;  Surgeon: Nadeen Landau, MD;  Location: [facility] ORS;  Service: General;  Laterality: N/A;   COLONOSCOPY     COLONOSCOPY WITH PROPOFOL N/A 08/02/2015   Procedure: COLONOSCOPY WITH PROPOFOL;  Surgeon: Scot Jun, MD;  Location: [facility] ENDOSCOPY;  Service: Endoscopy;  Laterality: N/A;   COLONOSCOPY WITH PROPOFOL N/A 11/29/2020   Procedure: COLONOSCOPY WITH PROPOFOL;  Surgeon: Regis Bill, MD;  Location: [facility] ENDOSCOPY;  Service: Endoscopy;  Laterality: N/A;   CORONARY ANGIOPLASTY  04/2014   STENT   ENDOBRONCHIAL ULTRASOUND N/A 03/26/2018   Procedure: ENDOBRONCHIAL ULTRASOUND;  Surgeon: Erin Fulling, MD;  Location: [facility] ORS;  Service: Cardiopulmonary;  Laterality: N/A;   ESOPHAGOGASTRODUODENOSCOPY (EGD) WITH PROPOFOL N/A 08/02/2015   Procedure: ESOPHAGOGASTRODUODENOSCOPY (EGD) WITH PROPOFOL;  Surgeon: Scot Jun, MD;  Location[facility] ENDOSCOPY;  Service: Endoscopy;  Laterality: N/A;   ESOPHAGOGASTRODUODENOSCOPY (EGD) WITH PROPOFOL N/A 11/29/2020   Procedure: ESOPHAGOGASTRODUODENOSCOPY (EGD) WITH PROPOFOL;  Surgeon: Mia Creek,  Rossie Muskrat, MD;  Location: [facility] ENDOSCOPY;  Service: Endoscopy;  Laterality: N/A;   FLEXIBLE BRONCHOSCOPY N/A 03/26/2018   Procedure: FLEXIBLE BRONCHOSCOPY;  Surgeon: Erin Fulling, MD;  Location: [facility] ORS;  Service: Cardiopulmonary;  Laterality: N/A;   IR REMOVAL TUN ACCESS W/ PORT W/O FL MOD SED  04/20/2022   KNEE ARTHROSCOPY Left    LEFT HEART CATHETERIZATION WITH CORONARY ANGIOGRAM N/A 05/03/2014    Procedure: LEFT HEART CATHETERIZATION WITH CORONARY ANGIOGRAM;  Surgeon: Micheline Chapman, MD;  Location: [facility] CATH LAB;  Service: Cardiovascular;  Laterality: N/A;   PERCUTANEOUS CORONARY STENT INTERVENTION (PCI-S) N/A 05/05/2014   Procedure: PERCUTANEOUS CORONARY STENT INTERVENTION (PCI-S);  Surgeon: Peter M Swaziland, MD;  Location: [facility] CATH LAB;  Service: Cardiovascular;  Laterality: N/A;   PORT-A-CATH REMOVAL  04/20/2022   PORTACATH PLACEMENT Left 04/04/2018   Procedure: INSERTION PORT-A-CATH;  Surgeon: Hulda Marin, MD;  Location: [facility] ORS;  Service: General;  Laterality: Left;   UPPER GI ENDOSCOPY      Current Medications: Current Meds  Medication Sig   acetaminophen (TYLENOL) 500 MG tablet Take 1,000 mg by mouth every 6 (six) hours as needed (for pain.).   albuterol (PROAIR HFA) 108 (90 Base) MCG/ACT inhaler Inhale 2-4 puffs into the lungs every 4 (four) hours as needed for wheezing or shortness of breath.   ANORO ELLIPTA 62.5-25 MCG/ACT AEPB USE 1 INHALATION DAILY   aspirin EC 81 MG tablet Take 81 mg by mouth daily.   atorvastatin (LIPITOR) 80 MG tablet TAKE 1 TABLET DAILY AT 6 P.M.   carvedilol (COREG) 6.25 MG tablet Take 1 tablet (6.25 mg total) by mouth 2 (two) times daily with a meal.   ezetimibe (ZETIA) 10 MG tablet Take 1 tablet (10 mg total) by mouth daily.   nitroGLYCERIN (NITROSTAT) 0.4 MG SL tablet Place 1 tablet (0.4 mg total) under the tongue every 5 (five) minutes as needed for chest pain. X 3 doses   omeprazole (PRILOSEC) 40 MG capsule Take 1 capsule (40 mg total) by mouth 2 (two) times daily.   tamsulosin (FLOMAX) 0.4 MG CAPS capsule Take 0.4 mg by mouth daily after supper.   [DISCONTINUED] finasteride (PROSCAR) 5 MG tablet Take 5 mg by mouth daily.   [DISCONTINUED] predniSONE (DELTASONE) 10 MG tablet Take 1 tablet (10 mg total) by mouth daily with breakfast. 7 days     Allergies:   Patient has no known allergies.   Social History   Socioeconomic History   Marital  status: Married    Spouse name: Not on file   Number of children: Not on file   Years of education: Not on file   Highest education level: Not on file  Occupational History   Not on file  Tobacco Use   Smoking status: Former    Current packs/day: 0.00    Average packs/day: 1 pack/day for 35.0 years (35.0 ttl pk-yrs)    Types: Cigarettes    Start date: 02/14/1976    Quit date: 02/14/2011    Years since quitting: 11.8   Smokeless tobacco: Never  Vaping Use   Vaping status: Never Used  Substance and Sexual Activity   Alcohol use: No   Drug use: No   Sexual activity: Not on file  Other Topics Concern   Not on file  Social History Narrative   The patient is married. He lives in [place]. He works full-time. He quit smoking in 2013.   Social Determinants of Health   Financial Resource Strain: Low Risk  (  10/06/2022)   Received from [facility]   Overall Financial Resource Strain (CARDIA)    Difficulty of Paying Living Expenses: Not hard at all  Food Insecurity: No Food Insecurity (10/06/2022)   Received from [facility]   Hunger Vital Sign    Worried About Running Out of Food in the Last Year: Never true    Ran Out of Food in the Last Year: Never true  Transportation Needs: No Transportation Needs (10/06/2022)   Received from [facility] - Transportation    In the past 12 months, has lack of transportation kept you from medical appointments or from getting medications?: No    Lack of Transportation (Non-Medical): No  Physical Activity: Not on file  Stress: Not on file  Social Connections: Not on file     Family History: The patient's family history includes Bone cancer in his paternal uncle; CAD (age of onset: 57) in his father; CAD (age of onset: 109) in his mother; COPD in his sister; Lung cancer in his brother. There is no history of Colon cancer or Breast cancer.  ROS:   Please see the history of present  illness.    All other systems reviewed and are negative.  EKGs/Labs/Other Studies Reviewed:    The following studies were reviewed today: EKG Interpretation Date/Time:  Monday December 11 2022 07:58:49 EDT Ventricular Rate:  54 PR Interval:  174 QRS Duration:  86 QT Interval:  426 QTC Calculation: 403 R Axis:   39  Text Interpretation: Sinus bradycardia Nonspecific T wave abnormality When compared with ECG of 27-Apr-2020 20:49, Criteria for Anteroseptal infarct are no longer Present Confirmed by Tonny Bollman [phone]) on 12/11/2022 8:11:41 AM    Recent Labs: 04/04/2022: ALT 18; BUN 11; Creatinine, Ser 1.04; Hemoglobin 15.5; Platelets 186; Potassium 3.8; Sodium 141  Recent Lipid Panel    Component Value Date/Time   CHOL 124 08/30/2020 0954   TRIG 95 08/30/2020 0954   HDL 31 (L) 08/30/2020 0954   CHOLHDL 4.0 08/30/2020 0954   CHOLHDL 3 07/31/2014 0736   VLDL 15.2 07/31/2014 0736   LDLCALC 75 08/30/2020 0954     Risk Assessment/Calculations:           Physical Exam:    VS:  BP (!) 130/90   Pulse (!) 54   Ht 5\' 6"  (1.676 m)   Wt 161 lb (73 kg)   SpO2 97%   BMI 25.99 kg/m     Wt Readings from Last 3 Encounters:  12/11/22 161 lb (73 kg)  10/03/22 157 lb 3.2 oz (71.3 kg)  07/17/22 159 lb (72.1 kg)     GEN:  Well nourished, well developed in no acute distress HEENT: Normal NECK: No JVD; No carotid bruits LYMPHATICS: No lymphadenopathy CARDIAC: RRR, no murmurs, rubs, gallops RESPIRATORY:  Clear to auscultation without rales, wheezing or rhonchi  ABDOMEN: Soft, non-tender, non-distended MUSCULOSKELETAL:  No edema; No deformity  SKIN: Warm and dry NEUROLOGIC:  Alert and oriented x 3 PSYCHIATRIC:  Normal affect   ASSESSMENT:    1. Coronary artery disease involving native coronary artery of native heart without angina pectoris   2. Essential hypertension   3. Mixed hyperlipidemia   4. Medication management    PLAN:    In order of problems listed  above:  The patient is stable without symptoms of angina.  He remains on a medical regimen of aspirin, high intensity statin drug with atorvastatin 80 mg, and carvedilol.  No medication changes were made today.  I will see him back in 1 year for follow-up evaluation. Diastolic blood pressure borderline but home readings have been in range.  Continue carvedilol 6.25 mg twice daily.  Low-sodium diet and continued exercise regimen discussed. Lipids above goal.  I reviewed his recent labs through care everywhere.  Total cholesterol is 145, LDL 87.  Currently treated with atorvastatin 80 mg daily.  Recommended Zetia 10 mg daily to work synergistically with atorvastatin.  Follow-up labs in 3 to 4 months.  He prefers to contact his primary care physician who is much closer to his home for follow-up labs.  Otherwise continue current medications and follow-up with me in 1 year unless problems arise in the interim.  Goal LDL cholesterol less than 70 mg/dL.     Medication Adjustments/Labs and Tests Ordered: Current medicines are reviewed at length with the patient today.  Concerns regarding medicines are outlined above.  Orders Placed This Encounter  Procedures   Lipid panel   Hepatic function panel   EKG 12-Lead   Meds ordered this encounter  Medications   ezetimibe (ZETIA) 10 MG tablet    Sig: Take 1 tablet (10 mg total) by mouth daily.    Dispense:  90 tablet    Refill:  3    Patient Instructions  Medication Instructions:  START Zetia/Ezetimibe 10mg  daily *If you need a refill on your cardiac medications before your next appointment, please call your pharmacy*   Lab Work: **Lipids, LFT's in 3 months by PCP** If you have labs (blood work) drawn today and your tests are completely normal, you will receive your results only by: MyChart Message (if you have MyChart) OR A paper copy in the mail If you have any lab test that is abnormal or we need to change your treatment, we will call you to  review the results.   Testing/Procedures: NONE   Follow-Up: At [facility], you and your health needs are our priority.  As part of our continuing mission to provide you with exceptional heart care, we have created designated Provider Care Teams.  These Care Teams include your primary Cardiologist (physician) and Advanced Practice Providers (APPs -  Physician Assistants and Nurse Practitioners) who all work together to provide you with the care you need, when you need it.  We recommend signing up for the patient portal called "MyChart".  Sign up information is provided on this After Visit Summary.  MyChart is used to connect with patients for Virtual Visits (Telemedicine).  Patients are able to view lab/test results, encounter notes, upcoming appointments, etc.  Non-urgent messages can be sent to your provider as well.   To learn more about what you can do with MyChart, go to [email or web address].    Your next appointment:   1 year(s)  Provider:   Tonny Bollman, MD        Signed, Tonny Bollman, MD  12/11/2022 12:17 PM    [facility]

## 2022-12-11 ENCOUNTER — Ambulatory Visit: Payer: Medicare Other | Attending: Cardiovascular Disease | Admitting: Cardiovascular Disease

## 2022-12-11 ENCOUNTER — Encounter: Payer: Self-pay | Admitting: Cardiovascular Disease

## 2022-12-11 VITALS — BP 130/90 | HR 54 | Ht 66.0 in | Wt 161.0 lb

## 2022-12-11 DIAGNOSIS — Z79899 Other long term (current) drug therapy: Secondary | ICD-10-CM

## 2022-12-11 DIAGNOSIS — I10 Essential (primary) hypertension: Secondary | ICD-10-CM | POA: Diagnosis not present

## 2022-12-11 DIAGNOSIS — E782 Mixed hyperlipidemia: Secondary | ICD-10-CM | POA: Diagnosis not present

## 2022-12-11 DIAGNOSIS — I2510 Atherosclerotic heart disease of native coronary artery without angina pectoris: Secondary | ICD-10-CM | POA: Diagnosis not present

## 2022-12-11 MED ORDER — EZETIMIBE 10 MG PO TABS: 10.0000 mg | ORAL_TABLET | Freq: Every day | ORAL | 3 refills | Status: DC

## 2022-12-11 NOTE — Patient Instructions (Signed)
Medication Instructions:  START Zetia/Ezetimibe 10mg  daily *If you need a refill on your cardiac medications before your next appointment, please call your pharmacy*   Lab Work: **Lipids, LFT's in 3 months by PCP** If you have labs (blood work) drawn today and your tests are completely normal, you will receive your results only by: MyChart Message (if you have MyChart) OR A paper copy in the mail If you have any lab test that is abnormal or we need to change your treatment, we will call you to review the results.   Testing/Procedures: NONE   Follow-Up: At [facility], you and your health needs are our priority.  As part of our continuing mission to provide you with exceptional heart care, we have created designated Provider Care Teams.  These Care Teams include your primary Cardiologist (physician) and Advanced Practice Providers (APPs -  Physician Assistants and Nurse Practitioners) who all work together to provide you with the care you need, when you need it.  We recommend signing up for the patient portal called "MyChart".  Sign up information is provided on this After Visit Summary.  MyChart is used to connect with patients for Virtual Visits (Telemedicine).  Patients are able to view lab/test results, encounter notes, upcoming appointments, etc.  Non-urgent messages can be sent to your provider as well.   To learn more about what you can do with MyChart, go to [email or web address].    Your next appointment:   1 year(s)  Provider:   Tonny Bollman, MD

## 2023-02-12 ENCOUNTER — Encounter: Payer: Self-pay | Admitting: Oncology

## 2023-02-12 NOTE — Telephone Encounter (Signed)
Called [facility] to see if they can add cbc,cmp, ldh to their lab appt on 12/10. Pending response

## 2023-03-02 ENCOUNTER — Encounter: Payer: Self-pay | Admitting: Cardiovascular Disease

## 2023-03-02 MED ORDER — NITROGLYCERIN 0.4 MG SL SUBL: SUBLINGUAL_TABLET | SUBLINGUAL | 6 refills | Status: DC

## 2023-04-03 ENCOUNTER — Ambulatory Visit
Admission: RE | Admit: 2023-04-03 | Discharge: 2023-04-03 | Disposition: A | Discharge status: Home / Self Care | Payer: Medicare Other | Source: Ambulatory Visit | Attending: Oncology | Admitting: Oncology

## 2023-04-03 DIAGNOSIS — C3431 Malignant neoplasm of lower lobe, right bronchus or lung: Secondary | ICD-10-CM | POA: Insufficient documentation

## 2023-04-03 MED ORDER — IOHEXOL 300 MG/ML  SOLN
80.0000 mL | Freq: Once | INTRAMUSCULAR | Status: AC | PRN
  Administered 2023-04-03: 80 mL via INTRAVENOUS

## 2023-04-05 ENCOUNTER — Telehealth: Payer: Self-pay | Admitting: *Deleted

## 2023-04-05 ENCOUNTER — Encounter: Payer: Self-pay | Admitting: Oncology

## 2023-04-05 NOTE — Telephone Encounter (Signed)
Called report  IMPRESSION: Stable posttreatment changes in the right lung as well as a small lung nodule without new lymph node enlargement or effusion.   However there is new nodular infiltrative opacity identified along the inferolateral right lung base. This has has a differential including an aggressive or neoplastic process. Please correlate with clinical presentation. A PET-CT scan may be useful as the next step in the workup based on the size of the area. Alternatively a short follow up CT in 3 months is an option.   Findings will be called to the ordering service by the Radiology physician assistant team     Electronically Signed   By: Karen Kays M.D.   On: 04/05/2023 11:44

## 2023-04-06 ENCOUNTER — Telehealth: Payer: Self-pay | Admitting: Oncology

## 2023-04-06 ENCOUNTER — Other Ambulatory Visit: Payer: Self-pay

## 2023-04-06 DIAGNOSIS — C3431 Malignant neoplasm of lower lobe, right bronchus or lung: Secondary | ICD-10-CM

## 2023-04-06 NOTE — Telephone Encounter (Signed)
Per MD secure chat "please obtain PET skull base to thigh ASAP. please reschedule his appt with me on 12/20 to be 1 weeks after PET "  PET scheduled and MD appt r/s to be 1 week after.   Called pt and left a vm about the updates and that everything can be seen in [place] as well.

## 2023-04-13 ENCOUNTER — Other Ambulatory Visit: Payer: Medicare Other

## 2023-04-13 ENCOUNTER — Ambulatory Visit: Payer: Medicare Other | Admitting: Oncology

## 2023-04-17 ENCOUNTER — Ambulatory Visit
Admission: RE | Admit: 2023-04-17 | Discharge: 2023-04-17 | Disposition: A | Discharge status: Home / Self Care | Payer: Medicare Other | Source: Ambulatory Visit | Attending: Oncology | Admitting: Oncology

## 2023-04-17 DIAGNOSIS — M19012 Primary osteoarthritis, left shoulder: Secondary | ICD-10-CM | POA: Insufficient documentation

## 2023-04-17 DIAGNOSIS — Z9221 Personal history of antineoplastic chemotherapy: Secondary | ICD-10-CM | POA: Insufficient documentation

## 2023-04-17 DIAGNOSIS — I700 Atherosclerosis of aorta: Secondary | ICD-10-CM | POA: Insufficient documentation

## 2023-04-17 DIAGNOSIS — R937 Abnormal findings on diagnostic imaging of other parts of musculoskeletal system: Secondary | ICD-10-CM | POA: Insufficient documentation

## 2023-04-17 DIAGNOSIS — C3431 Malignant neoplasm of lower lobe, right bronchus or lung: Secondary | ICD-10-CM | POA: Diagnosis present

## 2023-04-17 DIAGNOSIS — J984 Other disorders of lung: Secondary | ICD-10-CM | POA: Insufficient documentation

## 2023-04-17 DIAGNOSIS — J439 Emphysema, unspecified: Secondary | ICD-10-CM | POA: Diagnosis not present

## 2023-04-17 DIAGNOSIS — M19011 Primary osteoarthritis, right shoulder: Secondary | ICD-10-CM | POA: Insufficient documentation

## 2023-04-17 LAB — GLUCOSE, CAPILLARY: Glucose-Capillary: 85 mg/dL (ref 70–99)

## 2023-04-17 MED ORDER — FLUDEOXYGLUCOSE F - 18 (FDG) INJECTION
8.3000 | Freq: Once | INTRAVENOUS | Status: AC | PRN
  Administered 2023-04-17: 9.03 via INTRAVENOUS

## 2023-04-24 ENCOUNTER — Other Ambulatory Visit: Payer: Self-pay | Admitting: Cardiovascular Disease

## 2023-04-24 ENCOUNTER — Encounter: Payer: Self-pay | Admitting: Oncology

## 2023-04-26 ENCOUNTER — Encounter: Payer: Self-pay | Admitting: Oncology

## 2023-04-26 ENCOUNTER — Inpatient Hospital Stay: Payer: Medicare Other | Attending: Oncology | Admitting: Oncology

## 2023-04-26 VITALS — BP 147/89 | HR 94 | Temp 98.5°F | Resp 18 | Wt 151.9 lb

## 2023-04-26 DIAGNOSIS — Z08 Encounter for follow-up examination after completed treatment for malignant neoplasm: Secondary | ICD-10-CM | POA: Insufficient documentation

## 2023-04-26 DIAGNOSIS — Z85118 Personal history of other malignant neoplasm of bronchus and lung: Secondary | ICD-10-CM | POA: Insufficient documentation

## 2023-04-26 DIAGNOSIS — R748 Abnormal levels of other serum enzymes: Secondary | ICD-10-CM | POA: Insufficient documentation

## 2023-04-26 DIAGNOSIS — C3431 Malignant neoplasm of lower lobe, right bronchus or lung: Secondary | ICD-10-CM | POA: Diagnosis not present

## 2023-04-26 NOTE — Telephone Encounter (Signed)
 Created in error

## 2023-04-26 NOTE — Assessment & Plan Note (Addendum)
 Clinically patient is doing very well Labs reviewed and discussed with CT and PET scan are reviewed with patient.  No obvious sign of recurrence. Right lung base nodularity has no FDG activity likely reactive.     Recommend continue surveillance CT scan in 12 months.

## 2023-04-26 NOTE — Progress Notes (Signed)
 Hematology/Oncology Progress note [phone] [phone]      Patient Care Team: Rudolpho Norleen BIRCH, MD as PCP - General (Internal Medicine) Wonda Sharper, MD as PCP - Cardiology (Cardiology) Verdene Gills, RN as Registered Nurse Rudolpho Norleen BIRCH, MD as Consulting Physician (Internal Medicine) Lenn Aran, MD as Referring Physician (Radiation Oncology) Babara Call, MD as Consulting Physician (Oncology)  REFERRING PROVIDER: Dr.Fleming REASON FOR VISIT:  Follow-up for management of stage III non-small cell lung cancer   ASSESSMENT & PLAN:   Malignant neoplasm of lower lobe of right lung [facility]) Clinically patient is doing very well Labs reviewed and discussed with PET scan showed no hypermetabolic activity to suggest cancer recurrence.  Recommend continue surveillance CT scan in 6 months.  No orders of the defined types were placed in this encounter.  Follow-up in 12 months after CT scan. All questions were answered. The patient knows to call the clinic with any problems, questions or concerns.  Call Babara, MD, PhD [facility] Hematology Oncology 04/26/2023    HISTORY OF PRESENTING ILLNESS:  Timothy Yoder is a  68 y.o.  male with PMH listed below who was referred to me for evaluation of lung mass.  Patient is a former smoker quit smoking 7 years ago.  40-pack-year. Patient chest lung cancer screening on 03/11/2018. CT scan showed 2.4 cm right lower lobe nodule with a new necrotic.  Subcarinal lymph node. PET scan showed hypermetabolic right lower pulmonary nodule, consistent with primary bronchogenic carcinoma.  Fluid density subcarinal structure demonstrate no significant hypermetabolism.  Although this could represent an incidental benign lesion such as a cyst given the interval development since 02/24/2017, necrotic adenopathy cannot be excluded and tissue sampling should be considered.  # underwent biopsy via bronchoscopy.  Subcarinal lymph node biopsy  positive for non-small cell lung cancer, favoring poorly differentiated adenocarcinoma.  Stage IIIA T1c N2M0 #Mediport was placed by Dr. Volney  # #Elevated alkaline phosphatase. Patient has been evaluated by gastroenterology.  Liver biopsy showed mild sinusoidal dilation and congestion.  Minimal changes suggestive of hepatic process. Iron stain demonstrate minimal focal hepatocellular iron. I discussed with Dr.Wohl via secure chat about patient's pathology result.  No restriction of proceeding with immunotherapy.  Continue watch for the labs.   # Cancer Treatment 04/26/2018 -06/11/2018 Concurrent Chemotherapy [weekly Carbo and Taxol ] with RT.  07/10/2018 maintenance Durvalumab . Finished immunotherapy in May 2021  INTERVAL HISTORY Timothy Yoder is a 68 y.o. male who has above history reviewed by me today presents for follow up visit for management of stage III non-small cell lung cancer.   Patient reports feeling well.  He has no new complaints.  Denies weight loss, fever, chills, fatigue, night sweats.   04/05/2023 CT chest abdomen pelvis w contrast showed  Stable posttreatment changes in the right lung as well as a small lung nodule without new lymph node enlargement or effusion.   However there is new nodular infiltrative opacity identified along the inferolateral right lung base. This has has a differential including an aggressive or neoplastic process. Please correlate with clinical presentation.  04/17/23 PET showed 1. No signs of tracer avid recurrent or metastatic disease. 2. Bandlike density within the medial right lung base is anatomic with mild low level FDG uptake, favoring post treatment change. 3. The nodular density within the periphery of the right base noted on study from 04/03/2023 has nearly completely resolved in the interval without corresponding increased uptake on the PET images. This most likely represented an area  of inflammation or infection. 4. No significant tracer  uptake identified within 5 mm nodule along the posterior minor fissure. 5. Foci of increased uptake about bilateral humeral heads are favored to represent arthropathic inflammation. 6. Aortic Atherosclerosis (ICD10-I70.0) and Emphysema (ICD10-J43.9).   Review of Systems  Constitutional:  Negative for appetite change, chills, diaphoresis, fatigue, fever and unexpected weight change.  HENT:   Negative for hearing loss, lump/mass, nosebleeds, sore throat and voice change.   Eyes:  Negative for eye problems and icterus.  Respiratory:  Negative for chest tightness, cough, hemoptysis, shortness of breath and wheezing.   Cardiovascular:  Negative for chest pain and leg swelling.  Gastrointestinal:  Negative for abdominal distention, abdominal pain, blood in stool, diarrhea, nausea and rectal pain.  Endocrine: Negative for hot flashes.  Genitourinary:  Negative for bladder incontinence, difficulty urinating, dysuria, frequency, hematuria and nocturia.   Musculoskeletal:  Negative for arthralgias, back pain, flank pain, gait problem and myalgias.  Skin:  Negative for itching and rash.  Neurological:  Negative for dizziness, gait problem, headaches, light-headedness, numbness and seizures.  Hematological:  Negative for adenopathy. Does not bruise/bleed easily.  Psychiatric/Behavioral:  Negative for confusion and decreased concentration. The patient is not nervous/anxious.    MEDICAL HISTORY:  Past Medical History:  Diagnosis Date   BPH (benign prostatic hyperplasia)    CAD (coronary artery disease)    a. 04/2014 anterolateral STEMI/PCI: LM nl, LAD 25m (2.75x16 Promus DES, D1 50p, LCX nl, RI nl, OM1 100 small/chronic, RCA dominant, 20-41m, 70m/d (FFR 0.90->Med Rx), RPDA 50, RPLA 70, EF 60%.   COPD (chronic obstructive pulmonary disease) (HCC)    Dr Theotis   Former tobacco use    Gastroesophageal reflux disease    HTN (hypertension)    Hyperlipidemia    Hypokalemia    Malignant neoplasm of lung  (HCC) 04/01/2018   Myocardial infarction (HCC) 04/2014   Dr Ozell Fell   Stented coronary artery     SURGICAL HISTORY: Past Surgical History:  Procedure Laterality Date   CARDIAC CATHETERIZATION     CHOLECYSTECTOMY N/A 02/15/2016   Procedure: LAPAROSCOPIC CHOLECYSTECTOMY WITH INTRAOPERATIVE CHOLANGIOGRAM;  Surgeon: Larinda Unknown Sharps, MD;  Location: [facility] ORS;  Service: General;  Laterality: N/A;   COLONOSCOPY     COLONOSCOPY WITH PROPOFOL  N/A 08/02/2015   Procedure: COLONOSCOPY WITH PROPOFOL ;  Surgeon: Lamar ONEIDA Holmes, MD;  Location: [facility] ENDOSCOPY;  Service: Endoscopy;  Laterality: N/A;   COLONOSCOPY WITH PROPOFOL  N/A 11/29/2020   Procedure: COLONOSCOPY WITH PROPOFOL ;  Surgeon: Maryruth Ole ONEIDA, MD;  Location: [facility] ENDOSCOPY;  Service: Endoscopy;  Laterality: N/A;   CORONARY ANGIOPLASTY  04/2014   STENT   ENDOBRONCHIAL ULTRASOUND N/A 03/26/2018   Procedure: ENDOBRONCHIAL ULTRASOUND;  Surgeon: Isaiah Scrivener, MD;  Location: [facility] ORS;  Service: Cardiopulmonary;  Laterality: N/A;   ESOPHAGOGASTRODUODENOSCOPY (EGD) WITH PROPOFOL  N/A 08/02/2015   Procedure: ESOPHAGOGASTRODUODENOSCOPY (EGD) WITH PROPOFOL ;  Surgeon: Lamar ONEIDA Holmes, MD;  Location[facility] ENDOSCOPY;  Service: Endoscopy;  Laterality: N/A;   ESOPHAGOGASTRODUODENOSCOPY (EGD) WITH PROPOFOL  N/A 11/29/2020   Procedure: ESOPHAGOGASTRODUODENOSCOPY (EGD) WITH PROPOFOL ;  Surgeon: Maryruth Ole ONEIDA, MD;  Location: [facility] ENDOSCOPY;  Service: Endoscopy;  Laterality: N/A;   FLEXIBLE BRONCHOSCOPY N/A 03/26/2018   Procedure: FLEXIBLE BRONCHOSCOPY;  Surgeon: Isaiah Scrivener, MD;  Location: [facility] ORS;  Service: Cardiopulmonary;  Laterality: N/A;   IR REMOVAL TUN ACCESS W/ PORT W/O FL MOD SED  04/20/2022   KNEE ARTHROSCOPY Left    LEFT HEART CATHETERIZATION WITH CORONARY ANGIOGRAM N/A 05/03/2014   Procedure:  LEFT HEART CATHETERIZATION WITH CORONARY ANGIOGRAM;  Surgeon: Ozell JONETTA Fell, MD;  Location: [facility] CATH LAB;  Service: Cardiovascular;   Laterality: N/A;   PERCUTANEOUS CORONARY STENT INTERVENTION (PCI-S) N/A 05/05/2014   Procedure: PERCUTANEOUS CORONARY STENT INTERVENTION (PCI-S);  Surgeon: Peter M Jordan, MD;  Location: [facility] CATH LAB;  Service: Cardiovascular;  Laterality: N/A;   PORT-A-CATH REMOVAL  04/20/2022   PORTACATH PLACEMENT Left 04/04/2018   Procedure: INSERTION PORT-A-CATH;  Surgeon: Volney Lye, MD;  Location: [facility] ORS;  Service: General;  Laterality: Left;   UPPER GI ENDOSCOPY      SOCIAL HISTORY: Social History   Socioeconomic History   Marital status: Married    Spouse name: Not on file   Number of children: Not on file   Years of education: Not on file   Highest education level: Not on file  Occupational History   Not on file  Tobacco Use   Smoking status: Former    Current packs/day: 0.00    Average packs/day: 1 pack/day for 35.0 years (35.0 ttl pk-yrs)    Types: Cigarettes    Start date: 02/14/1976    Quit date: 02/14/2011    Years since quitting: 12.2   Smokeless tobacco: Never  Vaping Use   Vaping status: Never Used  Substance and Sexual Activity   Alcohol use: No   Drug use: No   Sexual activity: Not on file  Other Topics Concern   Not on file  Social History Narrative   The patient is married. He lives in [place]. He works full-time. He quit smoking in 2013.   Social Drivers of Corporate Investment Banker Strain: Low Risk  (10/06/2022)   Received from [facility]   Overall Financial Resource Strain (CARDIA)    Difficulty of Paying Living Expenses: Not hard at all  Food Insecurity: No Food Insecurity (10/06/2022)   Received from [facility]   Hunger Vital Sign    Worried About Running Out of Food in the Last Year: Never true    Ran Out of Food in the Last Year: Never true  Transportation Needs: No Transportation Needs (10/06/2022)   Received from [facility] - Transportation    In the past 12 months, has lack of  transportation kept you from medical appointments or from getting medications?: No    Lack of Transportation (Non-Medical): No  Physical Activity: Not on file  Stress: Not on file  Social Connections: Not on file  Intimate Partner Violence: Not on file    FAMILY HISTORY: Family History  Problem Relation Age of Onset   CAD Mother 10       Died of an MI   CAD Father 36       Died of a massive MI   COPD Sister    Lung cancer Brother    Bone cancer Paternal Uncle    Colon cancer Neg Hx    Breast cancer Neg Hx     ALLERGIES:  has no known allergies.  MEDICATIONS:  Current Outpatient Medications  Medication Sig Dispense Refill   acetaminophen  (TYLENOL ) 500 MG tablet Take 1,000 mg by mouth every 6 (six) hours as needed (for pain.).     albuterol  (PROAIR  HFA) 108 (90 Base) MCG/ACT inhaler Inhale 2-4 puffs into the lungs every 4 (four) hours as needed for wheezing or shortness of breath. 54 g 10   ANORO ELLIPTA  62.5-25 MCG/ACT AEPB USE 1 INHALATION DAILY 180 each 3  aspirin  EC 81 MG tablet Take 81 mg by mouth daily.     atorvastatin  (LIPITOR ) 80 MG tablet TAKE 1 TABLET BY MOUTH DAILY AT  6 PM 90 tablet 3   carvedilol  (COREG ) 6.25 MG tablet Take 1 tablet (6.25 mg total) by mouth 2 (two) times daily with a meal. 180 tablet 1   ezetimibe  (ZETIA ) 10 MG tablet Take 1 tablet (10 mg total) by mouth daily. 90 tablet 3   famotidine  (PEPCID ) 40 MG tablet Take 40 mg by mouth daily.     omeprazole  (PRILOSEC) 40 MG capsule Take 1 capsule (40 mg total) by mouth 2 (two) times daily. 180 capsule 1   tamsulosin  (FLOMAX ) 0.4 MG CAPS capsule Take 0.4 mg by mouth daily after supper.     nitroGLYCERIN  (NITROSTAT ) 0.4 MG SL tablet Dissolve 1 tablet under the tongue every 5 minutes as needed for chest pain. Max of 3 doses, then 911. (Patient not taking: Reported on 04/26/2023) 25 tablet 6   No current facility-administered medications for this visit.   Facility-Administered Medications Ordered in Other  Visits  Medication Dose Route Frequency Provider Last Rate Last Admin   heparin  lock flush 100 unit/mL  500 Units Intravenous Once Babara Call, MD       sodium chloride  flush (NS) 0.9 % injection 10 mL  10 mL Intravenous PRN Babara Call, MD   10 mL at 09/26/21 1125     PHYSICAL EXAMINATION: ECOG PERFORMANCE STATUS: 1 - Symptomatic but completely ambulatory Vitals:   04/26/23 1445 04/26/23 1447  BP: (!) 163/91 (!) 147/89  Pulse: (!) 57 94  Resp: 18   Temp: 98.5 F (36.9 C)   SpO2:  98%   Filed Weights   04/26/23 1445  Weight: 151 lb 14.4 oz (68.9 kg)    Physical Exam Constitutional:      General: He is not in acute distress. HENT:     Head: Normocephalic and atraumatic.  Eyes:     General: No scleral icterus. Cardiovascular:     Rate and Rhythm: Normal rate and regular rhythm.     Heart sounds: Normal heart sounds.  Pulmonary:     Effort: Pulmonary effort is normal. No respiratory distress.     Breath sounds: No wheezing.  Abdominal:     General: Bowel sounds are normal. There is no distension.     Palpations: Abdomen is soft.  Musculoskeletal:        General: No deformity. Normal range of motion.     Cervical back: Normal range of motion and neck supple.  Skin:    General: Skin is warm and dry.     Findings: No erythema or rash.  Neurological:     Mental Status: He is alert and oriented to person, place, and time. Mental status is at baseline.     Cranial Nerves: No cranial nerve deficit.     Coordination: Coordination normal.  Psychiatric:        Mood and Affect: Mood normal.      LABORATORY DATA:  I have reviewed the data as listed Lab Results  Component Value Date   WBC 6.5 04/04/2022   HGB 15.5 04/04/2022   HCT 46.1 04/04/2022   MCV 93.5 04/04/2022   PLT 186 04/04/2022   No results for input(s): NA, K, CL, CO2, GLUCOSE, BUN, CREATININE, CALCIUM , GFRNONAA, GFRAA, PROT, ALBUMIN, AST, ALT, ALKPHOS, BILITOT, BILIDIR, IBILI  in the last 8760 hours. Iron/TIBC/Ferritin/ %Sat    Component Value Date/Time   IRON  41 (L) 11/21/2018 0856   TIBC 260 11/21/2018 0856   FERRITIN 150 11/21/2018 0856   IRONPCTSAT 16 (L) 11/21/2018 0856    RADIOGRAPHIC STUDIES: I have personally reviewed the radiological images as listed and agreed with the findings in the report.  NM PET Image Restag (PS) Skull Base To Thigh Result Date: 04/26/2023 CLINICAL DATA:  Subsequent treatment strategy for right lower lobe lung cancer. EXAM: NUCLEAR MEDICINE PET SKULL BASE TO THIGH TECHNIQUE: 9.03 mCi F-18 FDG was injected intravenously. Full-ring PET imaging was performed from the skull base to thigh after the radiotracer. CT data was obtained and used for attenuation correction and anatomic localization. Fasting blood glucose: 85 mg/dl COMPARISON:  CT chest, abdomen and pelvis from 04/03/2023 FINDINGS: Mediastinal blood pool activity: SUV max 2.26 Liver activity: SUV max NA NECK: No hypermetabolic lymph nodes in the neck. Incidental CT findings: None CHEST: No tracer avid supraclavicular, axillary, mediastinal, or hilar lymph nodes. There is no abnormal tracer uptake corresponding to the chronic bandlike area of architectural distortion in the medial right lung base. This area measures 3.7 cm in length with SUV max 2.4 a and is favored to represent an area of chronic post treatment change, image 125/4. The nodular density within the periphery of the right base noted on study from 04/03/2023 has nearly completely resolved in the interval without corresponding increased uptake on the PET images. This most likely represented an area of inflammation or infection. No significant tracer uptake identified within 5 mm nodule along the posterior minor fissure, image 66/6 No tracer avid pulmonary nodule or mass identified at this time. Calcified granuloma identified within the left upper lobe. Incidental CT findings: Emphysema. Biapical pleuroparenchymal scarring. Aortic  atherosclerosis and coronary artery calcification. ABDOMEN/PELVIS: No abnormal hypermetabolic activity within the liver, pancreas, adrenal glands, or spleen. No hypermetabolic lymph nodes in the abdomen or pelvis. Incidental CT findings: Aortic atherosclerosis. Cholecystectomy. Sigmoid diverticulosis without signs of acute diverticulitis. SKELETON: No focal hypermetabolic activity to suggest skeletal metastasis. Incidental CT findings: Foci of increased uptake about bilateral humeral heads are favored to represent arthro pathic inflammation. IMPRESSION: 1. No signs of tracer avid recurrent or metastatic disease. 2. Bandlike density within the medial right lung base is anatomic with mild low level FDG uptake, favoring post treatment change. 3. The nodular density within the periphery of the right base noted on study from 04/03/2023 has nearly completely resolved in the interval without corresponding increased uptake on the PET images. This most likely represented an area of inflammation or infection. 4. No significant tracer uptake identified within 5 mm nodule along the posterior minor fissure. 5. Foci of increased uptake about bilateral humeral heads are favored to represent arthropathic inflammation. 6. Aortic Atherosclerosis (ICD10-I70.0) and Emphysema (ICD10-J43.9). Electronically Signed   By: Waddell Calk M.D.   On: 04/26/2023 15:23   CT CHEST ABDOMEN PELVIS W CONTRAST Result Date: 04/05/2023 CLINICAL DATA:  Right lower lobe lung cancer. Prior chemotherapy radiation and immunotherapy. Assess treatment response. * Tracking Code: BO * EXAM: CT CHEST, ABDOMEN, AND PELVIS WITH CONTRAST TECHNIQUE: Multidetector CT imaging of the chest, abdomen and pelvis was performed following the standard protocol during bolus administration of intravenous contrast. RADIATION DOSE REDUCTION: This exam was performed according to the departmental dose-optimization program which includes automated exposure control, adjustment  of the mA and/or kV according to patient size and/or use of iterative reconstruction technique. CONTRAST:  80mL OMNIPAQUE  IOHEXOL  300 MG/ML  SOLN COMPARISON:  CT scan 04/04/2022 and older FINDINGS: CT CHEST  FINDINGS Cardiovascular: Scattered vascular calcifications seen along the thoracic aorta with some noncalcified plaque as well. No dissection or aneurysm formation. Coronary artery calcifications are seen. The heart is nonenlarged. No significant pericardial effusion. Mediastinum/Nodes: Grossly normal caliber thoracic esophagus. Preserved small thyroid  gland. No specific abnormal lymph node enlargement identified in the axillary regions, hilum or mediastinum. Lungs/Pleura: Centrilobular emphysematous changes are identified. There are some bilateral apical pleural thickening with some scarring and fibrotic changes. Bilateral calcified lung nodules identified consistent with old granulomatous disease. Bandlike nodular opacity medially in the right lower lobe is stable today on series 3, image 129 extending to the hilum. This could be posttreatment related. There is small nodule in the right lung measuring 5 mm on series 3, image 95 which is unchanged from previous. There is 1 new parenchymal nodular opacity along the extreme inferior right lower lobe on series 3, image 140. In the axial plane this measures 18 by 14 mm. On coronal imaging proximally 7 mm. This has a differential. No pneumothorax or effusion. Musculoskeletal: Scattered degenerative changes along the spine. CT ABDOMEN PELVIS FINDINGS Hepatobiliary: No focal liver abnormality is seen. Status post cholecystectomy. No biliary dilatation. Patent portal vein. Pancreas: Unremarkable. No pancreatic ductal dilatation or surrounding inflammatory changes. Spleen: Normal in size without focal abnormality. Adrenals/Urinary Tract: No adrenal hemorrhage or renal injury identified. Bladder is unremarkable. Stomach/Bowel: On this non oral contrast exam, the large  bowel has a normal course and caliber with diffuse stool. Left-sided colonic diverticulosis. Normal appendix in the right hemipelvis. Stomach and small bowel are nondilated. Prominent duodenal diverticula. Vascular/Lymphatic: Aortic atherosclerosis. No enlarged abdominal or pelvic lymph nodes. Reproductive: Prostate is unremarkable. Other: No free air or free fluid. Musculoskeletal: Scattered degenerative changes. Pars defects at L5 with trace listhesis. IMPRESSION: Stable posttreatment changes in the right lung as well as a small lung nodule without new lymph node enlargement or effusion. However there is new nodular infiltrative opacity identified along the inferolateral right lung base. This has has a differential including an aggressive or neoplastic process. Please correlate with clinical presentation. A PET-CT scan may be useful as the next step in the workup based on the size of the area. Alternatively a short follow up CT in 3 months is an option. Findings will be called to the ordering service by the Radiology physician assistant team Electronically Signed   By: Ranell Bring M.D.   On: 04/05/2023 11:44     COPD, continue albuterol  inhaler  Follow up in 6 months.  Zelphia Cap, MD, PhD 04/26/23

## 2023-04-27 ENCOUNTER — Telehealth: Payer: Self-pay

## 2023-04-27 ENCOUNTER — Other Ambulatory Visit: Payer: Self-pay

## 2023-04-27 DIAGNOSIS — C3431 Malignant neoplasm of lower lobe, right bronchus or lung: Secondary | ICD-10-CM

## 2023-04-27 NOTE — Telephone Encounter (Signed)
 Detailed VM with follow up plan left on Carolyn's phone.   Please schedule:  CT CAP in 1 year  MD a week after CT

## 2023-04-27 NOTE — Telephone Encounter (Signed)
-----   Message from Rickard Patience sent at 04/26/2023  8:00 PM EST ----- Please arrange him to repeat CT chest abdomen pelvis w contrast in 1 year prior to MD  Thanks.

## 2023-09-13 ENCOUNTER — Other Ambulatory Visit: Payer: Self-pay | Admitting: Cardiovascular Disease

## 2023-10-01 ENCOUNTER — Encounter: Payer: Self-pay | Admitting: Cardiovascular Disease

## 2023-10-01 MED ORDER — NITROGLYCERIN 0.4 MG SL SUBL: SUBLINGUAL_TABLET | SUBLINGUAL | 4 refills | Status: AC

## 2023-10-15 ENCOUNTER — Ambulatory Visit: Admitting: Internal Medicine

## 2023-10-16 ENCOUNTER — Ambulatory Visit: Admitting: Internal Medicine

## 2023-10-16 ENCOUNTER — Encounter: Payer: Self-pay | Admitting: Internal Medicine

## 2023-10-16 VITALS — BP 130/80 | HR 71 | Temp 97.9°F | Ht 66.0 in | Wt 161.4 lb

## 2023-10-16 DIAGNOSIS — J449 Chronic obstructive pulmonary disease, unspecified: Secondary | ICD-10-CM | POA: Diagnosis not present

## 2023-10-16 DIAGNOSIS — C3431 Malignant neoplasm of lower lobe, right bronchus or lung: Secondary | ICD-10-CM

## 2023-10-16 MED ORDER — TRELEGY ELLIPTA 200-62.5-25 MCG/ACT IN AEPB: 1.0000 | INHALATION_SPRAY | Freq: Every day | RESPIRATORY_TRACT | Status: DC

## 2023-10-16 NOTE — Patient Instructions (Signed)
 Plan to start Trelegy inhaler 1 puff once a day Please rinse mouth after use  Avoid Allergens and Irritants Avoid secondhand smoke Avoid SICK contacts Recommend  Masking  when appropriate Recommend Keep up-to-date with vaccinations  Follow-up CT chest in December 2025

## 2023-10-16 NOTE — Progress Notes (Signed)
 TEST/EVENTS :  03/11/2018 CT chest lung screen 1. New 2.4 cm right lower lobe nodule with a new necrotic appearing subcarinal lymph node. Lung-RADS 4B, suspicious. Additional imaging evaluation or consultation with Pulmonology or Thoracic Surgery recommended. These results will be called to the ordering clinician or representative by the Radiologist Assistant, and communication documented in the PACS or zVision Dashboard. 2. Aortic atherosclerosis (ICD10-170.0). Coronary arterycalcification.3.  Emphysema (ICD10-J43.9).   03/13/2018 PET scan # Hypermetabolic right lower lobe pulmonary nodule, consistent with primary bronchogenic carcinoma. Fluid density subcarinal structure demonstrates no significant hypermetabolism. Although this could represent an incidental benign lesion such as a bronchogenic cyst, given interval development since 02/24/2017, necrotic adenopathy cannot be excluded and tissue sampling should be considered.  CT chest 03/24/21 s/p tx changes in RLL , no evidence of recurrent or metastaic disease .  Hepatic steatosis.  Moderately large duodenal diverticulum. Sigmoid diverticular changes without signs of adjacent inflammation. Three-vessel coronary artery calcification.  # Cancer Treatment 04/26/2018 -06/11/2018 Concurrent Chemotherapy [weekly Carbo and Taxol ] with RT.  07/10/2018 maintenance Durvalumab . Finished immunotherapy in May 2021  10/06/2019: FEV1 was 56%, ratio 53, FVC 79%, no significant bronchodilator response.  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 diagnosed in December 2019   Follow-up COPD Moderate based on pulmonary function test Previously on Anoro daily Patient states that patient has increased work of breathing shortness of breath intermittent wheezing Using his albuterol  more  frequently Lets plan to switch to Trelegy with inhaled corticosteroids long-acting beta agonist and long-acting muscarinic No previous infections in the last 1 year No COPD exacerbations No hospitalizations in the last year  No exacerbation at this time No evidence of heart failure at this time No evidence or signs of infection at this time No respiratory distress No fevers, chills, nausea, vomiting, diarrhea No evidence of lower extremity edema No evidence hemoptysis   Follow-up lung cancer Patient has a history of lung cancer diagnosed in 2019.  Underwent concurrent chemotherapy and radiation.  Followed by immunotherapy which was completed in May 2021.  Serial CT follow-up has shown no evidence of recurrent or metastatic disease.  Last CT chest was in March 24, 2021. Has planned CT chest in June 2023 with Oncology .  Patient had several scans in 2024 most recent PET scan in December 2024 No significant findings follow-up CT chest to be scheduled for December 2025  10/16/2023 Ambulating pulse oximetry in the office did not show any significant hypoxia         No Known Allergies  Immunization History  Administered Date(s) Administered   Influenza,inj,Quad PF,6+ Mos 05/24/2016, 01/27/2019   Influenza,inj,quad, With Preservative 02/25/2018   Influenza-Unspecified 02/26/2017, 02/26/2018, 03/24/2022   PFIZER(Purple Top)SARS-COV-2 Vaccination 06/05/2019, 07/01/2019, 04/26/2020   Pneumococcal Conjugate-13 07/06/2020   Tdap 06/05/2014   Zoster, Live 05/25/2010    Past Medical History:  Diagnosis Date   BPH (benign prostatic hyperplasia)    CAD (coronary artery disease)    a. 04/2014 anterolateral STEMI/PCI: LM nl, LAD 109m (2.75x16 Promus DES, D1 50p, LCX nl, RI nl, OM1 100 small/chronic, RCA dominant, 20-91m, 5m/d (FFR 0.90->Med Rx), RPDA 50, RPLA 70, EF 60%.   COPD (chronic obstructive pulmonary disease) (HCC)    Dr Theotis   Former tobacco use    Gastroesophageal  reflux disease    HTN (hypertension)  Hyperlipidemia    Hypokalemia    Malignant neoplasm of lung (HCC) 04/01/2018   Myocardial infarction (HCC) 04/2014   Dr Ozell Fell   Stented coronary artery     Tobacco History: Social History   Tobacco Use  Smoking Status Former   Current packs/day: 0.00   Average packs/day: 1 pack/day for 35.0 years (35.0 ttl pk-yrs)   Types: Cigarettes   Start date: 02/14/1976   Quit date: 02/14/2011   Years since quitting: 12.6  Smokeless Tobacco Never   Counseling given: Not Answered   Outpatient Medications Prior to Visit  Medication Sig Dispense Refill   acetaminophen  (TYLENOL ) 500 MG tablet Take 1,000 mg by mouth every 6 (six) hours as needed (for pain.).     albuterol  (PROAIR  HFA) 108 (90 Base) MCG/ACT inhaler Inhale 2-4 puffs into the lungs every 4 (four) hours as needed for wheezing or shortness of breath. 54 g 10   ANORO ELLIPTA  62.5-25 MCG/ACT AEPB USE 1 INHALATION DAILY 180 each 3   aspirin  EC 81 MG tablet Take 81 mg by mouth daily.     atorvastatin  (LIPITOR ) 80 MG tablet TAKE 1 TABLET BY MOUTH DAILY AT  6 PM 90 tablet 3   carvedilol  (COREG ) 6.25 MG tablet TAKE 1 TABLET BY MOUTH TWICE  DAILY WITH MEALS 180 tablet 0   ezetimibe  (ZETIA ) 10 MG tablet Take 1 tablet (10 mg total) by mouth daily. 90 tablet 3   famotidine  (PEPCID ) 40 MG tablet Take 40 mg by mouth daily.     nitroGLYCERIN  (NITROSTAT ) 0.4 MG SL tablet Dissolve 1 tablet under the tongue every 5 minutes as needed for chest pain. Max of 3 doses, then 911. 25 tablet 4   omeprazole  (PRILOSEC) 40 MG capsule Take 1 capsule (40 mg total) by mouth 2 (two) times daily. 180 capsule 1   tamsulosin  (FLOMAX ) 0.4 MG CAPS capsule Take 0.4 mg by mouth daily after supper.     Facility-Administered Medications Prior to Visit  Medication Dose Route Frequency Provider Last Rate Last Admin   heparin  lock flush 100 unit/mL  500 Units Intravenous Once Babara Call, MD       sodium chloride  flush (NS) 0.9  % injection 10 mL  10 mL Intravenous PRN Babara Call, MD   10 mL at 09/26/21 1125   BP 130/80 (BP Location: Right Arm, Patient Position: Sitting, Cuff Size: Normal)   Pulse 71   Temp 97.9 F (36.6 C) (Oral)   Ht 5' 6 (1.676 m)   Wt 161 lb 6.4 oz (73.2 kg)   SpO2 96%   BMI 26.05 kg/m       Review of Systems: Gen:  Denies  fever, sweats, chills weight loss  HEENT: Denies blurred vision, double vision, ear pain, eye pain, hearing loss, nose bleeds, sore throat Cardiac:  No dizziness, chest pain or heaviness, chest tightness,edema, No JVD Resp:   No cough, -sputum production, -shortness of breath,-wheezing, -hemoptysis,  Other:  All other systems negative   Physical Examination:   General Appearance: No distress  EYES PERRLA, EOM intact.   NECK Supple, No JVD Pulmonary: normal breath sounds, No wheezing.  CardiovascularNormal S1,S2.  No m/r/g.   Abdomen: Benign, Soft, non-tender. Neurology UE/LE 5/5 strength, no focal deficits Ext pulses intact, cap refill intact ALL OTHER ROS ARE NEGATIVE    Assessment & Plan:   68 year old pleasant white male seen today for follow-up assessment of COPD and history of lung cancer  Assessment of COPD Start Trelegy inhaler Rinse  mouth after use No exacerbation at this time No evidence of heart failure at this time No evidence or signs of infection at this time No respiratory distress No fevers, chills, nausea, vomiting, diarrhea No evidence of lower extremity edema No evidence hemoptysis  Anoro does not work anymore-tried and failed We will consider repeat PFTs in the next 6 months Continue albuterol  as needed Avoid Allergens and Irritants Avoid secondhand smoke Avoid SICK contacts Recommend  Masking  when appropriate Recommend Keep up-to-date with vaccinations     Malignant neoplasm of lower lobe of right lung (HCC) Serial CT chest shows no evidence of metastatic or recurrent disease.   Follow-up CT scans with oncology as  scheduled Repeat CT chest in December 2025 pending    MEDICATION ADJUSTMENTS/LABS AND TESTS ORDERED: Trelegy 200 1 puff once a day Rinse mouth after use Albuterol  as needed CT chest follow-up with oncology December 2025 Avoid Allergens and Irritants Avoid secondhand smoke Avoid SICK contacts Recommend  Masking  when appropriate Recommend Keep up-to-date with vaccinations   CURRENT MEDICATIONS REVIEWED AT LENGTH WITH PATIENT TODAY   Patient  satisfied with Plan of action and management. All questions answered  Follow up 3 months  Total time spent 42 minutes   Timothy Yoder, M.D.  [facility]  Medical Director [facility] Medical Director [facility] Cardio-Pulmonary Department

## 2023-10-18 ENCOUNTER — Encounter: Payer: Self-pay | Admitting: Internal Medicine

## 2023-10-18 DIAGNOSIS — J449 Chronic obstructive pulmonary disease, unspecified: Secondary | ICD-10-CM

## 2023-11-09 MED ORDER — TRELEGY ELLIPTA 200-62.5-25 MCG/ACT IN AEPB: 1.0000 | INHALATION_SPRAY | Freq: Every day | RESPIRATORY_TRACT | 6 refills | Status: DC

## 2023-11-09 NOTE — Addendum Note (Signed)
 Addended by: Berlynn Warsame J on: 11/09/2023 10:06 AM   Modules accepted: Orders

## 2024-01-17 ENCOUNTER — Other Ambulatory Visit: Payer: Self-pay | Admitting: Cardiovascular Disease

## 2024-02-05 ENCOUNTER — Encounter: Payer: Self-pay | Admitting: Internal Medicine

## 2024-02-05 ENCOUNTER — Ambulatory Visit: Admitting: Internal Medicine

## 2024-02-05 VITALS — BP 120/80 | HR 61 | Temp 98.1°F | Ht 68.0 in | Wt 157.0 lb

## 2024-02-05 DIAGNOSIS — J449 Chronic obstructive pulmonary disease, unspecified: Secondary | ICD-10-CM

## 2024-02-05 DIAGNOSIS — C3431 Malignant neoplasm of lower lobe, right bronchus or lung: Secondary | ICD-10-CM

## 2024-02-05 NOTE — Patient Instructions (Addendum)
 Continue Trelegy as prescribed Rinse mouth after use  Avoid Allergens and Irritants Avoid secondhand smoke Avoid SICK contacts Recommend  Masking  when appropriate Recommend Keep up-to-date with vaccinations  Follow-up CT chest with oncology

## 2024-02-05 NOTE — Progress Notes (Signed)
 TEST/EVENTS :  03/11/2018 CT chest lung screen 1. New 2.4 cm right lower lobe nodule with a new necrotic appearing subcarinal lymph node. Lung-RADS 4B, suspicious. Additional imaging evaluation or consultation with Pulmonology or Thoracic Surgery recommended. These results will be called to the ordering clinician or representative by the Radiologist Assistant, and communication documented in the PACS or zVision Dashboard. 2. Aortic atherosclerosis (ICD10-170.0). Coronary arterycalcification.3.  Emphysema (ICD10-J43.9).   03/13/2018 PET scan # Hypermetabolic right lower lobe pulmonary nodule, consistent with primary bronchogenic carcinoma. Fluid density subcarinal structure demonstrates no significant hypermetabolism. Although this could represent an incidental benign lesion such as a bronchogenic cyst, given interval development since 02/24/2017, necrotic adenopathy cannot be excluded and tissue sampling should be considered.  CT chest 03/24/21 s/p tx changes in RLL , no evidence of recurrent or metastaic disease .  Hepatic steatosis.  Moderately large duodenal diverticulum. Sigmoid diverticular changes without signs of adjacent inflammation. Three-vessel coronary artery calcification.  # Cancer Treatment 04/26/2018 -06/11/2018 Concurrent Chemotherapy [weekly Carbo and Taxol ] with RT.  07/10/2018 maintenance Durvalumab . Finished immunotherapy in May 2021  10/06/2019: FEV1 was 56%, ratio 53, FVC 79%, no significant bronchodilator response.  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 diagnosed in December 2019   Follow-up COPD Moderate based on pulmonary function test Previously on Anoro daily Patient states that patient has increased work of breathing shortness of breath intermittent wheezing Using his albuterol  more  frequently Lets plan to switch to Trelegy with inhaled corticosteroids long-acting beta agonist and long-acting muscarinic No previous infections in the last 1 year No COPD exacerbations No hospitalizations in the last year   No exacerbation at this time No evidence of heart failure at this time No evidence or signs of infection at this time No respiratory distress No fevers, chills, nausea, vomiting, diarrhea No evidence of lower extremity edema No evidence hemoptysis    Follow-up lung cancer Patient has a history of lung cancer diagnosed in 2019.  Underwent concurrent chemotherapy and radiation.  Followed by immunotherapy which was completed in May 2021.  Serial CT follow-up has shown no evidence of recurrent or metastatic disease.  Last CT chest was in March 24, 2021. Has planned CT chest in June 2023 with Oncology .  Patient had several scans in 2024 most recent PET scan in December 2024 No significant findings follow-up CT chest to be scheduled for December 2025  09/2023 Ambulating pulse oximetry in the office did not show any significant hypoxia         No Known Allergies  Immunization History  Administered Date(s) Administered   Influenza,inj,Quad PF,6+ Mos 05/24/2016, 01/27/2019   Influenza,inj,quad, With Preservative 02/25/2018   Influenza-Unspecified 02/26/2017, 02/26/2018, 03/24/2022   PFIZER(Purple Top)SARS-COV-2 Vaccination 06/05/2019, 07/01/2019, 04/26/2020   Pneumococcal Conjugate-13 07/06/2020   Tdap 06/05/2014   Zoster, Live 05/25/2010    Past Medical History:  Diagnosis Date   BPH (benign prostatic hyperplasia)    CAD (coronary artery disease)    a. 04/2014 anterolateral STEMI/PCI: LM nl, LAD 64m (2.75x16 Promus DES, D1 50p, LCX nl, RI nl, OM1 100 small/chronic, RCA dominant, 20-52m, 48m/d (FFR 0.90->Med Rx), RPDA 50, RPLA 70, EF 60%.   COPD (chronic obstructive pulmonary disease) (HCC)    Dr Theotis   Former tobacco use    Gastroesophageal  reflux disease  HTN (hypertension)    Hyperlipidemia    Hypokalemia    Malignant neoplasm of lung (HCC) 04/01/2018   Myocardial infarction (HCC) 04/2014   Dr Ozell Fell   Stented coronary artery     Tobacco History: Social History   Tobacco Use  Smoking Status Former   Current packs/day: 0.00   Average packs/day: 1 pack/day for 35.0 years (35.0 ttl pk-yrs)   Types: Cigarettes   Start date: 02/14/1976   Quit date: 02/14/2011   Years since quitting: 12.9  Smokeless Tobacco Never   Counseling given: Not Answered   Outpatient Medications Prior to Visit  Medication Sig Dispense Refill   acetaminophen  (TYLENOL ) 500 MG tablet Take 1,000 mg by mouth every 6 (six) hours as needed (for pain.).     albuterol  (PROAIR  HFA) 108 (90 Base) MCG/ACT inhaler Inhale 2-4 puffs into the lungs every 4 (four) hours as needed for wheezing or shortness of breath. 54 g 10   ANORO ELLIPTA  62.5-25 MCG/ACT AEPB USE 1 INHALATION DAILY 180 each 3   aspirin  EC 81 MG tablet Take 81 mg by mouth daily.     atorvastatin  (LIPITOR ) 80 MG tablet TAKE 1 TABLET BY MOUTH DAILY AT  6 PM 90 tablet 3   carvedilol  (COREG ) 6.25 MG tablet TAKE 1 TABLET BY MOUTH TWICE  DAILY WITH MEALS 180 tablet 0   ezetimibe  (ZETIA ) 10 MG tablet TAKE 1 TABLET BY MOUTH DAILY 30 tablet 0   famotidine  (PEPCID ) 40 MG tablet Take 40 mg by mouth daily.     Fluticasone-Umeclidin-Vilant (TRELEGY ELLIPTA ) 200-62.5-25 MCG/ACT AEPB Inhale 1 puff into the lungs daily. Rinse mouth thoroughly after every use. 28 each 6   nitroGLYCERIN  (NITROSTAT ) 0.4 MG SL tablet Dissolve 1 tablet under the tongue every 5 minutes as needed for chest pain. Max of 3 doses, then 911. 25 tablet 4   omeprazole  (PRILOSEC) 40 MG capsule Take 1 capsule (40 mg total) by mouth 2 (two) times daily. 180 capsule 1   tamsulosin  (FLOMAX ) 0.4 MG CAPS capsule Take 0.4 mg by mouth daily after supper.     Facility-Administered Medications Prior to Visit  Medication Dose Route  Frequency Provider Last Rate Last Admin   heparin  lock flush 100 unit/mL  500 Units Intravenous Once Babara Call, MD       sodium chloride  flush (NS) 0.9 % injection 10 mL  10 mL Intravenous PRN Babara Call, MD   10 mL at 09/26/21 1125   BP 120/80   Pulse 61   Temp 98.1 F (36.7 C)   Ht 5' 8 (1.727 m)   Wt 157 lb (71.2 kg)   SpO2 96%   BMI 23.87 kg/m      Review of Systems: Gen:  Denies  fever, sweats, chills weight loss  HEENT: Denies blurred vision, double vision, ear pain, eye pain, hearing loss, nose bleeds, sore throat Cardiac:  No dizziness, chest pain or heaviness, chest tightness,edema, No JVD Resp:   No cough, -sputum production, -shortness of breath,-wheezing, -hemoptysis,  Other:  All other systems negative   Physical Examination:   General Appearance: No distress  EYES PERRLA, EOM intact.   NECK Supple, No JVD Pulmonary: normal breath sounds, No wheezing.  CardiovascularNormal S1,S2.  No m/r/g.   Abdomen: Benign, Soft, non-tender. Neurology UE/LE 5/5 strength, no focal deficits Ext pulses intact, cap refill intact ALL OTHER ROS ARE NEGATIVE     Assessment & Plan:   68 year old pleasant white male seen today for follow-up assessment of COPD  and history of lung cancer  Assessment of COPD Continue  Trelegy inhaler Rinse mouth after use No exacerbation at this time No evidence of heart failure at this time No evidence or signs of infection at this time No respiratory distress No fevers, chills, nausea, vomiting, diarrhea No evidence of lower extremity edema No evidence hemoptysis    Anoro does not work anymore-tried and failed We will consider repeat PFTs in the next 6 months Continue albuterol  as needed Avoid Allergens and Irritants Avoid secondhand smoke Avoid SICK contacts Recommend  Masking  when appropriate Recommend Keep up-to-date with vaccinations   Malignant neoplasm of lower lobe of right lung (HCC) Serial CT chest shows no evidence of  metastatic or recurrent disease.   Follow-up CT scans with oncology as scheduled Repeat CT chest in December 2025 pending    MEDICATION ADJUSTMENTS/LABS AND TESTS ORDERED: Trelegy 200 Rinse mouth after use Albuterol  as needed CT chest follow-up with oncology December 2025 Avoid Allergens and Irritants Avoid secondhand smoke Avoid SICK contacts Recommend  Masking  when appropriate Recommend Keep up-to-date with vaccinations  CURRENT MEDICATIONS REVIEWED AT LENGTH WITH PATIENT TODAY   Patient  satisfied with Plan of action and management. All questions answered   Follow up 6 months   I spent a total of 45 minutes dedicated to the care of this patient on the date of this encounter to include pre-visit review of records, face-to-face time with the patient discussing conditions above, post visit ordering of testing, clinical documentation with the electronic health record, making appropriate referrals as documented, and communicating necessary information to the patient's healthcare team.    The Patient requires high complexity decision making for assessment and support, frequent evaluation and titration of therapies, application of advanced monitoring technologies and extensive interpretation of multiple databases.  Patient satisfied with Plan of action and management. All questions answered    Nickolas Alm Cellar, M.D.  [facility]monary [facility] Critical [facility] Medicine  Medical Director [facility] [place]

## 2024-02-07 ENCOUNTER — Telehealth: Payer: Self-pay | Admitting: Internal Medicine

## 2024-02-07 NOTE — Telephone Encounter (Signed)
 Copied from CRM #[id number]. Topic: General - Other >> Feb 07, 2024  9:51 AM Rilla NOVAK wrote: Reason for CRM: Patient saw Dr Isaiah on 10/14 and paid a $40 copay at check in.  Patient states his wife paid the co-pay online.  Please call patient @ [phone]. Left message to call back to discuss the copay.

## 2024-02-11 ENCOUNTER — Other Ambulatory Visit: Payer: Self-pay | Admitting: Cardiovascular Disease

## 2024-02-19 ENCOUNTER — Other Ambulatory Visit: Payer: Self-pay | Admitting: Cardiovascular Disease

## 2024-03-31 ENCOUNTER — Other Ambulatory Visit: Payer: Self-pay | Admitting: Cardiovascular Disease

## 2024-04-05 ENCOUNTER — Other Ambulatory Visit: Payer: Self-pay | Admitting: Cardiovascular Disease

## 2024-04-25 ENCOUNTER — Inpatient Hospital Stay: Payer: Medicare Other | Attending: Oncology

## 2024-04-25 ENCOUNTER — Ambulatory Visit
Admission: RE | Admit: 2024-04-25 | Discharge: 2024-04-25 | Disposition: A | Discharge status: Home / Self Care | Payer: Medicare Other | Source: Ambulatory Visit | Attending: Oncology | Admitting: Oncology

## 2024-04-25 DIAGNOSIS — C3431 Malignant neoplasm of lower lobe, right bronchus or lung: Secondary | ICD-10-CM

## 2024-04-25 LAB — CMP (CANCER CENTER ONLY)
ALT: 18 U/L (ref 0–44)
AST: 21 U/L (ref 15–41)
Albumin: 4.1 g/dL (ref 3.5–5.0)
Alkaline Phosphatase: 98 U/L (ref 38–126)
Anion gap: 11 (ref 5–15)
BUN: 11 mg/dL (ref 8–23)
CO2: 26 mmol/L (ref 22–32)
Calcium: 9.1 mg/dL (ref 8.9–10.3)
Chloride: 105 mmol/L (ref 98–111)
Creatinine: 1.01 mg/dL (ref 0.61–1.24)
GFR, Estimated: >60 mL/min
Glucose, Bld: 99 mg/dL (ref 70–99)
Potassium: 4 mmol/L (ref 3.5–5.1)
Sodium: 141 mmol/L (ref 135–145)
Total Bilirubin: 0.4 mg/dL (ref 0.0–1.2)
Total Protein: 6.3 g/dL — ABNORMAL LOW (ref 6.5–8.1)

## 2024-04-25 LAB — POCT I-STAT CREATININE: Creatinine, Ser: 1.1 mg/dL (ref 0.61–1.24)

## 2024-04-25 MED ORDER — IOHEXOL 300 MG/ML  SOLN
100.0000 mL | Freq: Once | INTRAMUSCULAR | Status: AC | PRN
  Administered 2024-04-25: 100 mL via INTRAVENOUS

## 2024-04-27 ENCOUNTER — Other Ambulatory Visit: Payer: Self-pay | Admitting: Cardiovascular Disease

## 2024-05-06 ENCOUNTER — Encounter: Payer: Self-pay | Admitting: Oncology

## 2024-05-06 ENCOUNTER — Inpatient Hospital Stay: Payer: Medicare Other | Admitting: Oncology

## 2024-05-06 DIAGNOSIS — C3431 Malignant neoplasm of lower lobe, right bronchus or lung: Secondary | ICD-10-CM

## 2024-05-06 NOTE — Assessment & Plan Note (Signed)
 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  Sub solid irregular lesion in the right upper lobe, 16mm ? new scarring  Repeat CT in 3 months.

## 2024-05-06 NOTE — Progress Notes (Signed)
 " Hematology/Oncology Progress note [phone] [phone]      Patient Care Team: Rudolpho Norleen BIRCH, MD as PCP - General (Internal Medicine) Wonda Sharper, MD as PCP - Cardiology (Cardiology) Rudolpho Norleen BIRCH, MD as Consulting Physician (Internal Medicine) Lenn Aran, MD as Referring Physician (Radiation Oncology) Babara Call, MD as Consulting Physician (Oncology)  REFERRING PROVIDER: Dr.Fleming REASON FOR VISIT:  Follow-up for management of stage III non-small cell lung cancer   ASSESSMENT & PLAN:   Malignant neoplasm of lower lobe of right lung [facility]) Clinically patient is doing very well Labs reviewed and discussed with 05/05/2024 CT chest showed  Stable treatment related fibrosis in the right lower lobe.  Sub solid irregular lesion in the right upper lobe, 16mm ? new scarring  Repeat CT in 3 months.    Orders Placed This Encounter  Procedures   CT Chest Wo Contrast    Standing Status:   Future    Expected Date:   08/04/2024    Expiration Date:   05/06/2025    Preferred imaging location?:   [facility]   Follow-up in 3 months after CT scan. All questions were answered. The patient knows to call the clinic with any problems, questions or concerns.  Call Babara, MD, PhD [facility] Hematology Oncology 05/06/2024    HISTORY OF PRESENTING ILLNESS:  [facility] is a  69 y.o.  male with PMH listed below who was referred to me for evaluation of lung mass.  Patient is a former smoker quit smoking 7 years ago.  40-pack-year. Patient chest lung cancer screening on 03/11/2018. CT scan showed 2.4 cm right lower lobe nodule with a new necrotic.  Subcarinal lymph node. PET scan showed hypermetabolic right lower pulmonary nodule, consistent with primary bronchogenic carcinoma.  Fluid density subcarinal structure demonstrate no significant hypermetabolism.  Although this could represent an incidental benign lesion such as a cyst given the interval  development since 02/24/2017, necrotic adenopathy cannot be excluded and tissue sampling should be considered.  # underwent biopsy via bronchoscopy.  Subcarinal lymph node biopsy positive for non-small cell lung cancer, favoring poorly differentiated adenocarcinoma.  Stage IIIA T1c N2M0 #Mediport was placed by Dr. Volney  # #Elevated alkaline phosphatase. Patient has been evaluated by gastroenterology.  Liver biopsy showed mild sinusoidal dilation and congestion.  Minimal changes suggestive of hepatic process. Iron stain demonstrate minimal focal hepatocellular iron. I discussed with Dr.Wohl via secure chat about patient's pathology result.  No restriction of proceeding with immunotherapy.  Continue watch for the labs.   # Cancer Treatment 04/26/2018 -06/11/2018 Concurrent Chemotherapy [weekly Carbo and Taxol ] with RT.  07/10/2018 maintenance Durvalumab . Finished immunotherapy in May 2021  04/05/2023 CT chest abdomen pelvis w contrast showed  Stable posttreatment changes in the right lung as well as a small lung nodule without new lymph node enlargement or effusion.  04/17/23 PET showed 1. No signs of tracer avid recurrent or metastatic disease. 2. Bandlike density within the medial right lung base is anatomic with mild low level FDG uptake, favoring post treatment change. 3. The nodular density within the periphery of the right base noted on study from 04/03/2023 has nearly completely resolved in the interval without corresponding increased uptake on the PET images. This most likely represented an area of inflammation or infection. 4. No significant tracer uptake identified within 5 mm nodule along the posterior minor fissure. 5. Foci of increased uptake about bilateral humeral heads are favored to represent arthropathic inflammation. 6. Aortic Atherosclerosis (ICD10-I70.0) and  Emphysema (ICD10-J43.9).  INTERVAL HISTORY [name] is a 69 y.o. male who has above history reviewed by me today  presents for follow up visit for management of stage III non-small cell lung cancer.   Patient reports feeling well.  He has no new complaints.  Denies weight loss, fever, chills, fatigue, night sweats.   04/25/2024 CT chest abdomen pelvis w contrast  1. Stable treatment related fibrosis in the right lower lobe. No findings to suggest recurrent tumor. 2. New sub solid irregular lesion in the right upper lobe. This measures a maximum of 16 mm. It may reflect an area of new scarring change given the underlying emphysema but could not exclude the possibility of new adenocarcinoma. Recommend either close CT surveillance (repeat noncontrast chest CT in 3-4 months) or PET-CT. 3. No mediastinal or hilar mass or adenopathy. 4. No findings for abdominal/pelvic metastatic disease. 5. Status post cholecystectomy. No biliary dilatation. 6. Diffuse colonic diverticulosis without findings for acute diverticulitis     Review of Systems  Constitutional:  Negative for appetite change, chills, diaphoresis, fatigue, fever and unexpected weight change.  HENT:   Negative for hearing loss, lump/mass, nosebleeds, sore throat and voice change.   Eyes:  Negative for eye problems and icterus.  Respiratory:  Negative for chest tightness, cough, hemoptysis, shortness of breath and wheezing.   Cardiovascular:  Negative for chest pain and leg swelling.  Gastrointestinal:  Negative for abdominal distention, abdominal pain, blood in stool, diarrhea, nausea and rectal pain.  Endocrine: Negative for hot flashes.  Genitourinary:  Negative for bladder incontinence, difficulty urinating, dysuria, frequency, hematuria and nocturia.   Musculoskeletal:  Negative for arthralgias, back pain, flank pain, gait problem and myalgias.  Skin:  Negative for itching and rash.  Neurological:  Negative for dizziness, gait problem, headaches, light-headedness, numbness and seizures.  Hematological:  Negative for adenopathy. Does not  bruise/bleed easily.  Psychiatric/Behavioral:  Negative for confusion and decreased concentration. The patient is not nervous/anxious.    MEDICAL HISTORY:  Past Medical History:  Diagnosis Date   BPH (benign prostatic hyperplasia)    CAD (coronary artery disease)    a. 04/2014 anterolateral STEMI/PCI: LM nl, LAD 8m (2.75x16 Promus DES, D1 50p, LCX nl, RI nl, OM1 100 small/chronic, RCA dominant, 20-48m, 83m/d (FFR 0.90->Med Rx), RPDA 50, RPLA 70, EF 60%.   COPD (chronic obstructive pulmonary disease) (HCC)    Dr Theotis   Former tobacco use    Gastroesophageal reflux disease    HTN (hypertension)    Hyperlipidemia    Hypokalemia    Malignant neoplasm of lung (HCC) 04/01/2018   Myocardial infarction (HCC) 04/2014   Dr Ozell Fell   Stented coronary artery     SURGICAL HISTORY: Past Surgical History:  Procedure Laterality Date   CARDIAC CATHETERIZATION     CHOLECYSTECTOMY N/A 02/15/2016   Procedure: LAPAROSCOPIC CHOLECYSTECTOMY WITH INTRAOPERATIVE CHOLANGIOGRAM;  Surgeon: Larinda Unknown Sharps, MD;  Location: [facility] ORS;  Service: General;  Laterality: N/A;   COLONOSCOPY     COLONOSCOPY WITH PROPOFOL  N/A 08/02/2015   Procedure: COLONOSCOPY WITH PROPOFOL ;  Surgeon: Lamar ONEIDA Holmes, MD;  Location: [facility];  Service: Endoscopy;  Laterality: N/A;   COLONOSCOPY WITH PROPOFOL  N/A 11/29/2020   Procedure: COLONOSCOPY WITH PROPOFOL ;  Surgeon: Maryruth Ole ONEIDA, MD;  Location: [facility] ENDOSCOPY;  Service: Endoscopy;  Laterality: N/A;   CORONARY ANGIOPLASTY  04/2014   STENT   ENDOBRONCHIAL ULTRASOUND N/A 03/26/2018   Procedure: ENDOBRONCHIAL ULTRASOUND;  Surgeon: Isaiah Scrivener, MD;  Location: [facility]  ORS;  Service: Cardiopulmonary;  Laterality: N/A;   ESOPHAGOGASTRODUODENOSCOPY (EGD) WITH PROPOFOL  N/A 08/02/2015   Procedure: ESOPHAGOGASTRODUODENOSCOPY (EGD) WITH PROPOFOL ;  Surgeon: Lamar ONEIDA Holmes, MD;  Location: [facility] ENDOSCOPY;  Service: Endoscopy;  Laterality: N/A;    ESOPHAGOGASTRODUODENOSCOPY (EGD) WITH PROPOFOL  N/A 11/29/2020   Procedure: ESOPHAGOGASTRODUODENOSCOPY (EGD) WITH PROPOFOL ;  Surgeon: Maryruth Ole ONEIDA, MD;  Location: [facility] ENDOSCOPY;  Service: Endoscopy;  Laterality: N/A;   FLEXIBLE BRONCHOSCOPY N/A 03/26/2018   Procedure: FLEXIBLE BRONCHOSCOPY;  Surgeon: Isaiah Scrivener, MD;  Location: [facility] ORS;  Service: Cardiopulmonary;  Laterality: N/A;   IR REMOVAL TUN ACCESS W/ PORT W/O FL MOD SED  04/20/2022   KNEE ARTHROSCOPY Left    LEFT HEART CATHETERIZATION WITH CORONARY ANGIOGRAM N/A 05/03/2014   Procedure: LEFT HEART CATHETERIZATION WITH CORONARY ANGIOGRAM;  Surgeon: Ozell JONETTA Fell, MD;  Location: [facility] CATH LAB;  Service: Cardiovascular;  Laterality: N/A;   PERCUTANEOUS CORONARY STENT INTERVENTION (PCI-S) N/A 05/05/2014   Procedure: PERCUTANEOUS CORONARY STENT INTERVENTION (PCI-S);  Surgeon: Peter M Jordan, MD;  Location: [facility] CATH LAB;  Service: Cardiovascular;  Laterality: N/A;   PORT-A-CATH REMOVAL  04/20/2022   PORTACATH PLACEMENT Left 04/04/2018   Procedure: INSERTION PORT-A-CATH;  Surgeon: Volney Lye, MD;  Location: [facility] ORS;  Service: General;  Laterality: Left;   UPPER GI ENDOSCOPY     VASECTOMY  1984    SOCIAL HISTORY: Social History   Socioeconomic History   Marital status: Married    Spouse name: Not on file   Number of children: Not on file   Years of education: Not on file   Highest education level: Not on file  Occupational History   Not on file  Tobacco Use   Smoking status: Former    Current packs/day: 0.00    Average packs/day: 1 pack/day for 35.0 years (35.0 ttl pk-yrs)    Types: Cigarettes    Start date: 02/14/1976    Quit date: 02/14/2011    Years since quitting: 13.2   Smokeless tobacco: Never  Vaping Use   Vaping status: Never Used  Substance and Sexual Activity   Alcohol use: No   Drug use: No   Sexual activity: Not on file  Other Topics Concern   Not on file  Social History Narrative   The patient is  married. He lives in [place]. He works full-time. He quit smoking in 2013.   Social Drivers of Health   Tobacco Use: Medium Risk (05/06/2024)   Patient History    Smoking Tobacco Use: Former    Smokeless Tobacco Use: Never    Passive Exposure: Not on file  Financial Resource Strain: Low Risk  (02/04/2024)   Received from [facility]   Overall Financial Resource Strain (CARDIA)    Difficulty of Paying Living Expenses: Not hard at all  Food Insecurity: No Food Insecurity (02/04/2024)   Received from [facility]   Epic    Within the past 12 months, you worried that your food would run out before you got the money to buy more.: Never true    Within the past 12 months, the food you bought just didn't last and you didn't have money to get more.: Never true  Transportation Needs: No Transportation Needs (02/04/2024)   [facility] - [facility]    In the past 12 months, has lack of transportation kept you from medical appointments or from getting medications?: No    Lack of Transportation (Non-Medical): No  Physical  Activity: Not on file  Stress: Not on file  Social Connections: Not on file  Intimate Partner Violence: Not on file  Depression (EYV7-0): Not on file  Alcohol Screen: Not on file  Housing: Low Risk  (02/04/2024)   Received from [facility]   Epic    In the last 12 months, was there a time when you were not able to pay the mortgage or rent on time?: No    In the past 12 months, how many times have you moved where you were living?: 0    At any time in the past 12 months, were you homeless or living in a shelter (including now)?: No  Utilities: Not At Risk (02/04/2024)   Received from [facility]   Epic    In the past 12 months has the electric, gas, oil, or water company threatened to shut off services in your home?: No  Health Literacy: Not on file     FAMILY HISTORY: Family History  Problem Relation Age of Onset   CAD Mother 66       Died of an MI   CAD Father 74       Died of a massive MI   COPD Sister    Lung cancer Brother    Cancer Brother    Bone cancer Paternal Uncle    Cancer Brother    Colon cancer Neg Hx    Breast cancer Neg Hx     ALLERGIES:  has no known allergies.  MEDICATIONS:  Current Outpatient Medications  Medication Sig Dispense Refill   acetaminophen  (TYLENOL ) 500 MG tablet Take 1,000 mg by mouth every 6 (six) hours as needed (for pain.).     albuterol  (PROAIR  HFA) 108 (90 Base) MCG/ACT inhaler Inhale 2-4 puffs into the lungs every 4 (four) hours as needed for wheezing or shortness of breath. 54 g 10   aspirin  EC 81 MG tablet Take 81 mg by mouth daily.     atorvastatin  (LIPITOR ) 80 MG tablet TAKE 1 TABLET BY MOUTH DAILY AT  6 PM 90 tablet 0   carvedilol  (COREG ) 6.25 MG tablet TAKE 1 TABLET BY MOUTH TWICE  DAILY WITH MEALS 180 tablet 3   ezetimibe  (ZETIA ) 10 MG tablet TAKE 1 TABLET BY MOUTH DAILY 15 tablet 0   famotidine  (PEPCID ) 40 MG tablet Take 40 mg by mouth daily.     Fluticasone-Umeclidin-Vilant (TRELEGY ELLIPTA ) 200-62.5-25 MCG/ACT AEPB Inhale 1 puff into the lungs daily. Rinse mouth thoroughly after every use. 28 each 6   nitroGLYCERIN  (NITROSTAT ) 0.4 MG SL tablet Dissolve 1 tablet under the tongue every 5 minutes as needed for chest pain. Max of 3 doses, then 911. 25 tablet 4   omeprazole  (PRILOSEC) 40 MG capsule Take 1 capsule (40 mg total) by mouth 2 (two) times daily. 180 capsule 1   tamsulosin  (FLOMAX ) 0.4 MG CAPS capsule Take 0.4 mg by mouth daily after supper.     ANORO ELLIPTA  62.5-25 MCG/ACT AEPB USE 1 INHALATION DAILY (Patient not taking: Reported on 05/06/2024) 180 each 3   No current facility-administered medications for this visit.   Facility-Administered Medications Ordered in Other Visits  Medication Dose Route Frequency Provider Last Rate Last Admin   heparin  lock flush 100  unit/mL  500 Units Intravenous Once Kynzi Levay, MD       sodium chloride  flush (NS) 0.9 % injection 10 mL  10 mL Intravenous PRN Babara Call, MD   10 mL at 09/26/21 1125  PHYSICAL EXAMINATION: ECOG PERFORMANCE STATUS: 1 - Symptomatic but completely ambulatory Vitals:   05/06/24 0953 05/06/24 1003  BP: (!) 169/95 (!) 172/110  Pulse: 84   Resp: 18   Temp: (!) 96.3 F (35.7 C)   SpO2: 98%    Filed Weights   05/06/24 0953  Weight: 156 lb 6.4 oz (70.9 kg)    Physical Exam Constitutional:      General: He is not in acute distress. HENT:     Head: Normocephalic and atraumatic.  Eyes:     General: No scleral icterus. Cardiovascular:     Rate and Rhythm: Normal rate and regular rhythm.     Heart sounds: Normal heart sounds.  Pulmonary:     Effort: Pulmonary effort is normal. No respiratory distress.     Breath sounds: No wheezing.  Abdominal:     General: Bowel sounds are normal. There is no distension.     Palpations: Abdomen is soft.  Musculoskeletal:        General: No deformity. Normal range of motion.     Cervical back: Normal range of motion and neck supple.  Skin:    General: Skin is warm and dry.     Findings: No erythema or rash.  Neurological:     Mental Status: He is alert and oriented to person, place, and time. Mental status is at baseline.     Cranial Nerves: No cranial nerve deficit.     Coordination: Coordination normal.  Psychiatric:        Mood and Affect: Mood normal.      LABORATORY DATA:  I have reviewed the data as listed Lab Results  Component Value Date   WBC 6.5 04/04/2022   HGB 15.5 04/04/2022   HCT 46.1 04/04/2022   MCV 93.5 04/04/2022   PLT 186 04/04/2022   Recent Labs    04/25/24 0751 04/25/24 0901  NA 141  --   K 4.0  --   CL 105  --   CO2 26  --   GLUCOSE 99  --   BUN 11  --   CREATININE 1.01 1.10  CALCIUM  9.1  --   GFRNONAA >60  --   PROT 6.3*  --   ALBUMIN 4.1  --   AST 21  --   ALT 18  --   ALKPHOS 98  --    BILITOT 0.4  --    Iron/TIBC/Ferritin/ %Sat    Component Value Date/Time   IRON 41 (L) 11/21/2018 0856   TIBC 260 11/21/2018 0856   FERRITIN 150 11/21/2018 0856   IRONPCTSAT 16 (L) 11/21/2018 0856    RADIOGRAPHIC STUDIES: I have personally reviewed the radiological images as listed and agreed with the findings in the report.  CT CHEST ABDOMEN PELVIS W CONTRAST Result Date: 05/05/2024 CLINICAL DATA:  Restaging lung cancer.  * Tracking Code: BO * EXAM: CT CHEST, ABDOMEN, AND PELVIS WITH CONTRAST TECHNIQUE: Multidetector CT imaging of the chest, abdomen and pelvis was performed following the standard protocol during bolus administration of intravenous contrast. RADIATION DOSE REDUCTION: This exam was performed according to the departmental dose-optimization program which includes automated exposure control, adjustment of the mA and/or kV according to patient size and/or use of iterative reconstruction technique. CONTRAST:  OMNIPAQUE  IOHEXOL  300 MG/ML  SOLN COMPARISON:  PET-CT 04/17/2023 FINDINGS: CT CHEST FINDINGS Cardiovascular: The heart is normal in size. No pericardial effusion. The aorta is normal in caliber. Stable scattered atherosclerotic calcifications. The branch vessels are patent. Stable  coronary artery calcifications. Mediastinum/Nodes: No mediastinal or hilar mass or lymphadenopathy the. The esophagus is unremarkable. The thyroid  gland is normal. Lungs/Pleura: New sub solid irregular lesion in the right upper lobe. This measures a maximum of 16 mm. It may reflect an area of new scarring change given the underlying emphysema could not exclude the possibility new adenocarcinoma. Stable scarring changes in the left upper lobe medially. Stable band of right lower lobe scarring type changes and bronchiectasis consistent with treatment related fibrosis. No new or progressive findings to suggest recurrent tumor in this area. Stable nearby subpleural scarring and calcified granuloma. On the  prior CT scan there was a irregular nodular lesion at the right lung base which has resolved and was likely an area of infiltrate or atelectasis. There is a stable 4 mm nodule at the right lung base just above the right hemidiaphragm on image 135/4 which is stable and considered benign. 2 mm nodule at the left lung base on image 140/4 is also stable and considered benign. No pleural effusions or pleural lesions. Musculoskeletal: No chest wall mass, supraclavicular or axillary adenopathy. The bony thorax is intact. No worrisome bone lesions. CT ABDOMEN PELVIS FINDINGS Hepatobiliary: No hepatic lesions or intrahepatic biliary dilatation. The gallbladder is surgically absent. No common bile duct dilatation. Pancreas: No mass, inflammation or ductal dilatation. Spleen: Normal in size without focal abnormality. Adrenals/Urinary Tract: The adrenal glands are normal. No renal lesions, renal calculi or hydronephrosis. No collecting system abnormalities on the delayed images. The bladder is unremarkable. Stomach/Bowel: The stomach, duodenum and small bowel unremarkable. No inflammatory changes or obstructive findings. The terminal ileum and appendix are stable diffuse colonic diverticulosis most severely involving the sigmoid colon. No findings for acute diverticulitis. No colonic mass or obstructive process. Vascular/Lymphatic: Stable atherosclerotic calcifications involving the aorta and iliac arteries and branch vessels but no aneurysm or dissection. The major venous structures are patent. No mesenteric or retroperitoneal mass or adenopathy. No pelvic adenopathy. Reproductive: The prostate gland and seminal vesicles are unremarkable. Other: No ascites or abdominal wall hernia. No subcutaneous lesions. Small bilateral inguinal hernias containing fat. Musculoskeletal: No significant bony findings. No worrisome lytic or sclerotic bone lesions to suggest metastatic disease. 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 IMPRESSION: 1. Stable treatment related fibrosis in the right lower lobe. No findings to suggest recurrent tumor. 2. New sub solid irregular lesion in the right upper lobe. This measures a maximum of 16 mm. It may reflect an area of new scarring change given the underlying emphysema but could not exclude the possibility of new adenocarcinoma. Recommend either close CT surveillance (repeat noncontrast chest CT in 3-4 months) or PET-CT. 3. No mediastinal or hilar mass or adenopathy. 4. No findings for abdominal/pelvic metastatic disease. 5. Status post cholecystectomy. No biliary dilatation. 6. Diffuse colonic diverticulosis without findings for acute diverticulitis. Aortic Atherosclerosis (ICD10-I70.0) and Emphysema (ICD10-J43.9). Electronically Signed   By: MYRTIS Stammer M.D.   On: 05/05/2024 14:30   "

## 2024-05-07 ENCOUNTER — Telehealth: Payer: Self-pay

## 2024-05-07 ENCOUNTER — Other Ambulatory Visit (HOSPITAL_COMMUNITY): Payer: Self-pay

## 2024-05-07 NOTE — Telephone Encounter (Signed)
*  Pulm  Pharmacy Patient Advocate Encounter   Received notification from Fax that prior authorization for Trelegy Ellipta  200-62.5-25MCG/ACT aerosol powder   is required/requested.   Insurance verification completed.   The patient is insured through [facility].   Per test claim: Refill too soon. PA is not needed at this time. Medication was filled 05/04/2024. Next eligible fill date is 05/27/2024.   KEY: [id number]

## 2024-05-13 ENCOUNTER — Encounter: Payer: Self-pay | Admitting: Oncology

## 2024-05-13 NOTE — Telephone Encounter (Signed)
 Looks like pt is already established with Dr. Isaiah, Did we need to send a new referral? Please advise

## 2024-05-13 NOTE — Telephone Encounter (Signed)
 Hi Dr. Isaiah, [employer]  pt had a CT scan on 1/2 showed new sub solid irregular lesion in the right upper lobe. Dr. Babara will repeat at CT chest in April. Is there any other recommendation that you would like to provide?

## 2024-05-13 NOTE — Telephone Encounter (Signed)
 Our office will be calling pt to get him scheduled to discuss this further with Dr. Isaiah in person. NFN.

## 2024-05-15 ENCOUNTER — Telehealth: Payer: Self-pay

## 2024-05-15 ENCOUNTER — Ambulatory Visit: Admitting: Internal Medicine

## 2024-05-15 ENCOUNTER — Encounter: Payer: Self-pay | Admitting: Internal Medicine

## 2024-05-15 VITALS — BP 130/80 | HR 77 | Temp 97.9°F | Ht 68.0 in | Wt 158.0 lb

## 2024-05-15 DIAGNOSIS — C3431 Malignant neoplasm of lower lobe, right bronchus or lung: Secondary | ICD-10-CM

## 2024-05-15 DIAGNOSIS — Z87891 Personal history of nicotine dependence: Secondary | ICD-10-CM | POA: Diagnosis not present

## 2024-05-15 DIAGNOSIS — J449 Chronic obstructive pulmonary disease, unspecified: Secondary | ICD-10-CM

## 2024-05-15 DIAGNOSIS — R918 Other nonspecific abnormal finding of lung field: Secondary | ICD-10-CM | POA: Insufficient documentation

## 2024-05-15 NOTE — Progress Notes (Signed)
 "   TEST/EVENTS :  03/11/2018 CT chest lung screen 2.4 cm right lower lobe nodule with a new necrotic appearing subcarinal lymph node.    03/13/2018 PET scan Hypermetabolic right lower lobe pulmonary nodule, consistent with primary bronchogenic carcinoma. Fluid density subcarinal structure demonstrates no significant hypermetabolism. Although this could represent an incidental benign lesion such as a bronchogenic cyst, given interval development since 02/24/2017, necrotic adenopathy cannot be excluded and tissue sampling should be considered. lung cancer adenocarcinoma IIIa DX 2019 EN[facility] Jorje Vanatta  Cancer Treatment 04/26/2018 -06/11/2018 Concurrent Chemotherapy [weekly Carbo and Taxol ] with RT.  07/10/2018 maintenance Durvalumab . Finished immunotherapy in May 2021 10/06/2019: FEV1 was 56%, ratio 53, FVC 79%, no significant bronchodilator response.  DLCO was 41%    CT chest 03/24/21 s/p tx changes in RLL , no evidence of recurrent or metastaic disease .  Hepatic steatosis.  Moderately large duodenal diverticulum. Sigmoid diverticular changes without signs of adjacent inflammation. Three-vessel coronary artery calcification.    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  Underwent concurrent chemotherapy and  radiation.  Followed by immunotherapy which was completed in May 2021.   Serial CT follow-up has shown no evidence of recurrent or metastatic disease.   Last CT chest was in March 24, 2021. Has planned CT chest in June 2023 with Oncology .  Patient had several scans in 2024 most recent PET scan in December 2024 No significant findings follow-up CT chest to be scheduled for December 2025  CT chest January 2026 new finding in the right upper lobe subsolid mass This measures a maximum of 16 mm. It may reflect an area of new scarring change given the underlying emphysema could not exclude the possibility new adenocarcinoma.   The Risks and Benefits of the Bronchoscopy procedure with robotic bronchoscopy were explained to patient  I have discussed the risk for Acute Bleeding, increased chance of Infection, increased chance of Respiratory Failure and Cardiac Arrest, increased chance of pneumothorax and collapsed lung, as well as increased Stroke and Death.  I have also explained to avoid all types of NSAIDs 1 week prior to procedure date  to decrease chance of bleeding, and also to avoid food and drinks the midnight prior to procedure.  The procedure consists of a video camera with a light source to be placed and inserted  into the lungs to  look for abnormal tissue and to obtain tissue samples by using needle and biopsy tools.  The patient understand the risks and benefits and have agreed to proceed with procedure.  We will plan a schedule for June 02, 2024 Monday 8 AM   09/2023 Ambulating pulse oximetry in the office did not show any significant hypoxia   No Known Allergies  Immunization History  Administered Date(s) Administered   Influenza,inj,Quad PF,6+ Mos 05/24/2016, 01/27/2019   Influenza,inj,quad, With Preservative 02/25/2018  Influenza-Unspecified 02/26/2017, 02/26/2018, 03/24/2022   PFIZER(Purple Top)SARS-COV-2 Vaccination 06/05/2019, 07/01/2019, 04/26/2020    Pneumococcal Conjugate-13 07/06/2020   Tdap 06/05/2014   Zoster, Live 05/25/2010    Past Medical History:  Diagnosis Date   BPH (benign prostatic hyperplasia)    CAD (coronary artery disease)    a. 04/2014 anterolateral STEMI/PCI: LM nl, LAD 72m (2.75x16 Promus DES, D1 50p, LCX nl, RI nl, OM1 100 small/chronic, RCA dominant, 20-88m, 8m/d (FFR 0.90->Med Rx), RPDA 50, RPLA 70, EF 60%.   COPD (chronic obstructive pulmonary disease) (HCC)    Dr Theotis   Former tobacco use    Gastroesophageal reflux disease    HTN (hypertension)    Hyperlipidemia    Hypokalemia    Malignant neoplasm of lung (HCC) 04/01/2018   Myocardial infarction (HCC) 04/2014   Dr Ozell Fell   Stented coronary artery     Tobacco History: Social History   Tobacco Use  Smoking Status Former   Current packs/day: 0.00   Average packs/day: 1 pack/day for 35.0 years (35.0 ttl pk-yrs)   Types: Cigarettes   Start date: 02/14/1976   Quit date: 02/14/2011   Years since quitting: 13.2  Smokeless Tobacco Never   Counseling given: Not Answered   Outpatient Medications Prior to Visit  Medication Sig Dispense Refill   acetaminophen  (TYLENOL ) 500 MG tablet Take 1,000 mg by mouth every 6 (six) hours as needed (for pain.).     albuterol  (PROAIR  HFA) 108 (90 Base) MCG/ACT inhaler Inhale 2-4 puffs into the lungs every 4 (four) hours as needed for wheezing or shortness of breath. 54 g 10   aspirin  EC 81 MG tablet Take 81 mg by mouth daily.     atorvastatin  (LIPITOR ) 80 MG tablet TAKE 1 TABLET BY MOUTH DAILY AT  6 PM 90 tablet 0   carvedilol  (COREG ) 6.25 MG tablet TAKE 1 TABLET BY MOUTH TWICE  DAILY WITH MEALS 180 tablet 3   ezetimibe  (ZETIA ) 10 MG tablet TAKE 1 TABLET BY MOUTH DAILY 15 tablet 0   famotidine  (PEPCID ) 40 MG tablet Take 40 mg by mouth daily.     Fluticasone-Umeclidin-Vilant (TRELEGY ELLIPTA ) 200-62.5-25 MCG/ACT AEPB Inhale 1 puff into the lungs daily. Rinse mouth thoroughly after every use. 28 each 6    nitroGLYCERIN  (NITROSTAT ) 0.4 MG SL tablet Dissolve 1 tablet under the tongue every 5 minutes as needed for chest pain. Max of 3 doses, then 911. 25 tablet 4   omeprazole  (PRILOSEC) 40 MG capsule Take 1 capsule (40 mg total) by mouth 2 (two) times daily. 180 capsule 1   tamsulosin  (FLOMAX ) 0.4 MG CAPS capsule Take 0.4 mg by mouth daily after supper.     ANORO ELLIPTA  62.5-25 MCG/ACT AEPB USE 1 INHALATION DAILY (Patient not taking: Reported on 05/06/2024) 180 each 3   Facility-Administered Medications Prior to Visit  Medication Dose Route Frequency Provider Last Rate Last Admin   heparin  lock flush 100 unit/mL  500 Units Intravenous Once Babara Call, MD       sodium chloride  flush (NS) 0.9 % injection 10 mL  10 mL Intravenous PRN Babara Call, MD   10 mL at 09/26/21 1125   BP 130/80   Pulse 77   Temp 97.9 F (36.6 C)   Ht 5' 8 (1.727 m)   Wt 158 lb (71.7 kg)   SpO2 95%   BMI 24.02 kg/m     Physical Examination:  General Appearance: No distress  EYES EOM intact.   NECK Supple, No JVD Pulmonary: normal  breath sounds, No wheezing.  CardiovascularNormal S1,S2.  No m/r/g.   Ext pulses intact, cap refill intact  ALL OTHER ROS ARE NEGATIVE    Assessment & Plan:   69 year old pleasant white male seen today for follow-up assessment of COPD and history of lung cancer  New subsolid right upper lobe lung mass-for diagnostic procedure with robotic bronchoscopy The Risks and Benefits of the Bronchoscopy procedure with Robotics   I have discussed the risk for Acute Bleeding, increased chance of Infection, increased chance of Respiratory Failure increased chance of pneumothorax and collapsed lung, as well as increased Stroke.  I have also explained to avoid all types of NSAIDs 1 week prior to procedure date  to decrease chance of bleeding, and also to avoid food and drinks the midnight prior to procedure.  The procedure consists of a video camera with a light source to be placed and inserted   into the lungs to  look for abnormal tissue and to obtain tissue samples by using needle and biopsy tools.  The patient  understand the risks and benefits and have agreed to proceed with procedure.  Assessment of COPD Continue  Trelegy inhaler Rinse mouth after use No exacerbation at this time No evidence of heart failure at this time No evidence or signs of infection at this time No respiratory distress No fevers, chills, nausea, vomiting, diarrhea No evidence of lower extremity edema No evidence hemoptysis   Malignant neoplasm of lower lobe of right lung (HCC) Serial CT found right upper lobe lung mass-plan for diagnostic procedure    MEDICATION ADJUSTMENTS/LABS AND TESTS ORDERED: Plan for right upper lobe diagnostic robotics bronchoscopy Case scheduled for February 9  CURRENT MEDICATIONS REVIEWED AT LENGTH WITH PATIENT TODAY   Patient  satisfied with Plan of action and management. All questions answered   Follow up 6 months   I spent a total of 48 minutes dedicated to the care of this patient on the date of this encounter to include pre-visit review of records, face-to-face time with the patient discussing conditions above, post visit ordering of testing, clinical documentation with the electronic health record, making appropriate referrals as documented, and communicating necessary information to the patient's healthcare team.    The Patient requires high complexity decision making for assessment and support, frequent evaluation and titration of therapies, application of advanced monitoring technologies and extensive interpretation of multiple databases.  Patient satisfied with Plan of action and management. All questions answered    Nickolas Alm Cellar, M.D.  [facility]  Medical Director [facility]        "

## 2024-05-15 NOTE — Telephone Encounter (Signed)
 Robotic Bronch with EBUS 06/02/2024 at 8:00am Lung Nodule [id number], [id number], [id number]  Donzell please see bronch info.  Patient is aware and bronch email has been sent.

## 2024-05-15 NOTE — Patient Instructions (Addendum)
 Plan for robotic navigation bronchoscopy case February 9 Recommend stopping aspirin  5 days prior to procedure    The Risks and Benefits of the Bronchoscopy procedure with Robotics   I have discussed the risk for Acute Bleeding, increased chance of Infection, increased chance of Respiratory Failure increased chance of pneumothorax and collapsed lung, as well as increased Stroke.  I have also explained to avoid all types of NSAIDs 1 week prior to procedure date  to decrease chance of bleeding, and also to avoid food and drinks the midnight prior to procedure.  The procedure consists of a video camera with a light source to be placed and inserted  into the lungs to  look for abnormal tissue and to obtain tissue samples by using needle and biopsy tools.  The patient/family understand the risks and benefits and have agreed to proceed with procedure.

## 2024-05-15 NOTE — Addendum Note (Signed)
 Addended byBETHA CELLAR, DAVID on: 05/15/2024 12:27 PM   Modules accepted: Level of Service

## 2024-05-16 NOTE — Telephone Encounter (Signed)
 For the codes 68372, [id number], [phone] Prior Auth Not Required Refer # [id number]

## 2024-05-16 NOTE — Telephone Encounter (Signed)
 Noted. Nothing further needed.

## 2024-05-27 ENCOUNTER — Inpatient Hospital Stay
Admission: RE | Admit: 2024-05-27 | Discharge: 2024-05-27 | Disposition: A | Source: Ambulatory Visit | Attending: Internal Medicine | Admitting: Internal Medicine

## 2024-05-27 ENCOUNTER — Other Ambulatory Visit: Payer: Self-pay

## 2024-05-27 ENCOUNTER — Telehealth (HOSPITAL_BASED_OUTPATIENT_CLINIC_OR_DEPARTMENT_OTHER): Payer: Self-pay | Admitting: *Deleted

## 2024-05-27 ENCOUNTER — Encounter: Payer: Self-pay | Admitting: Internal Medicine

## 2024-05-27 VITALS — Ht 66.0 in | Wt 158.0 lb

## 2024-05-27 DIAGNOSIS — I10 Essential (primary) hypertension: Secondary | ICD-10-CM

## 2024-05-27 DIAGNOSIS — I2109 ST elevation (STEMI) myocardial infarction involving other coronary artery of anterior wall: Secondary | ICD-10-CM

## 2024-05-27 HISTORY — DX: Unspecified osteoarthritis, unspecified site: M19.90

## 2024-05-27 NOTE — Telephone Encounter (Signed)
Patient has been scheduled for in office visit.

## 2024-05-27 NOTE — Telephone Encounter (Signed)
" ° °  Name: Timothy Yoder  DOB: 1955-11-28  MRN: [id number]  Primary Cardiologist: Ozell Fell, MD  Chart reviewed as part of pre-operative protocol coverage. Because of EROL FLANAGIN past medical history and time since last visit, he will require a follow-up in-office visit in order to better assess preoperative cardiovascular risk.  Pre-op covering staff: - Please schedule appointment and call patient to inform them. If patient already had an upcoming appointment within acceptable timeframe, please add pre-op clearance to the appointment notes so provider is aware. - Please contact requesting surgeon's office via preferred method (i.e, phone, fax) to inform them of need for appointment prior to surgery.  Regarding ASA therapy, we recommend continuation of ASA throughout the perioperative period. However, if the surgeon feels that cessation of ASA is required in the perioperative period, it may be stopped 5-7 days prior to surgery with a plan to resume it as soon as felt to be feasible from a surgical standpoint in the post-operative period.   Mardy KATHEE Pizza, FNP  05/27/2024, 3:01 PM   "

## 2024-05-27 NOTE — Patient Instructions (Addendum)
 Your procedure is scheduled on: Monday 06/02/24 Report to the Registration Desk on the 1st floor of the [facility]. To find out your arrival time, please call [phone] between 1PM - 3PM on: Friday 05/30/24 If your arrival time is 6:00 am, do not arrive before that time as the [facility] entrance doors do not open until 6:00 am.  REMEMBER: Instructions that are not followed completely may result in serious medical risk, up to and including death; or upon the discretion of your surgeon and anesthesiologist your surgery may need to be rescheduled.  Do not eat food after midnight the night before surgery.  No gum chewing or hard candies.   One week prior to surgery: Stop Anti-inflammatories (NSAIDS) such as Advil, Aleve, Ibuprofen, Motrin, Naproxen, Naprosyn and Aspirin  based products such as Excedrin, Goody's Powder, BC Powder. Stop ANY OVER THE COUNTER supplements until after surgery.  You may however, continue to take Tylenol  if needed for pain up until the day of surgery.  Stop aspirin  EC 81 MG 5 days prior to surgery as instructed by your doctor. (Patient stopped it 05/15/24) was hoping for a cancellation.   Continue taking all of your other prescription medications up until the day of surgery.  ON THE DAY OF SURGERY ONLY TAKE THESE MEDICATIONS WITH SIPS OF WATER:  carvedilol  (COREG ) 6.25 MG  famotidine  (PEPCID ) 40 MG  omeprazole  (PRILOSEC) 40 MG   Use inhalers on the day of surgery and bring to the hospital. albuterol  (PROAIR  HFA) 108 (90 Base) MCG/ACT inhaler  Fluticasone-Umeclidin-Vilant (TRELEGY ELLIPTA ) 200-62.5-25 MCG/ACT AEPB   No Alcohol for 24 hours before or after surgery.  No Smoking including e-cigarettes for 24 hours before surgery.  No chewable tobacco products for at least 6 hours before surgery.  No nicotine patches on the day of surgery.  Do not use any recreational drugs for at least a week (preferably 2 weeks) before your surgery.  Please be advised  that the combination of cocaine and anesthesia may have negative outcomes, up to and including death. If you test positive for cocaine, your surgery will be cancelled.  On the morning of surgery brush your teeth with toothpaste and water, you may rinse your mouth with mouthwash if you wish. Do not swallow any toothpaste or mouthwash.  Use CHG Soap or wipes as directed on instruction sheet.  Do not wear jewelry, make-up, hairpins, clips or nail polish.  For welded (permanent) jewelry: bracelets, anklets, waist bands, etc.  Please have this removed prior to surgery.  If it is not removed, there is a chance that hospital personnel will need to cut it off on the day of surgery.  Do not wear lotions, powders, or perfumes.   Do not shave body hair from the neck down 48 hours before surgery.  Contact lenses, hearing aids and dentures may not be worn into surgery.  Do not bring valuables to the hospital. [facility] is not responsible for any missing/lost belongings or valuables.   Notify your doctor if there is any change in your medical condition (cold, fever, infection).  Wear comfortable clothing (specific to your surgery type) to the hospital.  After surgery, you can help prevent lung complications by doing breathing exercises.  Take deep breaths and cough every 1-2 hours. Your doctor may order a device called an Incentive Spirometer to help you take deep breaths. When coughing or sneezing, hold a pillow firmly against your incision with both hands. This is called splinting. Doing this helps protect  your incision. It also decreases belly discomfort.  If you are being admitted to the hospital overnight, leave your suitcase in the car. After surgery it may be brought to your room.  In case of increased patient census, it may be necessary for you, the patient, to continue your postoperative care in the Same Day Surgery department.  If you are being discharged the day of surgery, you will  not be allowed to drive home. You will need a responsible individual to drive you home and stay with you for 24 hours after surgery.   If you are taking public transportation, you will need to have a responsible individual with you.  Please call the Pre-admissions Testing Dept. at [phone] if you have any questions about these instructions.  Surgery Visitation Policy:  Patients having surgery or a procedure may have two visitors.  Children under the age of 21 must have an adult with them who is not the patient.  Inpatient Visitation:    Visiting hours are 7 a.m. to 8 p.m. Up to four visitors are allowed at one time in a patient room. The visitors may rotate out with other people during the day.  One visitor age 32 or older may stay with the patient overnight and must be in the room by 8 p.m.   Merchandiser, Retail to address health-related social needs:  [email or web address]

## 2024-05-27 NOTE — Telephone Encounter (Signed)
"  ° °  Pre-operative Risk Assessment    Patient Name: Timothy Yoder  DOB: 1956/04/19 MRN: [id number]   Date of last office visit: 12/11/22 DR. WONDA Date of next office visit: NONE   Request for Surgical Clearance    Procedure:  VIDEO BRONCHOSCOPY WITH ENDOBRONCHIAL NAVIGATION  Date of Surgery:  Clearance 06/02/24                                Surgeon:  DR. NICKOLAS CELLAR Surgeon's Group or Practice Name:  [facility] Phone number:  [phone] AND DORISE PEREYRA, FNP [phone] Fax number: Return FAX not required. I will follow up in [facility].     Type of Clearance Requested:   - Medical  - Pharmacy:  Hold Aspirin      Type of Anesthesia:  General    Additional requests/questions:    Bonney Niels Jest   05/27/2024, 2:51 PM   "

## 2024-05-28 ENCOUNTER — Encounter: Payer: Self-pay | Admitting: Internal Medicine

## 2024-05-28 ENCOUNTER — Inpatient Hospital Stay: Admission: RE | Admit: 2024-05-28

## 2024-05-28 ENCOUNTER — Encounter: Payer: Self-pay | Admitting: Physician Assistant

## 2024-05-28 ENCOUNTER — Ambulatory Visit: Admitting: Physician Assistant

## 2024-05-28 VITALS — BP 150/92 | HR 76 | Ht 66.0 in | Wt 156.4 lb

## 2024-05-28 DIAGNOSIS — I2510 Atherosclerotic heart disease of native coronary artery without angina pectoris: Secondary | ICD-10-CM | POA: Diagnosis not present

## 2024-05-28 DIAGNOSIS — I10 Essential (primary) hypertension: Secondary | ICD-10-CM

## 2024-05-28 DIAGNOSIS — I2109 ST elevation (STEMI) myocardial infarction involving other coronary artery of anterior wall: Secondary | ICD-10-CM

## 2024-05-28 DIAGNOSIS — E785 Hyperlipidemia, unspecified: Secondary | ICD-10-CM

## 2024-05-28 DIAGNOSIS — Z01818 Encounter for other preprocedural examination: Secondary | ICD-10-CM | POA: Diagnosis not present

## 2024-05-28 DIAGNOSIS — Z9861 Coronary angioplasty status: Secondary | ICD-10-CM | POA: Diagnosis not present

## 2024-05-28 DIAGNOSIS — C3431 Malignant neoplasm of lower lobe, right bronchus or lung: Secondary | ICD-10-CM | POA: Diagnosis not present

## 2024-05-28 MED ORDER — AMLODIPINE BESYLATE 5 MG PO TABS: 2.5000 mg | ORAL_TABLET | Freq: Every day | ORAL | 3 refills | Status: AC

## 2024-05-28 NOTE — Progress Notes (Signed)
 " Perioperative / Anesthesia Services  Pre-Admission Testing Clinical Review / Pre-Operative Anesthesia Consult  Date: 05/29/24  PATIENT DEMOGRAPHICS: Name: Timothy Yoder DOB: March 27, 1956 MRN:   [id number]  Note: Available PAT nursing documentation and vital signs have been reviewed. Clinical nursing staff has updated patient's PMH/PSHx, current medication list, and drug allergies/intolerances to ensure complete and comprehensive history available to assist care teams in MDM as it pertains to the aforementioned surgical procedure and anticipated anesthetic course. Extensive review of available clinical information personally performed. Nursing documentation reviewed. [place] PMH and PSHx updated with any diagnoses and/or procedures that I have knowledge of that may have been inadvertently omitted during his intake with the pre-admission testing department's nursing staff.  PLANNED SURGICAL PROCEDURE(S):   Case: [id number] Date/Time: 06/02/24 0745   Procedure: VIDEO BRONCHOSCOPY WITH ENDOBRONCHIAL NAVIGATION (Right)   Anesthesia type: General   Diagnosis: Mass of upper lobe of right lung [R91.8]   Pre-op diagnosis: lung mass RT lung   Location: [facility] PROCEDURE RM 02 / [facility] ORS FOR ANESTHESIA GROUP   Surgeons: Isaiah Scrivener, MD        CLINICAL DISCUSSION: Timothy Yoder is a 69 y.o. male who is submitted for pre-surgical anesthesia review and clearance prior to him undergoing the above procedure. Patient is a Former Smoker (35 pack years; quit 01/2011). Pertinent PMH includes: CAD, anterolateral STEMI, aortic atherosclerosis, HTN, HLD, RLL lung cancer (s/p chemoradiation), RUL pulmonary mass, COPD, GERD (on daily PPI + H2 blocker), BPH, OA.  Patient is followed by cardiology Corlis, MD). He was last seen in the cardiology clinic on 05/28/2024; notes reviewed. At the time of his clinic visit, patient with complaints of chronic exertional dyspnea that he notes has worsened over the course of  the last year.  Patient denied any chest pain, PND, orthopnea, palpitations, significant peripheral edema, weakness, fatigue, vertiginous symptoms, or presyncope/syncope. Patient with a past medical history significant for cardiovascular diagnoses. Documented physical exam was grossly benign, providing no evidence of acute exacerbation and/or decompensation of the patient's known cardiovascular conditions.  Patient suffered an anterolateral STEMI on 05/03/2014.  Diagnostic LEFT heart catheterization was performed revealing multivessel CAD: 50% D1, 85% mid LAD, 20-30% mid RCA, 70% mid-distal RCA, 50% mid PDA and 70% at the junction of the posterior AV segment and PLB-2.  FFR analysis of the mid RCA was normal at 0.90.  PCI was subsequently performed placing a 2.75 x 16 mm Promus DES x 1 to the mid LAD lesion.  Procedure yielded excellent angiographic result and TIMI-3 flow.  Most recent myocardial perfusion imaging study was performed on 02/10/2016 revealing a normal left ventricular systolic function with an EF of 55 to 65% -65%.  There were no regional wall motion abnormalities.  No artifact or left ventricular cavity size enlargement appreciated on review of imaging. SPECT images demonstrated no evidence of stress-induced myocardial ischemia or arrhythmia; no scintigraphic evidence of scar.  TID ratio = 1.02 (normal range </= 1.2). Study determined to be normal and low risk.  Most recent TTE performed on 09/01/2019 revealed a normal left ventricular systolic function with an EF of 55-60%. There were no regional wall motion abnormalities. Left ventricular diastolic Doppler parameters consistent with abnormal relaxation (G1DD). Right ventricular size and function normal with a TAPSE measuring 2.0 cm  (normal range >/= 1.6 cm).  There was no significant valvular regurgitation.  All transvalvular gradients were noted to be normal providing no evidence of hemodynamically significant valvular stenosis. Aorta  normal in size  with no evidence of ectasia or aneurysmal dilatation.  Blood pressure elevated at 150/92 mmHg on currently prescribed CCB (amlodipine ) and beta-blocker (carvedilol ) therapies.  Patient is on atorvastatin  + ezetimibe  for his HLD diagnosis and ASCVD prevention.  Patient has a supply of short acting nitrates (NTG) to use on an as needed basis for recurrent angina/anginal equivalent symptoms; denied recent use.  Patient is not diabetic. He does not have an OSAH diagnosis. Patient is able to complete all of his  ADL/IADLs without cardiovascular limitation.  Per the DAS[facility], patient is able to achieve at least 4 METS of physical activity without experiencing any significant degree of angina/anginal equivalent symptoms. No changes were made to his medication regimen during his visit with cardiology.  Patient scheduled to follow-up with outpatient cardiology in 6 months or sooner if needed.  Timothy Yoder with a history of NSCLC dating back to December 2019.  Malignancy was in the RLL and was treated with systemic chemotherapy and XRT.  Patient underwent CT imaging of the chest on 04/25/2024 revealing a new subsolid irregular lesion in the RUL measuring a maximum of 16 mm.  Area favored to reflect an area of scarring given patient's underlying emphysema, however given his history of malignancy, possibility of a new adenocarcinoma cannot be excluded.  Patient was referred to pulmonary medicine for further evaluation and discussions regarding  tissue biopsy needed for diagnostic purposes.  Patient subsequently scheduled for a VIDEO BRONCHOSCOPY WITH ENDOBRONCHIAL NAVIGATION (Right) on 06/02/2024 with Dr. Nickolas Cellar, MD.   Given patient's past medical history significant for cardiovascular diagnoses, presurgical cardiac clearance was sought by the PAT team. Per cardiology, given past medical history and time since last visit, based on ACC/AHA guidelines, Timothy Yoder is at [facility] risk for the  planned procedure without further cardiovascular testing.  We have ordered echocardiogram, however he does not need echocardiogram prior to the upcoming procedure.  In review of the patient's medication reconciliation, it is noted that he is on daily oral antithrombotic therapy. He has been instructed on recommendations for holding his daily low-dose ASA for 5 days prior to his procedure with plans to restart as soon as postoperative bleeding risk felt to be minimized by his primary attending surgeon.  Hoping that there would be a cancellation on the surgeons schedule, patient preemptively stopped his aspirin  on 05/15/2024 in preparation for his procedure.  Patient denies previous perioperative complications with anesthesia in the past. In review his EMR, it is noted that patient underwent a general anesthetic course here at [facility] (ASA III) in 11/2020 without documented complications.   MOST RECENT VITAL SIGNS:    05/28/2024    8:34 AM 05/27/2024    2:13 PM 05/15/2024   10:03 AM  Vitals with BMI  Height 5' 6 5' 6 5' 8  Weight 156 lbs 6 oz 158 lbs 158 lbs  BMI 25.26 25.51 24.03  Systolic 150  130  Diastolic 92  80  Pulse 76  77   PROVIDERS/SPECIALISTS: NOTE: Primary physician provider listed below. Patient may have been seen by APP or partner within same practice.   PROVIDER ROLE / SPECIALTY LAST Timothy Cellar Nickolas, MD Pulmonary Medicine (Surgeon) 05/15/2024  Timothy Norleen BIRCH, MD Primary Care Provider 02/07/2024  Timothy Sharper, MD  Cardiology 05/28/2024   Timothy Call, MD Medical Oncology 05/06/2024   ALLERGIES: Patient has no known allergies.  CURRENT HOME MEDICATIONS:  acetaminophen  (TYLENOL ) 500 MG tablet   albuterol  (PROAIR  HFA)  108 (90 Base) MCG/ACT inhaler   amLODipine  (NORVASC ) 5 MG tablet   aspirin  EC 81 MG tablet   atorvastatin  (LIPITOR ) 80 MG tablet   carvedilol  (COREG ) 6.25 MG tablet   ezetimibe  (ZETIA ) 10 MG tablet   famotidine   (PEPCID ) 40 MG tablet   Fluticasone-Umeclidin-Vilant (TRELEGY ELLIPTA ) 200-62.5-25 MCG/ACT AEPB   nitroGLYCERIN  (NITROSTAT ) 0.4 MG SL tablet   omeprazole  (PRILOSEC) 40 MG capsule   tamsulosin  (FLOMAX ) 0.4 MG CAPS capsule    heparin  lock flush 100 unit/mL   sodium chloride  flush (NS) 0.9 % injection 10 mL   HISTORY: Past Medical History:  Diagnosis Date   Aortic atherosclerosis    Arthritis    Bilateral inguinal hernia    BPH (benign prostatic hyperplasia)    CAD (coronary artery disease)    a. 04/2014 anterolateral STEMI/PCI: LM nl, LAD 66m (2.75x16 Promus DES, D1 50p, LCX nl, RI nl, OM1 100 small/chronic, RCA dominant, 20-14m, 53m/d (FFR 0.90->Med Rx), RPDA 50, RPLA 70, EF 60%.   Cancer of lower lobe of right lung (HCC) 04/01/2018   a.) NSCLC s/p Tx with systemic chemotherapy + radiation   COPD (chronic obstructive pulmonary disease) (HCC)    Diverticulosis    Former tobacco use    Gastroesophageal reflux disease    HTN (hypertension)    Hyperlipidemia    Long-term use of aspirin  therapy    Mass of upper lobe of right lung    ST elevation myocardial infarction (STEMI) of anterolateral wall (HCC) 04/2014   Past Surgical History:  Procedure Laterality Date   CHOLECYSTECTOMY N/A 02/15/2016   Procedure: LAPAROSCOPIC CHOLECYSTECTOMY WITH INTRAOPERATIVE CHOLANGIOGRAM;  Surgeon: Larinda Unknown Sharps, MD;  Location: [facility] ORS;  Service: General;  Laterality: N/A;   COLONOSCOPY     COLONOSCOPY WITH PROPOFOL  N/A 08/02/2015   Procedure: COLONOSCOPY WITH PROPOFOL ;  Surgeon: Lamar ONEIDA Holmes, MD;  Location: [facility] ENDOSCOPY;  Service: Endoscopy;  Laterality: N/A;   COLONOSCOPY WITH PROPOFOL  N/A 11/29/2020   Procedure: COLONOSCOPY WITH PROPOFOL ;  Surgeon: Maryruth Ole ONEIDA, MD;  Location: [facility] ENDOSCOPY;  Service: Endoscopy;  Laterality: N/A;   CORONARY ANGIOPLASTY WITH STENT PLACEMENT Left 05/03/2014   ENDOBRONCHIAL ULTRASOUND N/A 03/26/2018   Procedure: ENDOBRONCHIAL ULTRASOUND;  Surgeon:  Isaiah Scrivener, MD;  Location: [facility] ORS;  Service: Cardiopulmonary;  Laterality: N/A;   ESOPHAGOGASTRODUODENOSCOPY (EGD) WITH PROPOFOL  N/A 08/02/2015   Procedure: ESOPHAGOGASTRODUODENOSCOPY (EGD) WITH PROPOFOL ;  Surgeon: Lamar ONEIDA Holmes, MD;  Location: [facility] ENDOSCOPY;  Service: Endoscopy;  Laterality: N/A;   ESOPHAGOGASTRODUODENOSCOPY (EGD) WITH PROPOFOL  N/A 11/29/2020   Procedure: ESOPHAGOGASTRODUODENOSCOPY (EGD) WITH PROPOFOL ;  Surgeon: Maryruth Ole ONEIDA, MD;  Location: [facility] ENDOSCOPY;  Service: Endoscopy;  Laterality: N/A;   FLEXIBLE BRONCHOSCOPY N/A 03/26/2018   Procedure: FLEXIBLE BRONCHOSCOPY;  Surgeon: Isaiah Scrivener, MD;  Location: [facility] ORS;  Service: Cardiopulmonary;  Laterality: N/A;   IR REMOVAL TUN ACCESS W/ PORT W/O FL MOD SED  04/20/2022   KNEE ARTHROSCOPY Left    LEFT HEART CATHETERIZATION WITH CORONARY ANGIOGRAM N/A 05/03/2014   Procedure: LEFT HEART CATHETERIZATION WITH CORONARY ANGIOGRAM;  Surgeon: Ozell JONETTA Fell, MD;  Location: [facility] CATH LAB;  Service: Cardiovascular;  Laterality: N/A;   PERCUTANEOUS CORONARY STENT INTERVENTION (PCI-S) N/A 05/05/2014   Procedure: PERCUTANEOUS CORONARY STENT INTERVENTION (PCI-S);  Surgeon: Peter M Jordan, MD;  Location: [facility] CATH LAB;  Service: Cardiovascular;  Laterality: N/A;   PORT-A-CATH REMOVAL  04/20/2022   PORTACATH PLACEMENT Left 04/04/2018   Procedure: INSERTION PORT-A-CATH;  Surgeon: Volney Lye, MD;  Location: [facility] ORS;  Service:  General;  Laterality: Left;   UPPER GI ENDOSCOPY     VASECTOMY  1984   Family History  Problem Relation Age of Onset   CAD Mother 93       Died of an MI   CAD Father 64       Died of a massive MI   COPD Sister    Lung cancer Brother    Cancer Brother    Bone cancer Paternal Uncle    Cancer Brother    Colon cancer Neg Hx    Breast cancer Neg Hx    Social History   Tobacco Use   Smoking status: Former    Current packs/day: 0.00    Average packs/day: 1 pack/day for 35.0 years (35.0 ttl pk-yrs)     Types: Cigarettes    Start date: 02/14/1976    Quit date: 02/14/2011    Years since quitting: 13.2   Smokeless tobacco: Never  Substance Use Topics   Alcohol use: No   LABS:  Lab Results  Component Value Date   NA 141 04/25/2024   CL 105 04/25/2024   K 4.0 04/25/2024   CO2 26 04/25/2024   BUN 11 04/25/2024   CREATININE 1.10 04/25/2024   GFRNONAA >60 04/25/2024   CALCIUM  9.1 04/25/2024   ALBUMIN 4.1 04/25/2024   GLUCOSE 99 04/25/2024   ECG: Date: 05/28/2024  Time ECG obtained: 0840 PM Rate: 76 bpm Rhythm: normal sinus Axis (leads I and aVF): normal Intervals: PR 160 ms. QRS 80 ms. QTc 441 ms. ST segment and T wave changes: Lateral T wave inversions Evidence of a possible, age undetermined, prior infarct:  No Comparison: Similar to previous tracing obtained on 12/11/2022   IMAGING / PROCEDURES: CT CHEST ABDOMEN PELVIS W CONTRAST performed on 04/25/2024 Stable treatment related fibrosis in the right lower lobe. No findings to suggest recurrent tumor. New sub solid irregular lesion in the right upper lobe. This measures a maximum of 16 mm. It may reflect an area of new scarring change given the underlying emphysema but could not exclude the possibility of new adenocarcinoma. Recommend either close CT surveillance (repeat noncontrast chest CT in 3-4 months) or PET-CT. No mediastinal or hilar mass or adenopathy. No findings for abdominal/pelvic metastatic disease. Status post cholecystectomy. No biliary dilatation Diffuse colonic diverticulosis without findings for acute diverticulitis. Aortic atherosclerosis  Emphysema   TRANSTHORACIC ECHOCARDIOGRAM performed on 09/01/2019 Left ventricular ejection fraction, by estimation, is 55 to 60%. The  left ventricle has normal function. The left ventricle has no regional  wall motion abnormalities. Left ventricular diastolic parameters were  normal.  Right ventricular systolic function is normal. The right ventricular size is  normal.  The mitral valve is normal in structure. Trivial mitral valve regurgitation. No evidence of mitral stenosis.  The aortic valve is normal in structure. Aortic valve regurgitation is trivial. No aortic stenosis is present.  The inferior vena cava is normal in size with greater than 50% respiratory variability, suggesting right atrial pressure of 3 mmHg.  MYOCARDIAL PERFUSION IMAGING STUDY (LEXISCAN) performed on 02/10/2016 Blood pressure demonstrated a hypertensive response to exercise. No T wave inversion was noted during stress. The left ventricular ejection fraction is normal (55-65%). The study is normal and low risk.  IMPRESSION AND PLAN: Timothy Yoder has been referred for pre-anesthesia review and clearance prior to him undergoing the planned anesthetic and procedural courses. Available labs, pertinent testing, and imaging results were personally reviewed by me in preparation for upcoming operative/procedural course. [facility] medical record has been updated following extensive record review and patient interview with PAT staff.  Encounter with basket was AND I did not see it in the morning for 16 cm depression.  His wife Will need preoperative CBC prior to procedure. Order is in place.   This patient has been appropriately cleared by cardiology with an overall ACCEPTABLE risk of patient experiencing significant perioperative cardiovascular complications. here at [facility]. Based on clinical review performed today (05/29/24), barring any significant acute changes in the patient's overall condition, it is anticipated that he will be able to proceed with the planned surgical intervention. Any acute changes in clinical condition may necessitate his procedure being postponed and/or cancelled. Patient will meet with anesthesia team (MD and/or CRNA) on the day of his procedure for preoperative evaluation/assessment. Questions regarding anesthetic course  will be fielded at that time.   Pre-surgical instructions were reviewed with the patient during his PAT appointment, and questions were fielded to satisfaction by PAT clinical staff. He has been instructed on which medications that he will need to hold prior to surgery, as well as the ones that have been deemed safe/appropriate to take on the day of his procedure. As part of the general education provided by PAT, patient made aware both verbally and in writing, that he would need to abstain from the use of any illegal substances during his perioperative course. He was advised that failure to follow the provided instructions could necessitate case cancellation or result in serious perioperative complications up to and including death. Patient encouraged to contact PAT and/or his surgeon's office to discuss any questions or concerns that may arise prior to surgery; verbalized understanding.   Dorise Pereyra, MSN, APRN, FNP-C, CEN [facility]  Perioperative Services Nurse Practitioner Phone: [phone] Fax: [phone] 05/29/24 12:20 PM  NOTE: This note has been prepared using Dragon dictation software. Despite my best ability to proofread, there is always the potential that unintentional transcriptional errors may still occur from this process. "

## 2024-05-28 NOTE — Progress Notes (Unsigned)
 " Cardiology Office Note   Date:  05/29/2024  ID:  Alecxis, Baltzell [birth year], MRN [id number] PCP: Rudolpho Norleen BIRCH, MD  [facility] Providers Cardiologist:  Ozell Fell, MD     History of Present Illness CARMINO OCAIN is a 69 y.o. male with past medical history of CAD, RLL non-small cell lung cancer s/p chemo and radiation therapy, COPD, hypertension, hyperlipidemia and former tobacco use.  Patient initially presented with anterolateral STEMI on 05/03/2014 treated with drug-eluting stent to mid LAD, EF at the time was 60%.  He did have moderate mid RCA residual (FFR 0.90) that was medically managed.  Myoview  obtained in October 2017 was normal with EF 55 to 65%, no ischemia or infarction.  Most recent echocardiogram obtained on 09/01/2019 showed EF 55 to 60%, no regional wall motion abnormality, normal RV, trivial MR.  PFT in June 2021 showed moderate to severe obstructive airway disease without significant bronchodilator response.  There was evidence of air trapping and hyperinflation.  He was last seen by Dr. Fell in August 2024 at which time his functional ability was limited by shortness of breath related to COPD during moderate exertion.  1 year follow-up was recommended.  Most recent CT of the chest abdomen and pelvis obtained on 04/25/2024 showed stable treatment related fibrosis of the right lower lobe, new subsolid irregular lesion in the right upper lobe with measuring 16 mm which may reflect an area of new scarring but could not exclude the possibility of new adenocarcinoma, there is also finding of emphysema.  No abdominal or pelvic metastatic disease.  He has been seen by both oncology and pulmonology service.  Pulmonology service recommended a video bronchoscopy.    Patient presents today for cardiac clearance.  He has not been seen in the cardiology office for more than a year.  He has chronic dyspnea with exertion.  Symptom does seem to have slightly worsened over the past  year.  Talking with the patient, when he he initially had his MI, he had clear chest pain.  He denies any recent chest discomfort with exertion.  EKG does show new T wave inversion in the lateral leads.  Blood pressure has also been running high in the 140s at home.  I will add at 2.5 mg daily of amlodipine  to his medical regimen.  I reviewed the EKG with DOD Dr. Verlin.  Given lack of chest discomfort, he should be at acceptable risk to proceed with upcoming bronchoscopy procedure.  We will obtain an echocardiogram after the bronchoscopy procedure to make sure his EF is normal and there is no wall motion abnormality.  Amlodipine  will also serve as an antianginal medication as well.  We plan to reassess the patient in 3 to 4 months, if he does have worsening symptoms, we will consider a PET stress test at that time.  Otherwise, he also mention he would follow up with his PCP regarding annual lipid panel.  I did review the current guideline regarding cholesterol control with the patient, he is aware his LDL goal should be less than 55.  Last lipid panel in our system was dating back to 2022, his LDL did jump from 40s from the previous year to 2s.  He is currently atorvastatin  80 mg daily and Zetia  10 mg daily.  ROS:   Patient has dyspnea on exertion which is chronic, he denies any chest pain.  He has no lower extremity edema, orthopnea or PND.  Studies Reviewed EKG Interpretation Date/Time:  Wednesday May 28 2024 08:40:16 EST Ventricular Rate:  76 PR Interval:  160 QRS Duration:  80 QT Interval:  392 QTC Calculation: 441 R Axis:   42  Text Interpretation: Normal sinus rhythm T wave abnormality, consider lateral ischemia When compared with ECG of 11-Dec-2022 07:58, Nonspecific T wave abnormality now evident in Inferior leads Inverted T waves have replaced nonspecific T wave abnormality in Lateral leads Confirmed by Janene Boer [id number]) on 05/29/2024 7:53:48 PM    Cath 05/03/2024 PROCEDURAL  FINDINGS Hemodynamics: AO 112/74 LV 110/11              Coronary angiography: Coronary dominance: right   Left mainstem: Left main is widely patent without stenosis. Divides into the LAD and left circumflex.   Left anterior descending (LAD): The LAD is diffusely diseased. It reaches the LV apex. The first diagonal has a 50% stenosis in its proximal body. The mid LAD has a 85% stenosis. I suspected this was the patient's culprit lesion based on location of injury current on EKG.   Left circumflex (LCx): The circumflex is patent. The ramus intermedius branches are fairly large and it divides into twin vessels. There is no significant stenosis. The first obtuse marginal is totally occluded. The vessel is very small and they occlusion has the appearance of a chronic occlusion.   Right coronary artery (RCA): Large, dominant vessel. The vessel has diffuse plaquing. The mid vessel has 20-30% stenosis. At the junction of the mid and distal vessel there is a 70% hypodense lesion. The PDA is a large branching and has an eccentric 50% stenosis in its mid body. The PLA is a smaller branch and it has a 70% stenosis at the junction of the posterior AV segment and the second PLA branch.   Left ventriculography: There is mild hypokinesis of the LV apex. The other LV wall segments contract vigorously. The estimated LVEF is 60%.   PCI Note:  Following the diagnostic procedure, the decision was made to proceed with PCI of the mid LAD.  Weight-based bivalirudin  was given for anticoagulation. Once a therapeutic ACT was achieved, a 6 French XB LAD guide catheter was inserted.  A cougar coronary guidewire was used to cross the lesion.  The lesion was predilated with a 2.5 x 12 mm balloon.  The lesion was then stented with a 2.75 x 16 mm Promus DES.  The stent was postdilated with a 2.75 mm noncompliant balloon to 18 atm.  Following PCI, there was 0% residual stenosis and TIMI-3 flow. Final angiography confirmed an  excellent result. The patient tolerated the procedure well. There were no immediate procedural complications. A TR band was used for radial hemostasis. The patient was transferred to the post catheterization recovery area for further monitoring.   PCI Data: Vessel - LAD/Segment - mid Percent Stenosis (pre)  85 TIMI-flow 3 Stent 2.75x16 mm Promus DES Percent Stenosis (post) 0 TIMI-flow (post) 3   Contrast: 155 cc   Radiation dose/Fluoro time: 6.5 minutes   Estimated Blood Loss: minimal   Final Conclusions:   1. Acute anterior wall MI secondary to severe LAD stenosis. Suspect reperfusion occurred after heparin  and brilinta  was administered. 2. 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 3. 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 4. Mild contraction abnormality of the left ventricle with preserved overall LV function.    Recommendations:  Dual antiplatelet therapy with aspirin  and brilinta  for at least  12 months. Post MI medical therapy. Consider staged PCI +/- FFR of the mid-RCA. Complete Trial would be a reasonable consideration.    Relook cath 05/05/2014 Procedure: FFR of the RCA   Indication: 69 yo WM presented with an anterior MI treated with stenting of the LAD. There was an eccentric 70% lesion in the mid RCA. FFR planned to decide functional significance.   Procedural Details: The right wrist was prepped, draped, and anesthetized with 1% lidocaine . Using the modified Seldinger technique, a 6 Fr slender sheath was introduced into the radial artery. 3 mg verapamil  was administered through the radial sheath. Weight-based heparin  was given for anticoagulation. Once a therapeutic ACT was achieved, a 6 French FR4 guide catheter was inserted.  A Flow wire was used to cross the lesion.  The patient was given IV Adenosine  infusion per protocol for maximal hyperemia. FFR was measured at 0.90. This  indicates that the lesion is not hemodynamically significant. The patient tolerated the procedure well. There were no immediate procedural complications. A TR band was used for radial hemostasis. The patient was transferred to the post catheterization recovery area for further monitoring.   Conclusions: FFR of 0.90 of the mid RCA   Recommendations: medical management.   Peter Jordan, MDFACC 05/05/2014, 4:22 PM    Echo 09/01/2019  1. Left ventricular ejection fraction, by estimation, is 55 to 60%. The  left ventricle has normal function. The left ventricle has no regional  wall motion abnormalities. Left ventricular diastolic parameters were  normal.   2. Right ventricular systolic function is normal. The right ventricular  size is normal.   3. The mitral valve is normal in structure. Trivial mitral valve  regurgitation. No evidence of mitral stenosis.   4. The aortic valve is normal in structure. Aortic valve regurgitation is  trivial. No aortic stenosis is present.   5. The inferior vena cava is normal in size with greater than 50%  respiratory variability, suggesting right atrial pressure of 3 mmHg.    Risk Assessment/Calculations          Physical Exam VS:  BP (!) 150/92 (BP Location: Left Arm, Patient Position: Sitting, Cuff Size: Normal)   Pulse 76   Ht 5' 6 (1.676 m)   Wt 156 lb 6.4 oz (70.9 kg)   SpO2 96%   BMI 25.24 kg/m        Wt Readings from Last 3 Encounters:  05/28/24 156 lb 6.4 oz (70.9 kg)  05/27/24 158 lb (71.7 kg)  05/15/24 158 lb (71.7 kg)    GEN: Well nourished, well developed in no acute distress NECK: No JVD; No carotid bruits CARDIAC: RRR, no murmurs, rubs, gallops RESPIRATORY:  Clear to auscultation without rales, wheezing or rhonchi  ABDOMEN: Soft, non-tender, non-distended EXTREMITIES:  No edema; No deformity   ASSESSMENT AND PLAN  Preoperative clearance  - Upcoming video bronchoscopy.  Patient denies any chest pain.  EKG does show new  lateral T wave inversion.  Case discussed with DOD Dr. Verlin, given lack of chest discomfort, patient was felt to be at acceptable risk to proceed with her upcoming video bronchoscopy.  He is already holding aspirin .  CAD: Prior history of anterolateral STEMI in January 2016 treated with drug-eluting stent to mid LAD.  He had moderate RCA residual.  EKG today showed new lateral T wave inversion.  Will obtain echocardiogram.  Patient adamantly denies any chest pain.  Will reassess in 3 to 4 months, low threshold of considering PET stress test.  Hypertension: Blood pressure is elevated, add amlodipine  2.5 mg daily  Hyperlipidemia: On Zetia  and atorvastatin .  LDL goal less than 55  History of lung cancer: Followed by oncology and pulmonology service.  Recent CT image showed a new subsolid irregular lesion in the right upper lobe measuring 16 mm.  Pending upcoming video bronchoscopy.         Dispo: Follow-up in 3 to 4 months  Signed, Rajvir Ernster, PA  "

## 2024-05-28 NOTE — Telephone Encounter (Signed)
"  ° °  Patient Name: Timothy Yoder  DOB: [birth year] MRN: [id number]  Primary Cardiologist: Ozell Fell, MD  Chart reviewed as part of pre-operative protocol coverage. Given past medical history and time since last visit, based on ACC/AHA guidelines, Timothy Yoder is at acceptable risk for the planned procedure without further cardiovascular testing.  We have ordered echocardiogram, however he does not need echocardiogram prior to the upcoming procedure.  If needed, he may hold aspirin  for 5 days prior to the procedure and resume as soon as possible afterward at the discretion of the pulmonologist who does the procedure.  The patient was advised that if he develops new symptoms prior to surgery to contact our office to arrange for a follow-up visit, and he verbalized understanding.  I will route this recommendation to the requesting party via Epic fax function and remove from pre-op pool.  Please call with questions.  Timothy Yoder, Timothy Yoder 05/28/2024, 9:25 AM  "

## 2024-05-28 NOTE — Patient Instructions (Signed)
 Medication Instructions:  START AMLODIPINE  2.5 MG DAILY *If you need a refill on your cardiac medications before your next appointment, please call your pharmacy*  Lab Work: NO LABS If you have labs (blood work) drawn today and your tests are completely normal, you will receive your results only by: MyChart Message (if you have MyChart) OR A paper copy in the mail If you have any lab test that is abnormal or we need to change your treatment, we will call you to review the results.  Testing/Procedures:[address] Your physician has requested that you have an echocardiogram. Echocardiography is a painless test that uses sound waves to create images of your heart. It provides your doctor with information about the size and shape of your heart and how well your hearts chambers and valves are working. This procedure takes approximately one hour. There are no restrictions for this procedure. Please do NOT wear cologne, perfume, aftershave, or lotions (deodorant is allowed). Please arrive 15 minutes prior to your appointment time.  Please note: We ask at that you not bring children with you during ultrasound (echo/ vascular) testing. Due to room size and safety concerns, children are not allowed in the ultrasound rooms during exams. Our front office staff cannot provide observation of children in our lobby area while testing is being conducted. An adult accompanying a patient to their appointment will only be allowed in the ultrasound room at the discretion of the ultrasound technician under special circumstances. We apologize for any inconvenience.   Follow-Up: At [facility], you and your health needs are our priority.  As part of our continuing mission to provide you with exceptional heart care, our providers are all part of one team.  This team includes your primary Cardiologist (physician) and Advanced Practice Providers or APPs (Physician Assistants and Nurse Practitioners) who all  work together to provide you with the care you need, when you need it.  Your next appointment:   3-4 month(s)  Provider:   Ozell Fell, MD ONLY

## 2024-05-29 ENCOUNTER — Other Ambulatory Visit: Payer: Self-pay | Admitting: Internal Medicine

## 2024-05-29 DIAGNOSIS — J449 Chronic obstructive pulmonary disease, unspecified: Secondary | ICD-10-CM

## 2024-05-30 ENCOUNTER — Ambulatory Visit: Admission: RE | Admit: 2024-05-30 | Source: Ambulatory Visit

## 2024-05-30 DIAGNOSIS — R918 Other nonspecific abnormal finding of lung field: Secondary | ICD-10-CM

## 2024-06-02 ENCOUNTER — Encounter: Payer: Self-pay | Admitting: Urgent Care

## 2024-06-02 ENCOUNTER — Encounter: Admission: RE | Payer: Self-pay | Source: Home / Self Care

## 2024-06-02 ENCOUNTER — Ambulatory Visit: Admission: RE | Admit: 2024-06-02 | Source: Home / Self Care | Admitting: Internal Medicine

## 2024-06-02 DIAGNOSIS — R918 Other nonspecific abnormal finding of lung field: Secondary | ICD-10-CM

## 2024-06-02 DIAGNOSIS — R7401 Elevation of levels of liver transaminase levels: Secondary | ICD-10-CM

## 2024-06-02 DIAGNOSIS — Z01812 Encounter for preprocedural laboratory examination: Secondary | ICD-10-CM

## 2024-06-02 DIAGNOSIS — I2510 Atherosclerotic heart disease of native coronary artery without angina pectoris: Secondary | ICD-10-CM

## 2024-06-02 DIAGNOSIS — E876 Hypokalemia: Secondary | ICD-10-CM

## 2024-06-02 DIAGNOSIS — R748 Abnormal levels of other serum enzymes: Secondary | ICD-10-CM

## 2024-06-02 HISTORY — DX: Diverticulosis of intestine, part unspecified, without perforation or abscess without bleeding: K57.90

## 2024-06-02 HISTORY — DX: Long term (current) use of aspirin: Z79.82

## 2024-06-02 HISTORY — DX: Atherosclerosis of aorta: I70.0

## 2024-06-02 HISTORY — DX: Bilateral inguinal hernia, without obstruction or gangrene, not specified as recurrent: K40.20

## 2024-06-02 HISTORY — DX: Other nonspecific abnormal finding of lung field: R91.8

## 2024-06-02 SURGERY — VIDEO BRONCHOSCOPY WITH ENDOBRONCHIAL NAVIGATION
Anesthesia: General | Laterality: Right

## 2024-06-27 ENCOUNTER — Ambulatory Visit

## 2024-07-08 ENCOUNTER — Ambulatory Visit (HOSPITAL_COMMUNITY)

## 2024-08-04 ENCOUNTER — Other Ambulatory Visit

## 2024-08-11 ENCOUNTER — Inpatient Hospital Stay: Admitting: Oncology

## 2024-08-19 ENCOUNTER — Ambulatory Visit: Admitting: Internal Medicine

## 2024-10-16 ENCOUNTER — Ambulatory Visit: Admitting: Cardiovascular Disease
